# Patient Record
Sex: Female | Born: 1945 | Race: White | Hispanic: No | Marital: Single | Smoking: Former smoker
Health system: Southern US, Community
[De-identification: ages and names within clinical notes are randomized; demographics above are authoritative.]

## PROBLEM LIST (undated history)

## (undated) ENCOUNTER — Ambulatory Visit: Admission: EM | Payer: PPO | Source: Home / Self Care

## (undated) DIAGNOSIS — N3945 Continuous leakage: Secondary | ICD-10-CM

## (undated) DIAGNOSIS — C44310 Basal cell carcinoma of skin of unspecified parts of face: Secondary | ICD-10-CM

## (undated) DIAGNOSIS — E7800 Pure hypercholesterolemia, unspecified: Secondary | ICD-10-CM

## (undated) DIAGNOSIS — K219 Gastro-esophageal reflux disease without esophagitis: Secondary | ICD-10-CM

## (undated) DIAGNOSIS — H3530 Unspecified macular degeneration: Secondary | ICD-10-CM

## (undated) DIAGNOSIS — D649 Anemia, unspecified: Secondary | ICD-10-CM

## (undated) DIAGNOSIS — F502 Bulimia nervosa, unspecified: Secondary | ICD-10-CM

## (undated) DIAGNOSIS — M545 Low back pain, unspecified: Secondary | ICD-10-CM

## (undated) DIAGNOSIS — N183 Chronic kidney disease, stage 3 (moderate): Secondary | ICD-10-CM

## (undated) DIAGNOSIS — M1990 Unspecified osteoarthritis, unspecified site: Secondary | ICD-10-CM

## (undated) DIAGNOSIS — M810 Age-related osteoporosis without current pathological fracture: Secondary | ICD-10-CM

## (undated) DIAGNOSIS — Z972 Presence of dental prosthetic device (complete) (partial): Secondary | ICD-10-CM

## (undated) DIAGNOSIS — Z87891 Personal history of nicotine dependence: Principal | ICD-10-CM

## (undated) DIAGNOSIS — F332 Major depressive disorder, recurrent severe without psychotic features: Secondary | ICD-10-CM

## (undated) HISTORY — DX: Low back pain: M54.5

## (undated) HISTORY — DX: Bulimia nervosa: F50.2

## (undated) HISTORY — DX: Low back pain, unspecified: M54.50

## (undated) HISTORY — PX: TUBAL LIGATION: SHX77

## (undated) HISTORY — PX: CATARACT EXTRACTION: SUR2

## (undated) HISTORY — DX: Continuous leakage: N39.45

## (undated) HISTORY — DX: Age-related osteoporosis without current pathological fracture: M81.0

## (undated) HISTORY — DX: Basal cell carcinoma of skin of unspecified parts of face: C44.310

## (undated) HISTORY — DX: Personal history of nicotine dependence: Z87.891

## (undated) HISTORY — DX: Chronic kidney disease, stage 3 (moderate): N18.3

## (undated) HISTORY — DX: Unspecified macular degeneration: H35.30

## (undated) HISTORY — PX: CARPAL TUNNEL RELEASE: SHX101

## (undated) HISTORY — DX: Bulimia nervosa, unspecified: F50.20

## (undated) HISTORY — DX: Major depressive disorder, recurrent severe without psychotic features: F33.2

---

## 1975-09-29 HISTORY — PX: GASTRIC BYPASS: SHX52

## 1977-09-28 HISTORY — PX: SURGICAL EXCISION OF EXCESSIVE SKIN: SHX6542

## 1978-09-28 HISTORY — PX: COLON SURGERY: SHX602

## 2005-11-15 LAB — HM COLONOSCOPY

## 2008-09-28 DIAGNOSIS — N183 Chronic kidney disease, stage 3 unspecified: Secondary | ICD-10-CM

## 2008-09-28 HISTORY — DX: Chronic kidney disease, stage 3 unspecified: N18.30

## 2009-03-11 ENCOUNTER — Ambulatory Visit: Payer: Self-pay | Admitting: *Deleted

## 2009-07-30 ENCOUNTER — Ambulatory Visit: Payer: Self-pay | Admitting: Internal Medicine

## 2009-07-30 DIAGNOSIS — N3945 Continuous leakage: Secondary | ICD-10-CM

## 2009-07-30 DIAGNOSIS — R32 Unspecified urinary incontinence: Secondary | ICD-10-CM | POA: Insufficient documentation

## 2009-07-30 DIAGNOSIS — K219 Gastro-esophageal reflux disease without esophagitis: Secondary | ICD-10-CM | POA: Insufficient documentation

## 2009-07-30 DIAGNOSIS — F331 Major depressive disorder, recurrent, moderate: Secondary | ICD-10-CM | POA: Insufficient documentation

## 2009-07-30 DIAGNOSIS — N183 Chronic kidney disease, stage 3 (moderate): Secondary | ICD-10-CM

## 2009-07-30 DIAGNOSIS — F502 Bulimia nervosa: Secondary | ICD-10-CM

## 2009-07-30 DIAGNOSIS — M810 Age-related osteoporosis without current pathological fracture: Secondary | ICD-10-CM | POA: Insufficient documentation

## 2009-07-30 DIAGNOSIS — M545 Low back pain: Secondary | ICD-10-CM

## 2009-07-30 HISTORY — DX: Continuous leakage: N39.45

## 2009-08-01 LAB — CONVERTED CEMR LAB
BUN: 19 mg/dL (ref 6–23)
CO2: 27 meq/L (ref 19–32)
Calcium: 9.5 mg/dL (ref 8.4–10.5)
Creatinine, Ser: 0.92 mg/dL (ref 0.40–1.20)
Glucose, Bld: 80 mg/dL (ref 70–99)

## 2009-09-19 ENCOUNTER — Encounter: Payer: Self-pay | Admitting: Internal Medicine

## 2009-10-10 ENCOUNTER — Telehealth (INDEPENDENT_AMBULATORY_CARE_PROVIDER_SITE_OTHER): Payer: Self-pay | Admitting: *Deleted

## 2009-10-10 ENCOUNTER — Encounter: Payer: Self-pay | Admitting: *Deleted

## 2009-10-16 ENCOUNTER — Ambulatory Visit: Payer: Self-pay | Admitting: Internal Medicine

## 2009-11-18 ENCOUNTER — Telehealth: Payer: Self-pay | Admitting: Internal Medicine

## 2010-03-03 ENCOUNTER — Encounter: Payer: Self-pay | Admitting: Internal Medicine

## 2010-03-19 ENCOUNTER — Ambulatory Visit: Payer: Self-pay | Admitting: Internal Medicine

## 2010-04-15 ENCOUNTER — Ambulatory Visit: Payer: Self-pay | Admitting: Internal Medicine

## 2010-04-24 ENCOUNTER — Ambulatory Visit: Payer: Self-pay | Admitting: Internal Medicine

## 2010-04-29 ENCOUNTER — Ambulatory Visit: Payer: Self-pay | Admitting: Internal Medicine

## 2010-04-29 LAB — CONVERTED CEMR LAB
ALT: 17 units/L (ref 0–35)
AST: 25 units/L (ref 0–37)
Alkaline Phosphatase: 84 units/L (ref 39–117)
Chloride: 104 meq/L (ref 96–112)
Creatinine, Ser: 0.84 mg/dL (ref 0.40–1.20)
Total Bilirubin: 0.3 mg/dL (ref 0.3–1.2)

## 2010-05-13 ENCOUNTER — Ambulatory Visit: Payer: Self-pay | Admitting: Internal Medicine

## 2010-06-13 ENCOUNTER — Telehealth: Payer: Self-pay | Admitting: Internal Medicine

## 2010-06-24 ENCOUNTER — Ambulatory Visit: Payer: Self-pay | Admitting: Internal Medicine

## 2010-09-17 ENCOUNTER — Telehealth: Payer: Self-pay | Admitting: Internal Medicine

## 2010-10-02 ENCOUNTER — Telehealth: Payer: Self-pay | Admitting: Internal Medicine

## 2010-10-02 ENCOUNTER — Inpatient Hospital Stay: Payer: Self-pay | Admitting: Internal Medicine

## 2010-10-21 ENCOUNTER — Telehealth: Payer: Self-pay | Admitting: Internal Medicine

## 2010-10-21 ENCOUNTER — Inpatient Hospital Stay: Payer: Self-pay | Admitting: Specialist

## 2010-10-23 ENCOUNTER — Telehealth: Payer: Self-pay | Admitting: *Deleted

## 2010-10-28 ENCOUNTER — Ambulatory Visit: Admit: 2010-10-28 | Payer: Self-pay | Admitting: Internal Medicine

## 2010-10-30 NOTE — Miscellaneous (Signed)
Summary: Orders Update  Clinical Lists Changes  Orders: Added new Referral order of Misc. Referral (Misc. Ref) - Signed er Dr.Woodyear

## 2010-10-30 NOTE — Assessment & Plan Note (Signed)
Summary: //kg   Vital Signs:  Patient profile:   65 year old female Height:      61 inches (154.94 cm) Weight:      95.5 pounds (43.41 kg) BMI:     18.11 Pulse rate:   80 / minute BP sitting:   113 / 71  (left arm) Cuff size:   regular  Vitals Entered By: Cynda Familia Duncan Dull) (June 24, 2010 11:28 AM)  Have you ever been in a relationship where you felt threatened, hurt or afraid?No   Does patient need assistance? Functional Status Self care Ambulation Normal   Primary Care Provider:  Zoila Shutter MD   History of Present Illness: 65 yr old pt. well known to me who comes in for follow up of depression, bulemia, and significant situation stressors. As per last telephone conversation, her sister continues to be terminally ill and each of the times that she has gone over there and gotten reinvoled in has been quites detrimental to her health. The first time her weight had dropped to 87 pounds and her bulemia was the most out of control I have seen it in 8 yrs of taking care of her. Her weight has stabilized at a low95.5 for now.  However just 1 1/2 weeks ago she thought about drinking, and has been in recovery for years, and after we talked she went to her cousin's in [place] for the week-end. Now she comes in today saying she is having panic attacks like she has not had since her "mean" alcoholic brother died years ago. She says she "is not doing well at all" today. She is surprised her weight is not down today, because it has been a struggle to keep food down.  We just went up on the prozac to 60mg  a day a little over a week ago.  Current Medications (verified): 1)  Fluoxetine Hcl 40 Mg Caps (Fluoxetine Hcl) .... One A Day 2)  Omeprazole 40 Mg Cpdr (Omeprazole) .... Take 1 Tablet By Mouth Once A Day 3)  Tramadol Hcl 50 Mg Tabs (Tramadol Hcl) .Marland Kitchen.. 1-2 Tablets As Needed 4 Times A Day 4)  Trazodone Hcl 50 Mg Tabs (Trazodone Hcl) .... Take 1 Tablet By Mouth Once A Day 5)   Citracal Plus  Tabs (Multiple Minerals-Vitamins) .... Take 3 Tablets By Mouth Once A Day 6)  Polyethylene Glycol 3350  Powd (Polyethylene Glycol 3350) .... Mix 17 Gms in 8 Ounces of Water and Drink Once A Day 7)  Reclast 5 Mg/193ml Soln (Zoledronic Acid) 8)  Fluoxetine Hcl 20 Mg Caps (Fluoxetine Hcl) .... Take One A Day in Addition To The 40mg  Capsule  Allergies (verified): No Known Drug Allergies  Past History:  Past Medical History: Reviewed history from 07/30/2009 and no changes required. Osteoporosis-followed by Dr. Patrecia Pace in [place], has received infusions which have helped greatly Renal insufficiency/Chronic kidney disease-seen by Dr. Val Eagle at [facility], now followed by Dr. Lanora Manis Dehrer and [facility] [phone] Bulemia Major depression GERD  Social History: Retired Single Current Smoker Regular exercise-yes alcohol-none, in recovery bulemic, drinks ensure or boost to keep down calories Marital Status: Married Children:  Occupation:   Review of Systems General:  Complains of fatigue and malaise; denies sleep disorder and weight loss. CV:  Denies chest pain or discomfort and palpitations. Resp:  Denies cough and shortness of breath. GI:  Denies diarrhea, nausea, and vomiting; except for her bulemia.   Impression & Recommendations:  Problem # 1:  DEPRESSIVE DISORDER (ICD-311)  Greater than 40 minutes spent with Meagan Zavala with >50% spent face-to-face with counseling discussing how important it is for her to take care of herself and once again the fact that it is imperative that she stay away from the family dysfunction that has been so detrimental to her health in so many ways. She does have support from her cousin, [name], and some people in her church. She i also busy, working. Plan is for her to call me in 1 week to let me know how she is doing and to follow up here in 2 weeks. Her updated medication list for this problem includes:    Fluoxetine  Hcl 40 Mg Caps (Fluoxetine hcl) ..... One a day    Trazodone Hcl 50 Mg Tabs (Trazodone hcl) .Marland Kitchen... Take 1 tablet by mouth once a day    Fluoxetine Hcl 20 Mg Caps (Fluoxetine hcl) .Marland Kitchen... Take one a day in addition to the 40mg  capsule  Problem # 2:  BULIMIA (ICD-307.51) Again she continues to drink boost that she is not able to bring back up.  Complete Medication List: 1)  Fluoxetine Hcl 40 Mg Caps (Fluoxetine hcl) .... One a day 2)  Omeprazole 40 Mg Cpdr (Omeprazole) .... Take 1 tablet by mouth once a day 3)  Tramadol Hcl 50 Mg Tabs (Tramadol hcl) .Marland Kitchen.. 1-2 tablets as needed 4 times a day 4)  Trazodone Hcl 50 Mg Tabs (Trazodone hcl) .... Take 1 tablet by mouth once a day 5)  Citracal Plus Tabs (Multiple minerals-vitamins) .... Take 3 tablets by mouth once a day 6)  Polyethylene Glycol 3350 Powd (Polyethylene glycol 3350) .... Mix 17 gms in 8 ounces of water and drink once a day 7)  Reclast 5 Mg/132ml Soln (Zoledronic acid) 8)  Fluoxetine Hcl 20 Mg Caps (Fluoxetine hcl) .... Take one a day in addition to the 40mg  capsule  Patient Instructions: 1)  Please schedule a follow-up appointment in 2 weeks. 2)  Choose to take care of yourself.

## 2010-10-30 NOTE — Assessment & Plan Note (Signed)
Summary: est-overbook ok, pt coming at 11am//kg   Vital Signs:  Patient profile:   65 year old female Height:      61 inches (154.94 cm) Weight:      89.5 pounds Temp:     98.3 degrees F (36.83 degrees C) oral Pulse rate:   79 / minute BP sitting:   110 / 66  (left arm) Cuff size:   small  Vitals Entered By: Cynda Familia Duncan Dull) (April 24, 2010 11:31 AM)  CC: Depression Is Patient Diabetic? No Pain Assessment Patient in pain? no      Nutritional Status BMI of < 19 = underweight  Have you ever been in a relationship where you felt threatened, hurt or afraid?No   Does patient need assistance? Functional Status Self care Ambulation Normal   Primary Care Provider:  Zoila Shutter MD  CC:  Depression.  History of Present Illness: 65 year old well known to me who comes in for 1 week follow up. Last week she came in doing poorly  having lost weight and gotten emotionally emeshed back into her unhealthy dysfunctional family dynamics. When she left the plan was for her to start drinking Boost shakes which she is unable to binge back up, to remove herself from her family situation, and to follow up daily with her nephew, [name], who is her closest and healthiest relative.  She did well with this until 3 days ago when she developed an allergic reaction to ? (possibly a Reclast injection at Dr. Caroline More office 2 1/2 weeks ago), and she has been in bed and hardly taken in anything since then. Her reaction has affected mainly her face. 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 She denies significant  SOB or trouble swallowing. She did have a similiar allegic reaction 4 years ago or so and saw an allergist and nothing was done, so that is why she didn't call sooner. In the past she was on albuterol and prednisone at one time for a respiratory infection and felt she couldn't tolerate it,  but we both think it was more the albuterol that she was unable to tolerate. She has taken prednisone for poison oak in the past without problem.   Depression History:      The patient is having a depressed mood most of the day but denies diminished interest in her usual daily activities.        Preventive Screening-Counseling & Management  Alcohol-Tobacco     Alcohol drinks/day: 0     Smoking Status: current     Smoking Cessation Counseling: yes     Packs/Day: 10 cigs/day     Year Started: restarted x 7 yrs  Current Medications (verified): 1)  Fluoxetine Hcl 40 Mg Caps (Fluoxetine Hcl) .... One A Day 2)  Omeprazole 40 Mg Cpdr (Omeprazole) .... Take 1 Tablet By Mouth Once A Day 3)  Tramadol Hcl 50 Mg Tabs (Tramadol Hcl) .Marland Kitchen.. 1-2 Tablets As Needed 4 Times A Day 4)  Trazodone Hcl 50 Mg Tabs (Trazodone Hcl) .... Take 1 Tablet By Mouth Once A Day 5)  Citracal Plus  Tabs (Multiple Minerals-Vitamins) .... Take 3 Tablets By Mouth Once A Day 6)  Polyethylene Glycol 3350  Powd (Polyethylene Glycol 3350) .... Mix 17 Gms in 8 Ounces of Water and Drink Once A Day 7)  Reclast 5 Mg/165ml Soln (Zoledronic Acid) 8)  Prednisone  10 Mg Tabs (Prednisone) .... 4 Pills Together With Food For One Day, Then 2 Pills A Day Wilth Food in Am For 3 Days, Then 1 Pill A Day in Am With Food For 3days  Allergies (verified): No Known Drug Allergies  Past History:  Past Medical History: Reviewed history from 07/30/2009 and no changes required. Osteoporosis-followed by Dr. Patrecia Pace in [place], has received infusions which have helped greatly Renal insufficiency/Chronic kidney disease-seen by Dr. Val Eagle at [facility], now followed by Dr. Lanora Manis Dehrer and [facility] [phone] Bulemia Major depression GERD  Social History: Reviewed history from 07/30/2009 and no changes required. Retired Single Current Smoker Regular exercise-yes alcohol-none, in recovery bulemic, drinks ensure or boost  to keep down calories  Review of Systems       as per HPI  Physical Exam  General:  alert and underweight appearing. despite the swelling in her face she looks a bit better than last week   Eyes:  vision grossly intact.  swelling of the upper eyelids with fluid Mouth:  no laryngeal or uveal edema, no narrowing or oropharynx Lungs:  normal respiratory effort and normal breath sounds.   Heart:  normal rate, regular rhythm, and no murmur.   Abdomen:  soft, non-tender, and normal bowel sounds.   Msk:  kyphotic Extremities:  no edema Skin:  skin on face with macular papular rash that is confluent in the preauricular area, some erythema on the anterior neck as well Psych:  depressed affect.     Impression & Recommendations:  Problem # 1:  BULIMIA (ICD-307.51) I continue to be extremely concerned about Meagan Zavala's bulemia. She has lost 8 more pounds down to 89 pounds. She did drink 3cans of Boost a day until she developed this allergic reaction. Hopefully her weight was still going down, and now has plateaued and will start climbing. I want to make sure her electrolyes are ok and check what her nutritional status is by checking an albumin. I will see her back in a week. Orders: T-Comprehensive Metabolic Panel [id number]) T-Magnesium [phone])  Problem # 2:  DERMATITIS, ALLERGIC (ICD-692.9) I have put her on a lower dose taper so it will hopefully be better tolerated. She is going to stop by Dr. Gregery Na office today and check with them for how often they see drug eruption with Reclast and if this far out. Her updated medication list for this problem includes:    Prednisone 10 Mg Tabs (Prednisone) .Marland KitchenMarland KitchenMarland KitchenMarland Kitchen 4 pills together with food for one day, then 2 pills a day wilth food in am for 3 days, then 1 pill a day in am with food for 3days  Problem # 3:  DEPRESSIVE DISORDER (ICD-311) Continue current dose of medications. Her updated medication list for this problem includes:    Fluoxetine  Hcl 40 Mg Caps (Fluoxetine hcl) ..... One a day    Trazodone Hcl 50 Mg Tabs (Trazodone hcl) .Marland Kitchen... Take 1 tablet by mouth once a day  Problem # 4:  KIDNEY DISEASE, CHRONIC, STAGE III (ICD-585.3) I am concerned as she has had very little by mouth intake in the last few days which has not helped her kidneys. Will check Cr today. Orders: T-Comprehensive Metabolic Panel ([id number])  Complete Medication List: 1)  Fluoxetine Hcl 40 Mg Caps (Fluoxetine hcl) .... One a day 2)  Omeprazole 40 Mg Cpdr (Omeprazole) .... Take 1 tablet by mouth once a day 3)  Tramadol Hcl 50 Mg Tabs (Tramadol hcl) .Marland Kitchen.. 1-2 tablets as needed 4 times a  day 4)  Trazodone Hcl 50 Mg Tabs (Trazodone hcl) .... Take 1 tablet by mouth once a day 5)  Citracal Plus Tabs (Multiple minerals-vitamins) .... Take 3 tablets by mouth once a day 6)  Polyethylene Glycol 3350 Powd (Polyethylene glycol 3350) .... Mix 17 gms in 8 ounces of water and drink once a day 7)  Reclast 5 Mg/133ml Soln (Zoledronic acid) 8)  Prednisone 10 Mg Tabs (Prednisone) .... 4 pills together with food for one day, then 2 pills a day wilth food in am for 3 days, then 1 pill a day in am with food for 3days   Patient Instructions: 1)  Please schedule a follow-up appointment in 1 week. 2)  Keep up the good work with drinking the Boost! 3)  Please try to keep in touch with [facility] daily. Prescriptions: PREDNISONE 10 MG TABS (PREDNISONE) 4 pills together with food for one day, then 2 pills a day wilth food in AM for 3 days, then 1 pill a day in AM with food for 3days  #11 x 0   Entered and Authorized by:   Zoila Shutter MD   Signed by:   Zoila Shutter MD on 04/24/2010   Method used:   Electronically to        [facility] (retail)       [address], [address]       [place], [place]  [address]       Ph: [phone]       Fax: [phone]   RxID:   [id number]  Process Orders Tests Sent for requisitioning  (April 24, 2010 7:27 PM):     04/24/2010: [facility] -- T-Comprehensive Metabolic Panel [[id number]] (signed)     04/24/2010: [facility] -- T-Magnesium [[id number]] (signed)    Prevention & Chronic Care Immunizations   Influenza vaccine: Not documented    Tetanus booster: Not documented    Pneumococcal vaccine: Not documented    H. zoster vaccine: Not documented  Colorectal Screening   Hemoccult: Not documented    Colonoscopy: Not documented  Other Screening   Pap smear: Not documented    Mammogram: Not documented    DXA bone density scan: Not documented   Smoking status: current  (04/24/2010)   Smoking cessation counseling: yes  (04/24/2010)  Lipids   Total Cholesterol: Not documented   LDL: Not documented   LDL Direct: Not documented   HDL: Not documented   Triglycerides: Not documented    Process Orders Tests Sent for requisitioning (April 24, 2010 7:27 PM):     04/24/2010: [facility] -- T-Comprehensive Metabolic Panel [[id number]] (signed)     04/24/2010: [facility] -- T-Magnesium [[id number]] (signed)

## 2010-10-30 NOTE — Progress Notes (Signed)
Summary: ED asap/ hla  Phone Note Other Incoming   Summary of Call: HHN calls and states pt is confused, speech is garbled, short of breath, HHN is instructed to call 911 or make sure pt gets to ED asap Initial call taken by: Marin Roberts RN,  October 21, 2010 2:12 PM  Follow-up for Phone Call        agree Follow-up by: Julaine Fusi  DO,  October 21, 2010 4:53 PM

## 2010-10-30 NOTE — Progress Notes (Signed)
Summary: OVERBOOKED APPT FOR DR. Coralee Pesa  ---- Converted from flag ---- ---- 10/09/2009 1:54 PM, Shon Hough wrote: I finally located this patient's DOB and MRN.  I was able to contact her and left her a message to come in per your request on 10/16/2009 at 1:30pm to see you.  ---- 10/09/2009 1:54 PM, Shon Hough wrote:   ---- 10/09/2009 12:14 PM, Shon Hough wrote: I will be glad to sch her this appointment, but I need for you to send this flag with the chart attached so that I can get the MRN # and DOB.  ---- 10/08/2009 11:09 PM, Zoila Shutter MD wrote: I just spoke with this pt. Can you please double book her for next week-10/16/09 at 1:30pm and call her to confirm it. Thank you! ------------------------------

## 2010-10-30 NOTE — Progress Notes (Signed)
Summary: phone/gg  **  Phone Note From Other Clinic   Caller: Nurse Summary of Call: Call from Dr Patrecia Pace 's nurse.  They are seeing for  for osteoporisis @ [facility]. During today's visit pt had hard time walking, needed wheelchair and states it was hard to breath.  They checked pulse Ox and it was 80 % !!  They wanted pt to be seen in our clinic today. Pt was seen last week for sinus infection @ [facility].  I advised pt be sent to ED in [facility]  now for evaluation of respiratory problems.  Nurse voices understanding  Initial call taken by: Merrie Roof RN,  October 02, 2010 11:26 AM  Follow-up for Phone Call        Agree with ER eval  Follow-up by: Blanch Media MD,  October 02, 2010 11:28 AM

## 2010-10-30 NOTE — Assessment & Plan Note (Signed)
Summary: EST-CK/FU/MEDS/CFB   Vital Signs:  Patient profile:   65 year old female Height:      61 inches (154.94 cm) Weight:      101.5 pounds (46.14 kg) BMI:     19.25 Temp:     97.5 degrees F (36.39 degrees C) oral Pulse rate:   77 / minute BP sitting:   112 / 67  (left arm) Cuff size:   regular  Vitals Entered By: Cynda Familia Duncan Dull) (March 19, 2010 3:17 PM) CC: med refill, depression (multiple stressors), discuss kidney disease Is Patient Diabetic? No Pain Assessment Patient in pain? no      Nutritional Status BMI of 19 -24 = normal  Have you ever been in a relationship where you felt threatened, hurt or afraid?No   Does patient need assistance? Functional Status Self care Ambulation Normal   Primary Care Provider:  Zoila Shutter MD  CC:  med refill, depression (multiple stressors), and discuss kidney disease.  History of Present Illness: 65 year old patient well known to me who comes in for follow up and with several concerns.  The main thing today is her depression. Her sister who she has not been close to has just been diagnosed with lung cancer that has spread at least to the mediastinum. Zoriah says that it has really gotten to her, maybe because it is her last surviving, ? first degree, relative. She has been crying alot, and she has lost weight and she is having trouble with her bulemia with keeping anything down.  As well she just saw Dr. Johny Chess, her osteoporosis specialist, who just did another bone density and said that her osteoporosis was "very bad." Therefore he is starting her on Reclast, vitamin D 50,000u a week, and what sounds like calcitonin. This has upset her as well.  She has had Cr checked both at the nephrologist and Dr. Johny Chess and I will scan in the exact values but they are both around 1.02!  She is just on Trazadone for sleep now and that is working fine.  Depression History:      The patient is having a depressed mood most of the  day but denies diminished interest in her usual daily activities.        The patient denies that she has thought about ending her life.        Comments:  sister just diagnosed with cancer, car in the shop, multiple stressors.   Preventive Screening-Counseling & Management  Alcohol-Tobacco     Alcohol drinks/day: 0     Smoking Status: current     Smoking Cessation Counseling: yes     Packs/Day: 10 cigs/day     Year Started: restarted x 7 yrs  Current Medications (verified): 1)  Fluoxetine Hcl 40 Mg Caps (Fluoxetine Hcl) .... One A Day 2)  Omeprazole 40 Mg Cpdr (Omeprazole) .... Take 1 Tablet By Mouth Once A Day 3)  Tramadol Hcl 50 Mg Tabs (Tramadol Hcl) .Marland Kitchen.. 1-2 Tablets As Needed 4 Times A Day 4)  Trazodone Hcl 50 Mg Tabs (Trazodone Hcl) .... Take 1 Tablet By Mouth Once A Day 5)  Citracal Plus  Tabs (Multiple Minerals-Vitamins) .... Take 3 Tablets By Mouth Once A Day 6)  Polyethylene Glycol 3350  Powd (Polyethylene Glycol 3350) .... Mix 17 Gms in 8 Ounces of Water and Drink Once A Day 7)  Reclast 5 Mg/154ml Soln (Zoledronic Acid)  Allergies (verified): No Known Drug Allergies  Past History:  Past Medical History:  Reviewed history from 07/30/2009 and no changes required. Osteoporosis-followed by Dr. Patrecia Pace in [place], has received infusions which have helped greatly Renal insufficiency/Chronic kidney disease-seen by Dr. Val Eagle at [facility], now followed by Dr. Lanora Manis Dehrer and [facility] [phone] Bulemia Major depression GERD  Past Surgical History: Reviewed history from 07/30/2009 and no changes required. Remote hx of gastric bypass surgery  Social History: Reviewed history from 07/30/2009 and no changes required. Retired Single Current Smoker Regular exercise-yes alcohol-none, in recovery bulemic, drinks ensure or boost to keep down calories  Review of Systems       as per HPI  Physical Exam  General:  alert and underweight appearing.    Head:  normocephalic and atraumatic.   Eyes:  vision grossly intact.   Neck:  supple and full ROM.   Lungs:  normal respiratory effort and normal breath sounds.   Heart:  normal rate, regular rhythm, and no murmur.   Abdomen:  soft, non-tender, and normal bowel sounds.   Extremities:  no edema Psych:  Oriented X3, memory intact for recent and remote, and dysphoric affect.     Impression & Recommendations:  Problem # 1:  DEPRESSIVE DISORDER (ICD-311) We talked about options for treatment. I will increase the fluoxetine to 40mg  a day. She will return for follow up in 3weeks.  Her updated medication list for this problem includes:    Fluoxetine Hcl 40 Mg Caps (Fluoxetine hcl) ..... One a day    Trazodone Hcl 50 Mg Tabs (Trazodone hcl) .Marland Kitchen... Take 1 tablet by mouth once a day  Problem # 2:  BULIMIA (ICD-307.51) In the past she has taken in ensure or boost which she is unable to get back up with her bulemia. She is afraid of gaining weight again, but she knows that she has no reserve right now as well. So she will try this.  Problem # 3:  KIDNEY DISEASE, CHRONIC, STAGE III (ICD-585.3) Cr has been good and stable!  Problem # 4:  OSTEOPOROSIS (ICD-733.00)  Her updated medication list for this problem includes:    Reclast 5 Mg/155ml Soln (Zoledronic acid)  Complete Medication List: 1)  Fluoxetine Hcl 40 Mg Caps (Fluoxetine hcl) .... One a day 2)  Omeprazole 40 Mg Cpdr (Omeprazole) .... Take 1 tablet by mouth once a day 3)  Tramadol Hcl 50 Mg Tabs (Tramadol hcl) .Marland Kitchen.. 1-2 tablets as needed 4 times a day 4)  Trazodone Hcl 50 Mg Tabs (Trazodone hcl) .... Take 1 tablet by mouth once a day 5)  Citracal Plus Tabs (Multiple minerals-vitamins) .... Take 3 tablets by mouth once a day 6)  Polyethylene Glycol 3350 Powd (Polyethylene glycol 3350) .... Mix 17 gms in 8 ounces of water and drink once a day 7)  Reclast 5 Mg/142ml Soln (Zoledronic acid)  Patient Instructions: 1)  Please schedule a  follow-up appointment in 3- 4 weeks.  2)  We have increased the Prozac to 40mg  a day. 3)  Take care of yourself!!! Prescriptions: FLUOXETINE HCL 40 MG CAPS (FLUOXETINE HCL) one a day  #31 x 11   Entered and Authorized by:   Zoila Shutter MD   Signed by:   Zoila Shutter MD on 03/19/2010   Method used:   Electronically to        [facility] [address] (retail)       [address]       [place], [place]  [address]  Ph: [phone]       Fax: [phone]   RxID:   [id number]    Prevention & Chronic Care Immunizations   Influenza vaccine: Not documented    Tetanus booster: Not documented    Pneumococcal vaccine: Not documented    H. zoster vaccine: Not documented  Colorectal Screening   Hemoccult: Not documented    Colonoscopy: Not documented  Other Screening   Pap smear: Not documented    Mammogram: Not documented    DXA bone density scan: Not documented   Smoking status: current  (03/19/2010)   Smoking cessation counseling: yes  (03/19/2010)  Lipids   Total Cholesterol: Not documented   LDL: Not documented   LDL Direct: Not documented   HDL: Not documented   Triglycerides: Not documented

## 2010-10-30 NOTE — Progress Notes (Signed)
Summary: "Nerves Bad"//kg  Phone Note Call from Patient   Caller: Patient Call For: Zoila Shutter MD Reason for Call: Privacy/Consent Authorization Summary of Call: Call from pt requesting fluoxetine increase.  States her nerves are not doing to well.  Doesnt feel like she needs to leave the house today.  Would like for MD to give her a call.Cynda Familia [facility])  June 13, 2010 9:41 AM   Follow-up for Phone Call        I spoke with Liborio Nixon at length. She has been spending a lot of time caring for her terminal sister and gotten back involved with the family dysfunction. Unfortunately this has brought her very low. She even thought about going to the liquor store yesterday and she has been sober for years. We talked at length about a plan to keep her safe and sober and care for her. She is going to go see her supportive cousin, [name], in [place], if he will be home this weekend. We have agreed to go up to 60mg  on her fluoxetine. She is sleeping with the trazadone. She says her weight has been stable at 95 pounds. She does not want to come in today as she does not want to go out of the house, unless to go out of town. She is scheduled to come in Tuesday 06/17/10. She is to page me back later today with her definitive plans. Follow-up by: Zoila Shutter MD,  June 13, 2010 10:19 AM    New/Updated Medications: FLUOXETINE HCL 20 MG CAPS (FLUOXETINE HCL) take one a day in addition to the 40mg  capsule Prescriptions: FLUOXETINE HCL 20 MG CAPS (FLUOXETINE HCL) take one a day in addition to the 40mg  capsule  #31 x 6   Entered and Authorized by:   Zoila Shutter MD   Signed by:   Zoila Shutter MD on 06/13/2010   Method used:   Electronically to        [facility] [address] (retail)       [address], [address]       [place], [place]  [address]       Ph: [phone]       Fax: [phone]   RxID:   [id number]

## 2010-10-30 NOTE — Assessment & Plan Note (Signed)
Summary: EST-OVERBOOK PER DR Zamiah Tollett/CFB   Vital Signs:  Patient profile:   65 year old female Height:      61 inches (154.94 cm) Temp:     97.1 degrees F oral Pulse rate:   67 / minute BP sitting:   103 / 70  (right arm)  Vitals Entered By: Chinita Pester RN (October 16, 2009 1:53 PM) CC: F/u visit. Medication for her bladder is not working. Is Patient Diabetic? No Pain Assessment Patient in pain? no      Nutritional Status Detail Pt. does not want to be weighed.  Have you ever been in a relationship where you felt threatened, hurt or afraid?No   Does patient need assistance? Functional Status Self care Ambulation Normal   Primary Care Provider:  Zoila Shutter MD  CC:  F/u visit. Medication for her bladder is not working.Marland Kitchen  History of Present Illness: 65 yr old well known to me who comes in for follow-up. I spoke to her a few days ago. The oxybutinin that I had prescribed she had not tolerated. It had casued her to dry out totally. She had constipation, dry mouth etc. We stopped this and started Mirilax and her constipation has resolved.  I referred her to urogynecology for the incontinence, at [facility].  Pt. is not depressed now.   She brings in 2 Cr today-12/23: .90, 11/29: 1.02. Her endocrinologist, Dr. Patrecia Pace, sees her for osteoporosis every 6 months.  Pt. has lost some weight. Didn't get on scale. She is making herself keep fish down! This is a milestone as far as her bulemia.  She is taking Temazepam every other night for sleep, alternating with Trazadone. She wonders if she can come off of the Temazepam as they both work equally well and she does not want to be on anything addicting. She is worried about Trazadone being addicting. I told her today that it is not and that she can take it daily if needed with no problem.     Depression History:      The patient denies a depressed mood most of the day and a diminished interest in her usual daily activities.      Preventive Screening-Counseling & Management  Alcohol-Tobacco     Alcohol drinks/day: 0     Smoking Status: current     Smoking Cessation Counseling: yes     Packs/Day: 10 cigs/day     Year Started: restarted x 7 yrs  Caffeine-Diet-Exercise     Does Patient Exercise: yes     Type of exercise: walking     Times/week: 7  Current Medications (verified): 1)  Fluoxetine Hcl 20 Mg Tabs (Fluoxetine Hcl) .Marland Kitchen.. 1 A Day 2)  Omeprazole 40 Mg Cpdr (Omeprazole) .... Take 1 Tablet By Mouth Once A Day 3)  Temazepam 30 Mg Caps (Temazepam) .... Take One Tablet By Mouth Every Other Day 4)  Tramadol Hcl 50 Mg Tabs (Tramadol Hcl) .Marland Kitchen.. 1-2 Tablets As Needed 4 Times A Day 5)  Trazodone Hcl 50 Mg Tabs (Trazodone Hcl) .... Take 1 Tablet By Mouth Once A Day 6)  Citracal Plus  Tabs (Multiple Minerals-Vitamins) .... Take 3 Tablets By Mouth Once A Day 7)  Polyethylene Glycol 3350  Powd (Polyethylene Glycol 3350) .... Mix 17 Gms in 8 Ounces of Water and Drink Once A Day  Allergies (verified): No Known Drug Allergies  Social History: Reviewed history from 07/30/2009 and no changes required. Retired Single Current Smoker Regular exercise-yes alcohol-none, in recovery bulemic, drinks  ensure or boost to keep down calories  Review of Systems General:  Denies chills, fatigue, fever, and sleep disorder. CV:  Denies chest pain or discomfort and shortness of breath with exertion. Resp:  Denies cough and shortness of breath. GI:  Denies abdominal pain, nausea, and vomiting. MS:  Complains of low back pain; chronic, takes tramadol as needed, has not taken any NSAIDs since CRI!Marland Kitchen Neuro:  Denies headaches, memory loss, and numbness. Psych:  Denies depression.  Physical Exam  Eyes:  vision grossly intact.   Neck:  supple and no masses.   Lungs:  normal breath sounds.   Heart:  normal rate, regular rhythm, and no murmur.   Abdomen:  non-tender and normal bowel sounds.   Extremities:  no  edema   Impression & Recommendations:  Problem # 1:  LEAKAGE, CONTINUOUS URINE (ICD-788.37) Pt. did not tolerate oxybutinin. She has an appt. scheduled at [facility] at their urogynecology clinic.  Problem # 2:  KIDNEY DISEASE, CHRONIC, STAGE III (ICD-585.3) Her Cr has been checked on the outside and has been excellent. She has continued off of NSAIDs and as needed Lasix which has made a big difference. She does not need labs today.  Problem # 3:  DEPRESSIVE DISORDER (ICD-311) This is in remission on current regimen and will continue. Her updated medication list for this problem includes:    Fluoxetine Hcl 20 Mg Tabs (Fluoxetine hcl) .Marland Kitchen... 1 a day    Trazodone Hcl 50 Mg Tabs (Trazodone hcl) .Marland Kitchen... Take 1 tablet by mouth once a day  Problem # 4:  BULIMIA (ICD-307.51) Ms. 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  Problem # 5:  BACK PAIN (ICD-724.5)  Her updated medication list for this problem includes:    Tramadol Hcl 50 Mg Tabs (Tramadol hcl) .Marland Kitchen... 1-2 tablets as needed 4 times a day  Henya will return to the clinic in 3 months.  Complete Medication List: 1)  Fluoxetine Hcl 20 Mg Tabs (Fluoxetine hcl) .Marland Kitchen.. 1 a day 2)  Omeprazole 40 Mg Cpdr (Omeprazole) .... Take 1 tablet by mouth once a day 3)  Temazepam 30 Mg Caps (Temazepam) .... Take one tablet by mouth every other day 4)  Tramadol Hcl 50 Mg Tabs (Tramadol hcl) .Marland Kitchen.. 1-2 tablets as needed 4 times a day 5)  Trazodone Hcl 50 Mg Tabs (Trazodone hcl) .... Take 1 tablet by mouth once a day 6)  Citracal Plus Tabs (Multiple minerals-vitamins) .... Take 3 tablets by mouth once a day 7)  Polyethylene Glycol 3350 Powd (Polyethylene glycol 3350) .... Mix 17 gms in 8 ounces of water and drink once a day  Patient Instructions: 1)  Please schedule a follow-up appointment in 3 months.  Prevention & Chronic Care Immunizations   Influenza vaccine: Not documented    Tetanus booster: Not documented    Pneumococcal vaccine:  Not documented    H. zoster vaccine: Not documented  Colorectal Screening   Hemoccult: Not documented    Colonoscopy: Not documented  Other Screening   Pap smear: Not documented    Mammogram: Not documented    DXA bone density scan: Not documented   Smoking status: current  (10/16/2009)   Smoking cessation counseling: yes  (10/16/2009)  Lipids   Total Cholesterol: Not documented   LDL: Not documented   LDL Direct: Not documented   HDL: Not documented   Triglycerides: Not documented

## 2010-10-30 NOTE — Progress Notes (Signed)
Summary: appt request//kg  Phone Note Call from Patient   Caller: Patient Call For: [phone] Summary of Call: Phone message left by patient stating she needs to see Dr Coralee Pesa   Follow-up for Phone Call        10/28/10, plese double book 3:45pm. thanks. Follow-up by: Zoila Shutter MD,  October 24, 2010 3:58 PM  Additional Follow-up for Phone Call Additional follow up Details #1::        Pt informed of appt time and date.Cynda Familia [facility])  October 25, 2010 9:31 AM

## 2010-10-30 NOTE — Assessment & Plan Note (Signed)
Summary: OK TO OB PER Dany Harten/KAYE PT COMING @1115AM /CH   Vital Signs:  Patient profile:   65 year old female Height:      61 inches (154.94 cm) Weight:      97.0 pounds (44.09 kg) BMI:     18.39 Temp:     98.0 degrees F (36.67 degrees C) oral Pulse rate:   88 / minute BP sitting:   119 / 72  (right arm)  Vitals Entered By: Cynda Familia [facility]) (April 15, 2010 11:48 AM) CC: f/u, not sure if meds is working because she's going through alot with her sister being in the hospital, Depression Is Patient Diabetic? No Pain Assessment Patient in pain? no      Nutritional Status BMI of < 19 = underweight  Have you ever been in a relationship where you felt threatened, hurt or afraid?No   Does patient need assistance? Functional Status Self care Ambulation Normal   Primary Care Provider:  Zoila Shutter MD  CC:  f/u, not sure if meds is working because she's going through alot with her sister being in the hospital, and Depression.  History of Present Illness: 65 year old well known to me who comes in for one month follow up of depression, weight loss and bulemia. I increased Prozac from 20->40mg  a day the last time she was here.  Unfortunately she has lost 4 more pounds down to 97 pounds, and has not been eating much, or supplementing her diet with ensure/boost which she keeps down.  She has gotten involved in family dysfunction with her sisters rapid decline with metastic small cell lung cancer and this has brought her to a bad place emotionallly and physically.  She denies suicidal ideation.  Depression History:      The patient denies a depressed mood most of the day and a diminished interest in her usual daily activities.        Comments:  Pt states she has good and bad days.   Preventive Screening-Counseling & Management  Alcohol-Tobacco     Alcohol drinks/day: 0     Smoking Status: current     Smoking Cessation Counseling: yes     Packs/Day: 10  cigs/day     Year Started: restarted x 7 yrs  Current Medications (verified): 1)  Fluoxetine Hcl 40 Mg Caps (Fluoxetine Hcl) .... One A Day 2)  Omeprazole 40 Mg Cpdr (Omeprazole) .... Take 1 Tablet By Mouth Once A Day 3)  Tramadol Hcl 50 Mg Tabs (Tramadol Hcl) .Marland Kitchen.. 1-2 Tablets As Needed 4 Times A Day 4)  Trazodone Hcl 50 Mg Tabs (Trazodone Hcl) .... Take 1 Tablet By Mouth Once A Day 5)  Citracal Plus  Tabs (Multiple Minerals-Vitamins) .... Take 3 Tablets By Mouth Once A Day 6)  Polyethylene Glycol 3350  Powd (Polyethylene Glycol 3350) .... Mix 17 Gms in 8 Ounces of Water and Drink Once A Day 7)  Reclast 5 Mg/157ml Soln (Zoledronic Acid)  Allergies (verified): No Known Drug Allergies  Past History:  Past Medical History: Reviewed history from 07/30/2009 and no changes required. Osteoporosis-followed by Dr. Patrecia Pace in [place], has received infusions which have helped greatly Renal insufficiency/Chronic kidney disease-[facility] by Dr. Val Eagle [facility], now followed by Dr. Lanora Manis Dehrer and [facility] [phone] Bulemia Major depression GERD  Past Surgical History: Reviewed history from 07/30/2009 and no changes required. Remote hx of gastric bypass surgery  Social History: Reviewed history from 07/30/2009 and no changes required. Retired Single Current  Smoker Regular exercise-yes alcohol-none, in recovery bulemic, drinks ensure or boost to keep down calories  Review of Systems       weakness, fatigue, malaise, depression  Physical Exam  General:  underweight appearing, cachetic, and pale. Meagan Zavala  looks the worst I have ever seen her in 8 years.  Eyes:  vision grossly intact.   Lungs:  normal respiratory effort.   Heart:  normal rate.   Psych:  Oriented X3, memory intact for recent and remote, and depressed affect.     Impression & Recommendations:  Problem # 1:  DEPRESSIVE DISORDER (ICD-311) Greater than 25 minutes spent with greater than 50%  spent face to face with counseling discussing that it is IMPERATIVE that patient 1. get ensure/boost supplements today and start drinking them, and 2. remove herself from the family situation that is presently costing her her health. I think this is a life and death matter for her and she is in agreement with this as we discussed it at length. She has a very supportive nephew, [name], and she plans to call him and contract with him about these 2 things and also for safety. Meagan Zavala has dealt with this bulemia for many years and overcome it and I believe she will now. She is to return to see me in one week. We talked about hospitalization if she did not improve, which she does not want to do, and I don't think will be neccessary. But she will need close follow up in the near future. i will continue current dose of fluoxetine at this point. Her updated medication list for this problem includes:    Fluoxetine Hcl 40 Mg Caps (Fluoxetine hcl) ..... One a day    Trazodone Hcl 50 Mg Tabs (Trazodone hcl) .Marland Kitchen... Take 1 tablet by mouth once a day  Problem # 2:  BULIMIA (ICD-307.51) See Assessment #1.  Complete Medication List: 1)  Fluoxetine Hcl 40 Mg Caps (Fluoxetine hcl) .... One a day 2)  Omeprazole 40 Mg Cpdr (Omeprazole) .... Take 1 tablet by mouth once a day 3)  Tramadol Hcl 50 Mg Tabs (Tramadol hcl) .Marland Kitchen.. 1-2 tablets as needed 4 times a day 4)  Trazodone Hcl 50 Mg Tabs (Trazodone hcl) .... Take 1 tablet by mouth once a day 5)  Citracal Plus Tabs (Multiple minerals-vitamins) .... Take 3 tablets by mouth once a day 6)  Polyethylene Glycol 3350 Powd (Polyethylene glycol 3350) .... Mix 17 gms in 8 ounces of water and drink once a day 7)  Reclast 5 Mg/19ml Soln (Zoledronic acid)   Prevention & Chronic Care Immunizations   Influenza vaccine: Not documented    Tetanus booster: Not documented    Pneumococcal vaccine: Not documented    H. zoster vaccine: Not documented  Colorectal Screening    Hemoccult: Not documented    Colonoscopy: Not documented  Other Screening   Pap smear: Not documented    Mammogram: Not documented    DXA bone density scan: Not documented   Smoking status: current  (04/15/2010)   Smoking cessation counseling: yes  (04/15/2010)  Lipids   Total Cholesterol: Not documented   LDL: Not documented   LDL Direct: Not documented   HDL: Not documented   Triglycerides: Not documented

## 2010-10-30 NOTE — Progress Notes (Signed)
Summary: med refill and appt//kg  Phone Note Call from Patient Call back at [phone]   Refills Requested: Medication #1:  FLUOXETINE HCL 20 MG CAPS take 3 capsules once a day.   Dosage confirmed as above?Dosage Confirmed   Supply Requested: 3 months Caller: Patient Call For: Zoila Shutter MD Reason for Call: Refill Medication Summary of Call: Call from pt requesting a refill on her fluoxetine 20 mg tabs.  Pt requests a supply to be called into [facility] in [place] ([address] ph#[phone].  Per pharmacy,  pt last had the 20mg  tabs filled on 09/09/10 # 30.  The 40mg  tabs were last filled on 08/02/10 #90.  Pt did state that she had some 40mg  tabs on hand, but she will be running out the 20mg  tabs.  Pt takes a total of 60mg  daily. Her insurance will be changing the first of the year and  she will be using a mail order company.  She has requested an appt to be seen early next year.  As no appts are available at this time, I will forward request to her PCP this afternoon for advice and med refill.Cynda Familia [facility])  September 17, 2010 9:32 AM  Initial call taken by: Cynda Familia Duncan Dull),  September 17, 2010 9:32 AM    Prescriptions: FLUOXETINE HCL 20 MG CAPS (FLUOXETINE HCL) take 3 capsules once a day  #180 x 1   Entered and Authorized by:   Zoila Shutter MD   Signed by:   Zoila Shutter MD on 09/18/2010   Method used:   Electronically to        [facility] (retail)       [address], [address]       [place], [place]  [address]       Ph: [phone]       Fax: [phone]   RxID:   [id number]

## 2010-10-30 NOTE — Progress Notes (Signed)
Summary: med refill/gp  Phone Note Refill Request Message from:  Patient on November 18, 2009 10:46 AM  Refills Requested: Medication #1:  TRAZODONE HCL 50 MG TABS Take 1 tablet by mouth once a day Stated  she stopped taking Temazepam.  Needs a 3 month  refill on Trazadone.   Method Requested: Telephone to Pharmacy Initial call taken by: Chinita Pester RN,  November 18, 2009 10:46 AM  Follow-up for Phone Call        Rx called to pharmacy. Follow-up by: Chinita Pester RN,  November 18, 2009 4:41 PM    Prescriptions: TRAZODONE HCL 50 MG TABS (TRAZODONE HCL) Take 1 tablet by mouth once a day  #90 x 4   Entered and Authorized by:   Zoila Shutter MD   Signed by:   Zoila Shutter MD on 11/18/2009   Method used:   Electronically to        [facility] [address] (retail)       [address]       [place], [place]  [address]       Ph: [phone]       Fax: [phone]   RxID:   [id number]

## 2010-10-30 NOTE — Assessment & Plan Note (Signed)
Summary: 1115 AM APPT 2 WEEK RECHECK OK TO OB PER WOODYEAR/CH   Vital Signs:  Patient profile:   65 year old female Height:      61 inches (154.94 cm) Weight:      95.6 pounds (43.45 kg) BMI:     18.13 Pulse rate:   70 / minute BP sitting:   119 / 65  (left arm) Cuff size:   regular  Vitals Entered By: Cynda Familia Duncan Dull) (May 13, 2010 11:20 AM) CC: 2wk rck Is Patient Diabetic? No Nutritional Status BMI of < 19 = underweight  Have you ever been in a relationship where you felt threatened, hurt or afraid?No   Does patient need assistance? Functional Status Self care Ambulation Normal   Primary Care Kirke Breach:  Zoila Shutter MD  CC:  2wk rck.  History of Present Illness: 65 yr old who comes in for 2 week follow up. She is doing much better. She has maintained her weight. She is drinking at least 3 boost a day. She looks great. She is emotionally distancing herself from her  family.  She does not  feel depressed. She needs a 90 day supple of the fluoxetine and a refill of 90 days on her other scripts.    Preventive Screening-Counseling & Management  Alcohol-Tobacco     Alcohol drinks/day: 0     Smoking Status: current     Smoking Cessation Counseling: yes     Packs/Day: 10 cigs/day     Year Started: restarted x 7 yrs  Current Medications (verified): 1)  Fluoxetine Hcl 40 Mg Caps (Fluoxetine Hcl) .... One A Day 2)  Omeprazole 40 Mg Cpdr (Omeprazole) .... Take 1 Tablet By Mouth Once A Day 3)  Tramadol Hcl 50 Mg Tabs (Tramadol Hcl) .Marland Kitchen.. 1-2 Tablets As Needed 4 Times A Day 4)  Trazodone Hcl 50 Mg Tabs (Trazodone Hcl) .... Take 1 Tablet By Mouth Once A Day 5)  Citracal Plus  Tabs (Multiple Minerals-Vitamins) .... Take 3 Tablets By Mouth Once A Day 6)  Polyethylene Glycol 3350  Powd (Polyethylene Glycol 3350) .... Mix 17 Gms in 8 Ounces of Water and Drink Once A Day 7)  Reclast 5 Mg/183ml Soln (Zoledronic Acid)  Allergies (verified): No Known Drug  Allergies  Past History:  Past Medical History: Reviewed history from 07/30/2009 and no changes required. Osteoporosis-followed by Dr. Patrecia Pace in [place], has received infusions which have helped greatly Renal insufficiency/Chronic kidney disease-seen by Dr. Val Eagle at [facility], now followed by Dr. Lanora Manis Dehrer and [facility] [phone] Bulemia Major depression GERD  Social History: Reviewed history from 07/30/2009 and no changes required. Retired Single Current Smoker Regular exercise-yes alcohol-none, in recovery bulemic, drinks ensure or boost to keep down calories  Physical Exam  General:  alert.  looks 100% better than when I saw her 1 month ago! Head:  normocephalic and atraumatic.   Eyes:  vision grossly intact.   Neck:  supple.   Lungs:  normal respiratory effort and normal breath sounds.   Heart:  normal rate, regular rhythm, and no murmur.   Abdomen:  soft, non-tender, and normal bowel sounds.   Extremities:  no edema Psych:  she looks great, mood good   Impression & Recommendations:  Problem # 1:  BULIMIA (ICD-307.51) Stable, maintaining weight. She is drinking at least 3 boost a day. I will see her back in 1 month. I am very pleased with her progress.  Problem # 2:  DEPRESSIVE DISORDER (ICD-311) Doing well.  Medications refilled. Her updated medication list for this problem includes:    Fluoxetine Hcl 40 Mg Caps (Fluoxetine hcl) ..... One a day    Trazodone Hcl 50 Mg Tabs (Trazodone hcl) .Marland Kitchen... Take 1 tablet by mouth once a day  Complete Medication List: 1)  Fluoxetine Hcl 40 Mg Caps (Fluoxetine hcl) .... One a day 2)  Omeprazole 40 Mg Cpdr (Omeprazole) .... Take 1 tablet by mouth once a day 3)  Tramadol Hcl 50 Mg Tabs (Tramadol hcl) .Marland Kitchen.. 1-2 tablets as needed 4 times a day 4)  Trazodone Hcl 50 Mg Tabs (Trazodone hcl) .... Take 1 tablet by mouth once a day 5)  Citracal Plus Tabs (Multiple minerals-vitamins) .... Take 3 tablets by  mouth once a day 6)  Polyethylene Glycol 3350 Powd (Polyethylene glycol 3350) .... Mix 17 gms in 8 ounces of water and drink once a day 7)  Reclast 5 Mg/179ml Soln (Zoledronic acid)  Patient Instructions: 1)  Please schedule a follow-up appointment in 1 month. Prescriptions: TRAZODONE HCL 50 MG TABS (TRAZODONE HCL) Take 1 tablet by mouth once a day  #90 x 4   Entered and Authorized by:   Zoila Shutter MD   Signed by:   Zoila Shutter MD on 05/13/2010   Method used:   Electronically to        [facility] [address] (retail)       [address], [address]       [place], [place]  [address]       Ph: [phone]       Fax: [phone]   RxID:   [id number] TRAMADOL HCL 50 MG TABS (TRAMADOL HCL) 1-2 tablets as needed 4 times a day  #90 x 4   Entered and Authorized by:   Zoila Shutter MD   Signed by:   Zoila Shutter MD on 05/13/2010   Method used:   Electronically to        [facility] [address] (retail)       [address], [address]       [place], [place]  [address]       Ph: [phone]       Fax: [phone]   RxID:   [id number] OMEPRAZOLE 40 MG CPDR (OMEPRAZOLE) Take 1 tablet by mouth once a day  #93 x 4   Entered and Authorized by:   Zoila Shutter MD   Signed by:   Zoila Shutter MD on 05/13/2010   Method used:   Electronically to        [facility] [address] (retail)       [address], [address]       [place], [place]  [address]       Ph: [phone]       Fax: [phone]   RxID:   [id number] FLUOXETINE HCL 40 MG CAPS (FLUOXETINE HCL) one a day  #93 x 4   Entered and Authorized by:   Zoila Shutter MD   Signed by:   Zoila Shutter MD on 05/13/2010   Method used:   Electronically to        [facility] [address] (retail)       [address]       [place]  [place], [place]  [address]       Ph: [phone]        Fax: [phone]   RxID:   [id number]   Prevention & Chronic Care Immunizations   Influenza vaccine: Not documented    Tetanus booster: Not documented    Pneumococcal vaccine: Not documented    H. zoster vaccine: Not documented  Colorectal Screening   Hemoccult: Not documented    Colonoscopy: Not documented  Other Screening   Pap smear: Not documented    Mammogram: Not documented    DXA bone density scan: Not documented   Smoking status: current  (05/13/2010)   Smoking cessation counseling: yes  (05/13/2010)  Lipids   Total Cholesterol: Not documented   LDL: Not documented   LDL Direct: Not documented   HDL: Not documented   Triglycerides: Not documented

## 2010-10-30 NOTE — Assessment & Plan Note (Signed)
Summary: pt arrive 11:15am   Vital Signs:  Patient profile:   65 year old female Height:      61 inches (154.94 cm) Weight:      95.3 pounds (43.32 kg) BMI:     18.07 Temp:     98.3 degrees F (36.83 degrees C) oral Pulse rate:   85 / minute BP sitting:   141 / 77  (left arm) Cuff size:    regular  Vitals Entered By: Cynda Familia Duncan Dull) (April 29, 2010 11:17 AM) CC: 1wk reck Is Patient Diabetic? No Pain Assessment Patient in pain? no      Nutritional Status BMI of < 19 = underweight  Have you ever been in a relationship where you felt threatened, hurt or afraid?No   Does patient need assistance? Functional Status Self care Ambulation Normal   Primary Care Provider:  Zoila Shutter MD  CC:  1wk reck.  History of Present Illness: 65 year old who comes in for 1 week follow up. The reason she is back so soon is because her weight had dropped to 89 pounds last week. She has had bulemia for many decades but in the 8 years I have cared for her her weight has never fallen below 95 or so.   She has been very adversely affected by a sister who has been diagnosed with lung camcer who she had been estranged from and getting re"entangled" in the family dysfunction.  She comes in better this week. She is drinking 3-4 Boost a day, and her weight is up 6 pounds. She has however spent the last 4 days at her sisters house which is again starting to affect her physically and emotionally.  Her allergic reaction on her face has resolved. She has tolerated the prednisone.  Unfortunately she has developed over the last 2 days 'cobwebs' in her R eye and she is going to see an opthalmologist today. She also says that where her nose should be is black.  Preventive Screening-Counseling & Management  Alcohol-Tobacco     Alcohol drinks/day: 0     Smoking Status: current     Smoking Cessation Counseling: yes     Packs/Day: 10 cigs/day     Year Started: restarted x 7 yrs  Current  Medications (verified): 1)  Fluoxetine Hcl 40 Mg Caps (Fluoxetine Hcl) .... One A Day 2)  Omeprazole 40 Mg Cpdr (Omeprazole) .... Take 1 Tablet By Mouth Once A Day 3)  Tramadol Hcl 50 Mg Tabs (Tramadol Hcl) .Marland Kitchen.. 1-2 Tablets As Needed 4 Times A Day 4)  Trazodone Hcl 50 Mg Tabs (Trazodone Hcl) .... Take 1 Tablet By Mouth Once A Day 5)  Citracal Plus  Tabs (Multiple Minerals-Vitamins) .... Take 3 Tablets By Mouth Once A Day 6)  Polyethylene Glycol 3350  Powd (Polyethylene Glycol 3350) .... Mix 17 Gms in 8 Ounces of Water and Drink Once A Day 7)  Reclast 5 Mg/179ml Soln (Zoledronic Acid) 8)  Prednisone 10 Mg Tabs (Prednisone) .... 4 Pills Together With Food For One Day, Then 2 Pills A Day Wilth Food in Am For 3 Days, Then 1 Pill A Day in Am With Food For 3days  Allergies (verified): No Known Drug Allergies  Review of Systems       as per HPI  Physical Exam  General:  alert.  she looks better today, less cachectic though still very thin Head:  normocephalic and atraumatic.   Lungs:  normal respiratory effort and normal breath sounds.  Heart:  normal rate, regular rhythm, and no murmur.   Abdomen:  soft, non-tender, and normal bowel sounds.   Extremities:  no edema    Impression & Recommendations:  Problem # 1:  BULIMIA (ICD-307.51) By seeing her weekly we have been able to keep her out of the hospital. I reviewed her labs with her which were surprisingly good, including an albumin of 4.0. She promises to keep drinking 3-4 Boost shakes a day. I have also counselled her today at length about getting back involved in her extended family. Again I think that interaction can be life or death for her. She will follow up in 2 weeks.  Problem # 2:  DEPRESSIVE DISORDER (ICD-311) Her depression is better and she is sleeping. Continue present medications. Her updated medication list for this problem includes:    Fluoxetine Hcl 40 Mg Caps (Fluoxetine hcl) ..... One a day    Trazodone Hcl 50 Mg  Tabs (Trazodone hcl) .Marland Kitchen... Take 1 tablet by mouth once a day  Problem # 3:  DERMATITIS, ALLERGIC (ICD-692.9) Resolved with treatment. Etiology unclear, but most likely etiology reclast. Her updated medication list for this problem includes:    Prednisone 10 Mg Tabs (Prednisone) .Marland KitchenMarland KitchenMarland KitchenMarland Kitchen 4 pills together with food for one day, then 2 pills a day wilth food in am for 3 days, then 1 pill a day in am with food for 3days  Complete Medication List: 1)  Fluoxetine Hcl 40 Mg Caps (Fluoxetine hcl) .... One a day 2)  Omeprazole 40 Mg Cpdr (Omeprazole) .... Take 1 tablet by mouth once a day 3)  Tramadol Hcl 50 Mg Tabs (Tramadol hcl) .Marland Kitchen.. 1-2 tablets as needed 4 times a day 4)  Trazodone Hcl 50 Mg Tabs (Trazodone hcl) .... Take 1 tablet by mouth once a day 5)  Citracal Plus Tabs (Multiple minerals-vitamins) .... Take 3 tablets by mouth once a day 6)  Polyethylene Glycol 3350 Powd (Polyethylene glycol 3350) .... Mix 17 gms in 8 ounces of water and drink once a day 7)  Reclast 5 Mg/168ml Soln (Zoledronic acid) 8)  Prednisone 10 Mg Tabs (Prednisone) .... 4 pills together with food for one day, then 2 pills a day wilth food in am for 3 days, then 1 pill a day in am with food for 3days  Patient Instructions: 1)  Please schedule a follow-up appointment in 2 weeks.   Prevention & Chronic Care Immunizations   Influenza vaccine: Not documented    Tetanus booster: Not documented    Pneumococcal vaccine: Not documented    H. zoster vaccine: Not documented  Colorectal Screening   Hemoccult: Not documented    Colonoscopy: Not documented  Other Screening   Pap smear: Not documented    Mammogram: Not documented    DXA bone density scan: Not documented   Smoking status: current  (04/29/2010)   Smoking cessation counseling: yes  (04/29/2010)  Lipids   Total Cholesterol: Not documented   LDL: Not documented   LDL Direct: Not documented   HDL: Not documented   Triglycerides: Not documented

## 2010-11-14 ENCOUNTER — Encounter: Payer: Self-pay | Admitting: Internal Medicine

## 2010-11-14 DIAGNOSIS — F502 Bulimia nervosa: Secondary | ICD-10-CM

## 2010-11-14 DIAGNOSIS — F332 Major depressive disorder, recurrent severe without psychotic features: Secondary | ICD-10-CM

## 2010-11-14 DIAGNOSIS — M545 Low back pain: Secondary | ICD-10-CM

## 2010-11-14 DIAGNOSIS — N3945 Continuous leakage: Secondary | ICD-10-CM

## 2010-11-14 DIAGNOSIS — M810 Age-related osteoporosis without current pathological fracture: Secondary | ICD-10-CM

## 2010-11-18 ENCOUNTER — Ambulatory Visit (INDEPENDENT_AMBULATORY_CARE_PROVIDER_SITE_OTHER): Payer: Medicare Other | Admitting: Internal Medicine

## 2010-11-18 ENCOUNTER — Encounter: Payer: Self-pay | Admitting: Internal Medicine

## 2010-11-18 ENCOUNTER — Ambulatory Visit (HOSPITAL_COMMUNITY)
Admission: RE | Admit: 2010-11-18 | Discharge: 2010-11-18 | Disposition: A | Discharge status: Home / Self Care | Payer: Medicare Other | Source: Ambulatory Visit | Attending: Internal Medicine | Admitting: Internal Medicine

## 2010-11-18 VITALS — BP 99/68 | HR 88 | Temp 97.0°F | Wt 100.8 lb

## 2010-11-18 DIAGNOSIS — F332 Major depressive disorder, recurrent severe without psychotic features: Secondary | ICD-10-CM

## 2010-11-18 DIAGNOSIS — J189 Pneumonia, unspecified organism: Secondary | ICD-10-CM | POA: Insufficient documentation

## 2010-11-18 DIAGNOSIS — R4182 Altered mental status, unspecified: Secondary | ICD-10-CM

## 2010-11-18 DIAGNOSIS — G3184 Mild cognitive impairment, so stated: Secondary | ICD-10-CM | POA: Insufficient documentation

## 2010-11-18 DIAGNOSIS — M949 Disorder of cartilage, unspecified: Secondary | ICD-10-CM | POA: Insufficient documentation

## 2010-11-18 DIAGNOSIS — M899 Disorder of bone, unspecified: Secondary | ICD-10-CM | POA: Insufficient documentation

## 2010-11-18 DIAGNOSIS — Z09 Encounter for follow-up examination after completed treatment for conditions other than malignant neoplasm: Secondary | ICD-10-CM | POA: Insufficient documentation

## 2010-11-18 DIAGNOSIS — F502 Bulimia nervosa: Secondary | ICD-10-CM

## 2010-11-18 LAB — CBC WITH DIFFERENTIAL/PLATELET
Basophils Absolute: 0 10*3/uL (ref 0.0–0.1)
Basophils Relative: 1 % (ref 0–1)
Eosinophils Absolute: 0.1 10*3/uL (ref 0.0–0.7)
Hemoglobin: 12.2 g/dL (ref 12.0–15.0)
MCH: 29.8 pg (ref 26.0–34.0)
MCHC: 32.1 g/dL (ref 30.0–36.0)
Monocytes Absolute: 0.5 10*3/uL (ref 0.1–1.0)
Monocytes Relative: 9 % (ref 3–12)
Neutro Abs: 2.8 10*3/uL (ref 1.7–7.7)
Neutrophils Relative %: 53 % (ref 43–77)
RDW: 15.6 % — ABNORMAL HIGH (ref 11.5–15.5)

## 2010-11-18 LAB — COMPREHENSIVE METABOLIC PANEL
ALT: 19 U/L (ref 0–35)
AST: 23 U/L (ref 0–37)
Albumin: 3.8 g/dL (ref 3.5–5.2)
Alkaline Phosphatase: 73 U/L (ref 39–117)
Calcium: 9.3 mg/dL (ref 8.4–10.5)
Chloride: 103 mEq/L (ref 96–112)
Potassium: 4.2 mEq/L (ref 3.5–5.3)
Sodium: 140 mEq/L (ref 135–145)

## 2010-11-18 LAB — RPR

## 2010-11-18 LAB — TSH: TSH: 2.336 u[IU]/mL (ref 0.350–4.500)

## 2010-11-18 NOTE — Assessment & Plan Note (Signed)
See plan in changed mental status.

## 2010-11-18 NOTE — Assessment & Plan Note (Signed)
I spent greater than 110 minutes with >50% spent face to face with counseling and coordination of care trying to get patient history, discussing it with patient and her friend, [name], who was in the room, obtaining records from 2 different hospitals and 3 hospitalizations; speaking with her nephew who is her power of attorney.  I am very concerned about Kendyl and I am unclear as to the etiology of her mental status change which is severe. Pseudo-dementia due to depression is certainly a possibility but I do not think that she has been off of the fluoxetine long enough for this to have occurred-by the history that I am getting. Fluoxetine stays in the system for weeks. Though I have not seen her since 9/11 so I can not know for sure. There are many other toxins, possible overdoses, metabolic derangements and intoxications that have been ruled out by the work up done at [facility]. I will wait to obtain the records from [facility] and review them as well. She had also been very upset dealing with her family so some kind of psychiatric event needs to be considered. Her personality is very different. She was always very concerned about her weight and if shehad gained much because of her eating d/o, but today she was glad that she had gained weight.  For now I have ordered a brain MRI, a CXR that was done today and looks fine and some baseline labs.

## 2010-11-18 NOTE — Assessment & Plan Note (Signed)
See change in mental status for full plan. I am going to restart fluoxetine today. She is to take 20mg  a day for a week, and then will increase to 40mg  a day. I went over this with her friend. She often won't take meds so we will see if she will take them.

## 2010-11-18 NOTE — Progress Notes (Addendum)
Subjective:    Patient ID: Meagan Zavala, female    DOB: Mar 07, 1946, 65 y.o.   MRN: [id number]  HPI 65 yr old patient well known to me who was last seen 06/24/10. At that time she was under a lot of stress again because she had gotten back involved in the family dysfunction (she had been estranged from a very unhealthy family situation but then her sister was diagnosed with lung cancer) and it was again beginning to affect her health. She had even thought about drinking alcohol though she had been clean and sober for many years. Her bulemia was bad and she was again losing weight. At that time we had just increased her fluoxetine to 60mg  a day. When she left she was to call to let me know how she was doing in one week and to follow up in 2 weeks. [name] did not follow up with either of these visits and we were unable to get her on the phone. She did call in 07/08/10 and say she was doing well. Then we heard from her next 09/17/10 when she called for a prescription refill and requested an appt. For 1/12.  However since then she has been hospitalized 3 times and her mental status has drastically changed. It was not until her nephew, [name], called me last week that I was aware of any of this. I spent greater than 110 minutes with patient and her friend, [name], today trying to obtain history and gather information that would possibly help me understand the etiology of her change in mental status.  Apparently she went to Dr. Gregery Na office on 10/02/10 and was SOB, hypoxic 02 satn in the 80's, and unable to walk, and when our triage was called she was sent to [facility] ED. Apparently (I am still waiting for records) she was hospitalized there for 1 1/2 weeks with pneumonia. Per [name]'s account, it was during that admission that the fairly acute change in mental status occurred. There was not any acute event (code, hypoxia, etc.) that she was aware of. We also received a call from a home health agency on  10/21/10 that patient was SOB, confused, and her speech was garbled. At that time 911 was called and she was again admitted to [facility].  She was then admitted to [facility] for changed mental status and treatment of her bulemia. She was there 10/28/10-10/30/10. While there her w/up included a head CT that showed no acute change but did show " prominent periventricular white matter hypodensity c/w small vessel ischemia." CXR showed no focal disease and old compression fractures. Labs: TSH 1.4, B12 and folate nl, HIV neg, RPR-nr, Vit D-nl, alb. 2.9, pre-alb.-35, ASA-neg, acetominophen-neg, alcohol panel-neg. She was seen by psychiatry, but was not "interactive" in that interview and they thought "delirium" was most likely.  Patient did not tell them that she was being followed here for primary care and at least when she was hospitalized there her med list did not include fluoxetine. Looking through the list of meds that she brought with her on 09/23/10 she was prescribed a cephalosporin. On 10/16/10 she was given prednisone, furosemide and mirtazepine. On 10/24/10 she was given prednisone and levaquin. She does not have a clear memory of any of these visits to urgent care.  The history from her nephew and her friend now is that she will "not do anything." They broght me a list today of what she cannot do and it includes: writing, reading, almost all ADL's.  When her friend tries to help her to get a bath or change her clothes then most of the time she refuses and just continues sitting there.     Review of Systems  Constitutional: Positive for activity change, appetite change and fatigue. Negative for fever.  HENT: Negative for sore throat, neck pain and sinus pressure.   Respiratory: Negative for cough and shortness of breath.   Cardiovascular: Negative for chest pain and palpitations.  Gastrointestinal: Negative for nausea, vomiting and abdominal pain.  Genitourinary: Negative for difficulty urinating.    Musculoskeletal: Negative for back pain.  Neurological: Negative for weakness and headaches.  Psychiatric/Behavioral: Positive for confusion and dysphoric mood. Negative for suicidal ideas and sleep disturbance.  She is not worried about her weight which is VERY different for her.    Objective:   Physical Exam  Constitutional: She appears cachectic.  HENT:  Right Ear: Tympanic membrane normal.  Left Ear: Tympanic membrane normal.  Mouth/Throat: Oropharynx is clear and moist and mucous membranes are normal.  Neck: Full passive range of motion without pain. No muscular tenderness present.  Cardiovascular: Normal rate and regular rhythm.   Pulmonary/Chest: Effort normal and breath sounds normal.  Abdominal: Soft. Bowel sounds are normal. There is no tenderness.  Neurological: She is alert. She has normal strength.  Psychiatric: Cognition and memory are impaired. She exhibits a depressed mood.       Mini-mental status examination she scored 14 which is markedly impaired for her.   BP is low normal for her         Assessment & Plan:  Addendum: (11/20/10) Review of 2 admissions at [facility]. 1. 10/02/10-10/15/10: "Acute respiratory failure due to acute COPD exacerbation,"-though 02 satn was only down to 83% on admit and 92-93% on 2l. Also acute bronchitis, wbc 12.6, CT of chest no PE, "pneumonitis," 2D-echo LV nl size, EF >55%. Psychiatry was consulted and at that time she was still obsessed with her weight and did not want to change any of her psych meds, though they recommended adding marinol and remeron that were added, and trying to find inpt. Treatment for bulemia, possibly at [facility].  2. 10/21/10-10/24/10: Admitting note says "hypoxic encephalopathy" but an 02 is not documented on records I received. Discharge diagnoses include-COPD exacerbation, pneumonia, changed mental status, likely secondary to dehydration and underlying eating d/o.  I spoke with Ms. Bittinger nephew  [name] (her power of attorney as well) at length today. I am concerned that this probably will not be reversible and we will start the process of nursing home placement. I have consulted Dorothe Pea for assistance with this.   All labs and CXR are fine. The MRI of the brain is scheduled for next Tuesday after she comes back to see [place].

## 2010-11-18 NOTE — Patient Instructions (Signed)
Please take your fluoxetine. Take 1 20mg  capsule once a day for 1 week. Then start taking 1 40mg  capsule once a day.  Please do what [name] asks you to! She is trying to help you. Hang in there.

## 2010-11-18 NOTE — Assessment & Plan Note (Signed)
Apparently this was treated in the hospital in the beginning of January but has resolved on CXR today.

## 2010-11-25 ENCOUNTER — Ambulatory Visit (HOSPITAL_COMMUNITY)
Admission: RE | Admit: 2010-11-25 | Discharge: 2010-11-25 | Disposition: A | Discharge status: Home / Self Care | Payer: Medicare Other | Source: Ambulatory Visit | Attending: Internal Medicine | Admitting: Internal Medicine

## 2010-11-25 ENCOUNTER — Other Ambulatory Visit: Payer: Self-pay | Admitting: Internal Medicine

## 2010-11-25 ENCOUNTER — Encounter: Payer: Self-pay | Admitting: Internal Medicine

## 2010-11-25 ENCOUNTER — Encounter: Payer: Self-pay | Admitting: Licensed Clinical Social Worker

## 2010-11-25 ENCOUNTER — Ambulatory Visit (INDEPENDENT_AMBULATORY_CARE_PROVIDER_SITE_OTHER): Payer: Medicare Other | Admitting: Internal Medicine

## 2010-11-25 VITALS — BP 91/68 | HR 85 | Temp 97.4°F | Wt 102.0 lb

## 2010-11-25 DIAGNOSIS — R4182 Altered mental status, unspecified: Secondary | ICD-10-CM | POA: Insufficient documentation

## 2010-11-25 DIAGNOSIS — F502 Bulimia nervosa, unspecified: Secondary | ICD-10-CM

## 2010-11-25 DIAGNOSIS — F99 Mental disorder, not otherwise specified: Secondary | ICD-10-CM

## 2010-11-25 DIAGNOSIS — G4700 Insomnia, unspecified: Secondary | ICD-10-CM

## 2010-11-25 DIAGNOSIS — I6789 Other cerebrovascular disease: Secondary | ICD-10-CM | POA: Insufficient documentation

## 2010-11-25 NOTE — Progress Notes (Signed)
Soc. Work.  45 minutes.  This case was referred to Soc. Work by Dr. Coralee Pesa to review the medical/psychiatric history and two most recent  hospitalizations at [facility] in January for shortness of breath (COPD exacerbation) and confusion and then a admission to [facility] 10/28/10 -10/30/10 for altered mental status.  The patient has a lengthy history of bulimia dating back to the 1970's.   The friend and caregiver [name] has become stressed and overwhelmed in caring for the patient.   Met with patient and friend Bunnie Pion in exam room to establish relationship, assess for needs, and offer options.  I find a pleasant but frail and disheveled looking woman in the exam room vigorously eating a package of graham crackers.   Both the patient and Ms. Lewis report that patient's condition is considerably improved from last week.  The patient is much more oriented, is drinking coffee and eating better. Ms. Melvyn Neth reports that the patient can go through an entire box of [employer] crackers in two days.  They are clear that there have been no recent episodes of binging and purging however the patient reports eating icecream, pie, and that she loves crackers.  She has washed her hair and is sponge bathing, however [name] reports that patient is still in the same clothes from last week and will not get into the tub to bathe even though there is a shower chair inside the tub.  Upon asking the patient why she won't change her clothes or bathe, she tells me that she just doesn't want to lift her arms to change her clothes.  She also states that she's scared of falling when she takes a shower.  She tells me she is spot cleaning her body without removing her clothing.   Discussed with patient and [name] the importance of connecting with area mental health that will likely reduce hospitalizations and help manage her depression and eating disorders.  The patient did not seem receptive to that option and told me she has lost faith in  anyone being able to help her.   I did not discuss nursing home placement today because it will be difficult to place this patient in SNF without connection to outpatient psychiatry considering her long history of major depression and bulimia.   However, I did discuss with the patient, caregiver, and Dr. Coralee Pesa a referral to the [facility] in [facility] that will offer a full range of medical and supportive services. That was of interest to the patient and caregiver and so I will make that referral immediately.   Continued Soc. Work follow-up.

## 2010-11-25 NOTE — Progress Notes (Signed)
  Subjective:    Patient ID: Meagan Zavala, female    DOB: 02/08/1946, 65 y.o.   MRN: [id number]  HPIJanice comes in for 1 week follow up. She is doing a little better. She is accompanied by her dear friend Meagan Zavala who is currently caring for her. She did wake up one day in the last week and feel a good bit better. She seems more like herself today. She is worried about her weight today. She has not taken any medicine.     Review of Systems  Constitutional: Negative for fatigue.  Respiratory: Negative for shortness of breath.   Cardiovascular: Negative for chest pain.  Gastrointestinal: Negative for nausea, vomiting and abdominal pain.       Objective:   Physical Exam  Constitutional: She appears well-developed. No distress.  HENT:  Head: Normocephalic and atraumatic.  Cardiovascular: Normal rate, regular rhythm and normal heart sounds.   No murmur heard. Pulmonary/Chest: Breath sounds normal.  Abdominal: Soft. Bowel sounds are normal. There is no tenderness.  Musculoskeletal: Normal range of motion. She exhibits no edema.  Skin: Skin is warm and dry.  Psychiatric: She has a normal mood and affect.  she is disheveled again today which is not normal for her. Her personality seems to be returning a bit.         Assessment & Plan:

## 2010-11-25 NOTE — Assessment & Plan Note (Addendum)
The etiology of this is still not clear though I think the most likely scenario is underlying cerebral changes from decades of bulemia, with probable insult with hypoxia during respiratory illness and hospitalization in January 2012. MRI of the brain is scheduled for today. We met at length with Dorothe Pea today and will proceed with the PACE program or equivalent in [facility] at this point. This will get Meagan Zavala some support at home. They do have their own physicians so I will be happy to consult as needed. Beyounce is worried about me not be the person taking care of her but I have assured her I will follow with them and if the family or Meagan Zavala is concerned with care they can call me. She will follow up in 2 weeks. She has not been taking any of her medication but I have again discussed the importance of taking the fluoxetine and she is having insomnia and would like to restart the trazadone which I think will be fine.   I have reviewed all of outside records and spoken with Caly's nephew, [name], as well as discussed this situation as length with our Child psychotherapist. It is very complicated and unfortunately, though she has improved slightly, I am not sure what her new baseline will be.

## 2010-11-25 NOTE — Assessment & Plan Note (Signed)
Today patient was more like herself in that she was again concerned about her weight which is what she has always been. She will try to keep her weight stable as it is now.

## 2010-11-25 NOTE — Patient Instructions (Signed)
I will see you back in 2 weeks.

## 2010-11-26 ENCOUNTER — Telehealth: Payer: Self-pay | Admitting: Licensed Clinical Social Worker

## 2010-11-26 NOTE — Telephone Encounter (Signed)
I have called [employer] in [place] and given all referral information to Margot Ables intake coordinator [phone]) who will call the patient and caregiver to discuss PACE services.  Intake Coordinator will request records and she has my phone number for any other questions or concerns.   The patient is currently living with friend Bunnie Pion at [address] [place]  [phone]    [name]'s phone number is [phone] and the patient's cell number is [phone].

## 2010-12-09 ENCOUNTER — Telehealth: Payer: Self-pay | Admitting: *Deleted

## 2010-12-09 NOTE — Telephone Encounter (Signed)
Call from [facility] requesting to speak to Dr Coralee Pesa regarding patient.  States pt is considering driving and he doesn't feel like she should be at this time.  Also states he doesn't believe pt should return back to her home because of the excessive hoarding that pt was doing.  Inquired about neurology appt, which I had spoken with Dr Marlis Edelson nurse Diane yesterday and she is still working on getting an appt.  Will forward message to PCP.

## 2010-12-11 NOTE — Telephone Encounter (Signed)
I spoke with [name] at length and suggested that he get Ms. Kizzie Bane an appointment with Mental Health provider in [facility] as we had discussed previously. As well I told him that I think it is unsafe for her to drive or to live in her home alone. He is her power of attorney. As well I told him that she missed, ? Cancelled an over-booked appointment I had made for her to see me on 12/09/10.

## 2010-12-23 ENCOUNTER — Ambulatory Visit (INDEPENDENT_AMBULATORY_CARE_PROVIDER_SITE_OTHER): Payer: Medicare Other | Admitting: Internal Medicine

## 2010-12-23 VITALS — BP 141/77 | HR 92 | Temp 97.1°F | Wt 109.0 lb

## 2010-12-23 DIAGNOSIS — G4700 Insomnia, unspecified: Secondary | ICD-10-CM

## 2010-12-23 DIAGNOSIS — R4182 Altered mental status, unspecified: Secondary | ICD-10-CM

## 2010-12-23 DIAGNOSIS — N3945 Continuous leakage: Secondary | ICD-10-CM

## 2010-12-23 DIAGNOSIS — S139XXA Sprain of joints and ligaments of unspecified parts of neck, initial encounter: Secondary | ICD-10-CM

## 2010-12-23 DIAGNOSIS — F332 Major depressive disorder, recurrent severe without psychotic features: Secondary | ICD-10-CM

## 2010-12-23 DIAGNOSIS — S161XXA Strain of muscle, fascia and tendon at neck level, initial encounter: Secondary | ICD-10-CM | POA: Insufficient documentation

## 2010-12-23 DIAGNOSIS — F502 Bulimia nervosa: Secondary | ICD-10-CM

## 2010-12-23 NOTE — Assessment & Plan Note (Signed)
Patient is having some problems with this but does not want any medication at this point.

## 2010-12-23 NOTE — Assessment & Plan Note (Signed)
She says that she is sleeping well.

## 2010-12-23 NOTE — Assessment & Plan Note (Signed)
This is improving. I am not sure why but I am very glad. It certainly could be that she is back on fluoxetine. Patient plans to call Dr. Maryruth Bun who saw her while she was in the hospital and did a psychiatric consult on her. She has an appointment at Dr. Marlis Edelson office next week, although when he saw her MRI he felt this was more of a chronic process.

## 2010-12-23 NOTE — Patient Instructions (Signed)
Please take fluoxetine 40mg  a day. Please call and make an appointment with a psychiatrist. We have given you your appointment time with the neurologist. Keep up the good work. I am so glad that you are feeling better.

## 2010-12-23 NOTE — Progress Notes (Signed)
  Subjective:    Patient ID: Meagan Zavala, female    DOB: 03-Dec-1945, 65 y.o.   MRN: [id number]  HPIMs. [place] was worked in today after paging me yesterday c/o neck pain radiating up to the occipital area bilaterally. She actually says she is feeling better today. It "just feels like a crick in her neck now." This started about 6 days ago and was much worse though she cannot really describe why. She was afraid it was a disc but denies any arm symptoms. Tramadol didn't help at all. She has put heat on it. Since she was last in I spoke with her nephew and suggested he make an appointment for her to see psychiatry as we had discussed previously. He has not yet done that. She is wanting to go back to living in her own house and driving and I have told her that psychiatry will need to help with that decision. She does seem much better today, although she is confused about some things like her fluoxetine dose (?60 or 40mg ). She is back to cleaning a house a week, and she has been going over to her house every day and working on getting it cleaned up.    Review of Systems  Constitutional: Positive for appetite change. Negative for fatigue.  Respiratory: Negative for shortness of breath.   Cardiovascular: Negative for chest pain.  Gastrointestinal: Negative for abdominal distention.  she is eating better and is glad that she has gained weight.     Objective:   Physical Exam Patient looks much better today, she is nicely dressed and more to her old baseline Neck: no pain along the C-spine, minimal tenderness along the para-spinal muscles, R>L, feels better with massage of area, lungs-clear, heart-RRR, no murmur, abdomen-+BS, nontender, extrem-no edema       Assessment & Plan:

## 2010-12-23 NOTE — Assessment & Plan Note (Signed)
I will treat this supportively as it is better and with her change in mental status I do not want to put her on any medicines that are not absolutely necessary.

## 2010-12-23 NOTE — Assessment & Plan Note (Signed)
At this point Meagan Zavala seems to be eating and wanting to gain weight. Will continue to follow.

## 2010-12-23 NOTE — Assessment & Plan Note (Signed)
Patient is not sure what dose of fluoxetine that she is on. I have tried to get her to take 40mg .

## 2011-01-20 ENCOUNTER — Ambulatory Visit (INDEPENDENT_AMBULATORY_CARE_PROVIDER_SITE_OTHER): Payer: Medicare Other | Admitting: Internal Medicine

## 2011-01-20 ENCOUNTER — Encounter: Payer: Self-pay | Admitting: Internal Medicine

## 2011-01-20 ENCOUNTER — Other Ambulatory Visit: Payer: Self-pay | Admitting: *Deleted

## 2011-01-20 VITALS — BP 118/72 | HR 83 | Temp 98.1°F | Wt 113.7 lb

## 2011-01-20 DIAGNOSIS — S139XXA Sprain of joints and ligaments of unspecified parts of neck, initial encounter: Secondary | ICD-10-CM

## 2011-01-20 DIAGNOSIS — F332 Major depressive disorder, recurrent severe without psychotic features: Secondary | ICD-10-CM

## 2011-01-20 DIAGNOSIS — K219 Gastro-esophageal reflux disease without esophagitis: Secondary | ICD-10-CM

## 2011-01-20 DIAGNOSIS — R4182 Altered mental status, unspecified: Secondary | ICD-10-CM

## 2011-01-20 DIAGNOSIS — F502 Bulimia nervosa: Secondary | ICD-10-CM

## 2011-01-20 DIAGNOSIS — S161XXA Strain of muscle, fascia and tendon at neck level, initial encounter: Secondary | ICD-10-CM

## 2011-01-20 MED ORDER — TRAMADOL HCL 50 MG PO TABS: 50.0000 mg | ORAL_TABLET | Freq: Four times a day (QID) | ORAL | Status: DC | PRN

## 2011-01-20 MED ORDER — FLUOXETINE HCL 20 MG PO CAPS: 40.0000 mg | ORAL_CAPSULE | Freq: Every day | ORAL | Status: DC

## 2011-01-20 MED ORDER — TRAZODONE HCL 50 MG PO TABS: 50.0000 mg | ORAL_TABLET | Freq: Every day | ORAL | Status: DC

## 2011-01-20 MED ORDER — OMEPRAZOLE 40 MG PO CPDR: 40.0000 mg | DELAYED_RELEASE_CAPSULE | Freq: Every day | ORAL | Status: DC

## 2011-01-20 NOTE — Patient Instructions (Signed)
I will see you in 6 weeks.

## 2011-01-20 NOTE — Assessment & Plan Note (Signed)
This is better we'll continue supportive care.

## 2011-01-20 NOTE — Telephone Encounter (Deleted)
Pt stopped at front desk for check out and stated that she forgot to have MD refill her meds during the visit.  Will forward request to PCP.

## 2011-01-20 NOTE — Progress Notes (Signed)
  Subjective:    Patient ID: Meagan Zavala, female    DOB: 04-Jan-1946, 65 y.o.   MRN: [id number]  HPI Meagan Zavala returns today and her mental status has returned to her normal baseline. This is amazing. Apparently she went to see neurology on 12/30/2010 and was back to her baseline at this time. Just as this occurred after her 2 hospitalizations at home in [place] in January of 2012. Now has resolved. I believe the most likely etiology is long-standing bulimia and then an acute event involving both situational stress with her sister's illness and family dysfunction as well as medical illness involving probable COPD and respiratory infection and hypoxia.  I did get her to do a Mini-Mental Status exam today which was 14/30 when I saw her in February and today is 28/30. She is less obsessed about her weight and has not been bulimic about everything she appeared her weight is up to 113 today which she and I did not discuss the number today which is not been bulimic with everything she  is eating.She use to drink Ensure to keep something down and she instead has been obsessing actually about getting her house perfect.  She is driving today and plans to move back into her house once she gets up. She has not been in contact with her sister which is very detrimental for her. She has been getting support from her church family.    Review of Systems  Constitutional: Negative for fatigue.  Respiratory: Negative for shortness of breath.   Cardiovascular: Negative for chest pain.  Gastrointestinal: Negative for nausea, vomiting and abdominal pain.       Objective:   Physical Exam  HENT:  Head: Normocephalic and atraumatic.  Neck: Neck supple.  Cardiovascular: Normal rate and regular rhythm.   Pulmonary/Chest: Breath sounds normal.  Abdominal: Bowel sounds are normal.  Psychiatric: She has a normal mood and affect.          Assessment & Plan:

## 2011-01-20 NOTE — Assessment & Plan Note (Signed)
Meagan Zavala is doing well on current protectors. She is also sleeping well with trazodone 50 mg at night. We'll continue current medication.

## 2011-01-20 NOTE — Assessment & Plan Note (Signed)
Patient is back to her normal baseline today. As I discussed in the history of present illness likely this was most likely related to long-standing bulimia and insult that this has on her central nervous system. In addition to this there is acute insult with hypoxia and respiratory infection and stress surrounding her family situation. I am extremely pleased that it has resolved. I talked her length about the importance of self care and staying away from stressful situations.

## 2011-01-20 NOTE — Assessment & Plan Note (Signed)
At this time she does not seem as concerned about this. She'll continue to try to eat healthily and not obsess about her weight.

## 2011-01-20 NOTE — Assessment & Plan Note (Signed)
Continue proton pump inhibitor

## 2011-03-03 ENCOUNTER — Encounter: Payer: Self-pay | Admitting: Internal Medicine

## 2011-03-03 ENCOUNTER — Other Ambulatory Visit: Payer: Self-pay | Admitting: Internal Medicine

## 2011-03-03 ENCOUNTER — Ambulatory Visit (INDEPENDENT_AMBULATORY_CARE_PROVIDER_SITE_OTHER): Payer: Medicare Other | Admitting: Internal Medicine

## 2011-03-03 VITALS — BP 104/59 | HR 60 | Temp 97.0°F | Resp 20 | Ht 61.5 in | Wt 110.6 lb

## 2011-03-03 DIAGNOSIS — S161XXA Strain of muscle, fascia and tendon at neck level, initial encounter: Secondary | ICD-10-CM

## 2011-03-03 DIAGNOSIS — S139XXA Sprain of joints and ligaments of unspecified parts of neck, initial encounter: Secondary | ICD-10-CM

## 2011-03-03 DIAGNOSIS — F332 Major depressive disorder, recurrent severe without psychotic features: Secondary | ICD-10-CM

## 2011-03-03 DIAGNOSIS — F502 Bulimia nervosa: Secondary | ICD-10-CM

## 2011-03-03 DIAGNOSIS — M542 Cervicalgia: Secondary | ICD-10-CM

## 2011-03-03 DIAGNOSIS — D690 Allergic purpura: Secondary | ICD-10-CM

## 2011-03-03 DIAGNOSIS — R4182 Altered mental status, unspecified: Secondary | ICD-10-CM

## 2011-03-03 MED ORDER — TRAMADOL HCL 50 MG PO TABS: 50.0000 mg | ORAL_TABLET | Freq: Four times a day (QID) | ORAL | Status: DC | PRN

## 2011-03-03 MED ORDER — FLUOXETINE HCL 40 MG PO CAPS: 40.0000 mg | ORAL_CAPSULE | Freq: Every day | ORAL | Status: DC

## 2011-03-03 MED ORDER — TRAZODONE HCL 50 MG PO TABS: 50.0000 mg | ORAL_TABLET | Freq: Every day | ORAL | Status: DC

## 2011-03-03 MED ORDER — OMEPRAZOLE 40 MG PO CPDR: 40.0000 mg | DELAYED_RELEASE_CAPSULE | Freq: Every day | ORAL | Status: DC

## 2011-03-03 MED ORDER — HYDROCODONE-ACETAMINOPHEN 5-325 MG PO TABS: 1.0000 | ORAL_TABLET | Freq: Four times a day (QID) | ORAL | Status: DC | PRN

## 2011-03-03 NOTE — Assessment & Plan Note (Signed)
This pain sounds like it may of been at first acute torticollis. I told her that I would suggest physical therapy. She wishes to have the last couple of treatment at the chiropractor but is going to tell him to be careful. I told her that we are happy to refer her to physical therapy and to let us know if she needs that referral. Again I told her to stop the nonsteroidal anti-inflammatory medications because of her renal insufficiency and have given her a prescription for Vicodin. Patient is a recovering alcoholic but will not abuse these medications.

## 2011-03-03 NOTE — Assessment & Plan Note (Signed)
I personally patient did not realize that Aleve was a nonsteroidal anti-inflammatory drug and has been taking it for the last week or so. She will stop immediately. We'll check creatinine today and make sure that it is stable. I will give her a prescription for Vicodin today for her neck pain.

## 2011-03-03 NOTE — Patient Instructions (Signed)
I will see you back in 2 months. We can refer you to a physical therapist if your neck is not better. Remember Ensure if you need it.

## 2011-03-03 NOTE — Assessment & Plan Note (Addendum)
Meagan Zavala is starting to be actively bulimic again every day. I like in the past she is worried about losing weight. Therefore we have talked about the fact that she will probably needs to start supplementing her diet daily with Ensure which she is not excited about doing that may be the only that she can keep her weight up. We'll follow.

## 2011-03-03 NOTE — Progress Notes (Signed)
  Subjective:    Patient ID: Meagan Zavala, female    DOB: [birth year], 65 y.o.   MRN: [id number]  Rash Pertinent negatives include no fatigue or shortness of breath.  Neck Pain  Pertinent negatives include no chest pain.   Meagan Zavala comes in for followup. In some aspects she is doing quite well she is having some more acute complaints. She is back to living at home for the last 3 weeks and she is doing to be better. However she has lost 3 pounds and she is actively bulimic at least 2 times a day.  She has been having a lot of trouble with her neck. She has been unable to move it much and is going to see a [place]. The chiropractor has been using some fairly rough treatment on her neck. He is also sent her home with a TENS unit. None of this has helped much. Because of time and all wasn't really helping her pain someone suggested to her that she try Aleve so she has been taking 2 at a time for her neck pain and has probably used about half a bottle of 30 in the last week.(Patient said when I told her that she had not realized that this is a nonsteroidal which I told her never to take again because of her renal insufficiency).  Patient also has a new rash on her legs. This has come up in the last week or so and seems to be present more when she is out mowing the lawn and then gets much less evident. It is somewhat pruritic.    Review of Systems  Constitutional: Positive for activity change. Negative for appetite change and fatigue.  HENT: Positive for neck pain.   Respiratory: Negative for shortness of breath.   Cardiovascular: Negative for chest pain.  Gastrointestinal: Negative for abdominal pain.  Skin: Positive for rash.       Objective:   Physical Exam  HENT:  Head: Normocephalic and atraumatic.  Cardiovascular: Normal rate.   Pulmonary/Chest: Breath sounds normal.  Abdominal: Bowel sounds are normal. There is no tenderness.   lower extremities-patient does have nonpalpable  purpura around both ankle areas, it is almost confluent in some areas she has very small diffuse areas of this a little bit farther up her calves bilaterally        Assessment & Plan:

## 2011-03-03 NOTE — Assessment & Plan Note (Signed)
Patient is as close to back to baseline as I have ever seen her. It is completely amazing.

## 2011-03-03 NOTE — Assessment & Plan Note (Signed)
Patient's depression is currently very stable. We did at length today about the fact that she cannot afford to get reinvolved in her sister's life. She is worried about getting sick again. I did talk about the fact that her as happened last fall when she got involved in her sister's life she cannot physically and mentally completely worn down and I think that's when her mental status change came about and when she was hospitalized in [facility] for almost 3 weeks and almost lost her life. As I told her last year it is a life in that manner for her to get at all reinvolved in that situation and she needs to continue just taking care of herself.

## 2011-03-04 LAB — COMPLETE METABOLIC PANEL WITH GFR
ALT: 12 U/L (ref 0–35)
AST: 24 U/L (ref 0–37)
Alkaline Phosphatase: 56 U/L (ref 39–117)
BUN: 21 mg/dL (ref 6–23)
Calcium: 9.5 mg/dL (ref 8.4–10.5)
Creat: 0.86 mg/dL (ref 0.50–1.10)
Total Bilirubin: 0.5 mg/dL (ref 0.3–1.2)

## 2011-03-10 ENCOUNTER — Other Ambulatory Visit: Payer: Self-pay | Admitting: Internal Medicine

## 2011-05-12 ENCOUNTER — Ambulatory Visit (INDEPENDENT_AMBULATORY_CARE_PROVIDER_SITE_OTHER): Payer: Medicare Other | Admitting: Internal Medicine

## 2011-05-12 DIAGNOSIS — S161XXA Strain of muscle, fascia and tendon at neck level, initial encounter: Secondary | ICD-10-CM

## 2011-05-12 DIAGNOSIS — F502 Bulimia nervosa: Secondary | ICD-10-CM

## 2011-05-12 DIAGNOSIS — F332 Major depressive disorder, recurrent severe without psychotic features: Secondary | ICD-10-CM

## 2011-05-12 DIAGNOSIS — M810 Age-related osteoporosis without current pathological fracture: Secondary | ICD-10-CM

## 2011-05-12 DIAGNOSIS — S139XXA Sprain of joints and ligaments of unspecified parts of neck, initial encounter: Secondary | ICD-10-CM

## 2011-05-12 NOTE — Assessment & Plan Note (Signed)
At this point her weight is up for her but she seems to be on this and we will continue to follow.

## 2011-05-12 NOTE — Progress Notes (Signed)
  Subjective:    Patient ID: Meagan Zavala, female    DOB: 1946/03/12, 65 y.o.   MRN: [id number]  HPI Meagan Zavala comes in for 2 month followup. She is doing fairly well. She has been spending more time at her sister's house and is aware that this could bring her down again. Her weight is 119.6 and she says she wants to stay at 120. She seems to be doing fairly well from [facility] standpoint. 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 She has recently seen Dr. Shaune Pascal in for the first time since 1994 her osteoporosis is slightly improved on Reclast.   Her neck pain is finally better after going to see physical therapy which she is doing regularly. She apparently is getting some traction therapy and may get a TENS unit.  He is back in her house which is wonderful. She says she "is happy with everything".   When she was last in she had been using a couple of Aleve for her neck pain and thinks that her cr had stayed normal.  Her mental status is completely back to baseline.      Review of Systems  Constitutional: Negative for fatigue.  Respiratory: Negative for shortness of breath.   Cardiovascular: Negative for chest pain.  Gastrointestinal: Negative for nausea, vomiting and abdominal pain.       Objective:   Physical Exam  Constitutional: She appears well-developed. No distress.  HENT:  Head: Normocephalic and atraumatic.  Cardiovascular: Normal rate, regular rhythm and normal heart sounds.   No murmur heard. Pulmonary/Chest: Breath sounds normal.  Abdominal: Soft. Bowel sounds are normal. There is no tenderness.  Musculoskeletal: Normal range of motion. She exhibits no edema.  Skin: Skin is warm and dry.  Psychiatric: She has a normal mood and affect.          Assessment & Plan:

## 2011-05-12 NOTE — Assessment & Plan Note (Signed)
This is being fed by her endocrinologist in [place] and she is getting yearly treatment with Reclast.

## 2011-05-12 NOTE — Assessment & Plan Note (Signed)
Meagan Zavala is doing well but she is again cautioned that she needs to stay fairly removed from the situation and her sister's house. Thankfully her nephew [name] and her friend [name] and her church role we are this and are supporting her in this goal. Continue present medications.

## 2011-05-12 NOTE — Assessment & Plan Note (Signed)
The patient is doing well with physical therapy from this standpoint. I have prescribed Vicodin when she was last in which he takes very rarely.

## 2011-05-12 NOTE — Patient Instructions (Signed)
I will see you back in 2 months. Take care of yourself. Keep up the good work.

## 2011-05-16 LAB — HM MAMMOGRAPHY

## 2011-06-30 ENCOUNTER — Other Ambulatory Visit: Payer: Self-pay | Admitting: Internal Medicine

## 2011-06-30 ENCOUNTER — Telehealth: Payer: Self-pay | Admitting: Internal Medicine

## 2011-06-30 MED ORDER — PREDNISONE 20 MG PO TABS: ORAL_TABLET | ORAL | Status: DC

## 2011-06-30 NOTE — Telephone Encounter (Signed)
Patient calls c/o "poison ivy" all over her body. Has tried hydrocortisone cream OTC without relief. I will call in prednisone taper and have suggested calimine lotion and benadryl.

## 2011-07-14 ENCOUNTER — Ambulatory Visit (INDEPENDENT_AMBULATORY_CARE_PROVIDER_SITE_OTHER): Payer: Medicare Other | Admitting: Internal Medicine

## 2011-07-14 ENCOUNTER — Encounter: Payer: Self-pay | Admitting: Internal Medicine

## 2011-07-14 VITALS — BP 106/63 | HR 87 | Temp 98.0°F | Ht 61.0 in | Wt 120.2 lb

## 2011-07-14 DIAGNOSIS — F332 Major depressive disorder, recurrent severe without psychotic features: Secondary | ICD-10-CM

## 2011-07-14 DIAGNOSIS — S139XXA Sprain of joints and ligaments of unspecified parts of neck, initial encounter: Secondary | ICD-10-CM

## 2011-07-14 DIAGNOSIS — L237 Allergic contact dermatitis due to plants, except food: Secondary | ICD-10-CM

## 2011-07-14 DIAGNOSIS — L255 Unspecified contact dermatitis due to plants, except food: Secondary | ICD-10-CM

## 2011-07-14 DIAGNOSIS — R4182 Altered mental status, unspecified: Secondary | ICD-10-CM

## 2011-07-14 DIAGNOSIS — Z23 Encounter for immunization: Secondary | ICD-10-CM

## 2011-07-14 DIAGNOSIS — S161XXA Strain of muscle, fascia and tendon at neck level, initial encounter: Secondary | ICD-10-CM

## 2011-07-14 NOTE — Progress Notes (Signed)
  Subjective:    Patient ID: Meagan Zavala, female    DOB: 1945-11-30, 65 y.o.   MRN: [id number]  HPIPatient comes in for follow up. Unfortunately she has again gotten very involved in her sisters family. Her brother-in-law has now been diagnosed with cancer and they want her to be the medical and legal powers of attorney. As well their children and grandchildren are extremely needy. Also her best friend [name] has just had surgery and is in a body cast. She is beginning to feel run down and has been purging more.  Patient called last week for poison ivy and that is resolved with po steroids.      Review of Systems  Constitutional: Positive for fatigue. Negative for fever, chills and unexpected weight change.  Respiratory: Negative for shortness of breath.   Cardiovascular: Negative for chest pain.  Gastrointestinal: Positive for vomiting. Negative for nausea and abdominal pain.       Objective:   Physical Exam  Constitutional: She appears well-developed. No distress.  HENT:  Head: Normocephalic and atraumatic.  Cardiovascular: Normal rate, regular rhythm and normal heart sounds.   No murmur heard. Pulmonary/Chest: Breath sounds normal.  Abdominal: Soft. Bowel sounds are normal. There is no tenderness.  Musculoskeletal: Normal range of motion. She exhibits no edema.  Skin: Skin is warm and dry.  she is stressed.        Assessment & Plan:

## 2011-07-14 NOTE — Patient Instructions (Signed)
I will see you back in 1 month. Remember all that we talked about. Take care of yourself.

## 2011-07-14 NOTE — Assessment & Plan Note (Signed)
Greater than 50 minutes spent with patient with >50% spent with counseling discussing the importance of patient taking care of herself and not getting over-involved. She was reminded of last winter-spring when she had extreme mental status change that I thought was going to be irreversible. At that time the plan was for her to go into a nursing home as she was completely unable to care for herself, her MMSE score was 10/30, she sat at her friend [name]'s house and was unable to even do self-care activities. I truly believe that her body CANNOT take another huge stressor like it went through last winter. Her brain MRI shows diffuse white matter changes that are impressive patient acknowledged that she needs to limits her activity. 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 She is planning to speak to her sister and brother in law this evening. Her cousin [name] will assume the legal power of attorney for them. I will see her back in 1 month unless she needs to come in sooner.

## 2011-07-14 NOTE — Assessment & Plan Note (Signed)
Resolving with po steroids.

## 2011-07-14 NOTE — Assessment & Plan Note (Signed)
No current problem. Will follow.

## 2011-07-14 NOTE — Assessment & Plan Note (Signed)
Stable.  Continue current medications.

## 2011-08-18 ENCOUNTER — Ambulatory Visit (INDEPENDENT_AMBULATORY_CARE_PROVIDER_SITE_OTHER): Payer: Medicare Other | Admitting: Internal Medicine

## 2011-08-18 ENCOUNTER — Encounter: Payer: Self-pay | Admitting: Internal Medicine

## 2011-08-18 VITALS — BP 101/71 | HR 80 | Temp 97.8°F | Wt 122.2 lb

## 2011-08-18 DIAGNOSIS — F502 Bulimia nervosa: Secondary | ICD-10-CM

## 2011-08-18 DIAGNOSIS — S139XXA Sprain of joints and ligaments of unspecified parts of neck, initial encounter: Secondary | ICD-10-CM

## 2011-08-18 DIAGNOSIS — N3945 Continuous leakage: Secondary | ICD-10-CM

## 2011-08-18 DIAGNOSIS — F332 Major depressive disorder, recurrent severe without psychotic features: Secondary | ICD-10-CM

## 2011-08-18 DIAGNOSIS — S161XXA Strain of muscle, fascia and tendon at neck level, initial encounter: Secondary | ICD-10-CM

## 2011-08-18 NOTE — Patient Instructions (Addendum)
I will see you back in 1 week.

## 2011-08-18 NOTE — Progress Notes (Signed)
  Subjective:    Patient ID: Meagan Zavala, female    DOB: 1946/04/06, 65 y.o.   MRN: [id number]  HPIPatient comes in for 1 month follow up. Although she denies feeling depressed I am concerned that she is getting depressed. She hates the cold and has not been getting out to go to church or elsewhere socially. She did help her best friend after her surgery, and continues to help some at her sister's house who has cancer. She is concerned that her weight is up 2 more pounds.  She also c/o incontinence. She can have "leakage" anytime, even after she has just emptied her bladder. It happens at night, and she does not really have an urge. She does not want to go to a specialist and wonders if I can prescribe something.  Her neck has been bothering her a little, and she has just been being careful with it.   Review of Systems  Constitutional: Negative for fever, chills and fatigue.  HENT: Positive for neck stiffness.   Respiratory: Negative for shortness of breath.   Cardiovascular: Negative for chest pain.  Gastrointestinal: Negative for abdominal pain.  Psychiatric/Behavioral: Negative for suicidal ideas.       Objective:   Physical Exam  Constitutional: She appears well-developed and well-nourished.  HENT:  Head: Normocephalic and atraumatic.  Cardiovascular: Normal rate and regular rhythm.   Pulmonary/Chest: Breath sounds normal.  Abdominal: Bowel sounds are normal. There is no tenderness.  Musculoskeletal: She exhibits no edema.          Assessment & Plan:

## 2011-08-18 NOTE — Assessment & Plan Note (Signed)
I will read about incontinence and see what drug might be the best to try for patient's incontinence.

## 2011-08-18 NOTE — Assessment & Plan Note (Signed)
We have discussed patient's weight. When she comes in next week she will not weigh.

## 2011-08-18 NOTE — Assessment & Plan Note (Signed)
Patient will treat this supportively for now. Will follow on return next week.

## 2011-08-18 NOTE — Assessment & Plan Note (Signed)
Patient does seem to be getting down and isolating. She does not want to go up on fluoxetine or add wellbutrin at this point. I have encouraged her to get out and go to church and be around those who are supportive to her and I will see her back in 1 week for follow up. She adamantly denies suicidal ideation.

## 2011-08-25 ENCOUNTER — Ambulatory Visit (INDEPENDENT_AMBULATORY_CARE_PROVIDER_SITE_OTHER): Payer: Medicare Other | Admitting: Internal Medicine

## 2011-08-25 ENCOUNTER — Encounter: Payer: Self-pay | Admitting: Internal Medicine

## 2011-08-25 VITALS — BP 113/65 | HR 70 | Temp 97.6°F | Ht 61.0 in | Wt 123.7 lb

## 2011-08-25 DIAGNOSIS — F332 Major depressive disorder, recurrent severe without psychotic features: Secondary | ICD-10-CM

## 2011-08-25 DIAGNOSIS — M545 Low back pain: Secondary | ICD-10-CM

## 2011-08-25 DIAGNOSIS — R319 Hematuria, unspecified: Secondary | ICD-10-CM

## 2011-08-25 DIAGNOSIS — F502 Bulimia nervosa: Secondary | ICD-10-CM

## 2011-08-25 DIAGNOSIS — N3945 Continuous leakage: Secondary | ICD-10-CM

## 2011-08-25 MED ORDER — FLUOXETINE HCL 60 MG PO TABS: 1.0000 | ORAL_TABLET | ORAL | Status: DC

## 2011-08-25 NOTE — Assessment & Plan Note (Signed)
Patient had noted a little bit of possible blood on her pad I suspect that this was benign possibly related to little bit of leaky urine urine dip today was completely negative. We'll follow.

## 2011-08-25 NOTE — Assessment & Plan Note (Signed)
Patient does have significant osteoporosis followed by endocrinologist which has improved on. I told her that she can try the limbrel, again I am not familiar with it but talked her about the side effects.

## 2011-08-25 NOTE — Assessment & Plan Note (Signed)
I have increased her fluoxetine to 60 mg a day today. Patient has tolerated this in the past and I have been treating her for 10 years andin the past during the winter months she has needed a higher dose of antidepressant and that is done well for her. I will see her back in 3 weeks for followup. She is to call in meantime if she is feeling worse and not better.

## 2011-08-25 NOTE — Patient Instructions (Signed)
I will see you back in 3 weeks.  We have increased you fluoxetine to 60mg  once a day.

## 2011-08-25 NOTE — Assessment & Plan Note (Signed)
I am hopeful that did increase infarcts he will also help that excessive compulsive behavior of her eating disorder.

## 2011-08-25 NOTE — Progress Notes (Signed)
  Subjective:    Patient ID: Meagan Zavala, female    DOB: [birth year], 65 y.o.   MRN: [id number]  HPI 65 year old patient who comes in for one-week followup. The patient was then a week ago is concerned that she was getting depressed she comes in this week saying that time right and that she is getting depressed. Last week I want to go up on her fluoxetine and she is willing to do that this week. She hasn't been getting out as much and says that she thinks that the weather that is getting to her. She denies suicidal ideations but says she "just doesn't want to do much." She also continued to be concerned as far as her eating disorder she was not as the way they've insisted and says that her weight is up one more pound.  The patient went to see up with an orthopedist they gave her samples of Limbrel 500-50. She wonders what I think about her taking this. This is actually zinc and flavanoid. I am not familiar with it and told her that she can try it; the side effects I have read to her.   Patient also has noticed a tiny bit of blood on the pad that she had in her underwear she thinks it was from her urine but she is really not sure. Patient denies any other urinary, vaginal, or GI symptoms.   Review of Systems  Constitutional: Negative for fatigue.  Respiratory: Negative for shortness of breath.   Cardiovascular: Negative for chest pain.  Gastrointestinal: Negative for nausea, vomiting and abdominal pain.  Psychiatric/Behavioral: Positive for dysphoric mood.       Objective:   Physical Exam  Constitutional: She appears well-developed and well-nourished.  HENT:  Head: Normocephalic and atraumatic.  Neck: No JVD present.  Cardiovascular: Normal rate and regular rhythm.   Pulmonary/Chest: Breath sounds normal.  Abdominal: Bowel sounds are normal.          Assessment & Plan:

## 2011-09-17 ENCOUNTER — Ambulatory Visit: Payer: Medicare Other | Admitting: Internal Medicine

## 2011-09-24 ENCOUNTER — Ambulatory Visit (INDEPENDENT_AMBULATORY_CARE_PROVIDER_SITE_OTHER): Payer: Medicare Other | Admitting: Internal Medicine

## 2011-09-24 DIAGNOSIS — S139XXA Sprain of joints and ligaments of unspecified parts of neck, initial encounter: Secondary | ICD-10-CM

## 2011-09-24 DIAGNOSIS — M810 Age-related osteoporosis without current pathological fracture: Secondary | ICD-10-CM

## 2011-09-24 DIAGNOSIS — S161XXA Strain of muscle, fascia and tendon at neck level, initial encounter: Secondary | ICD-10-CM

## 2011-09-24 DIAGNOSIS — F332 Major depressive disorder, recurrent severe without psychotic features: Secondary | ICD-10-CM

## 2011-09-24 DIAGNOSIS — M542 Cervicalgia: Secondary | ICD-10-CM

## 2011-09-24 DIAGNOSIS — M545 Low back pain: Secondary | ICD-10-CM

## 2011-09-24 DIAGNOSIS — F502 Bulimia nervosa: Secondary | ICD-10-CM

## 2011-09-24 MED ORDER — TRAMADOL HCL 50 MG PO TABS: 50.0000 mg | ORAL_TABLET | Freq: Four times a day (QID) | ORAL | Status: DC | PRN

## 2011-09-24 MED ORDER — TRAZODONE HCL 50 MG PO TABS: 50.0000 mg | ORAL_TABLET | Freq: Every day | ORAL | Status: DC

## 2011-09-24 MED ORDER — FLUOXETINE HCL 60 MG PO TABS: 1.0000 | ORAL_TABLET | ORAL | Status: DC

## 2011-09-24 MED ORDER — OMEPRAZOLE 40 MG PO CPDR: 40.0000 mg | DELAYED_RELEASE_CAPSULE | Freq: Every day | ORAL | Status: DC

## 2011-09-24 MED ORDER — HYDROCODONE-ACETAMINOPHEN 5-325 MG PO TABS
1.0000 | ORAL_TABLET | Freq: Four times a day (QID) | ORAL | Status: AC | PRN
Start: 2011-09-24 — End: 2011-10-04

## 2011-09-24 NOTE — Assessment & Plan Note (Signed)
As per overview, This has been going on for many decades of patient's life. She has kept herself alive by drinking Ensure that she is unable to purge. Since her episode of severe change in mental status last year (that miraculously completely resolved) her weight has gone up more than she would like. At first she didn't seem to mind-which was very different for her!, but now it is bothering her more. (Low wt-89 lbs, high wt-123.7 lbs).

## 2011-09-24 NOTE — Assessment & Plan Note (Signed)
She also has a prescription for tramadol.

## 2011-09-24 NOTE — Assessment & Plan Note (Signed)
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 She has some vicodin, which she does not abuse which she uses sparingly and that I have refilled. She also has tramadol. 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

## 2011-09-24 NOTE — Assessment & Plan Note (Signed)
Meagan Zavala is doing better on fluoxetine 60mg  a day. This is refilled today #90 at a time with refills. She is again cautioned about getting too involved with her sisters family as that can bring her down. Her main support is her nephew, [name] who currently has some stressors of his own. 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

## 2011-09-24 NOTE — Progress Notes (Signed)
Subjective:     Patient ID: Meagan Zavala, female   DOB: 1946-08-09, 65 y.o.   MRN: [id number]  HPI 64 year old patient who comes in for 1 month follow up. Meagan Zavala is doing better as far as her depression. The increased dose of fluoxetine has helped some. As well she has started caring for an elderly couple and is enjoying this very much. However she has been around her sister's house quite a bit over the holidays which will always be stressful. As well this is the last day that I will be able to see her and this is difficult. Meagan Zavala, her nephew and most supportive relative, has had A lot of stress of his own, with his half sister just being diagnosed with lung cancer.   Her weight is down 2 pounds, but this is because she has been more bulemic and taken in less.  She also complains of some pain in her right neck/shoulder like what she had in her left, "but it has not gotten as bad yet." Last time it took 3 weeks for "PT to get the knot out."  She "wonders if she is getting dementia because she misplaced the remote control to the TV, and her cell phone," but her mental status remains 100% better than it was this time last year. We did talk about this at length.   Review of Systems  Constitutional: Negative for fever, chills, diaphoresis and activity change.  Respiratory: Negative for shortness of breath.   Cardiovascular: Negative for chest pain and palpitations.  Gastrointestinal: Negative for abdominal pain.  Psychiatric/Behavioral: Negative for suicidal ideas.       Objective:   Physical Exam  Constitutional: She appears well-developed and well-nourished.  HENT:  Head: Normocephalic and atraumatic.  Neck: Neck supple.  Cardiovascular: Normal rate and regular rhythm.   Pulmonary/Chest: Breath sounds normal.  Abdominal: Bowel sounds are normal. There is no tenderness.  Musculoskeletal: She exhibits no edema.  Psychiatric: She has a normal mood and affect.       Assessment:        Plan:

## 2011-09-24 NOTE — Assessment & Plan Note (Signed)
As per overview, no new changes.

## 2011-09-25 NOTE — Patient Instructions (Signed)
Please follow up with Dr. Duncan Dull at [facility] in [place], [place].

## 2011-11-16 ENCOUNTER — Encounter: Payer: Self-pay | Admitting: Internal Medicine

## 2011-11-16 ENCOUNTER — Ambulatory Visit (INDEPENDENT_AMBULATORY_CARE_PROVIDER_SITE_OTHER): Payer: Medicare Other | Admitting: Internal Medicine

## 2011-11-16 DIAGNOSIS — M542 Cervicalgia: Secondary | ICD-10-CM

## 2011-11-16 DIAGNOSIS — M810 Age-related osteoporosis without current pathological fracture: Secondary | ICD-10-CM

## 2011-11-16 DIAGNOSIS — F502 Bulimia nervosa: Secondary | ICD-10-CM

## 2011-11-16 DIAGNOSIS — H3530 Unspecified macular degeneration: Secondary | ICD-10-CM | POA: Insufficient documentation

## 2011-11-16 DIAGNOSIS — R4182 Altered mental status, unspecified: Secondary | ICD-10-CM

## 2011-11-16 NOTE — Assessment & Plan Note (Signed)
Managed previously with NSAIDs which caused CKD which resolve d with suspension of all NSAIDs.

## 2011-11-16 NOTE — Progress Notes (Signed)
Subjective:    Patient ID: Meagan Zavala, female    DOB: 02/15/1946, 66 y.o.   MRN: [id number]  HPI  Ms. Deam is a 66 yr old white female with history of osteoporosis with prior fractures who is transferring care from [facility] since 2004.  Her osteoporosis is managed by Dr. Kerrie Pleasure for the past  25 yrs .  She had prior treatment with forteowith progression and is now  Receiving annual injections of  Reclast, calcitonin and Vit d.  History of wrist , pelvic fractures, mulriple vertebrae and 6 fractured ribs.   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 NNot interested in treatment.  History of hospitalization Jan 2012 for pneumonia accompanied by wt loss and some form of encepalopathy?  Required supervised living for 3 months,  After transfer to [facility] for persistent pneumonia. No hosp since Jan 2012.  Weight has been improving, now to 123.    Past Medical History  Diagnosis Date  . BULIMIA many decades of this    patient keeps alive by drinking ensure that she cannot purge  . Major depressive disorder, recurrent episode, severe     well treated with SSRI's  . KIDNEY DISEASE, CHRONIC, STAGE III 2010    Cr 1.3 -> .84, probably from NSAID's and lasix  . LOW BACK PAIN, CHRONIC     takes tylenol  . Basal cell carcinoma of face   . LEAKAGE, CONTINUOUS URINE 07/30/2009  . Macular degeneration disease     Ding[place]ein, now [place]  . OSTEOPOROSIS     severe, treated with infusions, followed by endocrine in [facility]   Current Outpatient Prescriptions on File Prior to Visit  Medication Sig Dispense Refill  . calcitonin, salmon, (MIACALCIN/FORTICAL) 200 UNIT/ACT nasal spray 1 spray by Nasal route daily.        . calcium citrate-vitamin D 200-200 MG-UNIT TABS Take 1 tablet by mouth daily.        Marland Kitchen FLUoxetine HCl 60 MG TABS Take 1 capsule by  mouth 1 day or 1 dose.  93 tablet  3  . Omega-3 Fatty Acids (FISH OIL) 1000 MG CPDR Take 1 capsule by mouth.        Marland Kitchen omeprazole (PRILOSEC) 40 MG capsule Take 1 capsule (40 mg total) by mouth daily.  93 capsule  3  . polyethylene glycol powder (GLYCOLAX/MIRALAX) powder MIX 17GM IN 8 OUNCE OF WATER AND DRINK ONCE A DAY  527 g  11  . traMADol (ULTRAM) 50 MG tablet Take 1-2 tablets (50-100 mg total) by mouth every 6 (six) hours as needed.  180 tablet  3  . traZODone (DESYREL) 50 MG tablet Take 1 tablet (50 mg total) by mouth daily.  93 tablet  3  . Vitamin D, Ergocalciferol, (DRISDOL) 50000 UNITS CAPS Take 50,000 Units by mouth every 7 (seven) days.       . zoledronic acid (RECLAST) 5 MG/100ML SOLN Verify instructions       Review of Systems  Constitutional: Negative for fever, chills and unexpected weight change.  HENT: Negative for hearing loss, ear pain, nosebleeds, congestion, sore throat, facial swelling, rhinorrhea, sneezing, mouth sores, trouble swallowing, neck pain, neck stiffness, voice change, postnasal drip, sinus pressure, tinnitus and ear discharge.   Eyes: Negative for pain, discharge, redness and visual disturbance.  Respiratory: Negative for cough, chest tightness, shortness of breath, wheezing and  stridor.   Cardiovascular: Negative for chest pain, palpitations and leg swelling.  Musculoskeletal: Negative for myalgias and arthralgias.  Skin: Negative for color change and rash.  Neurological: Negative for dizziness, weakness, light-headedness and headaches.  Hematological: Negative for adenopathy.       Objective:   Physical Exam  Constitutional: She is oriented to person, place, and time. She appears well-developed and well-nourished.  HENT:  Mouth/Throat: Oropharynx is clear and moist.  Eyes: EOM are normal. Pupils are equal, round, and reactive to light. No scleral icterus.  Neck: Normal range of motion. Neck supple. No JVD present. No thyromegaly present.    Cardiovascular: Normal rate, regular rhythm, normal heart sounds and intact distal pulses.   Pulmonary/Chest: Effort normal and breath sounds normal.  Abdominal: Soft. Bowel sounds are normal. She exhibits no mass. There is no tenderness.  Musculoskeletal: Normal range of motion. She exhibits no edema.  Lymphadenopathy:    She has no cervical adenopathy.  Neurological: She is alert and oriented to person, place, and time.  Skin: Skin is warm and dry.  Psychiatric: She has a normal mood and affect.          Assessment & Plan:   OSTEOPOROSIS With 5 inch loss of height due to vertebral fractures.  Continue  Reclast, vit D,  calcitoninin for mitigation ofvertebral pain   Neck pain Managed previously with NSAIDs which caused CKD which resolve d with suspension of all NSAIDs.    BULIMIA Chronic with daily gorging and purging.  Not interested in treatment.  Weight is currently stable.   Change in mental status Her history suggests that she had a transient encephalopathy caused by her prior episode of pneumonia.   Will review records     Updated Medication List Outpatient Encounter Prescriptions as of 11/16/2011  Medication Sig Dispense Refill  . calcitonin, salmon, (MIACALCIN/FORTICAL) 200 UNIT/ACT nasal spray 1 spray by Nasal route daily.        . calcium citrate-vitamin D 200-200 MG-UNIT TABS Take 1 tablet by mouth daily.        Marland Kitchen FLUoxetine HCl 60 MG TABS Take 1 capsule by mouth 1 day or 1 dose.  93 tablet  3  . Omega-3 Fatty Acids (FISH OIL) 1000 MG CPDR Take 1 capsule by mouth.        Marland Kitchen omeprazole (PRILOSEC) 40 MG capsule Take 1 capsule (40 mg total) by mouth daily.  93 capsule  3  . polyethylene glycol powder (GLYCOLAX/MIRALAX) powder MIX 17GM IN 8 OUNCE OF WATER AND DRINK ONCE A DAY  527 g  11  . traMADol (ULTRAM) 50 MG tablet Take 1-2 tablets (50-100 mg total) by mouth every 6 (six) hours as needed.  180 tablet  3  . traZODone (DESYREL) 50 MG tablet Take 1 tablet (50 mg  total) by mouth daily.  93 tablet  3  . Vitamin D, Ergocalciferol, (DRISDOL) 50000 UNITS CAPS Take 50,000 Units by mouth every 7 (seven) days.       . zoledronic acid (RECLAST) 5 MG/100ML SOLN Verify instructions

## 2011-11-16 NOTE — Assessment & Plan Note (Addendum)
With 5 inch loss of height due to vertebral fractures.  Continue  Reclast, vit D,  calcitoninin for mitigation ofvertebral pain

## 2011-11-16 NOTE — Assessment & Plan Note (Signed)
Her history suggests that she had a transient encephalopathy caused by her prior episode of pneumonia.   Will review records

## 2011-11-16 NOTE — Assessment & Plan Note (Signed)
Chronic with daily gorging and purging.  Not interested in treatment.  Weight is currently stable.

## 2011-11-20 ENCOUNTER — Ambulatory Visit: Payer: Self-pay | Admitting: Ophthalmology

## 2011-11-30 ENCOUNTER — Ambulatory Visit: Payer: Self-pay | Admitting: Ophthalmology

## 2011-12-07 ENCOUNTER — Other Ambulatory Visit: Payer: Self-pay | Admitting: Internal Medicine

## 2011-12-29 ENCOUNTER — Ambulatory Visit: Payer: Self-pay | Admitting: Ophthalmology

## 2012-02-19 ENCOUNTER — Ambulatory Visit: Payer: Medicare Other | Admitting: Internal Medicine

## 2012-02-23 ENCOUNTER — Encounter: Payer: Self-pay | Admitting: Internal Medicine

## 2012-02-23 ENCOUNTER — Ambulatory Visit (INDEPENDENT_AMBULATORY_CARE_PROVIDER_SITE_OTHER): Payer: Medicare Other | Admitting: Internal Medicine

## 2012-02-23 VITALS — BP 110/62 | HR 64 | Temp 97.7°F | Resp 14 | Wt 121.2 lb

## 2012-02-23 DIAGNOSIS — Z1211 Encounter for screening for malignant neoplasm of colon: Secondary | ICD-10-CM

## 2012-02-23 DIAGNOSIS — F332 Major depressive disorder, recurrent severe without psychotic features: Secondary | ICD-10-CM

## 2012-02-23 DIAGNOSIS — Z1239 Encounter for other screening for malignant neoplasm of breast: Secondary | ICD-10-CM

## 2012-02-23 DIAGNOSIS — F502 Bulimia nervosa, unspecified: Secondary | ICD-10-CM

## 2012-02-23 DIAGNOSIS — G4700 Insomnia, unspecified: Secondary | ICD-10-CM

## 2012-02-23 DIAGNOSIS — N183 Chronic kidney disease, stage 3 unspecified: Secondary | ICD-10-CM

## 2012-02-23 DIAGNOSIS — M810 Age-related osteoporosis without current pathological fracture: Secondary | ICD-10-CM

## 2012-02-23 MED ORDER — FLUOXETINE HCL 40 MG PO CAPS: 40.0000 mg | ORAL_CAPSULE | Freq: Every day | ORAL | Status: DC

## 2012-02-23 NOTE — Progress Notes (Signed)
Patient ID: Meagan Zavala, female   DOB: 1945-12-01, 66 y.o.   MRN: [id number]  Patient Active Problem List  Diagnoses  . BULIMIA  . Major depressive disorder, recurrent episode, severe  . GERD  . KIDNEY DISEASE, CHRONIC, STAGE III  . LOW BACK PAIN, CHRONIC  . OSTEOPOROSIS  . LEAKAGE, CONTINUOUS URINE  . Change in mental status  . Pneumonia  . Insomnia  . Neck muscle strain  . Neck pain  . Macular degeneration disease    Subjective:  CC:   Chief Complaint  Patient presents with  . Follow-up    3 month    HPI:   Meagan Zavala a 66 y.o. female who presents for  Follow up on chronic issues.  She is having a difficult time grieving the  loss of sister in January, complicated by her niece and nephew continually asking for financial assistance.  She dies find comfort in taking care of an elderly couple during the daythat remind her of her parents.  Her prozac  Dose is historically increased during winter months to 60 mg and she feel it is time to reduce to 40 mg daily since it is the spring again. Participating in [place] but does not have the energy   oaccept the invitation to help run the next 16 week course. She continues to manage her weight with restricted diet and purging infrequently, as she has historically since her gastric bypass. Has been recentyl diagnosed by Dr. Dorcas Mcmurray with macular degeneration of the right eye. .  Bowels and bladder regimen unchanged.     Past Medical History  Diagnosis Date  . BULIMIA many decades of this    patient keeps alive by drinking ensure that she cannot purge  . Major depressive disorder, recurrent episode, severe     well treated with SSRI's  . KIDNEY DISEASE, CHRONIC, STAGE III 2010    Cr 1.3 -> .84, probably from NSAID's and lasix  . LOW BACK PAIN, CHRONIC     takes tylenol  . Basal cell carcinoma of face   . LEAKAGE, CONTINUOUS URINE 07/30/2009  . Macular degeneration disease     Dingledein, now [place]  .  OSTEOPOROSIS     severe, treated with infusions, followed by endocrine in [facility]    Past Surgical History  Procedure Date  . 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    Review of Systems:   12 Pt  review of systems was negative except those addressed in the HPI,     History   Social History  . Marital Status: Single    Spouse Name: N/A    Number of Children: N/A  . Years of Education: N/A   Occupational History  . Not on file.   Social History Main Topics  . Smoking status: Former Smoker    Quit date: 10/02/2010  . Smokeless tobacco: Never Used  . 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 Drug Use: No  . Sexually Active: Not on file   Other Topics Concern  . Not on file   Social History Narrative   Patient has been living alone. She is on disability. She goes to church and has support there. She has a very supportive cousin, Meagan Zavala: (  [phone].    Objective:  BP 110/62  Pulse 64  Temp(Src) 97.7 F (36.5 C) (Oral)  Resp 14  Wt 121 lb 4 oz (54.999 kg)  SpO2 94%  General appearance: alert, cooperative and appears stated age Ears: normal TM's and external ear canals both ears Throat: lips, mucosa, and tongue normal; teeth and gums normal Neck: no adenopathy, no carotid bruit, supple, symmetrical, trachea midline and thyroid not enlarged, symmetric, no tenderness/mass/nodules Back: symmetric, no curvature. ROM normal. No CVA tenderness. Lungs: clear to auscultation bilaterally Heart: regular rate and rhythm, S1, S2 normal, no murmur, click, rub or gallop Abdomen: soft, non-tender; bowel sounds normal; no masses,  no organomegaly Pulses: 2+ and symmetric Skin: Skin color, texture, turgor normal. No rashes or lesions Lymph nodes: Cervical, supraclavicular,  and axillary nodes normal.  Assessment and Plan:  BULIMIA Chronic behavioral/physiologic sequelae of gastric bypass.  Weight is stable and electrolytes have been normal.  Insomnia Managed with trazodone.  No changes today.  KIDNEY DISEASE, CHRONIC, STAGE III 6 month followup at [facility].  Labs due. Secondary to NSAIDs and furosemide per prior PCP.  Major depressive disorder, recurrent episode, severe Despite her  Grief reaction to sister's death she is managing well with prozac, trazodone and participation in  [place].  OSTEOPOROSIS Managed with annual infusions of Reclast by Dr. Kerrie Pleasure, who also orders her DEXA scans.     Updated Medication List Outpatient Encounter Prescriptions as of 02/23/2012  Medication Sig Dispense Refill  . calcitonin, salmon, (MIACALCIN/FORTICAL) 200 UNIT/ACT nasal spray 1 spray by Nasal route daily.        . calcium citrate-vitamin D 200-200 MG-UNIT TABS Take 1 tablet by mouth daily.        . Multiple Vitamins-Minerals (ICAPS PO) Take by mouth.      Marland Kitchen omeprazole (PRILOSEC) 40 MG capsule Take 1 capsule (40 mg total) by mouth daily.  93 capsule  3  . polyethylene glycol powder (GLYCOLAX/MIRALAX) powder MIX 17GM IN 8 OUNCE OF WATER AND DRINK ONCE A DAY  527 g  11  . traMADol (ULTRAM) 50 MG tablet Take 1-2 tablets (50-100 mg total) by mouth every 6 (six) hours as needed.  180 tablet  3  . traZODone (DESYREL) 50 MG tablet Take 1 tablet (50 mg total) by mouth daily.  93 tablet  3  . Vitamin D, Ergocalciferol, (DRISDOL) 50000 UNITS CAPS Take 50,000 Units by mouth every 7 (seven) days.       . zoledronic acid (RECLAST) 5 MG/100ML SOLN Every year.      Marland Kitchen DISCONTD: FLUoxetine HCl 60 MG TABS Take 1 capsule by mouth 1 day or 1 dose.  93 tablet  3  . FLUoxetine (PROZAC) 40 MG capsule Take 1 capsule (40 mg total) by mouth daily.  90 capsule  3  . DISCONTD: Omega-3 Fatty Acids (FISH OIL) 1000 MG CPDR Take 1 capsule by mouth.           Orders  Placed This Encounter  Procedures  . MM Digital Screening  . Ambulatory referral to Gastroenterology    Return in about 6 months (around 08/25/2012).

## 2012-02-24 ENCOUNTER — Encounter: Payer: Self-pay | Admitting: Internal Medicine

## 2012-02-24 NOTE — Assessment & Plan Note (Signed)
Chronic behavioral/physiologic sequelae of gastric bypass.  Weight is stable and electrolytes have been normal.

## 2012-02-24 NOTE — Assessment & Plan Note (Signed)
Managed with trazodone.  No changes today 

## 2012-02-24 NOTE — Assessment & Plan Note (Signed)
Managed with annual infusions of Reclast by Dr. Kerrie Pleasure, who also orders her DEXA scans.

## 2012-02-24 NOTE — Assessment & Plan Note (Signed)
6 month followup at [facility].  Labs due. Secondary to NSAIDs and furosemide per prior PCP.

## 2012-02-24 NOTE — Assessment & Plan Note (Signed)
Despite her  Grief reaction to sister's death she is managing well with prozac, trazodone and participation in  [place].

## 2012-04-05 ENCOUNTER — Telehealth: Payer: Self-pay | Admitting: Internal Medicine

## 2012-04-05 DIAGNOSIS — M1990 Unspecified osteoarthritis, unspecified site: Secondary | ICD-10-CM

## 2012-04-05 NOTE — Telephone Encounter (Signed)
No, I am happy to refer her .  Which joints are affected?  And 2) rheumatologists are rare.  There is one in [place] and a few more in [place].  Or would she like [facility] or [facility]?

## 2012-04-05 NOTE — Telephone Encounter (Signed)
Patient states her arthritis is getting worse. Can she get a referral or should she come in for an OV with Dr Darrick Huntsman?

## 2012-04-07 NOTE — Telephone Encounter (Signed)
Patient stated her neck and wrists are mostly affected and if possible she wants to go to the doctor that's in [place].  Please advise.

## 2012-04-07 NOTE — Telephone Encounter (Signed)
Patient called back and stated if she can't see the doctor in [place], she stated she would like to go to [place] at the hospital.

## 2012-04-07 NOTE — Telephone Encounter (Signed)
Left message asking patient to return my call.

## 2012-04-08 NOTE — Telephone Encounter (Signed)
Patient advised that she will receive a call from our office with appt

## 2012-04-08 NOTE — Telephone Encounter (Signed)
Referral made in epic.

## 2012-04-12 ENCOUNTER — Other Ambulatory Visit: Payer: Self-pay | Admitting: Internal Medicine

## 2012-04-12 ENCOUNTER — Encounter: Payer: Self-pay | Admitting: Internal Medicine

## 2012-04-12 NOTE — Telephone Encounter (Signed)
Patient has ran out of pain medication Hydroco-acetaminop /5.325 mg

## 2012-04-13 ENCOUNTER — Telehealth: Payer: Self-pay | Admitting: Internal Medicine

## 2012-04-13 NOTE — Telephone Encounter (Signed)
Patient in a lot of pain left work early .  I sent this patient to call a nurse and she has not had a response back .

## 2012-04-13 NOTE — Telephone Encounter (Signed)
Caller: Meagan Zavala/Patient; PCP: Duncan Dull; CB#: [phone]; ; ; Call regarding Pain;  States has appointment with rheumatologist 8-22 but pain has worsened 7-17. States had to leave work early 7-17 due to pain. Pain is more in back/hips. Thinks may have fibromyalgia but wants to see MD for further treatment as has taken Tramadol but has not helped. Per Back Symptoms protocol appointment scheduled for 7-18 at 1015.

## 2012-04-14 ENCOUNTER — Encounter: Payer: Self-pay | Admitting: Internal Medicine

## 2012-04-14 ENCOUNTER — Telehealth: Payer: Self-pay | Admitting: Internal Medicine

## 2012-04-14 ENCOUNTER — Ambulatory Visit (INDEPENDENT_AMBULATORY_CARE_PROVIDER_SITE_OTHER): Payer: Medicare Other | Admitting: Internal Medicine

## 2012-04-14 VITALS — BP 102/56 | HR 80 | Temp 98.6°F | Ht 61.0 in | Wt 122.0 lb

## 2012-04-14 DIAGNOSIS — R5381 Other malaise: Secondary | ICD-10-CM

## 2012-04-14 DIAGNOSIS — M810 Age-related osteoporosis without current pathological fracture: Secondary | ICD-10-CM

## 2012-04-14 DIAGNOSIS — R5383 Other fatigue: Secondary | ICD-10-CM

## 2012-04-14 DIAGNOSIS — E038 Other specified hypothyroidism: Secondary | ICD-10-CM

## 2012-04-14 DIAGNOSIS — M130 Polyarthritis, unspecified: Secondary | ICD-10-CM

## 2012-04-14 DIAGNOSIS — R350 Frequency of micturition: Secondary | ICD-10-CM

## 2012-04-14 DIAGNOSIS — E559 Vitamin D deficiency, unspecified: Secondary | ICD-10-CM

## 2012-04-14 LAB — IRON AND TIBC
%SAT: 7 % — ABNORMAL LOW (ref 20–55)
Iron: 28 ug/dL — ABNORMAL LOW (ref 42–145)

## 2012-04-14 LAB — POCT URINALYSIS DIPSTICK
Blood, UA: NEGATIVE
Nitrite, UA: NEGATIVE
Spec Grav, UA: 1.02
Urobilinogen, UA: 0.2
pH, UA: 7

## 2012-04-14 LAB — TSH: TSH: 1.71 u[IU]/mL (ref 0.35–5.50)

## 2012-04-14 LAB — FERRITIN: Ferritin: 41.7 ng/mL (ref 10.0–291.0)

## 2012-04-14 LAB — SEDIMENTATION RATE: Sed Rate: 58 mm/hr — ABNORMAL HIGH (ref 0–22)

## 2012-04-14 MED ORDER — HYDROCODONE-ACETAMINOPHEN 7.5-500 MG PO TABS: 1.0000 | ORAL_TABLET | Freq: Four times a day (QID) | ORAL | Status: AC | PRN

## 2012-04-14 NOTE — Telephone Encounter (Signed)
Pharmacist calling today 04/14/12 regarding pt is at pharmacy and said Dr. Darrick Huntsman was supposed to send pain meds to pharmacy and they have not received it yet.  Pulled chart in EPIC and reviewed script as written by Dr. Darrick Huntsman below: HYDROcodone-acetaminophen (LORTAB 7.5) 7.5-500 MG per tablet  90 tablet  0  04/14/2012  04/24/2012   Take 1 tablet by mouth every 6 (six) hours as needed for pain. - Oral

## 2012-04-14 NOTE — Progress Notes (Addendum)
Patient ID: Meagan Zavala, female   DOB: 1946/04/10, 66 y.o.   MRN: [id number]  Patient Active Problem List  Diagnosis  . BULIMIA  . Major depressive disorder, recurrent episode, severe  . GERD  . KIDNEY DISEASE, CHRONIC, STAGE III  . LOW BACK PAIN, CHRONIC  . OSTEOPOROSIS  . LEAKAGE, CONTINUOUS URINE  . Change in mental status  . Pneumonia  . Insomnia  . Neck muscle strain  . Neck pain  . Macular degeneration disease  . Polyarthritis    Subjective:  CC:   Chief Complaint  Patient presents with  . Back Pain    also pain all over body ?arthritis    HPI:   Meagan Zavala a 66 y.o. female who presents New onset unprovoked  pain involving  right wrist which she first noticed on July 4.. She C7 trouble (opening jars and performing some activities of daily living). She has a remote history of a  fracture involving the  same wrist.   She has chronic back and hip pain but it has become so severe at times that she has had to resort to using expired Vicodin for pain management. He states it most of the joints in her body that are involved in moving her arms and legs hShave been stiff and achy for several weeks and she is generally miserable. e is requesting referral to a rheumatologist  that she has never had an evaluation for inflammatory arthritis.    Past Medical History  Diagnosis Date  . BULIMIA many decades of this    patient keeps alive by drinking ensure that she cannot purge  . Major depressive disorder, recurrent episode, severe     well treated with SSRI's  . KIDNEY DISEASE, CHRONIC, STAGE III 2010    Cr 1.3 -> .84, probably from NSAID's and lasix  . LOW BACK PAIN, CHRONIC     takes tylenol  . Basal cell carcinoma of face   . LEAKAGE, CONTINUOUS URINE 07/30/2009  . Macular degeneration disease     [place], now [place]  . OSTEOPOROSIS     severe, treated with infusions, followed by endocrine in [facility]    Past Surgical History  Procedure Date  .  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    Review of Systems:   12 Pt  review of systems was negative except those addressed in the HPI,     History   Social History  . Marital Status: Single    Spouse Name: N/A    Number of Children: N/A  . Years of Education: N/A   Occupational History  . Not on file.   Social History Main Topics  . Smoking status: Former Smoker    Quit date: 10/02/2010  . Smokeless tobacco: Never Used  . 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 Drug Use: No  . Sexually Active: Not on file   Other Topics Concern  . Not on file   Social History Narrative   Patient has been living alone. She is on disability. She goes to church and has support there. She has a very supportive cousin, Roben Tatsch: [phone].    Objective:  BP 102/56  Pulse 80  Temp 98.6 F (37 C) (Oral)  Ht 5\' 1"  (1.549 m)  Wt 122 lb (55.339 kg)  BMI 23.05 kg/m2  SpO2 98%  General appearance: alert, cooperative and appears stated age Ears: normal TM's and external ear canals both ears Throat: lips, mucosa, and tongue normal; teeth and gums normal Neck: no adenopathy, no carotid bruit, supple, symmetrical, trachea midline and thyroid not enlarged, symmetric, no tenderness/mass/nodules Back: symmetric, no curvature. ROM normal. No CVA tenderness. Lungs: clear to auscultation bilaterally Heart: regular rate and rhythm, S1, S2 normal, no murmur, click, rub or gallop Abdomen: soft, non-tender; bowel sounds normal; no masses,  no organomegaly Pulses: 2+ and symmetric Skin: Skin color, texture, turgor normal. No rashes or lesions Lymph nodes: Cervical, supraclavicular, and axillary nodes normal.  Assessment and Plan:  Polyarthritis Will initiate referral to Dr. Lavenia Atlas and  obtain serologies for autoimmune and inflammatory arthridities.  Her Vicodin is being started for over a year. Will give her a new prescription for stronger doses that she can cut in half.  OSTEOPOROSIS Managed by Dr. Patrecia Pace with reClast infusions and calcitonin.. We still did not receive records. She takes 2 still weekly for vitamin D deficiency her vitamin D level is normal today.    Updated Medication List Outpatient Encounter Prescriptions as of 04/14/2012  Medication Sig Dispense Refill  . calcitonin, salmon, (MIACALCIN/FORTICAL) 200 UNIT/ACT nasal spray 1 spray by Nasal route daily.        . calcium citrate-vitamin D 200-200 MG-UNIT TABS Take 1 tablet by mouth daily.        Marland Kitchen FLUoxetine (PROZAC) 40 MG capsule Take 1 capsule (40 mg total) by mouth daily.  90 capsule  3  . Multiple Vitamins-Minerals (ICAPS PO) Take by mouth.      Marland Kitchen omeprazole (PRILOSEC) 40 MG capsule Take 1 capsule (40 mg total) by mouth daily.  93 capsule  3  . polyethylene glycol powder (GLYCOLAX/MIRALAX) powder MIX 17GM IN 8 OUNCE OF WATER AND DRINK ONCE A DAY  527 g  11  . traMADol (ULTRAM) 50 MG tablet Take 1-2 tablets (50-100 mg total) by mouth every 6 (six) hours as needed.  180 tablet  3  . traZODone (DESYREL) 50 MG tablet Take 1 tablet (50 mg total) by mouth daily.  93 tablet  3  . Vitamin D, Ergocalciferol, (DRISDOL) 50000 UNITS CAPS Take 50,000 Units by mouth every 7 (seven) days.       . zoledronic acid (RECLAST) 5 MG/100ML SOLN Every year.      Marland Kitchen HYDROcodone-acetaminophen (LORTAB 7.5) 7.5-500 MG per tablet Take 1 tablet by mouth every 6 (six) hours as needed for pain.  90 tablet  0

## 2012-04-14 NOTE — Telephone Encounter (Signed)
Since I obviously approved this,  Was it phoned in?

## 2012-04-15 LAB — COMPLETE METABOLIC PANEL WITH GFR
Alkaline Phosphatase: 77 U/L (ref 39–117)
BUN: 14 mg/dL (ref 6–23)
CO2: 27 mEq/L (ref 19–32)
GFR, Est African American: 64 mL/min
GFR, Est Non African American: 55 mL/min — ABNORMAL LOW
Glucose, Bld: 104 mg/dL — ABNORMAL HIGH (ref 70–99)
Sodium: 136 mEq/L (ref 135–145)
Total Bilirubin: 0.8 mg/dL (ref 0.3–1.2)
Total Protein: 6.2 g/dL (ref 6.0–8.3)

## 2012-04-15 LAB — CYCLIC CITRUL PEPTIDE ANTIBODY, IGG: Cyclic Citrullin Peptide Ab: <2 U/mL (ref 0.0–5.0)

## 2012-04-15 LAB — ANA: Anti Nuclear Antibody(ANA): NEGATIVE

## 2012-04-15 LAB — RHEUMATOID FACTOR: Rhuematoid fact SerPl-aCnc: <10 IU/mL (ref <=14)

## 2012-04-15 LAB — HM COLONOSCOPY

## 2012-04-15 NOTE — Telephone Encounter (Signed)
Rx phoned in by Call A Nurse yesterday-Janele Lague C. Confirmed with pharmacy this morning.

## 2012-04-16 ENCOUNTER — Encounter: Payer: Self-pay | Admitting: Internal Medicine

## 2012-04-16 DIAGNOSIS — M130 Polyarthritis, unspecified: Secondary | ICD-10-CM | POA: Insufficient documentation

## 2012-04-16 NOTE — Assessment & Plan Note (Addendum)
Managed by Dr. Patrecia Pace with reClast infusions and calcitonin.. We still did not receive records. She takes 2 still weekly for vitamin D deficiency her vitamin D level is normal today.

## 2012-04-16 NOTE — Assessment & Plan Note (Signed)
Will initiate referral to Dr. Lavenia Atlas and obtain serologies for autoimmune and inflammatory arthridities.  Her Vicodin is being started for over a year. Will give her a new prescription for stronger doses that she can cut in half.

## 2012-04-19 ENCOUNTER — Telehealth: Payer: Self-pay | Admitting: *Deleted

## 2012-04-19 MED ORDER — FERROUS FUM-IRON POLYSACCH 162-115.2 MG PO CAPS: 1.0000 | ORAL_CAPSULE | Freq: Every day | ORAL | Status: DC

## 2012-04-19 NOTE — Telephone Encounter (Signed)
Patient advised as instructed via telephone. 

## 2012-04-19 NOTE — Telephone Encounter (Signed)
Patient called and was advised of her lab results, she stated that she has never been on an iron supplement before but she can buy some OTC.  What dose should she be on?

## 2012-04-19 NOTE — Telephone Encounter (Signed)
Tandem .  rx sent to pharmacy

## 2012-04-28 ENCOUNTER — Other Ambulatory Visit: Payer: Self-pay | Admitting: Internal Medicine

## 2012-04-28 NOTE — Telephone Encounter (Signed)
she was presceirbed #90 of the higher dose vicodin 7.01/4999 on July 18th.  pklease ask her if she has taken 90 of these in 12 days

## 2012-04-28 NOTE — Telephone Encounter (Signed)
I spoke with patient, she stated she is not out of her medication and stated she did not call and ask for a refill on Vicodin.

## 2012-05-03 ENCOUNTER — Encounter: Payer: Self-pay | Admitting: Internal Medicine

## 2012-08-01 ENCOUNTER — Encounter: Payer: Self-pay | Admitting: Internal Medicine

## 2012-08-24 ENCOUNTER — Telehealth: Payer: Self-pay | Admitting: Internal Medicine

## 2012-08-24 MED ORDER — FERROUS FUM-IRON POLYSACCH 162-115.2 MG PO CAPS: 1.0000 | ORAL_CAPSULE | Freq: Every day | ORAL | Status: DC

## 2012-08-24 NOTE — Telephone Encounter (Signed)
Refill request for tandem 162-112 mg cap #30 Sig: take one capsule each morning with breakfast

## 2012-08-24 NOTE — Telephone Encounter (Signed)
Left message on patient vm to call me back regarding RX clarification.

## 2012-08-24 NOTE — Telephone Encounter (Signed)
Tandem 162-115.2 mg 30 3 R sent electronic to [facility]

## 2012-09-15 ENCOUNTER — Encounter: Payer: Self-pay | Admitting: Internal Medicine

## 2012-09-15 ENCOUNTER — Ambulatory Visit (INDEPENDENT_AMBULATORY_CARE_PROVIDER_SITE_OTHER): Payer: Medicare Other | Admitting: Internal Medicine

## 2012-09-15 ENCOUNTER — Other Ambulatory Visit (HOSPITAL_COMMUNITY)
Admission: RE | Admit: 2012-09-15 | Discharge: 2012-09-15 | Disposition: A | Discharge status: Home / Self Care | Payer: Medicare Other | Source: Ambulatory Visit | Attending: Internal Medicine | Admitting: Internal Medicine

## 2012-09-15 VITALS — BP 110/64 | HR 70 | Temp 98.1°F | Ht 62.0 in | Wt 124.2 lb

## 2012-09-15 DIAGNOSIS — Z1151 Encounter for screening for human papillomavirus (HPV): Secondary | ICD-10-CM | POA: Insufficient documentation

## 2012-09-15 DIAGNOSIS — E876 Hypokalemia: Secondary | ICD-10-CM

## 2012-09-15 DIAGNOSIS — N390 Urinary tract infection, site not specified: Secondary | ICD-10-CM

## 2012-09-15 DIAGNOSIS — Z124 Encounter for screening for malignant neoplasm of cervix: Secondary | ICD-10-CM

## 2012-09-15 DIAGNOSIS — Z79899 Other long term (current) drug therapy: Secondary | ICD-10-CM

## 2012-09-15 DIAGNOSIS — R32 Unspecified urinary incontinence: Secondary | ICD-10-CM

## 2012-09-15 DIAGNOSIS — M810 Age-related osteoporosis without current pathological fracture: Secondary | ICD-10-CM

## 2012-09-15 DIAGNOSIS — Z01419 Encounter for gynecological examination (general) (routine) without abnormal findings: Secondary | ICD-10-CM | POA: Insufficient documentation

## 2012-09-15 DIAGNOSIS — D649 Anemia, unspecified: Secondary | ICD-10-CM

## 2012-09-15 DIAGNOSIS — F502 Bulimia nervosa, unspecified: Secondary | ICD-10-CM

## 2012-09-15 DIAGNOSIS — M130 Polyarthritis, unspecified: Secondary | ICD-10-CM

## 2012-09-15 DIAGNOSIS — Z0000 Encounter for general adult medical examination without abnormal findings: Secondary | ICD-10-CM

## 2012-09-15 DIAGNOSIS — R4182 Altered mental status, unspecified: Secondary | ICD-10-CM

## 2012-09-15 LAB — URINALYSIS, ROUTINE W REFLEX MICROSCOPIC
Renal Epithel, UA: NONE SEEN
Specific Gravity, Urine: >=1.03 (ref 1.000–1.030)
Urine Glucose: NEGATIVE

## 2012-09-15 LAB — COMPREHENSIVE METABOLIC PANEL
AST: 30 U/L (ref 0–37)
Albumin: 4.1 g/dL (ref 3.5–5.2)
Alkaline Phosphatase: 78 U/L (ref 39–117)
BUN: 18 mg/dL (ref 6–23)
Glucose, Bld: 90 mg/dL (ref 70–99)
Potassium: 3.4 mEq/L — ABNORMAL LOW (ref 3.5–5.1)
Sodium: 137 mEq/L (ref 135–145)
Total Bilirubin: 0.8 mg/dL (ref 0.3–1.2)
Total Protein: 7.3 g/dL (ref 6.0–8.3)

## 2012-09-15 LAB — IRON AND TIBC
Iron: 86 ug/dL (ref 42–145)
TIBC: 285 ug/dL (ref 250–470)
UIBC: 199 ug/dL (ref 125–400)

## 2012-09-15 MED ORDER — SOLIFENACIN SUCCINATE 5 MG PO TABS: 10.0000 mg | ORAL_TABLET | Freq: Every day | ORAL | Status: DC

## 2012-09-15 NOTE — Progress Notes (Signed)
Patient ID: Meagan Zavala, female   DOB: 04/12/1946, 66 y.o.   MRN: [id number]  The patient is here for annual Medicare wellness examination and management of other chronic and acute problems. Has concerns about memory failing her, used to work with patients suffering from dementia  Needs b12 checked, her tsh and rpr were normal this summer. Needs PAP .  Urinary incontiencne . Needs refills on evrything including vicodin for management of post uri cough.     The risk factors are reflected in the social history.  The roster of all physicians providing medical care to patient - is listed in the Snapshot section of the chart.  Activities of daily living:  The patient is 100% independent in all ADLs: dressing, toileting, feeding as well as independent mobility  Home safety : The patient has smoke detectors in the home. They wear seatbelts.  There are no firearms at home. There is no violence in the home.   There is no risks for hepatitis, STDs or HIV. There is no   history of blood transfusion. They have no travel history to infectious disease endemic areas of the world.  The patient has seen their dentist in the last six month. They have seen their eye doctor in the last year. They admit to slight hearing difficulty with regard to whispered voices and some television programs.  They have deferred audiologic testing in the last year.  They do not  have excessive sun exposure. Discussed the need for sun protection: hats, long sleeves and use of sunscreen if there is significant sun exposure.   Diet: the importance of a healthy diet is discussed. They do have a healthy diet.  The benefits of regular aerobic exercise were discussed. She walks 4 times per week ,  20 minutes.   Depression screen: there are no signs or vegative symptoms of depression- irritability, change in appetite, anhedonia, sadness/tearfullness.  Cognitive assessment: the patient manages all their financial and personal affairs and  is actively engaged. They could relate day,date,year and events; recalled 2/3 objects at 3 minutes; performed clock-face test normally.  The following portions of the patient's history were reviewed and updated as appropriate: allergies, current medications, past family history, past medical history,  past surgical history, past social history  and problem list.  Visual acuity was not assessed per patient preference since she has regular follow up with her ophthalmologist. Hearing and body mass index were assessed and reviewed.   During the course of the visit the patient was educated and counseled about appropriate screening and preventive services including : fall prevention , diabetes screening, nutrition counseling, colorectal cancer screening, and recommended immunizations.    Objective  BP 110/64  Pulse 70  Temp 98.1 F (36.7 C) (Oral)  Ht 5\' 2"  (1.575 m)  Wt 124 lb 4 oz (56.359 kg)  BMI 22.73 kg/m2  SpO2 98%  General Appearance:    Alert, cooperative, no distress, appears stated age  Head:    Normocephalic, without obvious abnormality, atraumatic  Eyes:    PERRL, conjunctiva/corneas clear, EOM's intact, fundi    benign, both eyes  Ears:    Normal TM's and external ear canals, both ears  Nose:   Nares normal, septum midline, mucosa normal, no drainage    or sinus tenderness  Throat:   Lips, mucosa, and tongue normal; teeth and gums normal  Neck:   Supple, symmetrical, trachea midline, no adenopathy;    thyroid:  no enlargement/tenderness/nodules; no carotid   bruit  or JVD  Back:     Symmetric, no curvature, ROM normal, no CVA tenderness  Lungs:     Clear to auscultation bilaterally, respirations unlabored  Chest Wall:    No tenderness or deformity   Heart:    Regular rate and rhythm, S1 and S2 normal, no murmur, rub   or gallop  Breast Exam:    No tenderness, masses, or nipple abnormality  Abdomen:     Soft, non-tender, bowel sounds active all four quadrants,    no masses,  no organomegaly  Genitalia:    Normal female without lesion, discharge or tenderness  Rectal:    Normal tone, normal prostate, no masses or tenderness;   guaiac negative stool  Extremities:   Extremities normal, atraumatic, no cyanosis or edema  Pulses:   2+ and symmetric all extremities  Skin:   Skin color, texture, turgor normal, no rashes or lesions  Lymph nodes:   Cervical, supraclavicular, and axillary nodes normal  Neurologic:   CNII-XII intact, normal strength, sensation and reflexes    throughout    Assessment and Plan  OSTEOPOROSIS Managed with  Reclast .  Change in mental status Cognitive changes reported by patient.  Normal serologies,  Return for MMSE   BULIMIA Wt stable, potassimu low,  Will check mg level   UTI (lower urinary tract infection) With increased incontinencne and abnormal UA. empiric treatment with cipro  Routine general medical examination at a health care facility Breast pelvic and PAP done today.  Up to date on all screenings .   Polyarthritis Referred to Dr Gavin Potters.  Serologies negative.    Updated Medication List Outpatient Encounter Prescriptions as of 09/15/2012  Medication Sig Dispense Refill  . calcitonin, salmon, (MIACALCIN/FORTICAL) 200 UNIT/ACT nasal spray 1 spray by Nasal route daily.        . calcium citrate-vitamin D 200-200 MG-UNIT TABS Take 1 tablet by mouth daily.        . ferrous fumarate-iron polysaccharide complex (TANDEM) 162-115.2 MG CAPS Take 1 capsule by mouth daily with breakfast.  30 capsule  3  . FLUoxetine (PROZAC) 40 MG capsule Take 1 capsule (40 mg total) by mouth daily.  90 capsule  3  . Multiple Vitamins-Minerals (ICAPS PO) Take by mouth.      Marland Kitchen omeprazole (PRILOSEC) 40 MG capsule Take 1 capsule (40 mg total) by mouth daily.  93 capsule  3  . traMADol (ULTRAM) 50 MG tablet Take 1-2 tablets (50-100 mg total) by mouth every 6 (six) hours as needed.  180 tablet  3  . traZODone (DESYREL) 50 MG tablet Take 1 tablet (50  mg total) by mouth daily.  93 tablet  3  . Vitamin D, Ergocalciferol, (DRISDOL) 50000 UNITS CAPS Take 50,000 Units by mouth every 7 (seven) days.       . zoledronic acid (RECLAST) 5 MG/100ML SOLN Every year.      . ciprofloxacin (CIPRO) 250 MG tablet Take 1 tablet (250 mg total) by mouth 2 (two) times daily.  10 tablet  0  . polyethylene glycol powder (GLYCOLAX/MIRALAX) powder MIX 17GM IN 8 OUNCE OF WATER AND DRINK ONCE A DAY  527 g  11  . solifenacin (VESICARE) 5 MG tablet Take 2 tablets (10 mg total) by mouth daily.  21 tablet  0

## 2012-09-15 NOTE — Patient Instructions (Signed)
Try the samples of vesicare  For your urinary incontinence.  The maximum dose is 10 mg daily  .  We will have you return for a memory test.   Labs/urine test today

## 2012-09-16 ENCOUNTER — Other Ambulatory Visit: Payer: Self-pay

## 2012-09-16 DIAGNOSIS — Z0000 Encounter for general adult medical examination without abnormal findings: Secondary | ICD-10-CM | POA: Insufficient documentation

## 2012-09-16 DIAGNOSIS — N390 Urinary tract infection, site not specified: Secondary | ICD-10-CM | POA: Insufficient documentation

## 2012-09-16 MED ORDER — CIPROFLOXACIN HCL 250 MG PO TABS: 250.0000 mg | ORAL_TABLET | Freq: Two times a day (BID) | ORAL | Status: DC

## 2012-09-16 NOTE — Assessment & Plan Note (Signed)
Wt stable, potassimu low,  Will check mg level

## 2012-09-16 NOTE — Assessment & Plan Note (Signed)
Managed with  Reclast .

## 2012-09-16 NOTE — Assessment & Plan Note (Signed)
Breast pelvic and PAP done today.  Up to date on all screenings .

## 2012-09-16 NOTE — Assessment & Plan Note (Signed)
With increased incontinencne and abnormal UA. empiric treatment with cipro

## 2012-09-16 NOTE — Assessment & Plan Note (Signed)
Referred to Dr Gavin Potters.  Serologies negative.

## 2012-09-16 NOTE — Assessment & Plan Note (Signed)
Cognitive changes reported by patient.  Normal serologies,  Return for MMSE

## 2012-09-19 ENCOUNTER — Ambulatory Visit: Payer: Medicare Other

## 2012-09-19 DIAGNOSIS — E876 Hypokalemia: Secondary | ICD-10-CM

## 2012-09-26 ENCOUNTER — Telehealth: Payer: Self-pay | Admitting: Internal Medicine

## 2012-09-26 NOTE — Telephone Encounter (Signed)
Patient Information:  Caller Name: Roselin  Phone: [phone]  Patient: Meagan Zavala, Meagan Zavala  Gender: Female  DOB: Dec 13, 1945  Age: 66 Years  PCP: Duncan Dull (Adults only)  Office Follow Up:  Does the office need to follow up with this patient?: No  Instructions For The Office: N/A   Symptoms  Reason For Call & Symptoms: Chest congestion and cough  Reviewed Health History In EMR: Yes  Reviewed Medications In EMR: Yes  Reviewed Allergies In EMR: Yes  Reviewed Surgeries / Procedures: Yes  Date of Onset of Symptoms: 08/28/2012  Guideline(s) Used:  Cough  Disposition Per Guideline:   See Within 3 Days in Office  Reason For Disposition Reached:   Cough has been present for > 10 days  Advice Given:  Reassurance  Coughing is the way that our lungs remove irritants and mucus. It helps protect our lungs from getting pneumonia.  You can get a dry hacking cough after a chest cold. Sometimes this type of cough can last 1-3 weeks, and be worse at night.  You can also get a cough after being exposed to irritating substances like smoke, strong perfumes, and dust.  Cough Medicines:  OTC Cough Drops: Cough drops can help a lot, especially for mild coughs. They reduce coughing by soothing your irritated throat and removing that tickle sensation in the back of the throat. Cough drops also have the advantage of portability - you can carry them with you.  Home Remedy - Hard Candy: Hard candy works just as well as medicine-flavored OTC cough drops. Diabetics should use sugar-free candy.  Home Remedy - Honey: This old home remedy has been shown to help decrease coughing at night. The adult dosage is 2 teaspoons (10 ml) at bedtime. Honey should not be given to infants under one year of age.  OTC Cough Syrup - Dextromethorphan:  Cough syrups containing the cough suppressant dextromethorphan (DM) may help decrease your cough. Cough syrups work best for coughs that keep you awake at night. They can also  sometimes help in the late stages of a respiratory infection when the cough is dry and hacking. They can be used along with cough drops.  Coughing Spasms:  Drink warm fluids. Inhale warm mist (Reason: both relax the airway and loosen up the phlegm).  Suck on cough drops or hard candy to coat the irritated throat.  Prevent Dehydration:  Drink adequate liquids.  Avoid Tobacco Smoke:  Smoking or being exposed to smoke makes coughs much worse.  Call Back If:  Difficulty breathing  Cough lasts more than 3 weeks  Fever lasts > 3 days  You become worse.  Appointment Scheduled:  09/29/2012 11:15:00 Appointment Scheduled Provider:  Duncan Dull (Adults only)

## 2012-09-27 ENCOUNTER — Other Ambulatory Visit: Payer: Self-pay | Admitting: Internal Medicine

## 2012-09-27 NOTE — Telephone Encounter (Signed)
Refill on Fluoxetine 60 mg tablets qty 93

## 2012-09-29 ENCOUNTER — Ambulatory Visit: Payer: Self-pay | Admitting: Internal Medicine

## 2012-09-29 ENCOUNTER — Encounter: Payer: Self-pay | Admitting: Internal Medicine

## 2012-09-29 ENCOUNTER — Ambulatory Visit (INDEPENDENT_AMBULATORY_CARE_PROVIDER_SITE_OTHER): Payer: Medicare Other | Admitting: Internal Medicine

## 2012-09-29 VITALS — BP 112/62 | HR 77 | Temp 98.2°F | Resp 16 | Wt 131.0 lb

## 2012-09-29 DIAGNOSIS — R053 Chronic cough: Secondary | ICD-10-CM | POA: Insufficient documentation

## 2012-09-29 DIAGNOSIS — N3945 Continuous leakage: Secondary | ICD-10-CM

## 2012-09-29 DIAGNOSIS — R05 Cough: Secondary | ICD-10-CM

## 2012-09-29 DIAGNOSIS — R413 Other amnesia: Secondary | ICD-10-CM

## 2012-09-29 MED ORDER — OXYBUTYNIN CHLORIDE 5 MG PO TABS: 5.0000 mg | ORAL_TABLET | Freq: Three times a day (TID) | ORAL | Status: DC

## 2012-09-29 MED ORDER — FLUOXETINE HCL 60 MG PO TABS: 1.0000 | ORAL_TABLET | Freq: Every day | ORAL | Status: DC

## 2012-09-29 MED ORDER — POLYETHYLENE GLYCOL 3350 17 GM/SCOOP PO POWD: 17.0000 g | Freq: Every day | ORAL | Status: DC

## 2012-09-29 NOTE — Patient Instructions (Addendum)
We will mail you prescriptions  for all meds 90 days supplies, except for the Vit D and the prozac.  If the miralax is not strong enough for constipation  Try Ex lax, dulcolax or senna  MRI of brain to be arranged  Chest x ray to be done at [facility]

## 2012-09-29 NOTE — Assessment & Plan Note (Signed)
Mini-Mental Status exam was performed today. She had trouble with calculations and recall of all 3 objects at 1 minute.  Geometric drawing is fine. Clock drawing was incomplete. She can name 10 animals in 1 minute and 3 words beginning with the letter  F  in 1 minute. She is already had serologic testing for B12 deficiencies and thyroid deficiency. She has not been sexually active in over 30 years so syphilis screen is unnecessary. We will order an MRI of the brain for evaluation and consider starting Aricept.

## 2012-09-29 NOTE — Assessment & Plan Note (Addendum)
Given her persistent cough and wanted a history of tobacco abuse I am sending her for PA and lateral chest x-ray to rule out pneumonia. If her chest x-ray is normal I would recommend a CT of the chest to rule out pulmonary nodule or mass given her history of tobacco abuse.

## 2012-09-29 NOTE — Progress Notes (Signed)
Patient ID: Meagan Zavala, female   DOB: 1946/01/17, 67 y.o.   MRN: [id number]   Patient Active Problem List  Diagnosis  . BULIMIA  . Major depressive disorder, recurrent episode, severe  . GERD  . KIDNEY DISEASE, CHRONIC, STAGE III  . LOW BACK PAIN, CHRONIC  . OSTEOPOROSIS  . LEAKAGE, CONTINUOUS URINE  . Change in mental status  . Pneumonia  . Insomnia  . Neck muscle strain  . Neck pain  . Macular degeneration disease  . Polyarthritis  . UTI (lower urinary tract infection)  . Routine general medical examination at a health care facility  . Memory loss, short term    Subjective:  CC:   Chief Complaint  Patient presents with  . Croup    cough, worse in nite than day  . throat drainage    used OTC meds    HPI:   Meagan Zavala a 67 y.o. female who presents for fpllowup on multiple issues raised at her annual wellness exam.  1) cold symptoms which were addressed during her physical in December. She states that she had a myriad of Cold symptoms for one week on Dec 19th.  Sinus and cough symptoms initially worsened and then improved. However she continues to have thick tenacious postnasal drip. 2) Persistent nocturnal cough.   She continues to have a daily nocturnal cough and is concerned that she has lung cancer because of her 50-pack-year history of smoking  And her father's history of lung cancer. She has a history of tobacco abuse but quit in  smoking 2 years ago after pneumonia. she is requesting a chest CT but has not had a chest x-ray. 3) Persistent  progressive memory problems . she has been having difficulty remembering details including appointments and directions. She's worried about developing dementia. She is requesting evaluation for memory loss. 4) urinary incontinence. She tolerated the samples of Vesicare but did develop some constipation with daily use. She cannot afford the Vesicare due to insurance and is wondering if there was a generic substitute     Past  Medical History  Diagnosis Date  . BULIMIA many decades of this    patient keeps alive by drinking ensure that she cannot purge  . Major depressive disorder, recurrent episode, severe     well treated with SSRI's  . KIDNEY DISEASE, CHRONIC, STAGE III 2010    Cr 1.3 -> .84, probably from NSAID's and lasix  . LOW BACK PAIN, CHRONIC     takes tylenol  . Basal cell carcinoma of face   . LEAKAGE, CONTINUOUS URINE 07/30/2009  . Macular degeneration disease     Dingledein, now App[place]eller  . OSTEOPOROSIS     severe, treated with infusions, followed by endocrine in [facility]    Past Surgical History  Procedure Date  . 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    Review of Systems:   Patient denies headache, fevers, malaise, unintentional weight loss, skin rash, eye pain, sinus congestion and sinus pain, sore throat, dysphagia,  hemoptysis ,  dyspnea, wheezing, chest pain, palpitations, orthopnea, edema, abdominal pain, nausea, melena, diarrhea, constipation, flank pain, dysuria, hematuria, urinary  Frequency, nocturia, numbness, tingling, seizures,  Focal weakness, Loss of consciousness,  Tremor, insomnia, depression, anxiety, and suicidal ideation.       History  Social History  . Marital Status: Single    Spouse Name: N/A    Number of Children: N/A  . Years of Education: N/A   Occupational History  . Not on file.   Social History Main Topics  . Smoking status: Former Smoker    Quit date: 10/02/2010  . Smokeless tobacco: Never Used  . 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 Drug Use: No  . Sexually Active: Not on file   Other Topics Concern  . Not on file   Social History Narrative   Patient has been living alone. She is on disability. She goes to church and has support  there. She has a very supportive cousin, Eleonor Ocon: [phone].    Objective:  BP 112/62  Pulse 77  Temp 98.2 F (36.8 C) (Oral)  Resp 16  Wt 131 lb (59.421 kg)  SpO2 97%  General appearance: alert, cooperative and appears stated age Ears: normal TM's and external ear canals both ears Throat: lips, mucosa, and tongue normal; teeth and gums normal Neck: no adenopathy, no carotid bruit, supple, symmetrical, trachea midline and thyroid not enlarged, symmetric, no tenderness/mass/nodules Back: symmetric, no curvature. ROM normal. No CVA tenderness. Lungs: clear to auscultation bilaterally Heart: regular rate and rhythm, S1, S2 normal, no murmur, click, rub or gallop Abdomen: soft, non-tender; bowel sounds normal; no masses,  no organomegaly Pulses: 2+ and symmetric Skin: Skin color, texture, turgor normal. No rashes or lesions Lymph nodes: Cervical, supraclavicular, and axillary nodes normal. Neuro:  Her MMSE exam was notable for loss of recall all 3 objects at 1 minute. Difficulty in calculations.  Could name 10 animals, and 3 words starting with "f" in 60 seconds      Assessment and Plan: Memory loss, short term Mini-Mental Status exam was performed today. She had trouble with calculations and recall of all 3 objects at 1 minute.  Geometric drawing is fine. Clock drawing was incomplete. She can name 10 animals in 1 minute and 3 words beginning with the letter  F  in 1 minute. She is already had serologic testing for B12 deficiencies and thyroid deficiency. She has not been sexually active in over 30 years so syphilis screen is unnecessary. We will order an MRI of the brain for evaluation and consider starting Aricept.  LEAKAGE, CONTINUOUS URINE the samples of Vesicare were helpful but did cause mild constipation. She would like to continue medication but cannot afford it. Trial of generic oxybutynin at bedtime. Advised to resume MiraLAX for constipation.  Chronic cough Given  her persistent cough and wanted a history of tobacco abuse I am sending her for PA and lateral chest x-ray to rule out pneumonia. If her chest x-ray is normal I would recommend a CT of the chest to rule out pulmonary nodule or mass given her history of tobacco abuse.   Updated Medication List Outpatient Encounter Prescriptions as of 09/29/2012  Medication Sig Dispense Refill  . calcium citrate-vitamin D 200-200 MG-UNIT TABS Take 3 tablets by mouth daily.       . ferrous fumarate-iron polysaccharide complex (TANDEM) 162-115.2 MG CAPS Take 1 capsule by mouth daily with breakfast.  30 capsule  3  . Multiple Vitamins-Minerals (ICAPS PO) Take by mouth.      Marland Kitchen omeprazole (PRILOSEC) 40 MG capsule Take 1 capsule (40 mg total) by mouth daily.  93 capsule  3  . traMADol (ULTRAM) 50 MG tablet Take 1-2  tablets (50-100 mg total) by mouth every 6 (six) hours as needed.  180 tablet  3  . traZODone (DESYREL) 50 MG tablet Take 1 tablet (50 mg total) by mouth daily.  93 tablet  3  . Vitamin D, Ergocalciferol, (DRISDOL) 50000 UNITS CAPS Take 50,000 Units by mouth every 7 (seven) days.       . zoledronic acid (RECLAST) 5 MG/100ML SOLN Every year.      . [DISCONTINUED] FLUoxetine (PROZAC) 40 MG capsule Take 1 capsule (40 mg total) by mouth daily.  90 capsule  3  . calcitonin, salmon, (MIACALCIN/FORTICAL) 200 UNIT/ACT nasal spray 1 spray by Nasal route daily.        . ciprofloxacin (CIPRO) 250 MG tablet Take 1 tablet (250 mg total) by mouth 2 (two) times daily.  10 tablet  0  . FLUoxetine HCl 60 MG TABS Take 1 tablet by mouth daily.  90 tablet  1  . oxybutynin (DITROPAN) 5 MG tablet Take 1 tablet (5 mg total) by mouth 3 (three) times daily.  90 tablet  3  . polyethylene glycol powder (GLYCOLAX/MIRALAX) powder Take 17 g by mouth daily.  850 g  3  . solifenacin (VESICARE) 5 MG tablet Take 2 tablets (10 mg total) by mouth daily.  21 tablet  0  . [DISCONTINUED] polyethylene glycol powder (GLYCOLAX/MIRALAX) powder MIX 17GM  IN 8 OUNCE OF WATER AND DRINK ONCE A DAY  527 g  11

## 2012-09-29 NOTE — Assessment & Plan Note (Signed)
the samples of Vesicare were helpful but did cause mild constipation. She would like to continue medication but cannot afford it. Trial of generic oxybutynin at bedtime. Advised to resume MiraLAX for constipation.

## 2012-09-30 NOTE — Telephone Encounter (Signed)
REFILL MED SENT TO PHARMACY PER DR. Darrick Huntsman

## 2012-10-03 ENCOUNTER — Telehealth: Payer: Self-pay | Admitting: Internal Medicine

## 2012-10-03 DIAGNOSIS — R05 Cough: Secondary | ICD-10-CM

## 2012-10-03 NOTE — Telephone Encounter (Signed)
LMOVM for pt to return call 

## 2012-10-03 NOTE — Telephone Encounter (Signed)
chest x ray was normal.  I will see if I can get a CT chest appproved by insurnace

## 2012-10-04 ENCOUNTER — Ambulatory Visit: Payer: Self-pay | Admitting: Internal Medicine

## 2012-10-05 ENCOUNTER — Telehealth: Payer: Self-pay | Admitting: Internal Medicine

## 2012-10-05 DIAGNOSIS — R32 Unspecified urinary incontinence: Secondary | ICD-10-CM

## 2012-10-05 DIAGNOSIS — F0150 Vascular dementia without behavioral disturbance: Secondary | ICD-10-CM

## 2012-10-05 MED ORDER — DONEPEZIL HCL 5 MG PO TABS: 5.0000 mg | ORAL_TABLET | Freq: Every evening | ORAL | Status: DC | PRN

## 2012-10-05 MED ORDER — SOLIFENACIN SUCCINATE 5 MG PO TABS: 10.0000 mg | ORAL_TABLET | Freq: Every day | ORAL | Status: DC

## 2012-10-05 MED ORDER — POLYETHYLENE GLYCOL 3350 17 GM/SCOOP PO POWD: 17.0000 g | Freq: Every day | ORAL | Status: DC

## 2012-10-05 MED ORDER — FLUOXETINE HCL 60 MG PO TABS: 1.0000 | ORAL_TABLET | Freq: Every day | ORAL | Status: DC

## 2012-10-05 NOTE — Telephone Encounter (Signed)
Her MRI was abnormal. There were no masses or strokes but there were areas throughout the brain that looked over they had been chronically deprived of adequate oxygen of time due to inadequate blood flow.  This means that her memory deficits are not due to Alzheimer's but due to vascular dementia. It still may respond to Aricept. I  recommended she start taking a baby aspirin daily, that we have  check her fasting lipids, and consider a trial of Aricept. She should followup in one month. Marland Kitchen

## 2012-10-05 NOTE — Telephone Encounter (Signed)
Pt notified and appts made

## 2012-10-05 NOTE — Telephone Encounter (Signed)
Do you know if this has been approved by insurance?

## 2012-10-06 ENCOUNTER — Ambulatory Visit: Payer: Self-pay | Admitting: Internal Medicine

## 2012-10-06 ENCOUNTER — Telehealth: Payer: Self-pay | Admitting: Internal Medicine

## 2012-10-06 DIAGNOSIS — F0150 Vascular dementia without behavioral disturbance: Secondary | ICD-10-CM

## 2012-10-06 DIAGNOSIS — F0390 Unspecified dementia without behavioral disturbance: Secondary | ICD-10-CM

## 2012-10-06 NOTE — Telephone Encounter (Signed)
Caller: [name]/Patient; Phone: [phone]; Reason for Call: Patient received results and orders from her MRI.  She is concerned about waiting another month before seeing someone.  Patient would like to be referred to a specialist at this time.  You may call the home number provided or the cell phone: [phone].  Please call the patient regarding referral request.

## 2012-10-06 NOTE — Telephone Encounter (Signed)
An there is no urgent neurology referral for dementia but I will initiate a referral to Dr. Sherryll Burger of [facility]

## 2012-10-07 NOTE — Telephone Encounter (Signed)
Pt.notified

## 2012-10-12 ENCOUNTER — Other Ambulatory Visit: Payer: Self-pay | Admitting: General Practice

## 2012-10-12 MED ORDER — OXYBUTYNIN CHLORIDE 5 MG PO TABS: 5.0000 mg | ORAL_TABLET | Freq: Three times a day (TID) | ORAL | Status: DC

## 2012-10-18 ENCOUNTER — Encounter: Payer: Self-pay | Admitting: Internal Medicine

## 2012-10-19 ENCOUNTER — Encounter: Payer: Self-pay | Admitting: Internal Medicine

## 2012-11-03 ENCOUNTER — Other Ambulatory Visit (INDEPENDENT_AMBULATORY_CARE_PROVIDER_SITE_OTHER): Payer: Medicare Other

## 2012-11-03 ENCOUNTER — Other Ambulatory Visit: Payer: Self-pay | Admitting: *Deleted

## 2012-11-03 ENCOUNTER — Telehealth: Payer: Self-pay | Admitting: *Deleted

## 2012-11-03 DIAGNOSIS — F0150 Vascular dementia without behavioral disturbance: Secondary | ICD-10-CM

## 2012-11-03 LAB — LIPID PANEL
Cholesterol: 169 mg/dL (ref 0–200)
LDL Cholesterol: 107 mg/dL — ABNORMAL HIGH (ref 0–99)
Triglycerides: 62 mg/dL (ref 0.0–149.0)
VLDL: 12.4 mg/dL (ref 0.0–40.0)

## 2012-11-03 MED ORDER — TRAMADOL HCL 50 MG PO TABS: 50.0000 mg | ORAL_TABLET | Freq: Four times a day (QID) | ORAL | Status: DC | PRN

## 2012-11-03 NOTE — Telephone Encounter (Signed)
What labs and dx would you like? Just the lipid panel?

## 2012-11-03 NOTE — Telephone Encounter (Signed)
Yes, use 272.4

## 2012-11-03 NOTE — Telephone Encounter (Signed)
Pt last seen on 09/29/12 and med last filled on 12/12. Ok to fill?

## 2012-11-08 ENCOUNTER — Encounter: Payer: Self-pay | Admitting: Internal Medicine

## 2012-11-08 ENCOUNTER — Ambulatory Visit (INDEPENDENT_AMBULATORY_CARE_PROVIDER_SITE_OTHER): Payer: Medicare Other | Admitting: Internal Medicine

## 2012-11-08 VITALS — BP 112/60 | HR 60 | Temp 98.0°F | Resp 16 | Wt 130.8 lb

## 2012-11-08 DIAGNOSIS — R05 Cough: Secondary | ICD-10-CM

## 2012-11-08 DIAGNOSIS — R413 Other amnesia: Secondary | ICD-10-CM

## 2012-11-08 DIAGNOSIS — G3184 Mild cognitive impairment, so stated: Secondary | ICD-10-CM

## 2012-11-08 NOTE — Patient Instructions (Addendum)
Try taking 10 mg of bladder medication in the morning and 5 in the evening   Or vice versa (maxium 15 mg daily )   stop the iron  Your cholesterol is fine ; you do not need cholesterol medication   Ok to to take 2 stool softeners daily  Try almond mill original or vanilla flavor ,  It has lots of good protein and calcium

## 2012-11-09 ENCOUNTER — Encounter: Payer: Self-pay | Admitting: Internal Medicine

## 2012-11-09 NOTE — Assessment & Plan Note (Signed)
Patient requested neurologic evaluation which has been done. She has been taking the aricept and aspirin since the neurology evaluation .  Her cholesterol is normal.

## 2012-11-09 NOTE — Assessment & Plan Note (Signed)
Chest x ray done on Jan 2 was normal.  Chest CT was done which noted no masses or lymphadenopathy but commented on decreased conspicuouness of the groundf glass opacities "seen previously on bilateral lower lobes

## 2012-11-09 NOTE — Progress Notes (Signed)
Patient ID: Meagan Zavala, female   DOB: April 07, 1946, 67 y.o.   MRN: [id number]  Patient Active Problem List  Diagnosis  . BULIMIA  . Major depressive disorder, recurrent episode, severe  . GERD  . KIDNEY DISEASE, CHRONIC, STAGE III  . LOW BACK PAIN, CHRONIC  . OSTEOPOROSIS  . LEAKAGE, CONTINUOUS URINE  . MCI (mild cognitive impairment) with memory loss  . Insomnia  . Neck pain  . Macular degeneration disease  . Polyarthritis  . Routine general medical examination at a health care facility  . Memory loss, short term  . Chronic cough    Subjective:  CC:   Chief Complaint  Patient presents with  . Follow-up    HPI:   Meagan Zavala a 67 y.o. female who presents for follow up on recent neurologic evaluation for mild cognitive changes.  After her MRI was abnormal, she requested Neurology evaluation  Because she had been increasingly concerned and disheartened about developing dementia that might progress given her lack of familial support.  She feels better since her evaluation.  She is tolerating the aricept but states that it has made her constipated.  She was also treated with ditropan at the same time.  She would like to review her medications to see if anything can be eliminated.     Past Medical History  Diagnosis Date  . BULIMIA many decades of this    patient keeps alive by drinking ensure that she cannot purge  . Major depressive disorder, recurrent episode, severe     well treated with SSRI's  . KIDNEY DISEASE, CHRONIC, STAGE III 2010    Cr 1.3 -> .84, probably from NSAID's and lasix  . LOW BACK PAIN, CHRONIC     takes tylenol  . Basal cell carcinoma of face   . LEAKAGE, CONTINUOUS URINE 07/30/2009  . Macular degeneration disease     Dingledein, now [place]eller  . OSTEOPOROSIS     severe, treated with infusions, followed by endocrine in [facility]    Past Surgical History  Procedure Laterality Date  . 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    Review of Systems:  Patient denies headache, fevers, malaise, unintentional weight loss, skin rash, eye pain, sinus congestion and sinus pain, sore throat, dysphagia,  hemoptysis , cough, dyspnea, wheezing, chest pain, palpitations, orthopnea, edema, abdominal pain, nausea, melena, diarrhea, constipation, flank pain, dysuria, hematuria, urinary  Frequency, nocturia, numbness, tingling, seizures,  Focal weakness, Loss of consciousness,  Tremor, insomnia, depression, anxiety, and suicidal ideation.     History   Social History  . Marital Status: Single    Spouse Name: N/A    Number of Children: N/A  . Years of Education: N/A   Occupational History  . Not on file.   Social History Main Topics  . Smoking status: Former Smoker    Quit date: 10/02/2010  . Smokeless tobacco: Never Used  . 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 Drug Use: No  . Sexually Active: Not on file   Other Topics Concern  . Not on file   Social History Narrative   Patient has been living alone. She is on disability. She goes to church and has support there. She has a very supportive  cousin, Vertie Dibbern: [phone].    Objective:  BP 112/60  Pulse 60  Temp(Src) 98 F (36.7 C) (Oral)  Resp 16  Wt 130 lb 12 oz (59.308 kg)  BMI 23.91 kg/m2  SpO2 96%  General appearance: alert, cooperative and appears stated age Neck: no adenopathy, no carotid bruit, supple, symmetrical, trachea midline and thyroid not enlarged, symmetric, no tenderness/mass/nodules Back: symmetric, no curvature. ROM normal. No CVA tenderness. Lungs: clear to auscultation bilaterally Heart: regular rate and rhythm, S1, S2 normal, no murmur, click, rub or gallop Abdomen: soft, non-tender; bowel sounds normal; no masses,  no organomegaly Pulses:  2+ and symmetric Skin: Skin color, texture, turgor normal. No rashes or lesions Lymph nodes: Cervical, supraclavicular, and axillary nodes normal.  Assessment and Plan:  Chronic cough Chest x ray done on Jan 2 was normal.  Chest CT was done which noted no masses or lymphadenopathy but commented on decreased conspicuouness of the groundf glass opacities "seen previously on bilateral lower lobes  MCI (mild cognitive impairment) with memory loss Patient requested neurologic evaluation which has been done. She has been taking the aricept and aspirin since the neurology evaluation .  Her cholesterol is normal.   A total of 40 minutes of face to face time was spent with patient , more than half of which was spent in counseling and coordination of care.    Updated Medication List Outpatient Encounter Prescriptions as of 11/08/2012  Medication Sig Dispense Refill  . aspirin 81 MG tablet Take 81 mg by mouth daily.      . calcium citrate-vitamin D 200-200 MG-UNIT TABS Take 3 tablets by mouth daily.       Marland Kitchen donepezil (ARICEPT) 5 MG tablet Take 1 tablet (5 mg total) by mouth at bedtime as needed.  30 tablet  2  . ferrous fumarate-iron polysaccharide complex (TANDEM) 162-115.2 MG CAPS Take 1 capsule by mouth daily with breakfast.  30 capsule  3  . fish oil-omega-3 fatty acids 1000 MG capsule Take 1 capsule by mouth daily.      Marland Kitchen FLUoxetine HCl 60 MG TABS Take 1 tablet by mouth daily.  90 tablet  1  . HYDROcodone-acetaminophen (LORTAB) 7.5-500 MG per tablet Take 1 tablet by mouth every 6 (six) hours as needed for pain.      . Multiple Vitamins-Minerals (ICAPS PO) Take by mouth.      Marland Kitchen omeprazole (PRILOSEC) 40 MG capsule Take 1 capsule (40 mg total) by mouth daily.  93 capsule  3  . oxybutynin (DITROPAN) 5 MG tablet Take 1 tablet (5 mg total) by mouth 3 (three) times daily.  90 tablet  3  . polyethylene glycol powder (GLYCOLAX/MIRALAX) powder Take 17 g by mouth daily.  850 g  3  . traMADol (ULTRAM) 50  MG tablet Take 1-2 tablets (50-100 mg total) by mouth every 6 (six) hours as needed.  180 tablet  3  . traZODone (DESYREL) 50 MG tablet Take 1 tablet (50 mg total) by mouth daily.  93 tablet  3  . Vitamin D, Ergocalciferol, (DRISDOL) 50000 UNITS CAPS Take 50,000 Units by mouth every 7 (seven) days.       . vitamin E 400 UNIT capsule Take 400 Units by mouth daily.      . zoledronic acid (RECLAST) 5 MG/100ML SOLN Every year.      . calcitonin, salmon, (MIACALCIN/FORTICAL) 200 UNIT/ACT nasal spray 1 spray by Nasal route daily.        . solifenacin (VESICARE) 5 MG  tablet Take 2 tablets (10 mg total) by mouth daily.  90 tablet  1  . [DISCONTINUED] ciprofloxacin (CIPRO) 250 MG tablet Take 1 tablet (250 mg total) by mouth 2 (two) times daily.  10 tablet  0   No facility-administered encounter medications on file as of 11/08/2012.     No orders of the defined types were placed in this encounter.    Return in about 3 months (around 02/05/2013).

## 2012-12-19 ENCOUNTER — Other Ambulatory Visit: Payer: Self-pay | Admitting: Internal Medicine

## 2012-12-19 NOTE — Telephone Encounter (Signed)
Med filled on 3/24.  

## 2012-12-21 ENCOUNTER — Other Ambulatory Visit: Payer: Self-pay | Admitting: *Deleted

## 2012-12-21 MED ORDER — TRAZODONE HCL 50 MG PO TABS: 50.0000 mg | ORAL_TABLET | Freq: Every day | ORAL | Status: DC

## 2012-12-21 MED ORDER — OMEPRAZOLE 40 MG PO CPDR: 40.0000 mg | DELAYED_RELEASE_CAPSULE | Freq: Every day | ORAL | Status: DC

## 2012-12-21 MED ORDER — DONEPEZIL HCL 5 MG PO TABS: ORAL_TABLET | ORAL | Status: DC

## 2012-12-21 NOTE — Telephone Encounter (Signed)
Med filled . Ok to fill Fluoxetine?

## 2012-12-22 MED ORDER — FLUOXETINE HCL 60 MG PO TABS: 1.0000 | ORAL_TABLET | Freq: Every day | ORAL | Status: DC

## 2012-12-22 NOTE — Telephone Encounter (Signed)
Med filled on 3/27

## 2013-02-08 ENCOUNTER — Other Ambulatory Visit: Payer: Self-pay | Admitting: Internal Medicine

## 2013-02-15 ENCOUNTER — Encounter: Payer: Self-pay | Admitting: Internal Medicine

## 2013-02-15 ENCOUNTER — Ambulatory Visit (INDEPENDENT_AMBULATORY_CARE_PROVIDER_SITE_OTHER): Payer: Medicare Other | Admitting: Internal Medicine

## 2013-02-15 VITALS — BP 108/70 | HR 68 | Temp 98.4°F | Resp 14 | Wt 120.5 lb

## 2013-02-15 DIAGNOSIS — R413 Other amnesia: Secondary | ICD-10-CM

## 2013-02-15 DIAGNOSIS — F332 Major depressive disorder, recurrent severe without psychotic features: Secondary | ICD-10-CM

## 2013-02-15 DIAGNOSIS — R32 Unspecified urinary incontinence: Secondary | ICD-10-CM

## 2013-02-15 DIAGNOSIS — G3184 Mild cognitive impairment, so stated: Secondary | ICD-10-CM

## 2013-02-15 MED ORDER — OXYBUTYNIN CHLORIDE ER 15 MG PO TB24: 15.0000 mg | ORAL_TABLET | Freq: Every day | ORAL | Status: DC

## 2013-02-15 MED ORDER — HYDROCODONE-ACETAMINOPHEN 10-325 MG PO TABS: 1.0000 | ORAL_TABLET | Freq: Four times a day (QID) | ORAL | Status: DC | PRN

## 2013-02-15 MED ORDER — PRAVASTATIN SODIUM 20 MG PO TABS: 20.0000 mg | ORAL_TABLET | Freq: Every day | ORAL | Status: DC

## 2013-02-15 NOTE — Progress Notes (Signed)
Patient ID: Meagan Zavala, female   DOB: 1946-04-26, 67 y.o.   MRN: [id number]  Patient Active Problem List   Diagnosis Date Noted  . Chronic cough 09/29/2012  . Routine general medical examination at a health care facility 09/16/2012  . Polyarthritis 04/16/2012  . Macular degeneration disease   . Neck pain 03/03/2011  . Insomnia 11/25/2010  . MCI (mild cognitive impairment) with memory loss 11/18/2010  . BULIMIA 07/30/2009  . Major depressive disorder, recurrent episode, severe 07/30/2009  . GERD 07/30/2009  . KIDNEY DISEASE, CHRONIC, STAGE III 07/30/2009  . LOW BACK PAIN, CHRONIC 07/30/2009  . OSTEOPOROSIS 07/30/2009  . Urinary incontinence 07/30/2009    Subjective:  CC:   Chief Complaint  Patient presents with  . Follow-up    HPI:   Meagan Zavala a 67 y.o. female who presents for follow up on recent diagnosis of mld/early vascular dementia.  After her MRI was abnormal, she requested Neurology evaluation  Because she had been increasingly concerned and disheartened about developing dementia that might progress given her lack of familial support.  She feels better since her evaluation.  She is tolerating the aricept but states that it has made her constipated.  She was also treated with ditropan at the same time.  She would like to review her medications to see if anything can be eliminated.   Still very anxious about her prognosis, but tolerating the aricept,  Has not lost any weight,  Sleeping ok.  Thinks the aricept may be helping.  Not getting lost or having difficulty with ADLs. She ah   Past Medical History  Diagnosis Date  . BULIMIA many decades of this    patient keeps alive by drinking ensure that she cannot purge  . Major depressive disorder, recurrent episode, severe     well treated with SSRI's  . KIDNEY DISEASE, CHRONIC, STAGE III 2010    Cr 1.3 -> .84, probably from NSAID's and lasix  . LOW BACK PAIN, CHRONIC     takes tylenol  . Basal cell carcinoma of  face   . LEAKAGE, CONTINUOUS URINE 07/30/2009  . Macular degeneration disease     Dingledein, now Appenzeller  . OSTEOPOROSIS     severe, treated with infusions, followed by endocrine in [facility]    Past Surgical History  Procedure Laterality Date  . 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    Review of Systems:   Patient denies headache, fevers, malaise, unintentional weight loss, skin rash, eye pain, sinus congestion and sinus pain, sore throat, dysphagia,  hemoptysis , cough, dyspnea, wheezing, chest pain, palpitations, orthopnea, edema, abdominal pain, nausea, melena, diarrhea, constipation, flank pain, dysuria, hematuria, urinary  Frequency, nocturia, numbness, tingling, seizures,  Focal weakness, Loss of consciousness,  Tremor, insomnia, depression, anxiety, and suicidal ideation.      History   Social History  . Marital Status: Single    Spouse Name: N/A    Number of Children: N/A  . Years of Education: N/A   Occupational History  . Not on file.   Social History Main Topics  . Smoking status: Former Smoker    Quit date: 10/02/2010  . Smokeless tobacco: Never Used  . 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 Drug Use: No  .  Sexually Active: Not on file   Other Topics Concern  . Not on file   Social History Narrative   Patient has been living alone. She is on disability. She goes to church and has support there. She has a very supportive cousin, Konstantina Nachreiner: [phone].    Objective:  BP 108/70  Pulse 68  Temp(Src) 98.4 F (36.9 C) (Oral)  Resp 14  Wt 120 lb 8 oz (54.658 kg)  BMI 22.03 kg/m2  SpO2 97%  General appearance: cachectic, alert, cooperative and appears stated age Ears: normal TM's and external ear canals both ears Throat: lips, mucosa, and tongue  normal; teeth and gums normal Neck: no adenopathy, no carotid bruit, supple, symmetrical, trachea midline and thyroid not enlarged, symmetric, no tenderness/mass/nodules Back: symmetric, no curvature. ROM normal. No CVA tenderness. Lungs: clear to auscultation bilaterally Heart: regular rate and rhythm, S1, S2 normal, no murmur, click, rub or gallop Abdomen: soft, non-tender; bowel sounds normal; no masses,  no organomegaly Pulses: 2+ and symmetric Skin: Skin color, texture, turgor normal. No rashes or lesions Lymph nodes: Cervical, supraclavicular, and axillary nodes normal.  Assessment and Plan:  MCI (mild cognitive impairment) with memory loss Tolerating aricept better,  Dose has been increased to 10 mg daily . Discussed adding statin and ASA and she is very motivated to try.  Risks and benefits discussed. Trial of pravastatin, returen in 6 weeks for lfts   Major depressive disorder, recurrent episode, severe Symptoms somewhat improved since last visit.  Not suicidal,  Continue current prozac regimen.   Urinary incontinence Changing ditropan to LA due to patient request for reduction in pull burden.    Updated Medication List Outpatient Encounter Prescriptions as of 02/15/2013  Medication Sig Dispense Refill  . aspirin 81 MG tablet Take 81 mg by mouth daily.      . calcitonin, salmon, (MIACALCIN/FORTICAL) 200 UNIT/ACT nasal spray 1 spray by Nasal route daily.        . calcium citrate-vitamin D 200-200 MG-UNIT TABS Take 3 tablets by mouth daily.       Marland Kitchen donepezil (ARICEPT) 5 MG tablet 10 mg. TAKE ONE TABLET BY MOUTH AT BEDTIME AS NEEDED      . ferrous fumarate-iron polysaccharide complex (TANDEM) 162-115.2 MG CAPS Take 1 capsule by mouth daily with breakfast.  30 capsule  3  . fish oil-omega-3 fatty acids 1000 MG capsule Take 1 capsule by mouth daily.      Marland Kitchen FLUoxetine HCl 60 MG TABS Take 1 tablet by mouth daily.  90 tablet  1  . Multiple Vitamins-Minerals (ICAPS PO) Take by mouth.       Marland Kitchen omeprazole (PRILOSEC) 40 MG capsule Take 1 capsule (40 mg total) by mouth daily.  90 capsule  2  . polyethylene glycol powder (GLYCOLAX/MIRALAX) powder Take 17 g by mouth daily.  850 g  3  . solifenacin (VESICARE) 5 MG tablet Take 2 tablets (10 mg total) by mouth daily.  90 tablet  1  . traMADol (ULTRAM) 50 MG tablet Take 1-2 tablets (50-100 mg total) by mouth every 6 (six) hours as needed.  180 tablet  3  . traZODone (DESYREL) 50 MG tablet Take 1 tablet (50 mg total) by mouth daily.  90 tablet  1  . Vitamin D, Ergocalciferol, (DRISDOL) 50000 UNITS CAPS Take 50,000 Units by mouth every 7 (seven) days.       . vitamin E 400 UNIT capsule Take 400 Units by mouth daily.      . zoledronic  acid (RECLAST) 5 MG/100ML SOLN Every year.      . [DISCONTINUED] donepezil (ARICEPT) 5 MG tablet TAKE ONE TABLET BY MOUTH AT BEDTIME AS NEEDED  90 tablet  1  . [DISCONTINUED] HYDROcodone-acetaminophen (LORTAB) 7.5-500 MG per tablet Take 1 tablet by mouth every 6 (six) hours as needed for pain.      . [DISCONTINUED] oxybutynin (DITROPAN) 5 MG tablet TAKE ONE TABLET BY MOUTH THREE TIMES DAILY  90 tablet  0  . HYDROcodone-acetaminophen (NORCO) 10-325 MG per tablet Take 1 tablet by mouth every 6 (six) hours as needed for pain.  90 tablet  2  . oxybutynin (DITROPAN XL) 15 MG 24 hr tablet Take 1 tablet (15 mg total) by mouth daily.  30 tablet  11  . pravastatin (PRAVACHOL) 20 MG tablet Take 1 tablet (20 mg total) by mouth daily.  90 tablet  3   No facility-administered encounter medications on file as of 02/15/2013.     No orders of the defined types were placed in this encounter.    No Follow-up on file.

## 2013-02-15 NOTE — Patient Instructions (Addendum)
You can stop the fish oil  Changing the bladder medicine to once daily ditropan XL  Adding pravastatin 20 mg for the arteries ,  This with the aspirin is protective of the heart and the brain   Return in 3 months for bloodwork and an office visit

## 2013-02-17 ENCOUNTER — Encounter: Payer: Self-pay | Admitting: Internal Medicine

## 2013-02-17 NOTE — Assessment & Plan Note (Addendum)
Tolerating aricept better,  Dose has been increased to 10 mg daily . Discussed adding statin and ASA and she is very motivated to try.  Risks and benefits discussed. Trial of pravastatin, returen in 6 weeks for lfts

## 2013-02-17 NOTE — Assessment & Plan Note (Signed)
Changing ditropan to LA due to patient request for reduction in pull burden.

## 2013-02-17 NOTE — Assessment & Plan Note (Addendum)
Symptoms somewhat improved since last visit.  Not suicidal,  Continue current prozac regimen.

## 2013-04-18 ENCOUNTER — Other Ambulatory Visit (INDEPENDENT_AMBULATORY_CARE_PROVIDER_SITE_OTHER): Payer: Medicare Other

## 2013-04-18 DIAGNOSIS — F0150 Vascular dementia without behavioral disturbance: Secondary | ICD-10-CM

## 2013-04-18 LAB — LIPID PANEL
Cholesterol: 145 mg/dL (ref 0–200)
HDL: 56.1 mg/dL (ref >39.00)
LDL Cholesterol: 76 mg/dL (ref 0–99)
Triglycerides: 63 mg/dL (ref 0.0–149.0)
VLDL: 12.6 mg/dL (ref 0.0–40.0)

## 2013-04-19 ENCOUNTER — Encounter: Payer: Self-pay | Admitting: *Deleted

## 2013-05-01 ENCOUNTER — Other Ambulatory Visit: Payer: Medicare Other

## 2013-05-08 ENCOUNTER — Ambulatory Visit (INDEPENDENT_AMBULATORY_CARE_PROVIDER_SITE_OTHER): Payer: Self-pay | Admitting: Internal Medicine

## 2013-05-08 ENCOUNTER — Encounter: Payer: Self-pay | Admitting: Internal Medicine

## 2013-05-08 VITALS — BP 102/52 | HR 81 | Temp 98.4°F | Resp 12 | Wt 120.5 lb

## 2013-05-08 DIAGNOSIS — R7989 Other specified abnormal findings of blood chemistry: Secondary | ICD-10-CM

## 2013-05-08 DIAGNOSIS — Z79899 Other long term (current) drug therapy: Secondary | ICD-10-CM

## 2013-05-08 DIAGNOSIS — M810 Age-related osteoporosis without current pathological fracture: Secondary | ICD-10-CM

## 2013-05-08 DIAGNOSIS — F502 Bulimia nervosa: Secondary | ICD-10-CM

## 2013-05-08 DIAGNOSIS — F332 Major depressive disorder, recurrent severe without psychotic features: Secondary | ICD-10-CM

## 2013-05-08 LAB — COMPREHENSIVE METABOLIC PANEL
AST: 56 U/L — ABNORMAL HIGH (ref 0–37)
Alkaline Phosphatase: 68 U/L (ref 39–117)
BUN: 15 mg/dL (ref 6–23)
Calcium: 9.5 mg/dL (ref 8.4–10.5)
Creatinine, Ser: 1 mg/dL (ref 0.4–1.2)

## 2013-05-08 NOTE — Patient Instructions (Signed)
  We will mail you aor call you with the results of your labs from today  contineu the pravastatin and aspirin,  Return in 6 months ( fasting labs the day before)

## 2013-05-09 ENCOUNTER — Encounter: Payer: Self-pay | Admitting: Internal Medicine

## 2013-05-09 NOTE — Addendum Note (Signed)
Addended by: Sherlene Shams on: 05/09/2013 07:07 AM   Modules accepted: Orders

## 2013-05-09 NOTE — Assessment & Plan Note (Signed)
Weight has been stable. No changes today.
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
## 2013-05-09 NOTE — Assessment & Plan Note (Signed)
Currently in remission, aggravated by recent diagnosis of vascular dementia. Continue Prozac.

## 2013-05-09 NOTE — Assessment & Plan Note (Signed)
Managed with [facility] Dr. Johny Chess.

## 2013-05-09 NOTE — Assessment & Plan Note (Signed)
She is followed by [facility] nephrology. She's avoiding nonsteroidal anti-inflammatories and diuretics.

## 2013-06-01 ENCOUNTER — Other Ambulatory Visit: Payer: Self-pay | Admitting: Internal Medicine

## 2013-06-02 ENCOUNTER — Encounter: Payer: Self-pay | Admitting: *Deleted

## 2013-06-05 ENCOUNTER — Other Ambulatory Visit (INDEPENDENT_AMBULATORY_CARE_PROVIDER_SITE_OTHER): Payer: Medicare Other

## 2013-06-05 DIAGNOSIS — R7989 Other specified abnormal findings of blood chemistry: Secondary | ICD-10-CM

## 2013-06-05 LAB — HEPATIC FUNCTION PANEL
Alkaline Phosphatase: 58 U/L (ref 39–117)
Bilirubin, Direct: 0.1 mg/dL (ref 0.0–0.3)
Total Protein: 6.3 g/dL (ref 6.0–8.3)

## 2013-06-08 ENCOUNTER — Encounter: Payer: Self-pay | Admitting: *Deleted

## 2013-07-04 ENCOUNTER — Other Ambulatory Visit: Payer: Self-pay | Admitting: Internal Medicine

## 2013-07-17 ENCOUNTER — Ambulatory Visit (INDEPENDENT_AMBULATORY_CARE_PROVIDER_SITE_OTHER): Payer: Medicare Other | Admitting: *Deleted

## 2013-07-17 DIAGNOSIS — Z23 Encounter for immunization: Secondary | ICD-10-CM

## 2013-08-10 ENCOUNTER — Other Ambulatory Visit: Payer: Self-pay | Admitting: Internal Medicine

## 2013-08-10 NOTE — Telephone Encounter (Signed)
Last visit 05/08/13, refill?

## 2013-08-10 NOTE — Telephone Encounter (Signed)
traMADol (ULTRAM) 50 MG tablet  #90

## 2013-08-11 MED ORDER — TRAMADOL HCL 50 MG PO TABS: 50.0000 mg | ORAL_TABLET | Freq: Four times a day (QID) | ORAL | Status: DC | PRN

## 2013-08-14 ENCOUNTER — Encounter: Payer: Self-pay | Admitting: Internal Medicine

## 2013-08-25 ENCOUNTER — Other Ambulatory Visit: Payer: Self-pay | Admitting: Internal Medicine

## 2013-08-25 NOTE — Telephone Encounter (Signed)
Refilled trazodone #90 with no refills.  

## 2013-08-29 ENCOUNTER — Other Ambulatory Visit: Payer: Medicare Other

## 2013-08-31 ENCOUNTER — Telehealth: Payer: Self-pay | Admitting: *Deleted

## 2013-08-31 DIAGNOSIS — E559 Vitamin D deficiency, unspecified: Secondary | ICD-10-CM

## 2013-08-31 DIAGNOSIS — E785 Hyperlipidemia, unspecified: Secondary | ICD-10-CM

## 2013-08-31 DIAGNOSIS — R5381 Other malaise: Secondary | ICD-10-CM

## 2013-08-31 DIAGNOSIS — F502 Bulimia nervosa: Secondary | ICD-10-CM

## 2013-08-31 NOTE — Telephone Encounter (Signed)
Pt is coming in for labs 12.05.2014 what labs and dx?

## 2013-09-01 ENCOUNTER — Other Ambulatory Visit: Payer: Medicare Other

## 2013-09-01 ENCOUNTER — Encounter: Payer: Self-pay | Admitting: Internal Medicine

## 2013-09-01 ENCOUNTER — Ambulatory Visit (INDEPENDENT_AMBULATORY_CARE_PROVIDER_SITE_OTHER): Payer: Medicare Other | Admitting: Internal Medicine

## 2013-09-01 VITALS — BP 100/60 | HR 60 | Temp 97.9°F | Resp 12 | Ht 62.0 in | Wt 126.5 lb

## 2013-09-01 DIAGNOSIS — R413 Other amnesia: Secondary | ICD-10-CM

## 2013-09-01 DIAGNOSIS — N390 Urinary tract infection, site not specified: Secondary | ICD-10-CM

## 2013-09-01 DIAGNOSIS — E785 Hyperlipidemia, unspecified: Secondary | ICD-10-CM | POA: Insufficient documentation

## 2013-09-01 DIAGNOSIS — E559 Vitamin D deficiency, unspecified: Secondary | ICD-10-CM | POA: Insufficient documentation

## 2013-09-01 DIAGNOSIS — G3184 Mild cognitive impairment, so stated: Secondary | ICD-10-CM

## 2013-09-01 DIAGNOSIS — R300 Dysuria: Secondary | ICD-10-CM

## 2013-09-01 DIAGNOSIS — F332 Major depressive disorder, recurrent severe without psychotic features: Secondary | ICD-10-CM

## 2013-09-01 LAB — POCT URINALYSIS DIPSTICK
Bilirubin, UA: NEGATIVE
Glucose, UA: NEGATIVE
Ketones, UA: NEGATIVE
Spec Grav, UA: >=1.03
Urobilinogen, UA: 0.2

## 2013-09-01 MED ORDER — DONEPEZIL HCL 10 MG PO TABS: 10.0000 mg | ORAL_TABLET | Freq: Every day | ORAL | Status: DC

## 2013-09-01 MED ORDER — TRAZODONE HCL 50 MG PO TABS: ORAL_TABLET | ORAL | Status: DC

## 2013-09-01 MED ORDER — CIPROFLOXACIN HCL 250 MG PO TABS: 250.0000 mg | ORAL_TABLET | Freq: Two times a day (BID) | ORAL | Status: DC

## 2013-09-01 NOTE — Progress Notes (Signed)
Patient ID: Meagan Zavala, female   DOB: 03-Jul-1946, 67 y.o.   MRN: [id number]  Patient Active Problem List   Diagnosis Date Noted  . UTI (urinary tract infection) 09/03/2013  . Other and unspecified hyperlipidemia 09/01/2013  . Unspecified vitamin D deficiency 09/01/2013  . Chronic cough 09/29/2012  . Routine general medical examination at a health care facility 09/16/2012  . Polyarthritis 04/16/2012  . Macular degeneration disease   . Neck pain 03/03/2011  . Insomnia 11/25/2010  . MCI (mild cognitive impairment) with memory loss 11/18/2010  . BULIMIA 07/30/2009  . Major depressive disorder, recurrent episode, severe 07/30/2009  . GERD 07/30/2009  . KIDNEY DISEASE, CHRONIC, STAGE III 07/30/2009  . LOW BACK PAIN, CHRONIC 07/30/2009  . OSTEOPOROSIS 07/30/2009  . Urinary incontinence 07/30/2009    Subjective:  CC:   Chief Complaint  Patient presents with  . Dysuria    started over a week ago, cloudy urine and dysuria  . Insomnia    trazodone not currently working    HPI:   Meagan Zavala a 67 y.o. female who presents for follow up on chronic conditions including anxiety , mild cognitive impairment,  CKD and urinary incontinence,  She has been having dysuria for one week.   No recent travel ,  Sex or swimming .  Has not taken antibiotics  Trouble sleeping due to anxiety , Since her sister died a year ago her sister's grown children have been sponging off of her .  Neither are working ,  Neither have working licenses due to losing them to l DWIs.  They are both using marijuana  and relying on patient to support them   .     Past Medical History  Diagnosis Date  . BULIMIA many decades of this    patient keeps alive by drinking ensure that she cannot purge  . Major depressive disorder, recurrent episode, severe     well treated with SSRI's  . KIDNEY DISEASE, CHRONIC, STAGE III 2010    Cr 1.3 -> .84, probably from NSAID's and lasix  . LOW BACK PAIN, CHRONIC     takes  tylenol  . Basal cell carcinoma of face   . LEAKAGE, CONTINUOUS URINE 07/30/2009  . Macular degeneration disease     Dingledein, now Appenzeller  . OSTEOPOROSIS     severe, treated with infusions, followed by endocrine in [facility]    Past Surgical History  Procedure Laterality Date  . 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    Review of Systems:   12 Pt  review of systems was negative except those addressed in the HPI,     History   Social History  . Marital Status: Single    Spouse Name: N/A    Number of Children: N/A  . Years of Education: N/A   Occupational History  . Not on file.   Social History Main Topics  . Smoking status: Former Smoker    Quit date: 10/02/2010  . Smokeless tobacco: Never Used  . 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 Drug Use: No  . Sexual Activity: Not on file   Other Topics Concern  . Not on file   Social History Narrative   Patient has been living  alone. She is on disability. She goes to church and has support there. She has a very supportive cousin, Eleen Litz: [phone].    Objective:  Filed Vitals:   09/01/13 1052  BP: 100/60  Pulse: 60  Temp: 97.9 F (36.6 C)  Resp: 12     General appearance: alert, cooperative and appears stated age Ears: normal TM's and external ear canals both ears Throat: lips, mucosa, and tongue normal; teeth and gums normal Neck: no adenopathy, no carotid bruit, supple, symmetrical, trachea midline and thyroid not enlarged, symmetric, no tenderness/mass/nodules Back: symmetric, no curvature. ROM normal. No CVA tenderness. Lungs: clear to auscultation bilaterally Heart: regular rate and rhythm, S1, S2 normal, no murmur, click, rub or gallop Abdomen: soft, non-tender; bowel sounds normal;  no masses,  no organomegaly Pulses: 2+ and symmetric Skin: Skin color, texture, turgor normal. No rashes or lesions Lymph nodes: Cervical, supraclavicular, and axillary nodes normal.  Assessment and Plan:  UTI (urinary tract infection) Treating empirically pending urine culture.   MCI (mild cognitive impairment) with memory loss No progression ,  Vascular dementia suspected given changes on MRI>   KIDNEY DISEASE, CHRONIC, STAGE III Stable by recent evaluation. Secondary to NSAIDs and Lasix .  Lab Results  Component Value Date   CREATININE 1.0 05/08/2013    Major depressive disorder, recurrent episode, severe Complicated by current anxiety over family situation. counselling given.    Updated Medication List Outpatient Encounter Prescriptions as of 09/01/2013  Medication Sig  . aspirin 81 MG tablet Take 81 mg by mouth daily.  . calcium citrate-vitamin D 200-200 MG-UNIT TABS Take 3 tablets by mouth daily.   Marland Kitchen docusate sodium (COLACE) 100 MG capsule Take 100 mg by mouth 2 (two) times daily.  Marland Kitchen donepezil (ARICEPT) 10 MG tablet Take 1 tablet (10 mg total) by mouth at bedtime. TAKE ONE TABLET BY MOUTH AT BEDTIME AS NEEDED  . fish oil-omega-3 fatty acids 1000 MG capsule Take 1 capsule by mouth daily.  Marland Kitchen FLUoxetine (PROZAC) 40 MG capsule TAKE ONE CAPSULE BY MOUTH EVERY DAY in summer  . FLUoxetine HCl 60 MG TABS Take 1 tablet by mouth daily. In winter  . HYDROcodone-acetaminophen (NORCO) 10-325 MG per tablet Take 1 tablet by mouth every 6 (six) hours as needed for pain.  . Multiple Vitamins-Minerals (EYE VITAMINS PO) Take by mouth.  . Multiple Vitamins-Minerals (ICAPS PO) Take by mouth.  Marland Kitchen omeprazole (PRILOSEC) 40 MG capsule Take 1 capsule (40 mg total) by mouth daily.  Marland Kitchen oxybutynin (DITROPAN XL) 15 MG 24 hr tablet Take 1 tablet (15 mg total) by mouth daily.  . polyethylene glycol powder (GLYCOLAX/MIRALAX) powder Take 17 g by mouth daily.  . pravastatin (PRAVACHOL) 20 MG tablet Take 1  tablet (20 mg total) by mouth daily.  . traMADol (ULTRAM) 50 MG tablet Take 1-2 tablets (50-100 mg total) by mouth every 6 (six) hours as needed.  . traZODone (DESYREL) 50 MG tablet TAKE ONE OR TWO  TABLETS BY MOUTH ONCE DAILY  . Vitamin D, Ergocalciferol, (DRISDOL) 50000 UNITS CAPS Take 50,000 Units by mouth every 7 (seven) days.   . vitamin E 400 UNIT capsule Take 400 Units by mouth daily.  . zoledronic acid (RECLAST) 5 MG/100ML SOLN Every year.  . [DISCONTINUED] donepezil (ARICEPT) 5 MG tablet 10 mg. TAKE ONE TABLET BY MOUTH AT BEDTIME AS NEEDED  . [DISCONTINUED] FLUoxetine (PROZAC) 40 MG capsule TAKE ONE CAPSULE BY MOUTH EVERY DAY  . [DISCONTINUED] FLUoxetine HCl 60 MG TABS Take 1 tablet  by mouth daily.  . [DISCONTINUED] traZODone (DESYREL) 50 MG tablet TAKE ONE TABLET BY MOUTH ONCE DAILY  . ciprofloxacin (CIPRO) 250 MG tablet Take 1 tablet (250 mg total) by mouth 2 (two) times daily.  . [DISCONTINUED] calcitonin, salmon, (MIACALCIN/FORTICAL) 200 UNIT/ACT nasal spray 1 spray by Nasal route daily.    . [DISCONTINUED] ferrous fumarate-iron polysaccharide complex (TANDEM) 162-115.2 MG CAPS Take 1 capsule by mouth daily with breakfast.  . [DISCONTINUED] solifenacin (VESICARE) 5 MG tablet Take 2 tablets (10 mg total) by mouth daily.     Orders Placed This Encounter  Procedures  . POCT Urinalysis Dipstick    No Follow-up on file.

## 2013-09-01 NOTE — Progress Notes (Signed)
Pre visit review using our clinic review tool, if applicable. No additional management support is needed unless otherwise documented below in the visit note. 

## 2013-09-03 ENCOUNTER — Encounter: Payer: Self-pay | Admitting: Internal Medicine

## 2013-09-03 DIAGNOSIS — N390 Urinary tract infection, site not specified: Secondary | ICD-10-CM | POA: Insufficient documentation

## 2013-09-03 NOTE — Assessment & Plan Note (Addendum)
Stable by recent evaluation. Secondary to NSAIDs and Lasix .  Lab Results  Component Value Date   CREATININE 1.0 05/08/2013

## 2013-09-03 NOTE — Assessment & Plan Note (Signed)
Complicated by current anxiety over family situation. counselling given.

## 2013-09-03 NOTE — Assessment & Plan Note (Signed)
Treating empirically pending urine culture.

## 2013-09-03 NOTE — Assessment & Plan Note (Signed)
No progression ,  Vascular dementia suspected given changes on MRI>

## 2013-09-05 ENCOUNTER — Other Ambulatory Visit: Payer: Medicare Other

## 2013-09-18 ENCOUNTER — Other Ambulatory Visit: Payer: Self-pay | Admitting: Internal Medicine

## 2013-10-11 ENCOUNTER — Other Ambulatory Visit: Payer: Medicare Other

## 2013-10-12 ENCOUNTER — Other Ambulatory Visit: Payer: Medicare Other

## 2013-11-09 ENCOUNTER — Other Ambulatory Visit (INDEPENDENT_AMBULATORY_CARE_PROVIDER_SITE_OTHER): Payer: Medicare HMO

## 2013-11-09 DIAGNOSIS — E785 Hyperlipidemia, unspecified: Secondary | ICD-10-CM

## 2013-11-09 DIAGNOSIS — R5381 Other malaise: Secondary | ICD-10-CM

## 2013-11-09 DIAGNOSIS — N183 Chronic kidney disease, stage 3 unspecified: Secondary | ICD-10-CM

## 2013-11-09 DIAGNOSIS — E559 Vitamin D deficiency, unspecified: Secondary | ICD-10-CM

## 2013-11-09 DIAGNOSIS — R5383 Other fatigue: Secondary | ICD-10-CM

## 2013-11-09 LAB — LIPID PANEL
CHOL/HDL RATIO: 2
Cholesterol: 141 mg/dL (ref 0–200)
HDL: 68.2 mg/dL (ref >39.00)
LDL Cholesterol: 65 mg/dL (ref 0–99)
Triglycerides: 40 mg/dL (ref 0.0–149.0)
VLDL: 8 mg/dL (ref 0.0–40.0)

## 2013-11-09 LAB — COMPREHENSIVE METABOLIC PANEL
ALBUMIN: 3.4 g/dL — AB (ref 3.5–5.2)
ALT: 78 U/L — ABNORMAL HIGH (ref 0–35)
AST: 37 U/L (ref 0–37)
Alkaline Phosphatase: 89 U/L (ref 39–117)
BUN: 12 mg/dL (ref 6–23)
CALCIUM: 9.5 mg/dL (ref 8.4–10.5)
CHLORIDE: 106 meq/L (ref 96–112)
CO2: 29 mEq/L (ref 19–32)
Creatinine, Ser: 0.9 mg/dL (ref 0.4–1.2)
GFR: 65.02 mL/min (ref >60.00)
Glucose, Bld: 82 mg/dL (ref 70–99)
POTASSIUM: 3.9 meq/L (ref 3.5–5.1)
SODIUM: 143 meq/L (ref 135–145)
Total Bilirubin: 0.7 mg/dL (ref 0.3–1.2)
Total Protein: 6.1 g/dL (ref 6.0–8.3)

## 2013-11-09 LAB — TSH: TSH: 3.25 u[IU]/mL (ref 0.35–5.50)

## 2013-11-10 LAB — VITAMIN D 25 HYDROXY (VIT D DEFICIENCY, FRACTURES): VIT D 25 HYDROXY: 72 ng/mL (ref 30–89)

## 2013-11-13 ENCOUNTER — Telehealth: Payer: Self-pay | Admitting: Emergency Medicine

## 2013-11-13 NOTE — Telephone Encounter (Signed)
Pt approved to see Dr. Melrose Nakayama with 3 visits exp 02/06/14. Auth # [id number]

## 2013-12-05 ENCOUNTER — Encounter: Payer: Self-pay | Admitting: Internal Medicine

## 2013-12-05 ENCOUNTER — Encounter (INDEPENDENT_AMBULATORY_CARE_PROVIDER_SITE_OTHER): Payer: Self-pay

## 2013-12-05 ENCOUNTER — Ambulatory Visit (INDEPENDENT_AMBULATORY_CARE_PROVIDER_SITE_OTHER): Payer: Medicare HMO | Admitting: Internal Medicine

## 2013-12-05 VITALS — BP 122/68 | HR 71 | Temp 98.9°F | Resp 16 | Wt 131.5 lb

## 2013-12-05 DIAGNOSIS — K219 Gastro-esophageal reflux disease without esophagitis: Secondary | ICD-10-CM

## 2013-12-05 DIAGNOSIS — F332 Major depressive disorder, recurrent severe without psychotic features: Secondary | ICD-10-CM

## 2013-12-05 DIAGNOSIS — G3184 Mild cognitive impairment, so stated: Secondary | ICD-10-CM

## 2013-12-05 DIAGNOSIS — R413 Other amnesia: Secondary | ICD-10-CM

## 2013-12-05 DIAGNOSIS — K209 Esophagitis, unspecified without bleeding: Secondary | ICD-10-CM

## 2013-12-05 DIAGNOSIS — G4700 Insomnia, unspecified: Secondary | ICD-10-CM

## 2013-12-05 DIAGNOSIS — H05019 Cellulitis of unspecified orbit: Secondary | ICD-10-CM

## 2013-12-05 DIAGNOSIS — F502 Bulimia nervosa: Secondary | ICD-10-CM

## 2013-12-05 DIAGNOSIS — R079 Chest pain, unspecified: Secondary | ICD-10-CM

## 2013-12-05 MED ORDER — SULFAMETHOXAZOLE-TRIMETHOPRIM 800-160 MG PO TABS: 1.0000 | ORAL_TABLET | Freq: Two times a day (BID) | ORAL | Status: DC

## 2013-12-05 MED ORDER — ASPIRIN 81 MG PO TABS: 81.0000 mg | ORAL_TABLET | Freq: Every day | ORAL | Status: DC

## 2013-12-05 MED ORDER — POLYMYXIN B-TRIMETHOPRIM 10000-0.1 UNIT/ML-% OP SOLN: 1.0000 [drp] | OPHTHALMIC | Status: DC

## 2013-12-05 NOTE — Progress Notes (Signed)
Pre-visit discussion using our clinic review tool. No additional management support is needed unless otherwise documented below in the visit note.  

## 2013-12-05 NOTE — Patient Instructions (Addendum)
I am calling you in Septra DS to take by mouth,  And an antibiotic ointment to use on your eye ball/eyelid  For a week.  Take the antibioti cwith food to prevent nausea   Please take a probiotic ( Align, Floraque or Culturelle) while you are on the antibiotic to prevent a serious antibiotic associated diarrhea  Called clostirudium dificile colitis and a vaginal yeast infection   We are going to set you up for a barium swallow study to rule out hiatal hernia  Please take your omeprazole in the morning on an empty stomach

## 2013-12-05 NOTE — Progress Notes (Signed)
Patient ID: Meagan Zavala, female   DOB: November 27, 1945, 67 y.o.   MRN: [id number]  Patient Active Problem List   Diagnosis Date Noted  . Orbital cellulitis 12/06/2013  . Other and unspecified hyperlipidemia 09/01/2013  . Unspecified vitamin D deficiency 09/01/2013  . Chronic cough 09/29/2012  . Routine general medical examination at a health care facility 09/16/2012  . Polyarthritis 04/16/2012  . Macular degeneration disease   . Neck pain 03/03/2011  . Insomnia 11/25/2010  . MCI (mild cognitive impairment) with memory loss 11/18/2010  . BULIMIA 07/30/2009  . Major depressive disorder, recurrent episode, severe 07/30/2009  . GERD 07/30/2009  . KIDNEY DISEASE, CHRONIC, STAGE III 07/30/2009  . LOW BACK PAIN, CHRONIC 07/30/2009  . OSTEOPOROSIS 07/30/2009  . Urinary incontinence 07/30/2009    Subjective:  CC:   Chief Complaint  Patient presents with  . Follow-up    3 month    HPI:   Meagan Zavala is a 68 y.o. 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  Denies eye pain , eyelid  feels sore  redness was up to eyelid this morning,  But has receded .Her cat also has a swollen eyelid and she has had to medicate her but denies any cat scratch to eyelid.    Recent episode of severe chest pain thatwas constant and  lasted for two days,  Accompanied by anorexia and an episode of self induced  postprandial vomiting that continaed fresh blood in the vomitus. This was an isolated episode of hematemesis  which occurred over a week ago.  Stools have been brown and formed.   She takes PPI,  omeprazole 40 mg daily but takes it in the evening.       Past Medical History  Diagnosis Date  . BULIMIA  many decades of this    patient keeps alive by drinking ensure that she cannot purge  . Major depressive disorder, recurrent episode, severe     well treated with SSRI's  . KIDNEY DISEASE, CHRONIC, STAGE III 2010    Cr 1.3 -> .84, probably from NSAID's and lasix  . LOW BACK PAIN, CHRONIC     takes tylenol  . Basal cell carcinoma of face   . LEAKAGE, CONTINUOUS URINE 07/30/2009  . Macular degeneration disease     Dingledein, now Appenzeller  . OSTEOPOROSIS     severe, treated with infusions, followed by endocrine in [facility]    Past Surgical History  Procedure Laterality Date  . 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    Review of Systems:   Patient denies headache, fevers, malaise, unintentional weight loss, skin rash, eye pain, sinus congestion and sinus pain, sore throat, dysphagia,  hemoptysis , cough, dyspnea, wheezing, persistent chest pain, palpitations, orthopnea, edema, abdominal pain, nausea, melena, diarrhea, constipation, flank pain, dysuria, hematuria, urinary  Frequency, nocturia, numbness, tingling, seizures,  Focal weakness, Loss of consciousness,  Tremor, insomnia, depression, anxiety, and suicidal ideation.     History   Social History  . Marital Status: Single  Spouse Name: N/A    Number of Children: N/A  . Years of Education: N/A   Occupational History  . Not on file.   Social History Main Topics  . Smoking status: Former Smoker    Quit date: 10/02/2010  . Smokeless tobacco: Never Used  . 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 Drug Use: No  . Sexual Activity: Not on file   Other Topics Concern  . Not on file   Social History Narrative   Patient has been living alone. She is on disability. She goes to church and has support there. She has a very  supportive cousin, Jerriyah Louis: [phone].    Objective:  Filed Vitals:   12/05/13 1429  BP: 122/68  Pulse: 71  Temp: 98.9 F (37.2 C)  Resp: 16     General appearance: alert, cooperative and appears stated age Eyes:  Left eyelid swollen and red  Ears: normal TM's and external ear canals both ears Throat: lips, mucosa, and tongue normal; teeth and gums normal Neck: no adenopathy, no carotid bruit, supple, symmetrical, trachea midline and thyroid not enlarged, symmetric, no tenderness/mass/nodules Back: symmetric, no curvature. ROM normal. No CVA tenderness. Lungs: clear to auscultation bilaterally Heart: regular rate and rhythm, S1, S2 normal, no murmur, click, rub or gallop Abdomen: soft, non-tender; bowel sounds normal; no masses,  no organomegaly Pulses: 2+ and symmetric Skin: Skin color, texture, turgor normal. No rashes or lesions Lymph nodes: Cervical, supraclavicular, and axillary nodes normal.  Assessment and Plan:  GERD With recent episode of chest pain and hematoesis secondary to esophagitis. Advised patient to take PPI in the AM on empty stomach.  UGI barium swallow to rule out hiatal hernia.   BULIMIA She continues to purge periodically.  Recent episode of hematemesis was probably a small mallory Weiss tear .  She does not want to see GI at this time  MCI (mild cognitive impairment) with memory loss She is tolerating Aricept trial   Major depressive disorder, recurrent episode, severe Complicated by current anxiety over family situation. counselling given and strongly advised to distance herself from her sister's grown children who are financially irresponsible and looking for handouts. Continue prozac and trazodone.    Insomnia Improved with trazodone./ no chagnes today   Orbital cellulitis She has normal eye movemets and no eye pain.  No systemic symptoms of fever.  Will treat for CA-MRSA with oral Septra DS x 1 week and topical polytrim. Return in  one week if no improvement.  A total of 40 minutes was spent with patient more than half of which was spent in counseling, reviewing records from other prviders and coordination of care.   Updated Medication List Outpatient Encounter Prescriptions as of 12/05/2013  Medication Sig  . aspirin 81 MG tablet Take 1 tablet (81 mg total) by mouth daily.  . calcium citrate-vitamin D 200-200 MG-UNIT TABS Take 3 tablets by mouth daily.   Marland Kitchen docusate sodium (COLACE) 100 MG capsule Take 100 mg by mouth 2 (two) times daily.  Marland Kitchen donepezil (ARICEPT) 10 MG tablet Take 1 tablet (10 mg total) by mouth at bedtime. TAKE ONE TABLET BY MOUTH AT BEDTIME AS NEEDED  . fish oil-omega-3 fatty acids 1000 MG capsule Take 1 capsule by mouth daily.  Marland Kitchen FLUoxetine HCl 60 MG TABS Take 1 tablet by mouth daily. In winter  . HYDROcodone-acetaminophen (NORCO) 10-325 MG per tablet Take 1 tablet by mouth every  6 (six) hours as needed for pain.  . Multiple Vitamins-Minerals (EYE VITAMINS PO) Take by mouth.  . Multiple Vitamins-Minerals (ICAPS PO) Take by mouth.  Marland Kitchen omeprazole (PRILOSEC) 40 MG capsule TAKE ONE CAPSULE BY MOUTH ONCE DAILY  . oxybutynin (DITROPAN XL) 15 MG 24 hr tablet Take 1 tablet (15 mg total) by mouth daily.  . polyethylene glycol powder (GLYCOLAX/MIRALAX) powder Take 17 g by mouth daily.  . pravastatin (PRAVACHOL) 20 MG tablet Take 1 tablet (20 mg total) by mouth daily.  . traMADol (ULTRAM) 50 MG tablet Take 1-2 tablets (50-100 mg total) by mouth every 6 (six) hours as needed.  . traZODone (DESYREL) 50 MG tablet TAKE ONE OR TWO  TABLETS BY MOUTH ONCE DAILY  . Vitamin D, Ergocalciferol, (DRISDOL) 50000 UNITS CAPS Take 50,000 Units by mouth every 7 (seven) days.   . vitamin E 400 UNIT capsule Take 400 Units by mouth daily.  . zoledronic acid (RECLAST) 5 MG/100ML SOLN Every year.  . [DISCONTINUED] aspirin 81 MG tablet Take 81 mg by mouth daily.  Marland Kitchen FLUoxetine (PROZAC) 40 MG capsule TAKE ONE CAPSULE BY MOUTH EVERY DAY in  summer  . sulfamethoxazole-trimethoprim (SEPTRA DS) 800-160 MG per tablet Take 1 tablet by mouth 2 (two) times daily.  Marland Kitchen trimethoprim-polymyxin b (POLYTRIM) ophthalmic solution Place 1 drop into the right eye every 4 (four) hours.  . [DISCONTINUED] ciprofloxacin (CIPRO) 250 MG tablet Take 1 tablet (250 mg total) by mouth 2 (two) times daily.  . [DISCONTINUED] sulfamethoxazole-trimethoprim (SEPTRA DS) 800-160 MG per tablet Take 1 tablet by mouth 2 (two) times daily.  . [DISCONTINUED] trimethoprim-polymyxin b (POLYTRIM) ophthalmic solution Place 1 drop into the right eye every 4 (four) hours.     Orders Placed This Encounter  Procedures  . DG Esophagus    No Follow-up on file.

## 2013-12-06 ENCOUNTER — Encounter: Payer: Self-pay | Admitting: Internal Medicine

## 2013-12-06 DIAGNOSIS — H05019 Cellulitis of unspecified orbit: Secondary | ICD-10-CM | POA: Insufficient documentation

## 2013-12-06 NOTE — Assessment & Plan Note (Addendum)
Complicated by current anxiety over family situation. counselling given and strongly advised to distance herself from her sister's grown children who are financially irresponsible and looking for handouts. Continue prozac and trazodone.

## 2013-12-06 NOTE — Assessment & Plan Note (Signed)
She is tolerating Aricept trial

## 2013-12-06 NOTE — Assessment & Plan Note (Signed)
Improved with trazodone./ no chagnes today

## 2013-12-06 NOTE — Assessment & Plan Note (Signed)
She continues to purge periodically.  Recent episode of hematemesis was probably a small mallory Weiss tear .  She does not want to see GI at this time

## 2013-12-06 NOTE — Assessment & Plan Note (Addendum)
Advised patient to take PPI in the AM on empty stomach.  UGI barium swallow to rule out hiatal hernia.

## 2013-12-06 NOTE — Assessment & Plan Note (Signed)
She has normal eye movemets and no eye pain.  No systemic symptoms of fever.  Will treat for CA-MRSA with oral Septra DS x 1 week and topical polytrim. Return in one week if no improvement.

## 2013-12-12 ENCOUNTER — Ambulatory Visit: Payer: Self-pay | Admitting: Internal Medicine

## 2013-12-14 ENCOUNTER — Telehealth: Payer: Self-pay | Admitting: Internal Medicine

## 2013-12-14 NOTE — Telephone Encounter (Signed)
Her barium swallow showed no strictures,  A small hiatal hernia , and mild motility problems of the esophagus.  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  Was she aware of these?

## 2013-12-14 NOTE — Telephone Encounter (Signed)
Notified patient of results and patient voiced understanding. Patient also stated she is aware of compression fractures of spine. FYI

## 2014-01-12 ENCOUNTER — Ambulatory Visit: Payer: Medicare HMO | Admitting: Adult Health

## 2014-01-22 ENCOUNTER — Encounter: Payer: Self-pay | Admitting: Internal Medicine

## 2014-02-11 ENCOUNTER — Other Ambulatory Visit: Payer: Self-pay | Admitting: Internal Medicine

## 2014-03-12 ENCOUNTER — Other Ambulatory Visit: Payer: Self-pay | Admitting: Internal Medicine

## 2014-03-16 ENCOUNTER — Telehealth: Payer: Self-pay

## 2014-03-16 ENCOUNTER — Ambulatory Visit (INDEPENDENT_AMBULATORY_CARE_PROVIDER_SITE_OTHER): Payer: Medicare HMO | Admitting: Internal Medicine

## 2014-03-16 ENCOUNTER — Encounter: Payer: Self-pay | Admitting: Internal Medicine

## 2014-03-16 VITALS — BP 104/60 | HR 57 | Temp 97.9°F | Resp 14 | Ht 62.0 in | Wt 127.8 lb

## 2014-03-16 DIAGNOSIS — M542 Cervicalgia: Secondary | ICD-10-CM

## 2014-03-16 DIAGNOSIS — H269 Unspecified cataract: Secondary | ICD-10-CM

## 2014-03-16 DIAGNOSIS — G8929 Other chronic pain: Secondary | ICD-10-CM

## 2014-03-16 DIAGNOSIS — E785 Hyperlipidemia, unspecified: Secondary | ICD-10-CM

## 2014-03-16 DIAGNOSIS — Z23 Encounter for immunization: Secondary | ICD-10-CM

## 2014-03-16 DIAGNOSIS — M130 Polyarthritis, unspecified: Secondary | ICD-10-CM

## 2014-03-16 DIAGNOSIS — G4700 Insomnia, unspecified: Secondary | ICD-10-CM

## 2014-03-16 DIAGNOSIS — H3530 Unspecified macular degeneration: Secondary | ICD-10-CM

## 2014-03-16 DIAGNOSIS — R748 Abnormal levels of other serum enzymes: Secondary | ICD-10-CM

## 2014-03-16 LAB — COMPREHENSIVE METABOLIC PANEL
ALK PHOS: 80 U/L (ref 39–117)
ALT: 38 U/L — AB (ref 0–35)
AST: 55 U/L — ABNORMAL HIGH (ref 0–37)
Albumin: 3.8 g/dL (ref 3.5–5.2)
BILIRUBIN TOTAL: 0.9 mg/dL (ref 0.2–1.2)
BUN: 14 mg/dL (ref 6–23)
CO2: 28 mEq/L (ref 19–32)
Calcium: 9.6 mg/dL (ref 8.4–10.5)
Chloride: 101 mEq/L (ref 96–112)
Creatinine, Ser: 0.9 mg/dL (ref 0.4–1.2)
GFR: 70.28 mL/min (ref >60.00)
Glucose, Bld: 75 mg/dL (ref 70–99)
Potassium: 3.9 mEq/L (ref 3.5–5.1)
SODIUM: 137 meq/L (ref 135–145)
TOTAL PROTEIN: 6.2 g/dL (ref 6.0–8.3)

## 2014-03-16 MED ORDER — TIZANIDINE HCL 2 MG PO TABS
2.0000 mg | ORAL_TABLET | Freq: Three times a day (TID) | ORAL | Status: DC | PRN
Start: 2014-03-16 — End: 2014-06-22

## 2014-03-16 MED ORDER — HYDROCODONE-ACETAMINOPHEN 10-325 MG PO TABS: 1.0000 | ORAL_TABLET | Freq: Four times a day (QID) | ORAL | Status: DC | PRN

## 2014-03-16 MED ORDER — ZOSTER VACCINE LIVE 19400 UNT/0.65ML ~~LOC~~ SOLR: 0.6500 mL | Freq: Once | SUBCUTANEOUS | Status: DC

## 2014-03-16 NOTE — Telephone Encounter (Signed)
Patient has approved authorization from [facility] to see Dr.Appenzeller (eye dr) from Nov 1st through May 1st  auth # [id number]   (Patient was previously approved for Dr.Appenzeller, auth # [id number])

## 2014-03-16 NOTE — Progress Notes (Signed)
Pre-visit discussion using our clinic review tool. No additional management support is needed unless otherwise documented below in the visit note.  

## 2014-03-16 NOTE — Telephone Encounter (Signed)
The patient has been approved by [facility] to see Dr.[facility] (endcrinologist)   [employer] ref # [id number] Exp 10/04/14

## 2014-03-16 NOTE — Patient Instructions (Addendum)
I am adding a muscle relaxer to help your neck pain  We are repeating your liver eyzmes today to monitor the elevation we saw in February  You received the new pneumonia vaccine today  The Shingles vaccine is advised; it will cost you less at a local pharmacy than it will if given here

## 2014-03-16 NOTE — Progress Notes (Signed)
Patient ID: Meagan Zavala, female   DOB: 01/29/1946, 68 y.o.   MRN: [id number]  Patient Active Problem List   Diagnosis Date Noted  . Other nonspecific abnormal serum enzyme levels 03/18/2014  . Orbital cellulitis 12/06/2013  . Other and unspecified hyperlipidemia 09/01/2013  . Unspecified vitamin D deficiency 09/01/2013  . Chronic cough 09/29/2012  . Routine general medical examination at a health care facility 09/16/2012  . Polyarthritis 04/16/2012  . Macular degeneration disease   . Neck pain 03/03/2011  . Insomnia 11/25/2010  . MCI (mild cognitive impairment) with memory loss 11/18/2010  . BULIMIA 07/30/2009  . Major depressive disorder, recurrent episode, severe 07/30/2009  . GERD 07/30/2009  . KIDNEY DISEASE, CHRONIC, STAGE III 07/30/2009  . LOW BACK PAIN, CHRONIC 07/30/2009  . OSTEOPOROSIS 07/30/2009  . Urinary incontinence 07/30/2009    Subjective:  CC:   Chief Complaint  Patient presents with  . Follow-up    general 3 month follow up    HPI:   Meagan Zavala is a 68 y.o. female who presents for Follow up on chronic conditions including chronic insomnia, major depressive disorder and MCI.Marland Kitchen She been having increased pain due to arthritis pain affecting left wrist, so painful it woke her up .  Both hips,  left shoulder  Now her neck is bothering her for the alast several months Neck pain starting to act up,  pain aggravated by turning,  nonradiating    Past Medical History  Diagnosis Date  . BULIMIA many decades of this    patient keeps alive by drinking ensure that she cannot purge  . Major depressive disorder, recurrent episode, severe     well treated with SSRI's  . KIDNEY DISEASE, CHRONIC, STAGE III 2010    Cr 1.3 -> .84, probably from NSAID's and lasix  . LOW BACK PAIN, CHRONIC     takes tylenol  . Basal cell carcinoma of face   . LEAKAGE, CONTINUOUS URINE 07/30/2009  . Macular degeneration disease     Dingledein, now Appenzeller  . OSTEOPOROSIS      severe, treated with infusions, followed by endocrine in [facility]    Past Surgical History  Procedure Laterality Date  . 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    Review of Systems:   Patient denies headache, fevers, malaise, unintentional weight loss, skin rash, eye pain, sinus congestion and sinus pain, sore throat, dysphagia,  hemoptysis , cough, dyspnea, wheezing, chest pain, palpitations, orthopnea, edema, abdominal pain, nausea, melena, diarrhea, constipation, flank pain, dysuria, hematuria, urinary  Frequency, nocturia, numbness, tingling, seizures,  Focal weakness, Loss of consciousness,  Tremor, insomnia, depression, anxiety, and suicidal ideation.     History   Social History  . Marital Status: Single    Spouse Name: N/A    Number of Children: N/A  . Years of Education: N/A   Occupational History  . Not on file.   Social History Main Topics  . Smoking status: Former Smoker    Quit date: 10/02/2010  . Smokeless tobacco: Never Used  . 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 Drug Use: No  . Sexual Activity: Not on file   Other Topics Concern  . Not on file   Social History Narrative  Patient has been living alone. She is on disability. She goes to church and has support there. She has a very supportive cousin, Calliope Delangel: [phone].    Objective:  Filed Vitals:   03/16/14 1405  BP: 104/60  Pulse: 57  Temp: 97.9 F (36.6 C)  Resp: 14     General appearance: alert, cooperative and appears stated age Ears: normal TM's and external ear canals both ears Throat: lips, mucosa, and tongue normal; teeth and gums normal Neck: no adenopathy, no carotid bruit, supple, symmetrical, trachea midline and thyroid not enlarged, symmetric, no  tenderness/mass/nodules Back: symmetric, no curvature. ROM normal. No CVA tenderness. Lungs: clear to auscultation bilaterally Heart: regular rate and rhythm, S1, S2 normal, no murmur, click, rub or gallop Abdomen: soft, non-tender; bowel sounds normal; no masses,  no organomegaly Pulses: 2+ and symmetric Skin: Skin color, texture, turgor normal. No rashes or lesions Lymph nodes: Cervical, supraclavicular, and axillary nodes normal.  Assessment and Plan:  Insomnia Improved with resuming trazodone  Neck pain Managed previously with NSAIDs which caused CKD which resolv d with suspension of all NSAIDs.   Prescribing vicodin . Plain films ordered.     Polyarthritis Adding vicodin for diffuse pain,  nsaids are contraindicated due to history of gastric ulcers.  Other and unspecified hyperlipidemia Well controlled on current statin therapy.   Liver enzymes are slightly elevated and will be repeated TODAY   Lab Results  Component Value Date   CHOL 141 11/09/2013   HDL 68.20 11/09/2013   LDLCALC 65 11/09/2013   TRIG 40.0 11/09/2013   CHOLHDL 2 11/09/2013     Other nonspecific abnormal serum enzyme levels Unclear if this is due to pravastatin .  repeating today .  Lab Results  Component Value Date   ALT 38* 03/16/2014   AST 55* 03/16/2014   ALKPHOS 80 03/16/2014   BILITOT 0.9 03/16/2014      Updated Medication List Outpatient Encounter Prescriptions as of 03/16/2014  Medication Sig  . aspirin 81 MG tablet Take 1 tablet (81 mg total) by mouth daily.  . calcium citrate-vitamin D 200-200 MG-UNIT TABS Take 3 tablets by mouth daily.   Marland Kitchen docusate sodium (COLACE) 100 MG capsule Take 100 mg by mouth 2 (two) times daily.  Marland Kitchen donepezil (ARICEPT) 10 MG tablet Take 1 tablet (10 mg total) by mouth at bedtime. TAKE ONE TABLET BY MOUTH AT BEDTIME AS NEEDED  . fish oil-omega-3 fatty acids 1000 MG capsule Take 1 capsule by mouth daily.  Marland Kitchen FLUoxetine (PROZAC) 40 MG capsule TAKE ONE CAPSULE BY MOUTH  EVERY DAY in summer  . FLUoxetine HCl 60 MG TABS Take 1 tablet by mouth daily. In winter  . HYDROcodone-acetaminophen (NORCO) 10-325 MG per tablet Take 1 tablet by mouth every 6 (six) hours as needed.  . Multiple Vitamins-Minerals (EYE VITAMINS PO) Take by mouth.  . Multiple Vitamins-Minerals (ICAPS PO) Take by mouth.  Marland Kitchen omeprazole (PRILOSEC) 40 MG capsule TAKE ONE CAPSULE BY MOUTH ONCE DAILY  . oxybutynin (DITROPAN XL) 15 MG 24 hr tablet TAKE ONE TABLET BY MOUTH ONCE DAILY  . polyethylene glycol powder (GLYCOLAX/MIRALAX) powder Take 17 g by mouth daily.  . pravastatin (PRAVACHOL) 20 MG tablet TAKE ONE TABLET BY MOUTH ONCE DAILY  . sulfamethoxazole-trimethoprim (SEPTRA DS) 800-160 MG per tablet Take 1 tablet by mouth 2 (two) times daily.  . traMADol (ULTRAM) 50 MG tablet Take 1-2 tablets (50-100 mg total) by mouth every 6 (six) hours as needed.  . traZODone (DESYREL) 50  MG tablet TAKE ONE OR TWO  TABLETS BY MOUTH ONCE DAILY  . trimethoprim-polymyxin b (POLYTRIM) ophthalmic solution Place 1 drop into the right eye every 4 (four) hours.  . Vitamin D, Ergocalciferol, (DRISDOL) 50000 UNITS CAPS Take 50,000 Units by mouth every 7 (seven) days.   . vitamin E 400 UNIT capsule Take 400 Units by mouth daily.  . zoledronic acid (RECLAST) 5 MG/100ML SOLN Every year.  . [DISCONTINUED] HYDROcodone-acetaminophen (NORCO) 10-325 MG per tablet Take 1 tablet by mouth every 6 (six) hours as needed for pain.  Marland Kitchen tiZANidine (ZANAFLEX) 2 MG tablet Take 1 tablet (2 mg total) by mouth every 8 (eight) hours as needed for muscle spasms.  Marland Kitchen zoster vaccine live, PF, (ZOSTAVAX) 79390 UNT/0.65ML injection Inject 19,400 Units into the skin once.     Orders Placed This Encounter  Procedures  . DG Cervical Spine Complete  . Pneumococcal conjugate vaccine 13-valent  . Comp Met (CMET)  . Ambulatory referral to Ophthalmology  . Ambulatory referral to Ophthalmology    Return in about 3 months (around 06/16/2014).

## 2014-03-16 NOTE — Telephone Encounter (Signed)
The patient has been approved by [facility] to see Dr.Zachary Potter for (331.83) Memory loss  [facility] approval number - [id number] Valid (aug 1st through Oct 29, 2014)

## 2014-03-18 DIAGNOSIS — R748 Abnormal levels of other serum enzymes: Secondary | ICD-10-CM | POA: Insufficient documentation

## 2014-03-18 NOTE — Assessment & Plan Note (Signed)
Unclear if this is due to pravastatin .  repeating today .  Lab Results  Component Value Date   ALT 38* 03/16/2014   AST 55* 03/16/2014   ALKPHOS 80 03/16/2014   BILITOT 0.9 03/16/2014

## 2014-03-18 NOTE — Assessment & Plan Note (Signed)
Managed previously with NSAIDs which caused CKD which resolv d with suspension of all NSAIDs.   Prescribing vicodin .

## 2014-03-18 NOTE — Assessment & Plan Note (Signed)
Improved with resuming trazodone

## 2014-03-18 NOTE — Assessment & Plan Note (Signed)
Well controlled on current statin therapy.   Liver enzymes are slightly elevated and will be repeated TODAY   Lab Results  Component Value Date   CHOL 141 11/09/2013   HDL 68.20 11/09/2013   LDLCALC 65 11/09/2013   TRIG 40.0 11/09/2013   CHOLHDL 2 11/09/2013

## 2014-03-18 NOTE — Assessment & Plan Note (Signed)
Adding vicodin for diffuse pain,  nsaids are contraindicated due to history of gastric ulcers.

## 2014-04-04 ENCOUNTER — Telehealth: Payer: Self-pay | Admitting: Internal Medicine

## 2014-04-04 NOTE — Telephone Encounter (Signed)
Patient came into office to enquire of X ray results please advise.

## 2014-04-04 NOTE — Telephone Encounter (Signed)
I have not seen the report.  Please rrquest

## 2014-04-06 NOTE — Telephone Encounter (Signed)
[facility] stated patient has no recent films there called patient to verify where Films were completed.

## 2014-04-06 NOTE — Telephone Encounter (Signed)
Left message for patient to call office.  

## 2014-04-09 ENCOUNTER — Encounter: Payer: Self-pay | Admitting: Internal Medicine

## 2014-04-10 NOTE — Telephone Encounter (Signed)
Left message for patient to return call to office. 

## 2014-04-10 NOTE — Telephone Encounter (Signed)
Left message for patient to call office to find out where X-rays were done.

## 2014-04-11 NOTE — Telephone Encounter (Signed)
The x rays of her cervical spine appeared in the chart today.  The report is normal.. No fractures , degenerative changes or alignment issues.

## 2014-04-11 NOTE — Telephone Encounter (Signed)
Patient notified as requested. 

## 2014-04-11 NOTE — Telephone Encounter (Signed)
Finally able to reach patient found patient had images done at [place], called [facility] and they are to fax over report today stated they had down order from Dr. Gilford Rile. FYI

## 2014-04-18 LAB — HM DEXA SCAN

## 2014-04-23 ENCOUNTER — Telehealth: Payer: Self-pay | Admitting: Internal Medicine

## 2014-04-23 DIAGNOSIS — M8580 Other specified disorders of bone density and structure, unspecified site: Secondary | ICD-10-CM | POA: Insufficient documentation

## 2014-05-07 ENCOUNTER — Telehealth: Payer: Self-pay | Admitting: Internal Medicine

## 2014-05-07 ENCOUNTER — Other Ambulatory Visit: Payer: Self-pay | Admitting: Internal Medicine

## 2014-05-07 NOTE — Telephone Encounter (Signed)
Pt need refill on Fluoxetine 40 mg and trazodone 50mg . Pt is going out of town on Wednesday and wanted to get rx filled before leaving. Please advise pt

## 2014-05-07 NOTE — Telephone Encounter (Signed)
Patient notified scripts have been sent as requested.

## 2014-06-18 ENCOUNTER — Other Ambulatory Visit: Payer: Self-pay | Admitting: Internal Medicine

## 2014-06-22 ENCOUNTER — Ambulatory Visit (INDEPENDENT_AMBULATORY_CARE_PROVIDER_SITE_OTHER): Payer: Medicare HMO | Admitting: Internal Medicine

## 2014-06-22 ENCOUNTER — Encounter: Payer: Self-pay | Admitting: Internal Medicine

## 2014-06-22 VITALS — BP 103/60 | HR 59 | Temp 97.8°F | Resp 16 | Ht 61.0 in | Wt 131.8 lb

## 2014-06-22 DIAGNOSIS — Z23 Encounter for immunization: Secondary | ICD-10-CM

## 2014-06-22 DIAGNOSIS — M545 Low back pain, unspecified: Secondary | ICD-10-CM

## 2014-06-22 DIAGNOSIS — M899 Disorder of bone, unspecified: Secondary | ICD-10-CM

## 2014-06-22 DIAGNOSIS — M8580 Other specified disorders of bone density and structure, unspecified site: Secondary | ICD-10-CM

## 2014-06-22 DIAGNOSIS — K7689 Other specified diseases of liver: Secondary | ICD-10-CM

## 2014-06-22 DIAGNOSIS — K760 Fatty (change of) liver, not elsewhere classified: Secondary | ICD-10-CM

## 2014-06-22 DIAGNOSIS — M949 Disorder of cartilage, unspecified: Secondary | ICD-10-CM

## 2014-06-22 DIAGNOSIS — M130 Polyarthritis, unspecified: Secondary | ICD-10-CM
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
## 2014-06-22 NOTE — Progress Notes (Signed)
Pre-visit discussion using our clinic review tool. No additional management support is needed unless otherwise documented below in the visit note.  

## 2014-06-22 NOTE — Patient Instructions (Addendum)
You can continue to use vicodin up to 3 daily and it will not harm you liver  We are starting the hepatitis vaccines series today  To protect you from Hep A and Hepatitis  B   Your neck pain may respond to SalonPas topical patches .  You can get them at [facility] received the hepatitis A and B vaccines today.  You need two more doses  One in one month, and then late March

## 2014-06-24 ENCOUNTER — Encounter: Payer: Self-pay | Admitting: Internal Medicine

## 2014-06-24 NOTE — Assessment & Plan Note (Signed)
She is asymptomatic.  Discussed the maxium amount of tylenol she can use safely and she is well under the maximal dose.

## 2014-06-24 NOTE — Assessment & Plan Note (Signed)
Managed with vicodin two daily,  Avoiding NSAIDs due to gastric ulcer and CKD.

## 2014-06-24 NOTE — Assessment & Plan Note (Signed)
Continue trazodone AND PROZAC.

## 2014-06-24 NOTE — Assessment & Plan Note (Signed)
Managed with Reclast by Dr. Andy Gauss

## 2014-07-24 ENCOUNTER — Ambulatory Visit (INDEPENDENT_AMBULATORY_CARE_PROVIDER_SITE_OTHER): Payer: Medicare HMO | Admitting: *Deleted

## 2014-07-24 DIAGNOSIS — Z23 Encounter for immunization: Secondary | ICD-10-CM

## 2014-07-24 LAB — HM MAMMOGRAPHY: HM Mammogram: NEGATIVE

## 2014-07-25 ENCOUNTER — Ambulatory Visit: Payer: Medicare HMO

## 2014-07-26 ENCOUNTER — Encounter: Payer: Self-pay | Admitting: *Deleted

## 2014-08-03 ENCOUNTER — Other Ambulatory Visit: Payer: Self-pay | Admitting: Internal Medicine

## 2014-08-06 ENCOUNTER — Other Ambulatory Visit: Payer: Self-pay | Admitting: Internal Medicine

## 2014-08-09 ENCOUNTER — Other Ambulatory Visit: Payer: Self-pay | Admitting: Internal Medicine

## 2014-08-09 ENCOUNTER — Telehealth: Payer: Self-pay | Admitting: Internal Medicine

## 2014-08-09 NOTE — Telephone Encounter (Signed)
Ms. Lavin stopped by with paperwork from [facility]. Her Hepatitis shots aren't being covered. She said she was told the shots aren't considered preventative care. She would like Dr. Derrel Nip to send [facility] information saying it is preventative care for her fatty liver. I've placed the paperwork in Dr. Lupita Dawn box. Please contact the pt. Pt ph# [phone] Thank you.

## 2014-08-10 ENCOUNTER — Encounter: Payer: Self-pay | Admitting: Internal Medicine

## 2014-08-21 ENCOUNTER — Ambulatory Visit (INDEPENDENT_AMBULATORY_CARE_PROVIDER_SITE_OTHER): Payer: Commercial Managed Care - HMO | Admitting: Nurse Practitioner

## 2014-08-21 ENCOUNTER — Encounter: Payer: Self-pay | Admitting: Nurse Practitioner

## 2014-08-21 VITALS — BP 104/60 | HR 71 | Temp 98.1°F | Resp 16 | Wt 128.0 lb

## 2014-08-21 DIAGNOSIS — M130 Polyarthritis, unspecified: Secondary | ICD-10-CM

## 2014-08-21 DIAGNOSIS — F331 Major depressive disorder, recurrent, moderate: Secondary | ICD-10-CM | POA: Diagnosis not present

## 2014-08-21 MED ORDER — HYDROCODONE-ACETAMINOPHEN 10-325 MG PO TABS: 1.0000 | ORAL_TABLET | Freq: Two times a day (BID) | ORAL | Status: DC

## 2014-08-21 MED ORDER — HYDROCODONE-ACETAMINOPHEN 10-325 MG PO TABS
1.0000 | ORAL_TABLET | Freq: Three times a day (TID) | ORAL | Status: DC | PRN
Start: 2014-08-21 — End: 2014-08-21

## 2014-08-21 MED ORDER — FLUOXETINE HCL 60 MG PO TABS: 1.0000 | ORAL_TABLET | Freq: Every day | ORAL | Status: DC

## 2014-08-21 NOTE — Assessment & Plan Note (Signed)
Stable. Starting on Winter dose of Prozac 60 mg. Will stop the 40 mg of Prozac and start the new dose. Continue with Prozac and Trazadone. Will follow.

## 2014-08-21 NOTE — Progress Notes (Signed)
Pre visit review using our clinic review tool, if applicable. No additional management support is needed unless otherwise documented below in the visit note. 

## 2014-08-21 NOTE — Patient Instructions (Addendum)
Take the 60 mg of Prozac during the winter months only.  Stop the 40 mg of Prozac.  If you have any worsening symptoms please call us.  Happy Holidays.

## 2014-08-21 NOTE — Assessment & Plan Note (Signed)
Stable. Norco is helpful and was refilled for 30 days of use 3 x with each prescription stating when it can be refilled each  Month.

## 2014-08-21 NOTE — Progress Notes (Signed)
Subjective:    Patient ID: Meagan Zavala, female    DOB: 1946-01-08, 68 y.o.   MRN: [id number]  HPI  Meagan Zavala is 68 yo female here for anxiety medication.   1) Anxiety- Switching to 60 mg Prozac because of winter months. 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 Prozac 40 mg is her current dose last night. Denies suicidal ideations.   2) Arthritis- Worse in back, hips, knees.  Uses Norco. Last dose taken 08/19/2014. Pt states it is helpful. She does exercise 3 x week at a class at the [employer].   Review of Systems  Constitutional: Negative for fever, chills, diaphoresis and fatigue.  Musculoskeletal: Positive for myalgias and arthralgias.  Psychiatric/Behavioral: Negative for suicidal ideas. The patient is nervous/anxious.    Past Medical History  Diagnosis Date  . BULIMIA many decades of this    patient keeps alive by drinking ensure that she cannot purge  . Major depressive disorder, recurrent episode, severe     well treated with SSRI's  . KIDNEY DISEASE, CHRONIC, STAGE III 2010    Cr 1.3 -> .84, probably from NSAID's and lasix  . LOW BACK PAIN, CHRONIC     takes tylenol  . Basal cell carcinoma of face   . LEAKAGE, CONTINUOUS URINE 07/30/2009  . Macular degeneration disease     Dingledein, now Appenzeller  . OSTEOPOROSIS     severe, treated with infusions, followed by endocrine in [place]    History   Social History  . Marital Status: Single    Spouse Name: N/A    Number of Children: N/A  . Years of Education: N/A   Occupational History  . Not on file.   Social History Main Topics  . Smoking status: Former Smoker    Quit date: 10/02/2010  . Smokeless tobacco: Never Used  . 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 Drug Use: No  . Sexual Activity: Not on file   Other Topics Concern  . Not on file   Social History Narrative   Patient has been living alone. She is on disability. She goes to church and has  support there. She has a very supportive cousin, Jamyiah Labella: [phone].    Past Surgical History  Procedure Laterality Date  . 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 Cancer Mother     breast,   . Cancer Father     lung  . Cancer Sister     lung, brain  . Cancer Brother     throat    No Known Allergies  Current Outpatient Prescriptions on File Prior to Visit  Medication Sig Dispense Refill  . aspirin 81 MG tablet Take 1 tablet (81 mg total) by mouth daily. 30 tablet 11  . calcium citrate-vitamin D 200-200 MG-UNIT TABS Take 3 tablets by mouth daily.     Marland Kitchen docusate sodium (COLACE) 100 MG capsule Take 100 mg by mouth 2 (two) times daily.    Marland Kitchen donepezil (ARICEPT) 10 MG tablet Take 1 tablet (10 mg total) by mouth at bedtime. TAKE ONE TABLET BY MOUTH AT BEDTIME AS NEEDED 901 tablet 1  . fish oil-omega-3 fatty acids 1000 MG capsule Take 1 capsule by mouth daily.    . Multiple Vitamins-Minerals (ICAPS PO) Take by mouth.    Marland Kitchen omeprazole (PRILOSEC) 40  MG capsule TAKE ONE CAPSULE BY MOUTH ONCE DAILY 90 capsule 0  . oxybutynin (DITROPAN XL) 15 MG 24 hr tablet TAKE ONE TABLET BY MOUTH ONCE DAILY 30 tablet 0  . pravastatin (PRAVACHOL) 20 MG tablet TAKE ONE TABLET BY MOUTH ONCE DAILY 90 tablet 0  . traMADol (ULTRAM) 50 MG tablet Take 1-2 tablets (50-100 mg total) by mouth every 6 (six) hours as needed. 180 tablet 3  . traZODone (DESYREL) 50 MG tablet TAKE ONE TO TWO TABLETS BY MOUTH ONCE DAILY 180 tablet 0  . Vitamin D, Ergocalciferol, (DRISDOL) 50000 UNITS CAPS Take 50,000 Units by mouth every 7 (seven) days.     . vitamin E 400 UNIT capsule Take 400 Units by mouth daily.    . zoledronic acid (RECLAST) 5 MG/100ML SOLN Every year.    . zoster vaccine live, PF, (ZOSTAVAX) 40814 UNT/0.65ML injection Inject 19,400 Units into the skin once. 1 each 0   No current facility-administered medications on file prior to visit.     BP 104/60 mmHg  Pulse 71  Temp(Src) 98.1 F (36.7 C) (Oral)  Resp 16  Wt 128 lb (58.06 kg)  SpO2 97%        Objective:   Physical Exam  Constitutional: She is oriented to person, place, and time. She appears well-developed and well-nourished. No distress.  Cardiovascular: Normal rate and regular rhythm.   Pulmonary/Chest: Effort normal and breath sounds normal. She has no wheezes.  Neurological: She is alert and oriented to person, place, and time.  Skin: Skin is warm and dry. No rash noted. She is not diaphoretic.  Psychiatric: She has a normal mood and affect. Her behavior is normal. Judgment and thought content normal.       Assessment & Plan:

## 2014-08-28 ENCOUNTER — Ambulatory Visit: Payer: Medicare HMO

## 2014-09-13 ENCOUNTER — Other Ambulatory Visit: Payer: Self-pay | Admitting: *Deleted

## 2014-09-13 MED ORDER — OXYBUTYNIN CHLORIDE ER 15 MG PO TB24: 15.0000 mg | ORAL_TABLET | Freq: Every day | ORAL | Status: DC

## 2014-09-13 NOTE — Telephone Encounter (Signed)
Pt left VM, requesting 90 day refills for her Rxs, especially the Oxybutynin she is needing now. Rx sent to pharmacy by escript

## 2014-09-17 ENCOUNTER — Other Ambulatory Visit: Payer: Self-pay | Admitting: Internal Medicine

## 2014-10-29 ENCOUNTER — Other Ambulatory Visit: Payer: Self-pay | Admitting: Internal Medicine

## 2014-12-21 ENCOUNTER — Ambulatory Visit: Payer: Medicare HMO | Admitting: Internal Medicine

## 2014-12-24 ENCOUNTER — Ambulatory Visit (INDEPENDENT_AMBULATORY_CARE_PROVIDER_SITE_OTHER): Payer: PPO | Admitting: Internal Medicine

## 2014-12-24 ENCOUNTER — Encounter: Payer: Self-pay | Admitting: Internal Medicine

## 2014-12-24 VITALS — BP 106/60 | HR 68 | Temp 98.5°F | Resp 14 | Ht 61.0 in | Wt 120.0 lb

## 2014-12-24 DIAGNOSIS — R300 Dysuria: Secondary | ICD-10-CM | POA: Diagnosis not present

## 2014-12-24 DIAGNOSIS — N3945 Continuous leakage: Secondary | ICD-10-CM | POA: Diagnosis not present

## 2014-12-24 DIAGNOSIS — M545 Low back pain: Secondary | ICD-10-CM

## 2014-12-24 LAB — POCT URINALYSIS DIPSTICK
BILIRUBIN UA: NEGATIVE
Blood, UA: NEGATIVE
Glucose, UA: NEGATIVE
Ketones, UA: 15
NITRITE UA: NEGATIVE
PH UA: 6
PROTEIN UA: 30
Spec Grav, UA: >=1.03
Urobilinogen, UA: 0.2

## 2014-12-24 MED ORDER — OXYBUTYNIN CHLORIDE 10 % TD GEL: TRANSDERMAL | Status: DC

## 2014-12-24 MED ORDER — PHENAZOPYRIDINE HCL 200 MG PO TABS: 200.0000 mg | ORAL_TABLET | Freq: Three times a day (TID) | ORAL | Status: DC | PRN

## 2014-12-24 MED ORDER — MELOXICAM 7.5 MG PO TABS: 7.5000 mg | ORAL_TABLET | Freq: Every day | ORAL | Status: DC

## 2014-12-24 NOTE — Progress Notes (Signed)
Patient ID: Meagan Zavala, female   DOB: [birth year], 69 y.o.   MRN: [id number]  Patient Active Problem List   Diagnosis Date Noted  . Fatty liver disease, nonalcoholic 97/35/3299  . Osteopenia 04/23/2014  . Other and unspecified hyperlipidemia 09/01/2013  . Unspecified vitamin D deficiency 09/01/2013  . Chronic cough 09/29/2012  . Routine general medical examination at a health care facility 09/16/2012  . Polyarthritis 04/16/2012  . Macular degeneration disease   . Neck pain 03/03/2011  . Insomnia 11/25/2010  . MCI (mild cognitive impairment) with memory loss 11/18/2010  . BULIMIA 07/30/2009  . Major depressive disorder, recurrent episode, moderate 07/30/2009  . GERD 07/30/2009  . KIDNEY DISEASE, CHRONIC, STAGE III 07/30/2009  . LOW BACK PAIN, CHRONIC 07/30/2009  . OSTEOPOROSIS 07/30/2009  . Urinary incontinence 07/30/2009    Subjective:  CC:   Chief Complaint  Patient presents with  . Urinary Tract Infection    sen at urgent care last Monday, treated with Macro-Bid    HPI:   Meagan Zavala is a 69 y.o. female who presents for  Persistent dysuria and urinary incontinence. She was treated at Urgent last Monday for 2 week history of urethral pain with Macrobid x 7 days,  Symptoms have marginally improved but still dribbling.  Pain got worse on Saturday night  So took vicodin    Wants something for pain   Joint pain caused by working in the yard.  Tried tramadol.  Has not tried  NSAIDs  Not taking tylenol .  Wants refill on vicodin    Past Medical History  Diagnosis Date  . BULIMIA many decades of this    patient keeps alive by drinking ensure that she cannot purge  . Major depressive disorder, recurrent episode, severe     well treated with SSRI's  . KIDNEY DISEASE, CHRONIC, STAGE III 2010    Cr 1.3 -> .84, probably from NSAID's and lasix  . LOW BACK PAIN, CHRONIC     takes tylenol  . Basal cell carcinoma of face   . LEAKAGE, CONTINUOUS URINE 07/30/2009  . Macular  degeneration disease     Dingledein, now Appenzeller  . OSTEOPOROSIS     severe, treated with infusions, followed by endocrine in [facility]    Past Surgical History  Procedure Laterality Date  . 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    Review of Systems:   Patient denies headache, fevers, malaise, unintentional weight loss, skin rash, eye pain, sinus congestion and sinus pain, sore throat, dysphagia,  hemoptysis , cough, dyspnea, wheezing, chest pain, palpitations, orthopnea, edema, abdominal pain, nausea, melena, diarrhea, constipation, flank pain, dysuria, hematuria, urinary  Frequency, nocturia, numbness, tingling, seizures,  Focal weakness, Loss of consciousness,  Tremor, insomnia, depression, anxiety, and suicidal ideation.     History   Social History  . Marital Status: Single    Spouse Name: N/A  . Number of Children: N/A  . Years of Education: N/A   Occupational History  . Not on file.   Social History Main Topics  . Smoking status: Former Smoker    Quit date: 10/02/2010  . Smokeless tobacco: Never Used  . 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 Drug Use: No  . Sexual Activity: Not  on file   Other Topics Concern  . Not on file   Social History Narrative   Patient has been living alone. She is on disability. She goes to church and has support there. She has a very supportive Zavala, Nykayla Marcelli: [phone].    Objective:  Filed Vitals:   12/24/14 1547  BP: 106/60  Pulse: 68  Temp: 98.5 F (36.9 C)  Resp: 14     General appearance: alert, cooperative and appears stated age Ears: normal TM's and external ear canals both ears Throat: lips, mucosa, and tongue normal; teeth and gums normal Neck: no adenopathy, no carotid bruit, supple, symmetrical,  trachea midline and thyroid not enlarged, symmetric, no tenderness/mass/nodules Back: symmetric, no curvature. ROM normal. No CVA tenderness. Lungs: clear to auscultation bilaterally Heart: regular rate and rhythm, S1, S2 normal, no murmur, click, rub or gallop Abdomen: soft, non-tender; bowel sounds normal; no masses,  no organomegaly Pulses: 2+ and symmetric Skin: Skin color, texture, turgor normal. No rashes or lesions Lymph nodes: Cervical, supraclavicular, and axillary nodes normal.  Assessment and Plan:  Urinary incontinence There is no evidence of recurrent UTI by culture results. Will prescribe pyridium for urethral pain .  If she is still having symptoms she should make appt for pelvic exam    LOW BACK PAIN, CHRONIC adised to try ES tylenol and meloxicam  Lab Results  Component Value Date   CREATININE 0.9 03/16/2014       Updated Medication List Outpatient Encounter Prescriptions as of 12/24/2014  Medication Sig  . aspirin 81 MG tablet Take 1 tablet (81 mg total) by mouth daily.  . calcium citrate-vitamin D 200-200 MG-UNIT TABS Take 3 tablets by mouth daily.   . clonazePAM (KLONOPIN) 0.5 MG tablet 0.25 mg 2 (two) times daily as needed.   . docusate sodium (COLACE) 100 MG capsule Take 100 mg by mouth 2 (two) times daily.  Marland Kitchen donepezil (ARICEPT) 10 MG tablet Take 1 tablet (10 mg total) by mouth at bedtime. TAKE ONE TABLET BY MOUTH AT BEDTIME AS NEEDED  . fish oil-omega-3 fatty acids 1000 MG capsule Take 1 capsule by mouth daily.  Marland Kitchen FLUoxetine HCl 60 MG TABS Take 1 tablet by mouth daily. In winter  . HYDROcodone-acetaminophen (NORCO) 10-325 MG per tablet Take 1 tablet by mouth 2 (two) times daily.  Marland Kitchen HYDROcodone-acetaminophen (NORCO) 10-325 MG per tablet Take 1 tablet by mouth 2 (two) times daily. May refill on or after 09/20/2014.  Marland Kitchen HYDROcodone-acetaminophen (NORCO) 10-325 MG per tablet Take 1 tablet by mouth 2 (two) times daily. May refill on or after 10/21/2013  .  Multiple Vitamins-Minerals (ICAPS PO) Take by mouth.  . nitrofurantoin, macrocrystal-monohydrate, (MACROBID) 100 MG capsule   . omeprazole (PRILOSEC) 40 MG capsule TAKE ONE CAPSULE BY MOUTH ONCE DAILY  . pravastatin (PRAVACHOL) 20 MG tablet TAKE ONE TABLET BY MOUTH ONCE DAILY  . traZODone (DESYREL) 50 MG tablet TAKE ONE TO TWO TABLETS BY MOUTH ONCE DAILY  . Vitamin D, Ergocalciferol, (DRISDOL) 50000 UNITS CAPS Take 50,000 Units by mouth every 7 (seven) days.   . vitamin E 400 UNIT capsule Take 400 Units by mouth daily.  . zoledronic acid (RECLAST) 5 MG/100ML SOLN Every year.  . zoster vaccine live, PF, (ZOSTAVAX) 55732 UNT/0.65ML injection Inject 19,400 Units into the skin once.  . meloxicam (MOBIC) 7.5 MG tablet Take 1 tablet (7.5 mg total) by mouth daily.  . Oxybutynin Chloride 10 % GEL Apply contents of packet onto skin once daily  .  phenazopyridine (PYRIDIUM) 200 MG tablet Take 1 tablet (200 mg total) by mouth 3 (three) times daily as needed for pain.  . traMADol (ULTRAM) 50 MG tablet Take 1-2 tablets (50-100 mg total) by mouth every 6 (six) hours as needed. (Patient not taking: Reported on 12/24/2014)  . [DISCONTINUED] oxybutynin (DITROPAN XL) 15 MG 24 hr tablet Take 1 tablet (15 mg total) by mouth daily. (Patient not taking: Reported on 12/24/2014)     Orders Placed This Encounter  Procedures  . Urine Culture  . Urinalysis, Routine w reflex microscopic  . POCT Urinalysis Dipstick    No Follow-up on file.

## 2014-12-24 NOTE — Patient Instructions (Addendum)
Please make another appt to  discuss your recent [facility] Screening   I would like you to try ES tylenol up to 3 times daily ,  And once daily meloxicam for your pain.    i do not prescribe  chronic narcotics for arthritis pain unless everything else has breen tried  You can use the pyridium  For your urethral pain .  Your UA was normal.  I am checking your urine culture which may take several days

## 2014-12-24 NOTE — Progress Notes (Signed)
Pre-visit discussion using our clinic review tool. No additional management support is needed unless otherwise documented below in the visit note.  

## 2014-12-25 LAB — URINALYSIS, ROUTINE W REFLEX MICROSCOPIC
BILIRUBIN URINE: NEGATIVE
Hgb urine dipstick: NEGATIVE
Leukocytes, UA: NEGATIVE
NITRITE: NEGATIVE
PH: 6 (ref 5.0–8.0)
URINE GLUCOSE: NEGATIVE
UROBILINOGEN UA: 0.2 (ref 0.0–1.0)

## 2014-12-25 LAB — URINE CULTURE
COLONY COUNT: NO GROWTH
Organism ID, Bacteria: NO GROWTH

## 2014-12-26 ENCOUNTER — Encounter: Payer: Self-pay | Admitting: Internal Medicine

## 2014-12-26 NOTE — Assessment & Plan Note (Addendum)
There is no evidence of recurrent UTI by culture results. Will prescribe pyridium for urethral pain .  If she is still having symptoms she should make appt for pelvic exam

## 2014-12-26 NOTE — Assessment & Plan Note (Signed)
adised to try ES tylenol and meloxicam  Lab Results  Component Value Date   CREATININE 0.9 03/16/2014

## 2015-01-02 ENCOUNTER — Other Ambulatory Visit: Payer: Self-pay | Admitting: Internal Medicine

## 2015-01-02 ENCOUNTER — Telehealth: Payer: Self-pay | Admitting: Internal Medicine

## 2015-01-02 ENCOUNTER — Other Ambulatory Visit: Payer: Self-pay | Admitting: *Deleted

## 2015-01-02 MED ORDER — FLUOXETINE HCL 40 MG PO CAPS: 40.0000 mg | ORAL_CAPSULE | Freq: Every day | ORAL | Status: DC

## 2015-01-02 MED ORDER — FLUOXETINE HCL 60 MG PO TABS: 1.0000 | ORAL_TABLET | Freq: Every day | ORAL | Status: DC

## 2015-01-02 NOTE — Telephone Encounter (Signed)
Patient stated dose is 60 mg in winter and 40 mg in spring. Takes medication year round just lower dose in spring.

## 2015-01-02 NOTE — Telephone Encounter (Signed)
Ask Meagan Zavala if she would like to start tapering off the prozac  That Spring  Is here.  If so,  Reduce dose to 1/2 tablet daily  For two weeks.   Thrn tskr 1/2 tablet every other day for two weeks,  Then stop .  If she starts feeling bad during the taper and wants to resume , just call

## 2015-01-02 NOTE — Telephone Encounter (Signed)
40 mg dose sent to pharmacy

## 2015-01-07 ENCOUNTER — Ambulatory Visit: Payer: PPO | Admitting: Internal Medicine

## 2015-01-20 NOTE — Op Note (Signed)
PATIENT NAME:  Meagan Zavala, Meagan Zavala MR#:  [id number] DATE OF BIRTH:  [birth year]  DATE OF PROCEDURE:  11/30/2011  PREOPERATIVE DIAGNOSIS: Cataract, right eye.   POSTOPERATIVE DIAGNOSIS: Cataract, right eye.   PROCEDURE PERFORMED: Extracapsular cataract extraction using phacoemulsification with placement of an Alcon SN6CWS, 20.5-diopter posterior chamber lens, serial # [id number].   SURGEON: Loura Back. Ilhan Debenedetto, M.D.   ASSISTANT: None.   ANESTHESIA: 4% lidocaine and 0.75% Marcaine in a 50-50 mixture with 10 units/mL of Hylenex added, given as a peribulbar.   ANESTHESIOLOGIST: Dr. Benjamine Mola   COMPLICATIONS: None.   ESTIMATED BLOOD LOSS: Less than 1 mL.   DESCRIPTION OF PROCEDURE:  The patient was brought to the operating room and given a peribulbar block.  The patient was then prepped and draped in the usual fashion.  The vertical rectus muscles were imbricated using 5-0 silk sutures.  These sutures were then clamped to the sterile drapes as bridle sutures.  A limbal peritomy was performed extending two clock hours and hemostasis was obtained with cautery.  A partial thickness scleral groove was made at the surgical limbus and dissected anteriorly in a lamellar dissection using an Alcon crescent knife.  The anterior chamber was entered superonasally with a Superblade and through the lamellar dissection with a 2.6 mm keratome.  DisCoVisc was used to replace the aqueous and a continuous tear capsulorrhexis was carried out.  Hydrodissection and hydrodelineation were carried out with balanced salt and a 27 gauge canula.  The nucleus was rotated to confirm the effectiveness of the hydrodissection.  Phacoemulsification was carried out using a divide-and-conquer technique.  Total ultrasound time was 1 minute and 11 seconds with an average power of 15.8 percent. CDE 19.39.  Irrigation/aspiration was used to remove the residual cortex.  DisCoVisc was used to inflate the capsule and the internal incision was  enlarged to 3 mm with the crescent knife.  The intraocular lens was folded and inserted into the capsular bag using the Acrysert delivery system.  Irrigation/aspiration was used to remove the residual DisCoVisc.  Miostat was injected into the anterior chamber through the paracentesis track to inflate the anterior chamber and induce miosis.  The wound was checked for leaks and none were found. The conjunctiva was closed with cautery and the bridle sutures were removed.  Two drops of 0.3% Vigamox were placed on the eye.   An eye shield was placed on the eye.  The patient was discharged to the recovery room in good condition.   ____________________________ Loura Back Kaysey Berndt, MD sad:bjt D: 11/30/2011 13:11:51 ET T: 11/30/2011 13:51:50 ET JOB#: [id number]  cc: Remo Lipps A. Ryland Smoots, MD, <Dictator> Martie Lee MD ELECTRONICALLY SIGNED 12/01/2011 14:19

## 2015-01-20 NOTE — Op Note (Signed)
PATIENT NAME:  Meagan Zavala, TESCH I MR#:  [id number] DATE OF BIRTH:  1946-03-28  DATE OF PROCEDURE:  12/29/2011  PREOPERATIVE DIAGNOSIS:  Cataract, left eye.    POSTOPERATIVE DIAGNOSIS:  Cataract, left eye.  PROCEDURE PERFORMED:  Extracapsular cataract extraction using phacoemulsification with placement of a SN6CWS 22.0-diopter posterior chamber lens, serial #[id number].  SURGEON:  Loura Back. Zell Hylton, MD  ASSISTANT:  None.  ANESTHESIA:  4% lidocaine and 0.75% Marcaine in a 50/50 mixture with 10 units/mL of Hylenex added, given as a peribulbar.   ANESTHESIOLOGIST:  Dr. Kayleen Memos  COMPLICATIONS:  None.  ESTIMATED BLOOD LOSS:  Less than 1 ml.  DESCRIPTION OF PROCEDURE:  The patient was brought to the operating room and given a peribulbar block.  The patient was then prepped and draped in the usual fashion.  The vertical rectus muscles were imbricated using 5-0 silk sutures.  These sutures were then clamped to the sterile drapes as bridle sutures.  A limbal peritomy was performed extending two clock hours and hemostasis was obtained with cautery.  A partial thickness scleral groove was made at the surgical limbus and dissected anteriorly in a lamellar dissection using an Alcon crescent knife.  The anterior chamber was entered supero-temporally with a Superblade and through the lamellar dissection with a 2.6 mm keratome.  DisCoVisc was used to replace the aqueous and a continuous tear capsulorrhexis was carried out.  Hydrodissection and hydrodelineation were carried out with balanced salt and a 27 gauge canula.  The nucleus was rotated to confirm the effectiveness of the hydrodissection.  Phacoemulsification was carried out using a divide-and-conquer technique.  Total ultrasound time was 1 minute and 10 seconds with an average power of 18 percent for CDE 23.55.  Irrigation/aspiration was used to remove the residual cortex.  DisCoVisc was used to inflate the capsule and the internal incision was  enlarged to 3 mm with the crescent knife.  The intraocular lens was folded and inserted into the capsular bag using the AcrySert delivery system.  Irrigation/aspiration was used to remove the residual DisCoVisc.  Miostat was injected into the anterior chamber through the paracentesis track to inflate the anterior chamber and induce miosis.  The wound was checked for leaks and none were found. The conjunctiva was closed with cautery and the bridle sutures were removed.  Two drops of 0.3% Vigamox were placed on the eye.   An eye shield was placed on the eye.  The patient was discharged to the recovery room in good condition.  ____________________________ Loura Back Aydan Levitz, MD sad:slb D: 12/29/2011 13:53:00 ET T: 12/29/2011 15:26:56 ET JOB#: [id number]  cc: Remo Lipps A. Jaysion Ramseyer, MD, <Dictator> Martie Lee MD ELECTRONICALLY SIGNED 01/04/2012 12:15

## 2015-01-23 ENCOUNTER — Encounter: Payer: Self-pay | Admitting: Internal Medicine

## 2015-01-23 ENCOUNTER — Ambulatory Visit (INDEPENDENT_AMBULATORY_CARE_PROVIDER_SITE_OTHER): Payer: PPO | Admitting: Internal Medicine

## 2015-01-23 ENCOUNTER — Ambulatory Visit: Payer: PPO | Admitting: *Deleted

## 2015-01-23 VITALS — BP 104/64 | HR 73 | Temp 97.7°F | Resp 14 | Ht 61.0 in | Wt 122.8 lb

## 2015-01-23 DIAGNOSIS — Z23 Encounter for immunization: Secondary | ICD-10-CM

## 2015-01-23 DIAGNOSIS — E559 Vitamin D deficiency, unspecified: Secondary | ICD-10-CM | POA: Diagnosis not present

## 2015-01-23 DIAGNOSIS — Z0000 Encounter for general adult medical examination without abnormal findings: Secondary | ICD-10-CM

## 2015-01-23 DIAGNOSIS — G8929 Other chronic pain: Secondary | ICD-10-CM

## 2015-01-23 DIAGNOSIS — K760 Fatty (change of) liver, not elsewhere classified: Secondary | ICD-10-CM

## 2015-01-23 DIAGNOSIS — M542 Cervicalgia: Secondary | ICD-10-CM

## 2015-01-23 LAB — COMPREHENSIVE METABOLIC PANEL
ALT: 25 U/L (ref 0–35)
AST: 27 U/L (ref 0–37)
Albumin: 4.1 g/dL (ref 3.5–5.2)
Alkaline Phosphatase: 88 U/L (ref 39–117)
BUN: 14 mg/dL (ref 6–23)
CO2: 29 mEq/L (ref 19–32)
Calcium: 9.4 mg/dL (ref 8.4–10.5)
Chloride: 103 mEq/L (ref 96–112)
Creatinine, Ser: 0.85 mg/dL (ref 0.40–1.20)
GFR: 70.1 mL/min (ref >60.00)
Glucose, Bld: 79 mg/dL (ref 70–99)
Potassium: 3.7 mEq/L (ref 3.5–5.1)
SODIUM: 138 meq/L (ref 135–145)
Total Bilirubin: 0.4 mg/dL (ref 0.2–1.2)
Total Protein: 6 g/dL (ref 6.0–8.3)

## 2015-01-23 MED ORDER — MELOXICAM 7.5 MG PO TABS
7.5000 mg | ORAL_TABLET | Freq: Every day | ORAL | Status: DC
Start: 2015-01-23 — End: 2015-10-17

## 2015-01-23 NOTE — Patient Instructions (Addendum)
Your [facility] Screening tests were fine.  Your risk for having a hear attack is very low  You can take the meloxicam as needed.  Listen to your friends. Your niece and nephew are manipulating you,  You are not doing them any favors by supporting them .  You are ENABLING them .

## 2015-01-23 NOTE — Progress Notes (Signed)
Patient ID: Meagan Zavala, female   DOB: Mar 22, 1946, 69 y.o.   MRN: [id number]  The patient is here for annual Medicare wellness examination and management of other chronic and acute problems.  She wants to  discuss the results of her recent comprehensive screening evaluation by [facility] and follow up on other chronic issues, including fatty liver,  DJD   .  Patient had carotid doppler,  EKG,  Fasting labs,  ABIs,  And all of the results were reviewed with patient.  Reassurance provided that she was at low risk for CAD and CVA despite the comments on the summary page which put her at moderate risk ,    Did not take meloxicam due to fear of CAD which was reported as a risk in the literature that came with the medications.    Has decided to financially support her sister's adult unemployed children because of the two grandchildren who are < 29 years old.  She is now working a second job to do so.   Received hep a and Hepatitis B   final vaccine       The risk factors are reflected in the social history.  The roster of all physicians providing medical care to patient - is listed in the Snapshot section of the chart.  Activities of daily living:  The patient is 100% independent in all ADLs: dressing, toileting, feeding as well as independent mobility  Home safety : The patient has smoke detectors in the home. They wear seatbelts.  There are no firearms at home. There is no violence in the home.   There is no risks for hepatitis, STDs or HIV. There is no   history of blood transfusion. They have no travel history to infectious disease endemic areas of the world.  The patient has seen their dentist in the last six month. They have seen their eye doctor in the last year. They admit to slight hearing difficulty with regard to whispered voices and some television programs.  They have deferred audiologic testing in the last year.  They do not  have excessive sun exposure. Discussed the need for sun  protection: hats, long sleeves and use of sunscreen if there is significant sun exposure.   Diet: the importance of a healthy diet is discussed. She does not have a healthy diet and suffers from chronic bulemia. .  The benefits of regular aerobic exercise were discussed. She does not do any regular exercise due to joint pain. .   Depression screen: there are no signs or vegative symptoms of depression- irritability, change in appetite, anhedonia, sadness/tearfullness.  Cognitive assessment: the patient manages all their financial and personal affairs and is actively engaged. They could relate day,date,year and events; recalled 2/3 objects at 3 minutes; performed clock-face test normally.  The following portions of the patient's history were reviewed and updated as appropriate: allergies, current medications, past family history, past medical history,  past surgical history, past social history  and problem list.  Visual acuity was not assessed per patient preference since she has regular follow up with her ophthalmologist. Hearing and body mass index were assessed and reviewed.   During the course of the visit the patient was educated and counseled about appropriate screening and preventive services including : fall prevention , diabetes screening, nutrition counseling, colorectal cancer screening, and recommended immunizations.    Review of systems:  Patient denies headache, fevers, malaise, unintentional weight loss, skin rash, eye pain, sinus congestion and sinus pain,  sore throat, dysphagia,  hemoptysis , cough, dyspnea, wheezing, chest pain, palpitations, orthopnea, edema, abdominal pain, nausea, melena, diarrhea, constipation, flank pain, dysuria, hematuria, urinary  Frequency, nocturia, numbness, tingling, seizures,  Focal weakness, Loss of consciousness,  Tremor, insomnia, depression, anxiety, and suicidal ideation.    Objective:  BP 104/64 mmHg  Pulse 73  Temp(Src) 97.7 F (36.5 C)  (Oral)  Resp 14  Ht 5\' 1"  (1.549 m)  Wt 122 lb 12 oz (55.679 kg)  BMI 23.21 kg/m2  SpO2 99%  General appearance: alert, cooperative and appears stated age Ears: normal TM's and external ear canals both ears Throat: lips, mucosa, and tongue normal; teeth and gums normal Neck: no adenopathy, no carotid bruit, supple, symmetrical, trachea midline and thyroid not enlarged, symmetric, no tenderness/mass/nodules Back: symmetric, no curvature. ROM normal. No CVA tenderness. Lungs: clear to auscultation bilaterally Heart: regular rate and rhythm, S1, S2 normal, no murmur, click, rub or gallop Abdomen: soft, non-tender; bowel sounds normal; no masses,  no organomegaly Pulses: 2+ and symmetric Skin: Skin color, texture, turgor normal. No rashes or lesions Lymph nodes: Cervical, supraclavicular, and axillary nodes normal.  Assessment and Plan:

## 2015-01-23 NOTE — Progress Notes (Signed)
Pre-visit discussion using our clinic review tool. No additional management support is needed unless otherwise documented below in the visit note.  

## 2015-01-24 ENCOUNTER — Telehealth: Payer: Self-pay | Admitting: *Deleted

## 2015-01-24 ENCOUNTER — Encounter: Payer: Self-pay | Admitting: *Deleted

## 2015-01-24 LAB — VITAMIN D 25 HYDROXY (VIT D DEFICIENCY, FRACTURES): VITD: 84.14 ng/mL (ref 30.00–100.00)

## 2015-01-24 MED ORDER — OXYBUTYNIN CHLORIDE ER 10 MG PO TB24: 10.0000 mg | ORAL_TABLET | Freq: Every day | ORAL | Status: DC

## 2015-01-24 NOTE — Telephone Encounter (Signed)
Pt called states she received a letter from her insurance stating they would not cover Oxybutynin Chloride 10% Gel and it costs over $300 monthly.  Pt is requesting an alternative.  Please advise

## 2015-01-24 NOTE — Telephone Encounter (Signed)
Oral form sent to pharmacy

## 2015-01-26 ENCOUNTER — Encounter: Payer: Self-pay | Admitting: Internal Medicine

## 2015-01-26 NOTE — Assessment & Plan Note (Signed)

## 2015-02-04 ENCOUNTER — Other Ambulatory Visit: Payer: Self-pay | Admitting: Internal Medicine

## 2015-02-05 ENCOUNTER — Telehealth: Payer: Self-pay | Admitting: *Deleted

## 2015-02-05 MED ORDER — SOLIFENACIN SUCCINATE 5 MG PO TABS
5.0000 mg | ORAL_TABLET | Freq: Every day | ORAL | Status: DC
Start: 2015-02-05 — End: 2015-02-06

## 2015-02-05 NOTE — Telephone Encounter (Signed)
Generic vesicare sent

## 2015-02-05 NOTE — Telephone Encounter (Signed)
Left detailed message advising Rx sent to pharmacy

## 2015-02-05 NOTE — Telephone Encounter (Signed)
Pt called states the Gelnique (Oxybutynin Chloride 10% Gel) is not covered by her insurance.  Pt is requesting another medication that she will be able to absorb.  Please advise

## 2015-02-05 NOTE — Telephone Encounter (Signed)
Pt states she was not able to absorb that and the Gelnique was prescribed to her.

## 2015-02-05 NOTE — Telephone Encounter (Signed)
She was given oxybutynin tablet on April 28. Has she had that filled?

## 2015-02-06 MED ORDER — SOLIFENACIN SUCCINATE 5 MG PO TABS: 5.0000 mg | ORAL_TABLET | Freq: Every day | ORAL | Status: DC

## 2015-02-06 NOTE — Addendum Note (Signed)
Addended by: Nanci Pina on: 02/06/2015 01:19 PM   Modules accepted: Orders

## 2015-02-06 NOTE — Telephone Encounter (Signed)
Patient called requesting 54 dfay supply script revised for 90 day supply.

## 2015-02-12 ENCOUNTER — Other Ambulatory Visit: Payer: Self-pay | Admitting: Internal Medicine

## 2015-03-21 ENCOUNTER — Other Ambulatory Visit: Payer: Self-pay | Admitting: Internal Medicine

## 2015-04-24 ENCOUNTER — Encounter: Payer: Self-pay | Admitting: Internal Medicine

## 2015-04-24 ENCOUNTER — Ambulatory Visit (INDEPENDENT_AMBULATORY_CARE_PROVIDER_SITE_OTHER): Payer: PPO | Admitting: Internal Medicine

## 2015-04-24 VITALS — BP 106/58 | HR 61 | Temp 97.8°F | Resp 16 | Ht 61.0 in | Wt 127.0 lb

## 2015-04-24 DIAGNOSIS — N183 Chronic kidney disease, stage 3 unspecified: Secondary | ICD-10-CM

## 2015-04-24 DIAGNOSIS — M810 Age-related osteoporosis without current pathological fracture: Secondary | ICD-10-CM

## 2015-04-24 DIAGNOSIS — G3184 Mild cognitive impairment, so stated: Secondary | ICD-10-CM | POA: Diagnosis not present

## 2015-04-24 DIAGNOSIS — D513 Other dietary vitamin B12 deficiency anemia: Secondary | ICD-10-CM

## 2015-04-24 DIAGNOSIS — M5441 Lumbago with sciatica, right side: Secondary | ICD-10-CM

## 2015-04-24 LAB — IRON AND TIBC
%SAT: 10 % — ABNORMAL LOW (ref 20–55)
Iron: 42 ug/dL (ref 42–145)
TIBC: 405 ug/dL (ref 250–470)
UIBC: 363 ug/dL (ref 125–400)

## 2015-04-24 LAB — CBC WITH DIFFERENTIAL/PLATELET
BASOS ABS: 0 10*3/uL (ref 0.0–0.1)
Basophils Relative: 0.4 % (ref 0.0–3.0)
EOS PCT: 1.9 % (ref 0.0–5.0)
Eosinophils Absolute: 0.1 10*3/uL (ref 0.0–0.7)
HEMATOCRIT: 31.1 % — AB (ref 36.0–46.0)
Hemoglobin: 10.2 g/dL — ABNORMAL LOW (ref 12.0–15.0)
LYMPHS ABS: 1.4 10*3/uL (ref 0.7–4.0)
Lymphocytes Relative: 29.6 % (ref 12.0–46.0)
MCHC: 32.8 g/dL (ref 30.0–36.0)
MCV: 92 fl (ref 78.0–100.0)
MONO ABS: 0.4 10*3/uL (ref 0.1–1.0)
Monocytes Relative: 7.9 % (ref 3.0–12.0)
NEUTROS PCT: 60.2 % (ref 43.0–77.0)
Neutro Abs: 2.8 10*3/uL (ref 1.4–7.7)
PLATELETS: 138 10*3/uL — AB (ref 150.0–400.0)
RBC: 3.38 Mil/uL — ABNORMAL LOW (ref 3.87–5.11)
RDW: 15.9 % — ABNORMAL HIGH (ref 11.5–15.5)
WBC: 4.7 10*3/uL (ref 4.0–10.5)

## 2015-04-24 LAB — FERRITIN: Ferritin: 19.2 ng/mL (ref 10.0–291.0)

## 2015-04-24 LAB — VITAMIN B12: Vitamin B-12: 1153 pg/mL — ABNORMAL HIGH (ref 211–911)

## 2015-04-24 MED ORDER — HYDROCODONE-ACETAMINOPHEN 10-325 MG PO TABS: 1.0000 | ORAL_TABLET | Freq: Two times a day (BID) | ORAL | Status: DC

## 2015-04-24 MED ORDER — OXYBUTYNIN CHLORIDE ER 10 MG PO TB24: 10.0000 mg | ORAL_TABLET | Freq: Every day | ORAL | Status: DC

## 2015-04-24 NOTE — Progress Notes (Signed)
Pre-visit discussion using our clinic review tool. No additional management support is needed unless otherwise documented below in the visit note.  
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
## 2015-04-24 NOTE — Patient Instructions (Addendum)
I  HAVE REFILLED YOUR VICODIN PAIN PILL,  IT NEEDS TO LAST YOU 3 MONTHS   IF YOUR IRON STORES ARE BACK TO NORMAL WE WILL SUSPEND YOUR IRON

## 2015-04-26 NOTE — Assessment & Plan Note (Signed)
Managed with annual Reclast injections by Dr Andy Gauss

## 2015-04-26 NOTE — Assessment & Plan Note (Signed)
Stable by recent evaluation. Secondary to NSAIDs and Lasix .  Lab Results  Component Value Date   CREATININE 0.85 01/23/2015

## 2015-04-26 NOTE — Assessment & Plan Note (Signed)
Controlled with vicodin one to two daily .  Contra indication sto NSAID use due to  CKD   Lab Results  Component Value Date   CREATININE 0.85 01/23/2015

## 2015-04-26 NOTE — Assessment & Plan Note (Signed)
She is tolerating Aricept without diarrhea,.

## 2015-04-29 ENCOUNTER — Encounter: Payer: Self-pay | Admitting: *Deleted

## 2015-05-14 ENCOUNTER — Telehealth: Payer: Self-pay | Admitting: *Deleted

## 2015-05-14 ENCOUNTER — Other Ambulatory Visit: Payer: Self-pay | Admitting: Internal Medicine

## 2015-05-14 NOTE — Telephone Encounter (Signed)
Pt advised 90 day prescription at pharmacy

## 2015-05-14 NOTE — Telephone Encounter (Signed)
Patient was given a trial medication,she stated that it works for her. She would like a 90 day supply.-Thanks

## 2015-06-20 ENCOUNTER — Other Ambulatory Visit: Payer: Self-pay | Admitting: Internal Medicine

## 2015-07-05 ENCOUNTER — Encounter: Payer: Self-pay | Admitting: Internal Medicine

## 2015-07-05 ENCOUNTER — Ambulatory Visit (INDEPENDENT_AMBULATORY_CARE_PROVIDER_SITE_OTHER): Payer: PPO | Admitting: Internal Medicine

## 2015-07-05 VITALS — BP 120/66 | HR 50 | Temp 97.8°F | Resp 12 | Ht 61.0 in | Wt 128.5 lb

## 2015-07-05 DIAGNOSIS — H01006 Unspecified blepharitis left eye, unspecified eyelid: Secondary | ICD-10-CM | POA: Insufficient documentation

## 2015-07-05 DIAGNOSIS — M5440 Lumbago with sciatica, unspecified side: Secondary | ICD-10-CM

## 2015-07-05 DIAGNOSIS — F331 Major depressive disorder, recurrent, moderate: Secondary | ICD-10-CM

## 2015-07-05 MED ORDER — HYDROCODONE-ACETAMINOPHEN 10-325 MG PO TABS: 1.0000 | ORAL_TABLET | Freq: Two times a day (BID) | ORAL | Status: DC

## 2015-07-05 MED ORDER — AZITHROMYCIN 1 % OP SOLN: 1.0000 [drp] | Freq: Two times a day (BID) | OPHTHALMIC | Status: DC

## 2015-07-05 NOTE — Patient Instructions (Addendum)
I am refilling your vicodin for use up to twice daily for the next 3 months.  You can return next week for the high dose flu vaccine  When we have it available   I am prescribing azithromycin eye drops for BOTH EYES,  TWICE DAILY

## 2015-07-05 NOTE — Progress Notes (Signed)
Pre-visit discussion using our clinic review tool. No additional management support is needed unless otherwise documented below in the visit note.  
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
## 2015-07-07 NOTE — Progress Notes (Signed)
Subjective:  Patient ID: Meagan Zavala, female    DOB: 07/28/1946  Age: 69 y.o. MRN: [id number]  CC: The primary encounter diagnosis was Blepharitis of left eye. Diagnoses of Low back pain with sciatica, sciatica laterality unspecified, unspecified back pain laterality, unspecified chronicity and Major depressive disorder, recurrent episode, moderate ([place]) were also pertinent to this visit.  HPI Meagan Zavala presents for management of eyelid infection and follow up on chronic conditions including low back pain .   Her left upper eyelid has been red and swollen along the lash hine for weveral days.[name] sbeen applying wam and cool compresses for several days without change and right eyelid now affected as well..  Prior episodes.  Has a cat.  Las episode occurred around the same time cat had issues requiring transport  to vet.     She was prescribed vicodin at last  Visit and has tolerated the medication well.  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  Patient is using vicodin twice daily . Patient has not had an escalation of use of narcotics .  She remains very anxious about her financial situation which is complicated by two freeloading family members who do not work and continually manipulate her for financial assistance. She is unable to deny them assistance because of the emtional manipulation.   Outpatient Prescriptions Prior to Visit  Medication Sig Dispense Refill  . aspirin 81 MG tablet Take 1 tablet (81 mg total) by mouth daily. 30 tablet 11  . calcium citrate-vitamin D 200-200 MG-UNIT TABS Take 3 tablets by mouth daily.     . clonazePAM (KLONOPIN) 0.5 MG tablet 0.25 mg 2 (two) times daily as needed.   0  . donepezil (ARICEPT) 10 MG tablet Take 1 tablet (10 mg total) by mouth at bedtime. TAKE ONE TABLET BY MOUTH AT BEDTIME AS NEEDED 901 tablet 1  . ferrous fumarate (HEMOCYTE - 106 MG FE) 325 (106 FE) MG TABS  tablet Take 1 tablet by mouth daily.    . fish oil-omega-3 fatty acids 1000 MG capsule Take 1 capsule by mouth daily.    Marland Kitchen FLUoxetine (PROZAC) 40 MG capsule Take 1 capsule (40 mg total) by mouth daily. 90 capsule 3  . FLUoxetine HCl 60 MG TABS Take 1 tablet by mouth daily. In winter 30 tablet 2  . meloxicam (MOBIC) 7.5 MG tablet Take 1 tablet (7.5 mg total) by mouth daily. As needed for pain 90 tablet 1  . Multiple Vitamins-Minerals (ICAPS PO) Take by mouth.    Marland Kitchen omeprazole (PRILOSEC) 40 MG capsule TAKE ONE CAPSULE BY MOUTH ONCE DAILY 90 capsule 1  . oxybutynin (DITROPAN XL) 10 MG 24 hr tablet Take 1 tablet (10 mg total) by mouth at bedtime. 30 tablet 5  . phenazopyridine (PYRIDIUM) 200 MG tablet Take 1 tablet (200 mg total) by mouth 3 (three) times daily as needed for pain. 10 tablet 0  . pravastatin (PRAVACHOL) 20 MG tablet TAKE ONE TABLET BY MOUTH ONCE DAILY 90 tablet 0  . solifenacin (VESICARE) 5 MG tablet Take 1 tablet (5 mg total) by mouth daily. 90 tablet 0  . traZODone (DESYREL) 50 MG tablet TAKE ONE TO TWO TABLETS BY MOUTH ONCE DAILY 180 tablet 0  . Vitamin D, Ergocalciferol, (DRISDOL) 50000 UNITS CAPS Take 50,000 Units by mouth every 7 (seven) days.     . vitamin E 400 UNIT capsule Take 400 Units by  mouth daily.    . zoledronic acid (RECLAST) 5 MG/100ML SOLN Every year.    Marland Kitchen HYDROcodone-acetaminophen (NORCO) 10-325 MG per tablet Take 1 tablet by mouth 2 (two) times daily. As needed for severe pain 60 tablet 0  . HYDROcodone-acetaminophen (NORCO) 10-325 MG per tablet Take 1 tablet by mouth 2 (two) times daily. DAILY AS NEEDED FOR SEVERE PAIN 90 tablet 0   No facility-administered medications prior to visit.    Review of Systems;  Patient denies headache, fevers, malaise, unintentional weight loss, skin rash, eye pain, sinus congestion and sinus pain, sore throat, dysphagia,  hemoptysis , cough, dyspnea, wheezing, chest pain, palpitations, orthopnea, edema, abdominal pain, nausea,  melena, diarrhea, constipation, flank pain, dysuria, hematuria, urinary  Frequency, nocturia, numbness, tingling, seizures,  Focal weakness, Loss of consciousness,  Tremor, insomnia, depression, anxiety, and suicidal ideation.      Objective:  BP 120/66 mmHg  Pulse 50  Temp(Src) 97.8 F (36.6 C) (Oral)  Resp 12  Ht 5\' 1"  (1.549 m)  Wt 128 lb 8 oz (58.287 kg)  BMI 24.29 kg/m2  SpO2 98%  BP Readings from Last 3 Encounters:  07/05/15 120/66  04/24/15 106/58  01/23/15 104/64    Wt Readings from Last 3 Encounters:  07/05/15 128 lb 8 oz (58.287 kg)  04/24/15 127 lb (57.607 kg)  01/23/15 122 lb 12 oz (55.679 kg)    General appearance: alert, cooperative and appears stated age Ears: normal TM's and external ear canals both ears Throat: lips, mucosa, and tongue normal; teeth and gums normal Neck: no adenopathy, no carotid bruit, supple, symmetrical, trachea midline and thyroid not enlarged, symmetric, no tenderness/mass/nodules Back: symmetric, no curvature. ROM normal. No CVA tenderness. Lungs: clear to auscultation bilaterally Heart: regular rate and rhythm, S1, S2 normal, no murmur, click, rub or gallop Abdomen: soft, non-tender; bowel sounds normal; no masses,  no organomegaly Pulses: 2+ and symmetric Skin: Skin color, texture, turgor normal. No rashes or lesions Lymph nodes: Cervical, supraclavicular, and axillary nodes normal.  No results found for: HGBA1C  Lab Results  Component Value Date   CREATININE 0.85 01/23/2015   CREATININE 0.9 03/16/2014   CREATININE 0.9 11/09/2013    Lab Results  Component Value Date   WBC 4.7 04/24/2015   HGB 10.2* 04/24/2015   HCT 31.1* 04/24/2015   PLT 138.0* 04/24/2015   GLUCOSE 79 01/23/2015   CHOL 141 11/09/2013   TRIG 40.0 11/09/2013   HDL 68.20 11/09/2013   LDLCALC 65 11/09/2013   ALT 25 01/23/2015   AST 27 01/23/2015   NA 138 01/23/2015   K 3.7 01/23/2015   CL 103 01/23/2015   CREATININE 0.85 01/23/2015   BUN 14  01/23/2015   CO2 29 01/23/2015   TSH 3.25 11/09/2013    No results found.  Assessment & Plan:   Problem List Items Addressed This Visit    LOW BACK PAIN, CHRONIC (Chronic)    Now managed with 2 vicodin daily.  Patient has not had an escalation of use of narcotics and is reminded not to share medication with others or to combine with alcohol or other sedating medications.  Patient is providing urine sample upon request per narcotics contract, Refill on narcotic was given today.       Relevant Medications   HYDROcodone-acetaminophen (NORCO) 10-325 MG tablet   HYDROcodone-acetaminophen (NORCO) 10-325 MG tablet   Major depressive disorder, recurrent episode, moderate (HCC)    Managed with qhs  trazodone AND PROZAC.  Patient tearful today ,  Long discussion about her relationship with niece and nephew who continue to manipulate her emotionally for personal gain.         Blepharitis of left eye - Primary    topical azithromycin for both eyes .  continue warm compresses.        A total of 25 minutes of face to face time was spent with patient more than half of which was spent in counselling about the above mentioned conditions  and coordination of care   I have discontinued Ms. Zwiefelhofer's HYDROcodone-acetaminophen. I have also changed her HYDROcodone-acetaminophen and HYDROcodone-acetaminophen. Additionally, I am having her start on azithromycin. Lastly, I am having her maintain her zoledronic acid, Vitamin D (Ergocalciferol), calcium citrate-vitamin D, Multiple Vitamins-Minerals (ICAPS PO), fish oil-omega-3 fatty acids, vitamin E, donepezil, aspirin, clonazePAM, phenazopyridine, FLUoxetine HCl, FLUoxetine, meloxicam, solifenacin, omeprazole, ferrous fumarate, oxybutynin, pravastatin, and traZODone.  Meds ordered this encounter  Medications  . azithromycin (AZASITE) 1 % ophthalmic solution    Sig: Place 1 drop into both eyes 2 (two) times daily.    Dispense:  2.5 mL    Refill:  2  .  DISCONTD: HYDROcodone-acetaminophen (NORCO) 10-325 MG tablet    Sig: Take 1 tablet by mouth 2 (two) times daily. As needed for severe pain  May refill on or after July 06 2015    Dispense:  60 tablet    Refill:  0  . HYDROcodone-acetaminophen (NORCO) 10-325 MG tablet    Sig: Take 1 tablet by mouth 2 (two) times daily. DAILY AS NEEDED FOR SEVERE PAIN    Dispense:  60 tablet    Refill:  0    May refill on or after August 06 2015  . HYDROcodone-acetaminophen (NORCO) 10-325 MG tablet    Sig: Take 1 tablet by mouth 2 (two) times daily. As needed for severe pain  May refill on or after Sep 05 2015    Dispense:  60 tablet    Refill:  0    Medications Discontinued During This Encounter  Medication Reason  . HYDROcodone-acetaminophen (NORCO) 10-325 MG per tablet Reorder  . HYDROcodone-acetaminophen (NORCO) 10-325 MG per tablet Reorder  . HYDROcodone-acetaminophen (NORCO) 10-325 MG tablet Reorder    Follow-up: Return in about 3 months (around 10/05/2015).   Crecencio Mc, MD

## 2015-07-07 NOTE — Assessment & Plan Note (Signed)
Now managed with 2 vicodin daily.  Patient has not had an escalation of use of narcotics and is reminded not to share medication with others or to combine with alcohol or other sedating medications.  Patient is providing urine sample upon request per narcotics contract, Refill on narcotic was given today.

## 2015-07-07 NOTE — Assessment & Plan Note (Signed)
topical azithromycin for both eyes .  continue warm compresses.

## 2015-07-09 ENCOUNTER — Telehealth: Payer: Self-pay

## 2015-07-09 NOTE — Telephone Encounter (Signed)
PA for Azasite eye drops.  [facility] to fax form to [place].

## 2015-07-10 ENCOUNTER — Telehealth: Payer: Self-pay | Admitting: Internal Medicine

## 2015-07-10 NOTE — Telephone Encounter (Signed)
Pa faxed as ordered and copy sent to scam and billing.

## 2015-07-10 NOTE — Telephone Encounter (Signed)
[facility] left a voice mail stating that they faxed over a form to be completed to consider the authorization for coverage of the patient's Asasite 1% drops. This can't be completed over the phone per the respresentative ,but she did leave her phone number if the fax was not received [phone].

## 2015-07-10 NOTE — Telephone Encounter (Signed)
Filled out form and gave to Juliann Pulse to then fax once signed by Dr. Derrel Nip.

## 2015-07-10 NOTE — Telephone Encounter (Signed)
PA for azithromycin eye drops signed and  return to you

## 2015-07-10 NOTE — Telephone Encounter (Signed)
Pt called about the medication that was prescribed on 07/05/2015 for her eyes was not able to be filled. Pharmacy [facility] in [place] states it needs to be a generic medication. Thank You!

## 2015-07-10 NOTE — Telephone Encounter (Signed)
Faxed form to [facility]

## 2015-07-13 DIAGNOSIS — Z0279 Encounter for issue of other medical certificate: Secondary | ICD-10-CM

## 2015-07-25 ENCOUNTER — Encounter: Payer: Self-pay | Admitting: *Deleted

## 2015-07-25 LAB — HM MAMMOGRAPHY: HM Mammogram: NEGATIVE

## 2015-08-01 ENCOUNTER — Encounter: Payer: Self-pay | Admitting: Internal Medicine

## 2015-08-06 ENCOUNTER — Encounter: Payer: Self-pay | Admitting: Surgical

## 2015-08-12 NOTE — Discharge Instructions (Signed)
INSTRUCTIONS FOLLOWING OCULOPLASTIC SURGERY °AMY M. FOWLER, MD ° °AFTER YOUR EYE SURGERY, THER ARE MANY THINGS THWIHC YOU, THE PATIENT, CAN DO TO ASSURE THE BEST POSSIBLE RESULT FROM YOUR OPERATION.  THIS SHEET SHOULD BE REFERRED TO WHENEVER QUESTIONS ARISE.  IF THERE ARE ANY QUESTIONS NOT ANSWERED HERE, DO NOT HESITATE TO CALL OUR OFFICE AT [phone] OR [phone].  THERE IS ALWAYS [facility] AVAILABLE TO CALL IF QUESTIONS OR PROBLEMS ARISE. ° °VISION: Your vision may be blurred and out of focus after surgery until you are able to stop using your ointment, swelling resolves and your eye(s) heal. This may take 1 to 2 weeks at the least.  If your vision becomes gradually more dim or dark, this is not normal and you need to call our office immediately. ° °EYE CARE: For the first 48 hours after surgery, use ice packs frequently - “20 minutes on, 20 minutes off” - to help reduce swelling and bruising.  Small bags of frozen peas or corn make good ice packs along with cloths soaked in ice water.  If you are wearing a patch or other type of dressing following surgery, keep this on for the amount of time specified by your doctor.  For the first week following surgery, you will need to treat your stitches with great care.  If is OK to shower, but take care to not allow soapy water to run into your eye(s) to help reduce changes of infection.  You may gently clean the eyelashes and around the eye(s) with cotton balls and sterile water, BUT DO NOT RUB THE STITCHES VIGOROUSLY.  Keeping your stitches moist with ointment will help promote healing with minimal scar formation. ° °ACTIVITY: When you leave the surgery center, you should go home, rest and be inactive.  The eye(s) may feel scratchy and keeping the eyes closed will allow for faster healing.  The first week following surgery, avoid straining (anything making the face turn red) or lifting over 20 pounds.  Additionally, avoid bending which causes your head to go below  your waist.  Using your eyes will NOT harm them, so feel free to read, watch television, use the computer, etc as desired.  Driving depends on each individual, so check with your doctor if you have questions about driving. ° °MEDICATIONS:  You will be given a prescription for an ointment to use 4 times a day on your stitches.  You can use the ointment in your eyes if they feel scratchy or irritated.  If you eyelid(s) don’t close completely when you sleep, put some ointment in your eyes before bedtime. ° °EMERGENCY: If you experience SEVERE EYE PAIN OR HEADACHE UNRELIEVED BY TYLENOL OR PERCOCET, NAUSEA OR VOMITING, WORSENING REDNESS, OR WORSENING VISION (ESPECIALLY VISION THAT WA INITIALLY BETTER) CALL [phone] OR [phone] DURING BUSINESS HOURS OR AFTER HOURS. ° °General Anesthesia, Adult, Care After °Refer to this sheet in the next few weeks. These instructions provide you with information on caring for yourself after your procedure. Your health care provider may also give you more specific instructions. Your treatment has been planned according to current medical practices, but problems sometimes occur. Call your health care provider if you have any problems or questions after your procedure. °WHAT TO EXPECT AFTER THE PROCEDURE °After the procedure, it is typical to experience: °· Sleepiness. °· Nausea and vomiting. °HOME CARE INSTRUCTIONS °· For the first 24 hours after general anesthesia: °¨ Have a responsible person with you. °¨ Do not drive a car. If you   are alone, do not take public transportation. °¨ Do not drink alcohol. °¨ Do not take medicine that has not been prescribed by your health care provider. °¨ Do not sign important papers or make important decisions. °¨ You may resume a normal diet and activities as directed by your health care provider. °· Change bandages (dressings) as directed. °· If you have questions or problems that seem related to general anesthesia, call the hospital and ask for  the anesthetist or anesthesiologist on call. °SEEK MEDICAL CARE IF: °· You have nausea and vomiting that continue the day after anesthesia. °· You develop a rash. °SEEK IMMEDIATE MEDICAL CARE IF:  °· You have difficulty breathing. °· You have chest pain. °· You have any allergic problems. °  °This information is not intended to replace advice given to you by your health care provider. Make sure you discuss any questions you have with your health care provider. °  °Document Released: 12/21/2000 Document Revised: 10/05/2014 Document Reviewed: 01/13/2012 °Elsevier Interactive Patient Education ©2016 [employer] ° °

## 2015-08-13 ENCOUNTER — Encounter
Admission: RE | Disposition: A | Discharge status: Home / Self Care | Payer: Self-pay | Source: Ambulatory Visit | Attending: Ophthalmology

## 2015-08-13 ENCOUNTER — Ambulatory Visit: Payer: PPO | Admitting: Anesthesiology

## 2015-08-13 ENCOUNTER — Other Ambulatory Visit: Payer: Self-pay | Admitting: Internal Medicine

## 2015-08-13 ENCOUNTER — Ambulatory Visit
Admission: RE | Admit: 2015-08-13 | Discharge: 2015-08-13 | Disposition: A | Discharge status: Home / Self Care | Payer: PPO | Source: Ambulatory Visit | Attending: Ophthalmology | Admitting: Ophthalmology

## 2015-08-13 ENCOUNTER — Encounter: Payer: Self-pay | Admitting: Anesthesiology

## 2015-08-13 DIAGNOSIS — D649 Anemia, unspecified: Secondary | ICD-10-CM | POA: Diagnosis not present

## 2015-08-13 DIAGNOSIS — Z7982 Long term (current) use of aspirin: Secondary | ICD-10-CM | POA: Diagnosis not present

## 2015-08-13 DIAGNOSIS — F502 Bulimia nervosa: Secondary | ICD-10-CM | POA: Diagnosis not present

## 2015-08-13 DIAGNOSIS — F339 Major depressive disorder, recurrent, unspecified: Secondary | ICD-10-CM | POA: Diagnosis not present

## 2015-08-13 DIAGNOSIS — H02105 Unspecified ectropion of left lower eyelid: Secondary | ICD-10-CM | POA: Insufficient documentation

## 2015-08-13 DIAGNOSIS — H02102 Unspecified ectropion of right lower eyelid: Secondary | ICD-10-CM | POA: Insufficient documentation

## 2015-08-13 DIAGNOSIS — Z79899 Other long term (current) drug therapy: Secondary | ICD-10-CM | POA: Diagnosis not present

## 2015-08-13 DIAGNOSIS — H02403 Unspecified ptosis of bilateral eyelids: Secondary | ICD-10-CM | POA: Diagnosis present

## 2015-08-13 DIAGNOSIS — K219 Gastro-esophageal reflux disease without esophagitis: Secondary | ICD-10-CM | POA: Insufficient documentation

## 2015-08-13 DIAGNOSIS — M1990 Unspecified osteoarthritis, unspecified site: Secondary | ICD-10-CM | POA: Diagnosis not present

## 2015-08-13 DIAGNOSIS — E7800 Pure hypercholesterolemia, unspecified: Secondary | ICD-10-CM | POA: Insufficient documentation

## 2015-08-13 DIAGNOSIS — Z85828 Personal history of other malignant neoplasm of skin: Secondary | ICD-10-CM | POA: Diagnosis not present

## 2015-08-13 DIAGNOSIS — M810 Age-related osteoporosis without current pathological fracture: Secondary | ICD-10-CM | POA: Insufficient documentation

## 2015-08-13 DIAGNOSIS — Z9884 Bariatric surgery status: Secondary | ICD-10-CM | POA: Insufficient documentation

## 2015-08-13 HISTORY — DX: Anemia, unspecified: D64.9

## 2015-08-13 HISTORY — DX: Gastro-esophageal reflux disease without esophagitis: K21.9

## 2015-08-13 HISTORY — DX: Unspecified osteoarthritis, unspecified site: M19.90

## 2015-08-13 HISTORY — DX: Pure hypercholesterolemia, unspecified: E78.00

## 2015-08-13 HISTORY — PX: ECTROPION REPAIR: SHX357

## 2015-08-13 HISTORY — PX: PTOSIS REPAIR: SHX6568

## 2015-08-13 HISTORY — DX: Presence of dental prosthetic device (complete) (partial): Z97.2

## 2015-08-13 SURGERY — REPAIR, BLEPHAROPTOSIS
Anesthesia: Monitor Anesthesia Care | Laterality: Bilateral | Wound class: Clean

## 2015-08-13 MED ORDER — OXYCODONE HCL 5 MG/5ML PO SOLN: 5.0000 mg | Freq: Once | ORAL | Status: DC | PRN

## 2015-08-13 MED ORDER — LACTATED RINGERS IV SOLN
INTRAVENOUS | Status: DC
  Administered 2015-08-13 (×2): via INTRAVENOUS

## 2015-08-13 MED ORDER — LIDOCAINE-EPINEPHRINE 2 %-1:100000 IJ SOLN
INTRAMUSCULAR | Status: DC | PRN
  Administered 2015-08-13: 4.5 mL via OPHTHALMIC
  Administered 2015-08-13: 2 mL via OPHTHALMIC

## 2015-08-13 MED ORDER — ERYTHROMYCIN 5 MG/GM OP OINT
TOPICAL_OINTMENT | OPHTHALMIC | Status: DC | PRN
  Administered 2015-08-13: 1 via OPHTHALMIC

## 2015-08-13 MED ORDER — ALFENTANIL 500 MCG/ML IJ INJ
INJECTION | INTRAMUSCULAR | Status: DC | PRN
  Administered 2015-08-13: 500 ug via INTRAVENOUS

## 2015-08-13 MED ORDER — BSS IO SOLN
INTRAOCULAR | Status: DC | PRN
  Administered 2015-08-13: 15 mL

## 2015-08-13 MED ORDER — TETRACAINE HCL 0.5 % OP SOLN
OPHTHALMIC | Status: DC | PRN
  Administered 2015-08-13: 2 [drp] via OPHTHALMIC

## 2015-08-13 MED ORDER — MIDAZOLAM HCL 2 MG/2ML IJ SOLN
INTRAMUSCULAR | Status: DC | PRN
  Administered 2015-08-13: 0.5 mg via INTRAVENOUS
  Administered 2015-08-13: 1 mg via INTRAVENOUS
  Administered 2015-08-13: 0.5 mg via INTRAVENOUS

## 2015-08-13 MED ORDER — PROPOFOL 500 MG/50ML IV EMUL
INTRAVENOUS | Status: DC | PRN
  Administered 2015-08-13: 75 ug/kg/min via INTRAVENOUS

## 2015-08-13 MED ORDER — OXYCODONE-ACETAMINOPHEN 5-325 MG PO TABS: 1.0000 | ORAL_TABLET | ORAL | Status: DC | PRN

## 2015-08-13 MED ORDER — BACITRACIN 500 UNIT/GM OP OINT: TOPICAL_OINTMENT | OPHTHALMIC | Status: DC

## 2015-08-13 MED ORDER — OXYCODONE HCL 5 MG PO TABS: 5.0000 mg | ORAL_TABLET | Freq: Once | ORAL | Status: DC | PRN

## 2015-08-13 SURGICAL SUPPLY — 37 items
APPLICATOR COTTON TIP WD 3 STR (MISCELLANEOUS) ×9 IMPLANT
BLADE SURG 15 STRL LF DISP TIS (BLADE) ×1 IMPLANT
BLADE SURG 15 STRL SS (BLADE) ×3
CORD BIP STRL DISP 12FT (MISCELLANEOUS) ×3 IMPLANT
DRAPE HEAD BAR (DRAPES) ×3 IMPLANT
GAUZE SPONGE 4X4 12PLY STRL (GAUZE/BANDAGES/DRESSINGS) ×3 IMPLANT
GAUZE SPONGE NON-WVN 2X2 STRL (MISCELLANEOUS) ×10 IMPLANT
GLOVE SURG LX 7.0 MICRO (GLOVE) ×4
GLOVE SURG LX STRL 7.0 MICRO (GLOVE) ×2 IMPLANT
KIT ROOM TURNOVER OR (KITS) IMPLANT
MARKER SKIN XFINE TIP W/RULER (MISCELLANEOUS) ×3 IMPLANT
NEEDLE FILTER BLUNT 18X 1/2SAF (NEEDLE) ×2
NEEDLE FILTER BLUNT 18X1 1/2 (NEEDLE) ×1 IMPLANT
NEEDLE HYPO 30X.5 LL (NEEDLE) ×6 IMPLANT
PACK DRAPE NASAL/ENT (PACKS) ×3 IMPLANT
SOL PREP PVP 2OZ (MISCELLANEOUS) ×3
SOLUTION PREP PVP 2OZ (MISCELLANEOUS) ×1 IMPLANT
SPONGE VERSALON 2X2 STRL (MISCELLANEOUS) ×20
SUT CHROMIC 4-0 (SUTURE)
SUT CHROMIC 4-0 M2 12X2 ARM (SUTURE)
SUT CHROMIC 5 0 P 3 (SUTURE) IMPLANT
SUT ETHILON 4 0 CL P 3 (SUTURE) IMPLANT
SUT MERSILENE 4-0 S-2 (SUTURE) ×6 IMPLANT
SUT PDS AB 4-0 P3 18 (SUTURE) IMPLANT
SUT PLAIN GUT (SUTURE) ×6 IMPLANT
SUT PROLENE 5 0 P 3 (SUTURE) IMPLANT
SUT PROLENE 6 0 P 1 18 (SUTURE) ×6 IMPLANT
SUT SILK 4 0 G 3 (SUTURE) IMPLANT
SUT VIC AB 5-0 P-3 18X BRD (SUTURE) IMPLANT
SUT VIC AB 5-0 P3 18 (SUTURE)
SUT VICRYL 6-0  S14 CTD (SUTURE)
SUT VICRYL 6-0 S14 CTD (SUTURE) IMPLANT
SUT VICRYL 7 0 TG140 8 (SUTURE) IMPLANT
SUTURE CHRMC 4-0 M2 12X2 ARM (SUTURE) IMPLANT
SYR 3ML LL SCALE MARK (SYRINGE) ×3 IMPLANT
SYRINGE 10CC LL (SYRINGE) ×3 IMPLANT
WATER STERILE IRR 500ML POUR (IV SOLUTION) ×3 IMPLANT

## 2015-08-13 NOTE — Interval H&P Note (Signed)
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 After consideration of risks, benefits and other options for treatment, the patient has consented to  Procedure(s): PTOSIS REPAIR (Bilateral) REPAIR OF ECTROPION (Bilateral) as a surgical intervention .  The patient's history has been reviewed, patient examined, no change in status, stable for surgery.  I have reviewed the patient's chart and labs.  Questions were answered to the patient's satisfaction.     Vickki Muff, Jeyda Siebel M

## 2015-08-13 NOTE — Anesthesia Procedure Notes (Signed)
Procedure Name: MAC Performed by: Zylee Marchiano Pre-anesthesia Checklist: Patient identified, Emergency Drugs available, Suction available, Patient being monitored and Timeout performed Patient Re-evaluated:Patient Re-evaluated prior to inductionOxygen Delivery Method: Nasal cannula       

## 2015-08-13 NOTE — Transfer of Care (Signed)
Immediate Anesthesia Transfer of Care Note  Patient: Meagan Zavala  Procedure(s) Performed: Procedure(s): PTOSIS REPAIR (Bilateral) REPAIR OF ECTROPION (Bilateral)  Patient Location: PACU  Anesthesia Type: MAC  Level of Consciousness: awake, alert  and patient cooperative  Airway and Oxygen Therapy: Patient Spontanous Breathing and Patient connected to supplemental oxygen  Post-op Assessment: Post-op Vital signs reviewed, Patient's Cardiovascular Status Stable, Respiratory Function Stable, Patent Airway and No signs of Nausea or vomiting  Post-op Vital Signs: Reviewed and stable  Complications: No apparent anesthesia complications

## 2015-08-13 NOTE — Anesthesia Postprocedure Evaluation (Signed)
  Anesthesia Post-op Note  Patient: Meagan Zavala  Procedure(s) Performed: Procedure(s): PTOSIS REPAIR (Bilateral) REPAIR OF ECTROPION (Bilateral)  Anesthesia type:MAC  Patient location: PACU  Post pain: Pain level controlled  Post assessment: Post-op Vital signs reviewed, Patient's Cardiovascular Status Stable, Respiratory Function Stable, Patent Airway and No signs of Nausea or vomiting  Post vital signs: Reviewed and stable  Last Vitals:  Filed Vitals:   08/13/15 1036  BP: 107/62  Pulse: 67  Temp: 36.1 C  Resp: 14    Level of consciousness: awake, alert  and patient cooperative  Complications: No apparent anesthesia complications

## 2015-08-13 NOTE — Op Note (Signed)
Preoperative Diagnosis:  1. Visually significant blepharoptosis both Upper Eyelid(s) 2. Lower eyelid laxity with ectropion,  both  lower eyelid(s).  Postoperative Diagnosis:  Same.  Procedure(s) Performed:   1. Blepharoptosis repair with levator aponeurosis advancement both Upper Eyelid(s) 2.  Lateral tarsal strip procedure,  both  lower eyelid(s).  Teaching Surgeon: Philis Pique. Vickki Muff, M.D.  Assistants: none  Anesthesia: MAC  Specimens: None.  Estimated Blood Loss: Minimal.  Complications: None.  Operative Findings: None Dictated  Procedure:   Allergies were reviewed and the patient has No Known Allergies..    After the risks, benefits, complications and alternatives were discussed with the patient, appropriate informed consent was obtained.  While seated in an upright position and looking in primary gaze, the mid pupillary line was marked on the upper eyelid margins bilaterally. The patient was then brought to the operating suite and reclined supine.  Timeout was conducted and the patient was sedated.  Local anesthetic consisting of a 50-50 mixture of 2% lidocaine with epinephrine and 0.75% bupivacaine with added Hylenex was injected subcutaneously to both upper eyelid(s). Additional anesthetic was  injected subcutaneously to the bilateral lateral canthal region(s) and lower eyelid(s). Additional anesthetic was injected subconjunctivally to the bilateral lower eyelid(s). Finally, anesthetic was injected down to the periosteum of the bilateral lateral orbital rim(s). After adequate local was instilled, the patient was prepped and draped in the usual sterile fashion for eyelid surgery.   Attention was turned to the upper eyelids. A 9 mm upper eyelid crease incision line was marked with calipers on both upper eyelid(s).  A pinch test was used to estimate the amount of excess skin to remove and this was marked in standard blepharoplasty style fashion. Attention was turned to the  right  upper eyelid. A #15 blade was used to open the premarked incision line. A skin only flap was excised and hemostasis was obtained with bipolar cautery.    Westcott scissors were then used to transect through orbicularis down to the tarsal plate. Epitarsus was dissected to create a smooth surface to suture to. Dissection was then carried superiorly in the plane between orbicularis and orbital septum. Once the preaponeurotic fat pocket was identified, the orbital septum was opened. This revealed the levator and its aponeurosis.    Attention was then turned to the opposite eyelid where the same procedure was performed in the same manner. Hemostasis was obtained with bipolar cautery throughout.   3 interrupted 6-0 Prolene sutures were then passed partial thickness through the tarsal plates of both upper eyelid(s). These sutures were placed in line with the mid pupillary, medial limbal, and lateral limbal lines. The sutures were fixed to the levator aponeurosis and adjusted until a nice lid height and contour were achieved. Once nice symmetry was achieved, the skin incisions were closed with a running 6-0 fast absorbing plain suture.    Attention was turned to the right lateral canthal angle. Westcott scissors were used to create a lateral canthotomy. Hemostasis was obtained with bipolar cautery. An inferior cantholysis was then performed with additional bipolar hemostasis. The anterior and posterior lamella of the lid were divided for approximately 8 mm.  A strip of the epithelium was excised off the superior margin of the tarsal strip and conjunctiva and retractors were incised off the inferior margin of the tarsal strip. A double-armed 4-0 Mersilene suture was then passed each arm through the terminal portion of the tarsal strip. Each arm of the suture was then passed through the periosteum of the  inner portion of the lateral orbital rim at the level of Whitnall's tubercle. The sutures were advanced and  this provided nice elevation and tightening of the lower eyelid. Once the suture was secured, a thin strip of follicle-bearing skin was excised. The lateral canthal angle was reformed with an interrupted 6-0 fast absorbing plain suture. Orbicularis was reapproximated with horizontal subcuticular 6-0 fast absorbing plain gut sutures. The skin was closed with interrupted 6-0 fast absorbing plain gut sutures.   Attention was then turned to the opposite eyelid where the same procedure was performed in the same manner.   The patient tolerated the procedure well.  Erythromycin ophthalmic ointment was applied to the incision site(s) followed by ice packs. The patient was taken to the recovery area where she recovered without difficulty.  Post-Op Plan/Instructions:  The patient was instructed to use ice packs frequently for the next 48 hours. She was instructed to use bacitracin ointment on Her incisions 4 times a day for the next 12 to 14 days. She was given a prescription for Percocet for pain control should Tylenol not be effective. She was asked to follow up at the [facility] in [place], [place]  in 2 weeks' time or sooner as needed for problems.  Teaching Surgeon Attestation: None  Timonthy Hovater M. Vickki Muff, M.D. Attending,Ophthalmology

## 2015-08-13 NOTE — H&P (Signed)
  See history and physical completed by [facility] that is on the chart and signed.

## 2015-08-13 NOTE — Anesthesia Preprocedure Evaluation (Addendum)
Anesthesia Evaluation  Patient identified by MRN, date of birth, ID band  Reviewed: NPO status   History of Anesthesia Complications Negative for: history of anesthetic complications  Airway Mallampati: II  TM Distance: >3 FB Neck ROM: full    Dental  (+) Upper Dentures, Lower Dentures   Pulmonary neg pulmonary ROS, former smoker (2010),    Pulmonary exam normal        Cardiovascular Exercise Tolerance: Good Normal cardiovascular exam  Lipids    Neuro/Psych Anxiety Depression Memory loss; Macular degen  negative psych ROS   GI/Hepatic Neg liver ROS, GERD  Medicated,Gastric bypass 1977 > lost 150 lbs.   Endo/Other  negative endocrine ROS  Renal/GU Renal disease (denies)negative Renal ROS   Bladder incontinence     Musculoskeletal  (+) Arthritis ,   Abdominal   Peds  Hematology  (+) anemia ,   Anesthesia Other Findings   Reproductive/Obstetrics negative OB ROS                            Anesthesia Physical Anesthesia Plan  ASA: II  Anesthesia Plan: MAC   Post-op Pain Management:    Induction:   Airway Management Planned:   Additional Equipment:   Intra-op Plan:   Post-operative Plan:   Informed Consent: I have reviewed the patients History and Physical, chart, labs and discussed the procedure including the risks, benefits and alternatives for the proposed anesthesia with the patient or authorized representative who has indicated his/her understanding and acceptance.     Plan Discussed with: CRNA  Anesthesia Plan Comments:         Anesthesia Quick Evaluation

## 2015-08-14 ENCOUNTER — Encounter: Payer: Self-pay | Admitting: Ophthalmology

## 2015-08-14 ENCOUNTER — Other Ambulatory Visit: Payer: Self-pay | Admitting: Internal Medicine

## 2015-08-20 ENCOUNTER — Encounter: Payer: Self-pay | Admitting: Emergency Medicine

## 2015-08-20 ENCOUNTER — Emergency Department: Payer: PPO

## 2015-08-20 ENCOUNTER — Emergency Department
Admission: EM | Admit: 2015-08-20 | Discharge: 2015-08-20 | Disposition: A | Discharge status: Home / Self Care | Payer: PPO | Attending: Emergency Medicine | Admitting: Emergency Medicine

## 2015-08-20 DIAGNOSIS — R05 Cough: Secondary | ICD-10-CM | POA: Insufficient documentation

## 2015-08-20 DIAGNOSIS — Z7982 Long term (current) use of aspirin: Secondary | ICD-10-CM | POA: Diagnosis not present

## 2015-08-20 DIAGNOSIS — N390 Urinary tract infection, site not specified: Secondary | ICD-10-CM | POA: Insufficient documentation

## 2015-08-20 DIAGNOSIS — Z79899 Other long term (current) drug therapy: Secondary | ICD-10-CM | POA: Diagnosis not present

## 2015-08-20 DIAGNOSIS — Z792 Long term (current) use of antibiotics: Secondary | ICD-10-CM | POA: Insufficient documentation

## 2015-08-20 DIAGNOSIS — R42 Dizziness and giddiness: Secondary | ICD-10-CM | POA: Diagnosis present

## 2015-08-20 DIAGNOSIS — Z87891 Personal history of nicotine dependence: Secondary | ICD-10-CM | POA: Diagnosis not present

## 2015-08-20 DIAGNOSIS — R531 Weakness: Secondary | ICD-10-CM | POA: Insufficient documentation

## 2015-08-20 LAB — URINALYSIS COMPLETE WITH MICROSCOPIC (ARMC ONLY)
BILIRUBIN URINE: NEGATIVE
Bacteria, UA: NONE SEEN
GLUCOSE, UA: NEGATIVE mg/dL
Hgb urine dipstick: NEGATIVE
KETONES UR: NEGATIVE mg/dL
NITRITE: NEGATIVE
PROTEIN: NEGATIVE mg/dL
SPECIFIC GRAVITY, URINE: 1.011 (ref 1.005–1.030)
pH: 6 (ref 5.0–8.0)

## 2015-08-20 LAB — BASIC METABOLIC PANEL
ANION GAP: 6 (ref 5–15)
BUN: 29 mg/dL — AB (ref 6–20)
CALCIUM: 9.5 mg/dL (ref 8.9–10.3)
CO2: 25 mmol/L (ref 22–32)
CREATININE: 0.84 mg/dL (ref 0.44–1.00)
Chloride: 103 mmol/L (ref 101–111)
GFR calc Af Amer: >60 mL/min (ref >60)
GLUCOSE: 100 mg/dL — AB (ref 65–99)
Potassium: 4.6 mmol/L (ref 3.5–5.1)
Sodium: 134 mmol/L — ABNORMAL LOW (ref 135–145)

## 2015-08-20 LAB — CBC
HCT: 34.3 % — ABNORMAL LOW (ref 35.0–47.0)
HEMOGLOBIN: 11.6 g/dL — AB (ref 12.0–16.0)
MCH: 31 pg (ref 26.0–34.0)
MCHC: 33.8 g/dL (ref 32.0–36.0)
MCV: 91.6 fL (ref 80.0–100.0)
PLATELETS: 198 10*3/uL (ref 150–440)
RBC: 3.74 MIL/uL — ABNORMAL LOW (ref 3.80–5.20)
RDW: 14 % (ref 11.5–14.5)
WBC: 6.8 10*3/uL (ref 3.6–11.0)

## 2015-08-20 LAB — LACTIC ACID, PLASMA: Lactic Acid, Venous: 1 mmol/L (ref 0.5–2.0)

## 2015-08-20 LAB — TROPONIN I: Troponin I: <0.03 ng/mL (ref <0.031)

## 2015-08-20 MED ORDER — POLYETHYLENE GLYCOL 3350 17 G PO PACK
PACK | ORAL | Status: AC
  Administered 2015-08-20: 17 g via ORAL
  Filled 2015-08-20: qty 1

## 2015-08-20 MED ORDER — POLYETHYLENE GLYCOL 3350 17 G PO PACK
17.0000 g | PACK | Freq: Every day | ORAL | Status: DC
  Administered 2015-08-20: 17 g via ORAL

## 2015-08-20 MED ORDER — CEFTRIAXONE SODIUM 1 G IJ SOLR
INTRAMUSCULAR | Status: AC
  Administered 2015-08-20: 1 g via INTRAVENOUS
  Filled 2015-08-20: qty 10

## 2015-08-20 MED ORDER — DEXTROSE 5 % IV SOLN
1.0000 g | Freq: Once | INTRAVENOUS | Status: AC
  Administered 2015-08-20: 1 g via INTRAVENOUS

## 2015-08-20 MED ORDER — POLYETHYLENE GLYCOL 3350 17 G PO PACK: 17.0000 g | PACK | Freq: Every day | ORAL | Status: DC

## 2015-08-20 MED ORDER — CEPHALEXIN 500 MG PO CAPS: 500.0000 mg | ORAL_CAPSULE | Freq: Three times a day (TID) | ORAL | Status: DC

## 2015-08-20 MED ORDER — SODIUM CHLORIDE 0.9 % IV BOLUS (SEPSIS)
500.0000 mL | Freq: Once | INTRAVENOUS | Status: AC
Start: 2015-08-20 — End: 2015-08-20
  Administered 2015-08-20: 500 mL via INTRAVENOUS

## 2015-08-20 NOTE — ED Notes (Signed)
Pt to ED with c/o dizziness, weakness, cough and congestion, states she had the coughing and congestion and then she got better than about 1 week ago symptoms started back, states she had eye surgery last week

## 2015-08-20 NOTE — ED Notes (Signed)
Pt states she "feels like she is gonna die", pt states cough and congestion, states she recently went on a cruise and was started on abx, pt awake and alert

## 2015-08-20 NOTE — ED Provider Notes (Signed)
[facility] Emergency Department Provider Note    ____________________________________________  Time seen: 59  I have reviewed the triage vital signs and the nursing notes.   HISTORY  Chief Complaint Weakness; Dizziness; and Cough   History limited by: Not Limited   HPI Meagan Zavala is a 69 y.o. female who presents to the emergency department today because of 5 weeks of feeling unwell.She stated it all started when she went to [facility] and then on a cruise. She states that while on the cruise she started feeling unwell. She was given a Z-Pak which she states does not help. Her symptoms have waxed and waned slightly. She describes weakness, cough and congestion. She also states she has been having some dizziness. Patient has not had any measured fevers although states she feels like she might be hot at times. She states she thinks she might have fevers because she is constipated. She denies any associated abdominal pain or chest pain.    Past Medical History  Diagnosis Date  . BULIMIA many decades of this    patient keeps alive by drinking ensure that she cannot purge  . Major depressive disorder, recurrent episode, severe ([place])     well treated with SSRI's  . KIDNEY DISEASE, CHRONIC, STAGE III 2010    Cr 1.3 -> .84, probably from NSAID's and lasix  . LOW BACK PAIN, CHRONIC     takes tylenol  . Basal cell carcinoma of face   . LEAKAGE, CONTINUOUS URINE 07/30/2009  . Macular degeneration disease     [place], now [place]  . OSTEOPOROSIS     severe, treated with infusions, followed by endocrine in [place]  . GERD (gastroesophageal reflux disease)   . Anemia   . Arthritis   . Hypercholesteremia   . Wears dentures     full upper and lower    Patient Active Problem List   Diagnosis Date Noted  . Blepharitis of left eye 07/05/2015  . Fatty liver disease, nonalcoholic [id number]  . Other and unspecified hyperlipidemia 09/01/2013  .  Unspecified vitamin D deficiency 09/01/2013  . Chronic cough 09/29/2012  . Medicare annual wellness visit, subsequent 09/16/2012  . Polyarthritis 04/16/2012  . Macular degeneration disease   . Neck pain 03/03/2011  . Insomnia 11/25/2010  . MCI (mild cognitive impairment) with memory loss 11/18/2010  . BULIMIA 07/30/2009  . Major depressive disorder, recurrent episode, moderate ([place]) 07/30/2009  . GERD 07/30/2009  . KIDNEY DISEASE, CHRONIC, STAGE III 07/30/2009  . LOW BACK PAIN, CHRONIC 07/30/2009  . Osteoporosis 07/30/2009  . Urinary incontinence 07/30/2009    Past Surgical History  Procedure Laterality Date  . 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 Surgical excision of excessive skin  1979  . Colon surgery  1980    partial colectomy  . Carpal tunnel release    . Tubal ligation    . Cataract extraction    . Ptosis repair Bilateral 08/13/2015    Procedure: PTOSIS REPAIR;  Surgeon: Karle Starch, MD;  Location: [facility];  Service: Ophthalmology;  Laterality: Bilateral;  . Ectropion repair Bilateral 08/13/2015    Procedure: REPAIR OF ECTROPION;  Surgeon: Karle Starch, MD;  Location: [facility];  Service: Ophthalmology;  Laterality: Bilateral;    Current Outpatient Rx  Name  Route  Sig  Dispense  Refill  . aspirin 81 MG tablet   Oral   Take 1 tablet (81 mg total)  by mouth daily.   30 tablet   11   . azithromycin (AZASITE) 1 % ophthalmic solution   Both Eyes   Place 1 drop into both eyes 2 (two) times daily.   2.5 mL   2   . bacitracin ophthalmic ointment      Use on sutures 4 times a day for 12-14 days   3.5 g   3   . calcium citrate-vitamin D 200-200 MG-UNIT TABS   Oral   Take 3 tablets by mouth daily.          . clonazePAM (KLONOPIN) 0.5 MG tablet      0.25 mg 2 (two) times daily as needed.       0   . docusate sodium (COLACE) 100 MG capsule   Oral   Take 100 mg by mouth 2 (two) times daily.         Marland Kitchen  donepezil (ARICEPT) 10 MG tablet   Oral   Take 1 tablet (10 mg total) by mouth at bedtime. TAKE ONE TABLET BY MOUTH AT BEDTIME AS NEEDED   901 tablet   1     Keep on file for future refills   . ferrous fumarate (HEMOCYTE - 106 MG FE) 325 (106 FE) MG TABS tablet   Oral   Take 1 tablet by mouth daily.         . fish oil-omega-3 fatty acids 1000 MG capsule   Oral   Take 1 capsule by mouth daily.         Marland Kitchen FLUoxetine (PROZAC) 40 MG capsule   Oral   Take 1 capsule (40 mg total) by mouth daily.   90 capsule   3     Starting in May.  Replaces 60 mg dose   . FLUoxetine HCl 60 MG TABS   Oral   Take 1 tablet by mouth daily. In winter   30 tablet   2   . HYDROcodone-acetaminophen (NORCO) 10-325 MG tablet   Oral   Take 1 tablet by mouth 2 (two) times daily. DAILY AS NEEDED FOR SEVERE PAIN   60 tablet   0     May refill on or after August 06 2015   . HYDROcodone-acetaminophen (NORCO) 10-325 MG tablet   Oral   Take 1 tablet by mouth 2 (two) times daily. As needed for severe pain  May refill on or after Sep 05 2015   60 tablet   0   . meloxicam (MOBIC) 7.5 MG tablet   Oral   Take 1 tablet (7.5 mg total) by mouth daily. As needed for pain   90 tablet   1   . Multiple Vitamins-Minerals (ICAPS PO)   Oral   Take by mouth.         Marland Kitchen omeprazole (PRILOSEC) 40 MG capsule      TAKE ONE CAPSULE BY MOUTH ONCE DAILY   90 capsule   1   . oxybutynin (DITROPAN XL) 10 MG 24 hr tablet   Oral   Take 1 tablet (10 mg total) by mouth at bedtime.   30 tablet   5   . oxyCODONE-acetaminophen (PERCOCET) 5-325 MG tablet   Oral   Take 1 tablet by mouth every 4 (four) hours as needed for severe pain.   6 tablet   0   . phenazopyridine (PYRIDIUM) 200 MG tablet   Oral   Take 1 tablet (200 mg total) by mouth 3 (three) times daily as needed for  pain.   10 tablet   0   . pravastatin (PRAVACHOL) 20 MG tablet      TAKE ONE TABLET BY MOUTH ONCE DAILY   90 tablet   0   .  solifenacin (VESICARE) 5 MG tablet   Oral   Take 1 tablet (5 mg total) by mouth daily.   90 tablet   0   . traZODone (DESYREL) 50 MG tablet      TAKE ONE TO TWO TABLETS BY MOUTH ONCE DAILY   180 tablet   0   . traZODone (DESYREL) 50 MG tablet      TAKE ONE TO TWO TABLETS BY MOUTH ONCE DAILY   180 tablet   0   . Vitamin D, Ergocalciferol, (DRISDOL) 50000 UNITS CAPS   Oral   Take 50,000 Units by mouth every 7 (seven) days.          . vitamin E 400 UNIT capsule   Oral   Take 400 Units by mouth daily.         . zoledronic acid (RECLAST) 5 MG/100ML SOLN      Every year.           Allergies Review of patient's allergies indicates no known allergies.  Family History  Problem Relation Age of Onset  . Cancer Mother     breast,   . Cancer Father     lung  . Cancer Sister     lung, brain  . Cancer Brother     throat    Social History Social History  Substance Use Topics  . Smoking status: Former Smoker    Quit date: 10/02/2010  . Smokeless tobacco: Never Used  . 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 Respiratory: Negative for shortness of breath. Gastrointestinal: Negative for abdominal pain, vomiting and diarrhea. Genitourinary: Negative for dysuria. Musculoskeletal: Negative for back pain. Skin: Negative for rash. Neurological: Positive for dizziness  10-point ROS otherwise negative.  ____________________________________________   PHYSICAL EXAM:  VITAL SIGNS: ED Triage Vitals  Enc Vitals Group     BP 08/20/15 1751 90/67 mmHg     Pulse Rate 08/20/15 1751 66     Resp 08/20/15 1751 18     Temp 08/20/15 1751 98.5 F (36.9 C)     Temp Source 08/20/15 1751 Oral     SpO2 08/20/15 1751 100 %     Weight 08/20/15 1751 122 lb (55.339 kg)     Height 08/20/15 1751 5\' 1"  (1.549 m)   Constitutional: Alert and  oriented. Well appearing and in no distress. Eyes: Conjunctivae are normal. PERRL. Normal extraocular movements. ENT   Head: Normocephalic and atraumatic.   Nose: No congestion/rhinnorhea.   Mouth/Throat: Mucous membranes are moist.   Neck: No stridor. Hematological/Lymphatic/Immunilogical: No cervical lymphadenopathy. Cardiovascular: Normal rate, regular rhythm.  No murmurs, rubs, or gallops. Respiratory: Normal respiratory effort without tachypnea nor retractions. Breath sounds are clear and equal bilaterally. No wheezes/rales/rhonchi. Gastrointestinal: Soft and nontender. No distention.  Genitourinary: Deferred Musculoskeletal: Normal range of motion in all extremities. No joint effusions.  No lower extremity tenderness nor edema. Neurologic:  Normal speech and language. No gross focal neurologic deficits are appreciated.  Skin:  Skin is warm, dry and intact. No rash noted. Psychiatric: Mood and affect are normal. Speech and behavior are normal. Patient exhibits appropriate insight and  judgment.  ____________________________________________    LABS (pertinent positives/negatives)  Labs Reviewed  BASIC METABOLIC PANEL - Abnormal; Notable for the following:    Sodium 134 (*)    Glucose, Bld 100 (*)    BUN 29 (*)    All other components within normal limits  CBC - Abnormal; Notable for the following:    RBC 3.74 (*)    Hemoglobin 11.6 (*)    HCT 34.3 (*)    All other components within normal limits  URINALYSIS COMPLETEWITH MICROSCOPIC ([facility] ONLY) - Abnormal; Notable for the following:    Color, Urine YELLOW (*)    APPearance HAZY (*)    Leukocytes, UA 3+ (*)    Squamous Epithelial / LPF 0-5 (*)    All other components within normal limits  CULTURE, BLOOD (ROUTINE X 2)  CULTURE, BLOOD (ROUTINE X 2)  LACTIC ACID, PLASMA  TROPONIN I  LACTIC ACID, PLASMA     ____________________________________________   EKG  I, Nance Pear, attending physician,  personally viewed and interpreted this EKG  EKG Time: 1805 Rate: 70 Rhythm: NSR Axis: left axis deviation Intervals: qtc 438 QRS: narrow, q waves III, V1 ST changes: no st elevation Impression: abnormal ekg ____________________________________________    RADIOLOGY  CXR  IMPRESSION: Pulmonary vascular congestion. No overt failure however.   ____________________________________________   PROCEDURES  Procedure(s) performed: None  Critical Care performed: No  ____________________________________________   INITIAL IMPRESSION / ASSESSMENT AND PLAN / ED COURSE  Pertinent labs & imaging results that were available during my care of the patient were reviewed by me and considered in my medical decision making (see chart for details).  Patient presented to the emergency department today because of 5 weeks of dizziness weakness and concerns for cough and congestion. Chest x-ray here without any acute or obvious etiology of the patient's symptoms. The patient's urine did come back concerning for urinary tract infection. Patient without any leukocytosis or lactic acidosis. Initial blood pressure slightly low however patient had good blood pressure after small fluid bolus and patient did feel improved. At this point I feel that patient is safe for outpatient treatment. I will give a dose of IV antibiotics here and prescription for oral antibiotics. Additionally I discussed with the patient the importance of following up with primary care doctor tomorrow. Patient is comfortable with the plan.  ____________________________________________   FINAL CLINICAL IMPRESSION(S) / ED DIAGNOSES  Final diagnoses:  UTI (lower urinary tract infection)     Nance Pear, MD 08/20/15 2257

## 2015-08-20 NOTE — Discharge Instructions (Signed)
Please seek medical attention for any high fevers, chest pain, shortness of breath, change in behavior, persistent vomiting, bloody stool or any other new or concerning symptoms. ° ° °Urinary Tract Infection °Urinary tract infections (UTIs) can develop anywhere along your urinary tract. Your urinary tract is your body's drainage system for removing wastes and extra water. Your urinary tract includes two kidneys, two ureters, a bladder, and a urethra. Your kidneys are a pair of bean-shaped organs. Each kidney is about the size of your fist. They are located below your ribs, one on each side of your spine. °CAUSES °Infections are caused by microbes, which are microscopic organisms, including fungi, viruses, and bacteria. These organisms are so small that they can only be seen through a microscope. Bacteria are the microbes that most commonly cause UTIs. °SYMPTOMS  °Symptoms of UTIs may vary by age and gender of the patient and by the location of the infection. Symptoms in young women typically include a frequent and intense urge to urinate and a painful, burning feeling in the bladder or urethra during urination. Older women and men are more likely to be tired, shaky, and weak and have muscle aches and abdominal pain. A fever may mean the infection is in your kidneys. Other symptoms of a kidney infection include pain in your back or sides below the ribs, nausea, and vomiting. °DIAGNOSIS °To diagnose a UTI, your caregiver will ask you about your symptoms. Your caregiver will also ask you to provide a urine sample. The urine sample will be tested for bacteria and white blood cells. White blood cells are made by your body to help fight infection. °TREATMENT  °Typically, UTIs can be treated with medication. Because most UTIs are caused by a bacterial infection, they usually can be treated with the use of antibiotics. The choice of antibiotic and length of treatment depend on your symptoms and the type of bacteria causing  your infection. °HOME CARE INSTRUCTIONS °· If you were prescribed antibiotics, take them exactly as your caregiver instructs you. Finish the medication even if you feel better after you have only taken some of the medication. °· Drink enough water and fluids to keep your urine clear or pale yellow. °· Avoid caffeine, tea, and carbonated beverages. They tend to irritate your bladder. °· Empty your bladder often. Avoid holding urine for long periods of time. °· Empty your bladder before and after sexual intercourse. °· After a bowel movement, women should cleanse from front to back. Use each tissue only once. °SEEK MEDICAL CARE IF:  °· You have back pain. °· You develop a fever. °· Your symptoms do not begin to resolve within 3 days. °SEEK IMMEDIATE MEDICAL CARE IF:  °· You have severe back pain or lower abdominal pain. °· You develop chills. °· You have nausea or vomiting. °· You have continued burning or discomfort with urination. °MAKE SURE YOU:  °· Understand these instructions. °· Will watch your condition. °· Will get help right away if you are not doing well or get worse. °  °This information is not intended to replace advice given to you by your health care provider. Make sure you discuss any questions you have with your health care provider. °  °Document Released: 06/24/2005 Document Revised: 06/05/2015 Document Reviewed: 10/23/2011 °Elsevier Interactive Patient Education ©2016 [employer] ° °

## 2015-08-20 NOTE — ED Notes (Signed)
Pt up to bathroom to attempt urine sample

## 2015-08-25 LAB — CULTURE, BLOOD (ROUTINE X 2)
CULTURE: NO GROWTH
Culture: NO GROWTH

## 2015-08-28 ENCOUNTER — Telehealth: Payer: Self-pay | Admitting: Internal Medicine

## 2015-08-28 ENCOUNTER — Other Ambulatory Visit: Payer: Self-pay

## 2015-08-28 NOTE — Telephone Encounter (Signed)
Pt called requesting a 90 day supply of prozac 60mg . Given to pharamcy

## 2015-08-28 NOTE — Telephone Encounter (Signed)
Pt does not want to see another provider after being offered an appt. She will call back if she changes her mind.

## 2015-08-28 NOTE — Telephone Encounter (Signed)
Pt called about not feeling well she has been sick off and on for 5 weeks. She has had some gastro issues. No appt avail to sch. Pt does not want to see another provider. Let me know where to sch pt. Thank You!

## 2015-09-11 ENCOUNTER — Other Ambulatory Visit: Payer: Self-pay | Admitting: Internal Medicine

## 2015-10-01 ENCOUNTER — Telehealth: Payer: Self-pay | Admitting: *Deleted

## 2015-10-01 ENCOUNTER — Other Ambulatory Visit: Payer: PPO

## 2015-10-01 ENCOUNTER — Other Ambulatory Visit (INDEPENDENT_AMBULATORY_CARE_PROVIDER_SITE_OTHER): Payer: PPO

## 2015-10-01 DIAGNOSIS — E559 Vitamin D deficiency, unspecified: Secondary | ICD-10-CM | POA: Diagnosis not present

## 2015-10-01 DIAGNOSIS — R5383 Other fatigue: Secondary | ICD-10-CM | POA: Diagnosis not present

## 2015-10-01 LAB — CBC WITH DIFFERENTIAL/PLATELET
BASOS PCT: 0.3 % (ref 0.0–3.0)
Basophils Absolute: 0 10*3/uL (ref 0.0–0.1)
EOS ABS: 0.1 10*3/uL (ref 0.0–0.7)
EOS PCT: 1.3 % (ref 0.0–5.0)
HCT: 33.3 % — ABNORMAL LOW (ref 36.0–46.0)
HEMOGLOBIN: 10.7 g/dL — AB (ref 12.0–15.0)
LYMPHS PCT: 21 % (ref 12.0–46.0)
Lymphs Abs: 1.1 10*3/uL (ref 0.7–4.0)
MCHC: 32 g/dL (ref 30.0–36.0)
MCV: 88.8 fl (ref 78.0–100.0)
MONOS PCT: 8.6 % (ref 3.0–12.0)
Monocytes Absolute: 0.4 10*3/uL (ref 0.1–1.0)
NEUTROS ABS: 3.6 10*3/uL (ref 1.4–7.7)
Neutrophils Relative %: 68.8 % (ref 43.0–77.0)
Platelets: 236 10*3/uL (ref 150.0–400.0)
RBC: 3.75 Mil/uL — AB (ref 3.87–5.11)
RDW: 14.3 % (ref 11.5–15.5)
WBC: 5.2 10*3/uL (ref 4.0–10.5)

## 2015-10-01 LAB — COMPREHENSIVE METABOLIC PANEL
ALT: 18 U/L (ref 0–35)
AST: 19 U/L (ref 0–37)
Albumin: 4 g/dL (ref 3.5–5.2)
Alkaline Phosphatase: 75 U/L (ref 39–117)
BILIRUBIN TOTAL: 0.4 mg/dL (ref 0.2–1.2)
BUN: 20 mg/dL (ref 6–23)
CALCIUM: 9.8 mg/dL (ref 8.4–10.5)
CHLORIDE: 102 meq/L (ref 96–112)
CO2: 31 meq/L (ref 19–32)
Creatinine, Ser: 0.86 mg/dL (ref 0.40–1.20)
GFR: 69.03 mL/min (ref >60.00)
GLUCOSE: 88 mg/dL (ref 70–99)
POTASSIUM: 4 meq/L (ref 3.5–5.1)
Sodium: 140 mEq/L (ref 135–145)
Total Protein: 6.7 g/dL (ref 6.0–8.3)

## 2015-10-01 LAB — VITAMIN D 25 HYDROXY (VIT D DEFICIENCY, FRACTURES): VITD: 100.53 ng/mL — ABNORMAL HIGH (ref 30.00–100.00)

## 2015-10-01 NOTE — Telephone Encounter (Signed)
Labs and dx?  

## 2015-10-08 ENCOUNTER — Ambulatory Visit: Payer: PPO | Admitting: Internal Medicine

## 2015-10-15 ENCOUNTER — Emergency Department
Admission: EM | Admit: 2015-10-15 | Discharge: 2015-10-15 | Disposition: A | Discharge status: Home / Self Care | Payer: PPO | Attending: Emergency Medicine | Admitting: Emergency Medicine

## 2015-10-15 ENCOUNTER — Encounter: Payer: Self-pay | Admitting: Internal Medicine

## 2015-10-15 ENCOUNTER — Encounter: Payer: Self-pay | Admitting: Emergency Medicine

## 2015-10-15 ENCOUNTER — Ambulatory Visit (INDEPENDENT_AMBULATORY_CARE_PROVIDER_SITE_OTHER): Payer: PPO | Admitting: Internal Medicine

## 2015-10-15 VITALS — BP 94/60 | HR 77 | Temp 97.5°F | Resp 12 | Ht 61.0 in | Wt 126.5 lb

## 2015-10-15 DIAGNOSIS — E673 Hypervitaminosis D: Secondary | ICD-10-CM

## 2015-10-15 DIAGNOSIS — Z79899 Other long term (current) drug therapy: Secondary | ICD-10-CM | POA: Insufficient documentation

## 2015-10-15 DIAGNOSIS — Z7982 Long term (current) use of aspirin: Secondary | ICD-10-CM | POA: Diagnosis not present

## 2015-10-15 DIAGNOSIS — R0902 Hypoxemia: Secondary | ICD-10-CM | POA: Insufficient documentation

## 2015-10-15 DIAGNOSIS — Z87891 Personal history of nicotine dependence: Secondary | ICD-10-CM | POA: Insufficient documentation

## 2015-10-15 DIAGNOSIS — R630 Anorexia: Secondary | ICD-10-CM | POA: Insufficient documentation

## 2015-10-15 DIAGNOSIS — R531 Weakness: Secondary | ICD-10-CM | POA: Insufficient documentation

## 2015-10-15 DIAGNOSIS — J9601 Acute respiratory failure with hypoxia: Secondary | ICD-10-CM

## 2015-10-15 DIAGNOSIS — R05 Cough: Secondary | ICD-10-CM

## 2015-10-15 DIAGNOSIS — R5383 Other fatigue: Secondary | ICD-10-CM | POA: Diagnosis not present

## 2015-10-15 DIAGNOSIS — R350 Frequency of micturition: Secondary | ICD-10-CM

## 2015-10-15 DIAGNOSIS — R0989 Other specified symptoms and signs involving the circulatory and respiratory systems: Secondary | ICD-10-CM | POA: Diagnosis not present

## 2015-10-15 DIAGNOSIS — R059 Cough, unspecified: Secondary | ICD-10-CM

## 2015-10-15 DIAGNOSIS — R4182 Altered mental status, unspecified: Secondary | ICD-10-CM

## 2015-10-15 LAB — BASIC METABOLIC PANEL
Anion gap: 6 (ref 5–15)
BUN: 25 mg/dL — AB (ref 6–20)
CO2: 28 mmol/L (ref 22–32)
CREATININE: 0.69 mg/dL (ref 0.44–1.00)
Calcium: 9.3 mg/dL (ref 8.9–10.3)
Chloride: 101 mmol/L (ref 101–111)
GFR calc Af Amer: >60 mL/min (ref >60)
GLUCOSE: 103 mg/dL — AB (ref 65–99)
POTASSIUM: 4.2 mmol/L (ref 3.5–5.1)
SODIUM: 135 mmol/L (ref 135–145)

## 2015-10-15 LAB — URINALYSIS COMPLETE WITH MICROSCOPIC (ARMC ONLY)
BACTERIA UA: NONE SEEN
BILIRUBIN URINE: NEGATIVE
GLUCOSE, UA: NEGATIVE mg/dL
Hgb urine dipstick: NEGATIVE
Ketones, ur: NEGATIVE mg/dL
Nitrite: NEGATIVE
Protein, ur: NEGATIVE mg/dL
Specific Gravity, Urine: 1.017 (ref 1.005–1.030)
pH: 5 (ref 5.0–8.0)

## 2015-10-15 LAB — CBC
HEMATOCRIT: 30.2 % — AB (ref 35.0–47.0)
Hemoglobin: 9.5 g/dL — ABNORMAL LOW (ref 12.0–16.0)
MCH: 26.8 pg (ref 26.0–34.0)
MCHC: 31.7 g/dL — AB (ref 32.0–36.0)
MCV: 84.7 fL (ref 80.0–100.0)
PLATELETS: 194 10*3/uL (ref 150–440)
RBC: 3.56 MIL/uL — ABNORMAL LOW (ref 3.80–5.20)
RDW: 14.8 % — AB (ref 11.5–14.5)
WBC: 5.4 10*3/uL (ref 3.6–11.0)

## 2015-10-15 LAB — TROPONIN I: Troponin I: <0.03 ng/mL (ref <0.031)

## 2015-10-15 NOTE — Progress Notes (Signed)
Subjective:  Patient ID: Meagan Zavala, female    DOB: [birth year]  Age: 70 y.o. MRN: [id number]  CC: The primary encounter diagnosis was Other fatigue. Diagnoses of Chest rales, Urinary frequency, Cough, Altered mental status, unspecified altered mental status type, Acute respiratory failure with hypoxia ([place]), and High vitamin D level were also pertinent to this visit.  HPI Meagan Zavala presents for follow up on multiple issues  iron deficiency, chronic since gastric bypass,  With anemia.  Cannot tolerate iron supplements orally due to constipaiton    patient not feeling well.  Seen in ED  Twice , first time for dizziness on Nov 22 for not feeling well x 5 weeks.  . Chest x ray showed pulomnary vascular congestions which was not addressed.  She was treated for UTI and given a bolus of IV fluids. And told to follow up with PCP, which she did not do    Seen again on Dec 1 for  No appetite, constipation,  Mucus in stool ,  Feeling too sick and weak ,  Drinking Boost 3-4 daily .  Bowels moving daily .    Spent this past weekend in bed. 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 Denies symptoms of depression but admits to feeling very stressed by freeloading nephew and niece who are constantly asking her for money.  Nephew' s son having behavioral problems and parents are on narcotics and drink.  She tried to call [employer] and they refused to investigate her. Now in a different school.   Has been coughing  a lot at night, having stress and urge incontinence. Drink Boost 3/daily  . Says she is drinking water.  Nocturia x 3   3) Supratherapeutic Vit D level,  Takes 50,000 Ius weekly,  Level was 86 in mid December per Dr Gennie Alma,  Was 100.3 last week when I checked it.   4) altered mental status: patient very lethargic today,  Denies any recent use of sedating medications,  During measurementof vital signs she was oted to drop to 81% with standing,    5) orthostatic BP drops from  [id number] to 90 systolic,  Pulse 71 to 77,  Pulse ox 94 to 81%    Patient is too unstable yo evaluate and treat as outpatient,  Calling EMS to tras[facility]port to [facility]   Outpatient Prescriptions Prior to Visit  Medication Sig Dispense Refill  . aspirin 81 MG tablet Take 1 tablet (81 mg total) by mouth daily. 30 tablet 11  . calcium citrate-vitamin D 200-200 MG-UNIT TABS Take 3 tablets by mouth daily.     . clonazePAM (KLONOPIN) 0.5 MG tablet 0.25 mg 2 (two) times daily as needed.   0  . docusate sodium (COLACE) 100 MG capsule Take 100 mg by mouth 2 (two) times daily.    Marland Kitchen donepezil (ARICEPT) 10 MG tablet Take 1 tablet (10 mg total) by mouth at bedtime. TAKE ONE TABLET BY MOUTH AT BEDTIME AS NEEDED 901 tablet 1  . fish oil-omega-3 fatty acids 1000 MG capsule Take 1 capsule by mouth daily.    Marland Kitchen FLUoxetine (PROZAC) 40 MG capsule Take 1 capsule (40 mg total) by mouth daily. 90 capsule 3  . FLUoxetine HCl 60 MG TABS Take 1 tablet by mouth daily. In winter 30 tablet 2  . HYDROcodone-acetaminophen (NORCO) 10-325 MG tablet Take 1 tablet by mouth 2 (two) times daily. DAILY AS NEEDED FOR SEVERE PAIN 60 tablet 0  . HYDROcodone-acetaminophen (NORCO) 10-325 MG tablet  Take 1 tablet by mouth 2 (two) times daily. As needed for severe pain  May refill on or after Sep 05 2015 60 tablet 0  . Multiple Vitamins-Minerals (ICAPS PO) Take by mouth.    Marland Kitchen omeprazole (PRILOSEC) 40 MG capsule TAKE ONE CAPSULE BY MOUTH ONCE DAILY 90 capsule 2  . oxybutynin (DITROPAN XL) 10 MG 24 hr tablet Take 1 tablet (10 mg total) by mouth at bedtime. 30 tablet 5  . polyethylene glycol (MIRALAX) packet Take 17 g by mouth daily. 14 each 0  . pravastatin (PRAVACHOL) 20 MG tablet TAKE ONE TABLET BY MOUTH ONCE DAILY 90 tablet 0  . solifenacin (VESICARE) 5 MG tablet Take 1 tablet (5 mg total) by mouth daily. 90 tablet 0  . traZODone (DESYREL) 50 MG tablet TAKE ONE TO TWO TABLETS BY MOUTH ONCE DAILY 180 tablet 0  . traZODone (DESYREL) 50 MG tablet TAKE  ONE TO TWO TABLETS BY MOUTH ONCE DAILY 180 tablet 0  . vitamin E 400 UNIT capsule Take 400 Units by mouth daily.    . zoledronic acid (RECLAST) 5 MG/100ML SOLN Every year.    . meloxicam (MOBIC) 7.5 MG tablet Take 1 tablet (7.5 mg total) by mouth daily. As needed for pain 90 tablet 1  . ferrous fumarate (HEMOCYTE - 106 MG FE) 325 (106 FE) MG TABS tablet Take 1 tablet by mouth daily. Reported on 10/15/2015    . Vitamin D, Ergocalciferol, (DRISDOL) 50000 UNITS CAPS Take 50,000 Units by mouth every 7 (seven) days. Reported on 10/15/2015    . azithromycin (AZASITE) 1 % ophthalmic solution Place 1 drop into both eyes 2 (two) times daily. 2.5 mL 2  . bacitracin ophthalmic ointment Use on sutures 4 times a day for 12-14 days 3.5 g 3  . cephALEXin (KEFLEX) 500 MG capsule Take 1 capsule (500 mg total) by mouth 3 (three) times daily. 30 capsule 0  . oxyCODONE-acetaminophen (PERCOCET) 5-325 MG tablet Take 1 tablet by mouth every 4 (four) hours as needed for severe pain. (Patient not taking: Reported on 10/15/2015) 6 tablet 0  . phenazopyridine (PYRIDIUM) 200 MG tablet Take 1 tablet (200 mg total) by mouth 3 (three) times daily as needed for pain. (Patient not taking: Reported on 10/15/2015) 10 tablet 0   No facility-administered medications prior to visit.    Review of Systems;  Patient denies headache, fevers, malaise, unintentional weight loss, skin rash, eye pain, sinus congestion and sinus pain, sore throat, dysphagia,  hemoptysis , cough, dyspnea, wheezing, chest pain, palpitations, orthopnea, edema, abdominal pain, nausea, melena, diarrhea, constipation, flank pain, dysuria, hematuria, urinary  Frequency, nocturia, numbness, tingling, seizures,  Focal weakness, Loss of consciousness,  Tremor, insomnia, depression, anxiety, and suicidal ideation.      Objective:  BP 94/60 mmHg  Pulse 77  Temp(Src) 97.5 F (36.4 C) (Oral)  Resp 12  Ht 5\' 1"  (1.549 m)  Wt 126 lb 8 oz (57.38 kg)  BMI 23.91 kg/m2   SpO2 94%  BP Readings from Last 3 Encounters:  10/15/15 118/64  10/15/15 94/60  08/20/15 116/67    Wt Readings from Last 3 Encounters:  10/15/15 122 lb (55.339 kg)  10/15/15 126 lb 8 oz (57.38 kg)  08/20/15 122 lb (55.339 kg)    General appearance: alert, cooperative and appears stated age Ears: normal TM's and external ear canals both ears Throat: lips, mucosa, and tongue normal; teeth and gums normal Neck: no adenopathy, no carotid bruit, supple, symmetrical, trachea midline and thyroid not  enlarged, symmetric, no tenderness/mass/nodules Back: symmetric, no curvature. ROM normal. No CVA tenderness. Lungs: clear to auscultation bilaterally Heart: regular rate and rhythm, S1, S2 normal, no murmur, click, rub or gallop Abdomen: soft, non-tender; bowel sounds normal; no masses,  no organomegaly Pulses: 2+ and symmetric Skin: Skin color, texture, turgor normal. No rashes or lesions Lymph nodes: Cervical, supraclavicular, and axillary nodes normal.  No results found for: HGBA1C  Lab Results  Component Value Date   CREATININE 0.69 10/15/2015   CREATININE 0.86 10/01/2015   CREATININE 0.84 08/20/2015    Lab Results  Component Value Date   WBC 5.4 10/15/2015   HGB 9.5* 10/15/2015   HCT 30.2* 10/15/2015   PLT 194 10/15/2015   GLUCOSE 103* 10/15/2015   CHOL 141 11/09/2013   TRIG 40.0 11/09/2013   HDL 68.20 11/09/2013   LDLCALC 65 11/09/2013   ALT 18 10/01/2015   AST 19 10/01/2015   NA 135 10/15/2015   K 4.2 10/15/2015   CL 101 10/15/2015   CREATININE 0.69 10/15/2015   BUN 25* 10/15/2015   CO2 28 10/15/2015   TSH 3.25 11/09/2013    Dg Chest 1 View  08/20/2015  CLINICAL DATA:  Dizziness, weakness, cough and congestion. EXAM: CHEST 1 VIEW COMPARISON:  10/06/2012 FINDINGS: Normal heart size. There is no pleural effusion. There is no airspace consolidation identified. Mild coarsened interstitial markings are identified in both lungs. Findings may reflect pulmonary vascular  congestion. IMPRESSION: Pulmonary vascular congestion.  No overt failure however. Electronically Signed   By: Kerby Moors M.D.   On: 08/20/2015 18:28    Assessment & Plan:   Problem List Items Addressed This Visit    Altered mental status    She is lethargic today and unsteady on her feet ,  And her pulse is erratic and pulse ox keeps dropping.  Sending her to ER for further evaluation.  Suspect thalamic CVA       Acute respiratory failure with hypoxia (HCC)    She is not short of breath but her pulse ox drops to 81 % with position change and she has rales in the RLL.  Sending to ER for further evaluation.        High vitamin D level    Advised to stop Vit D supplements, or at least reduce dose to every other week.        Other Visit Diagnoses    Other fatigue    -  Primary    Relevant Orders    CBC with Differential/Platelet    B Nat Peptide    Chest rales        Relevant Orders    B Nat Peptide    DG Chest 2 View    Urinary frequency        Relevant Orders    POCT urinalysis dipstick    Urinalysis, Routine w reflex microscopic    Urine culture    Cough        Relevant Orders    DG Chest 2 View       I have discontinued Ms. Wilborn's phenazopyridine, azithromycin, bacitracin, oxyCODONE-acetaminophen, and cephALEXin. I am also having her maintain her zoledronic acid, Vitamin D (Ergocalciferol), calcium citrate-vitamin D, Multiple Vitamins-Minerals (ICAPS PO), fish oil-omega-3 fatty acids, vitamin E, donepezil, aspirin, clonazePAM, FLUoxetine HCl, FLUoxetine, solifenacin, ferrous fumarate, oxybutynin, traZODone, HYDROcodone-acetaminophen, HYDROcodone-acetaminophen, docusate sodium, pravastatin, traZODone, polyethylene glycol, and omeprazole.  No orders of the defined types were placed in this encounter.    Medications Discontinued  During This Encounter  Medication Reason  . azithromycin (AZASITE) 1 % ophthalmic solution Completed Course  . bacitracin ophthalmic  ointment Completed Course  . cephALEXin (KEFLEX) 500 MG capsule Completed Course  . oxyCODONE-acetaminophen (PERCOCET) 5-325 MG tablet   . phenazopyridine (PYRIDIUM) 200 MG tablet     Follow-up: No Follow-up on file.   Crecencio Mc, MD

## 2015-10-15 NOTE — ED Notes (Signed)
Patient states she has not been feeling well since the end of October.  Reports several recent personal stressors.  Patient states she feels totally exhausted.  Seen by PCP today who sent her to ED for evaluation for orthostatic blood pressure and low oxygen saturations when standing.

## 2015-10-15 NOTE — ED Provider Notes (Signed)
[facility] Emergency Department Provider Note    ____________________________________________  Time seen: 2115  I have reviewed the triage vital signs and the nursing notes.   HISTORY  Chief Complaint Weakness and Fatigue   History limited by: Not Limited   HPI Meagan Zavala is a 70 y.o. female who presents to the emergency department today at the request of her doctor because of concern for low oxegyn saturation and persistent fatigue. The patient states that she has been having fatigue for a number of months. She does state that she has had multiple life stressors including death of a close family friend, family that is a financial burden. States that she sleeps most of the day. Appetite has decreased. Has not seen a therapist or psychiatrist/psychologist. She denies any chest pain or shortness of breath.   At the doctors office they state that her oxygen level fell into the 80s upon standing.     Past Medical History  Diagnosis Date  . BULIMIA many decades of this    patient keeps alive by drinking ensure that she cannot purge  . Major depressive disorder, recurrent episode, severe ([place])     well treated with SSRI's  . KIDNEY DISEASE, CHRONIC, STAGE III 2010    Cr 1.3 -> .84, probably from NSAID's and lasix  . LOW BACK PAIN, CHRONIC     takes tylenol  . Basal cell carcinoma of face   . LEAKAGE, CONTINUOUS URINE 07/30/2009  . Macular degeneration disease     [place], now [place]  . OSTEOPOROSIS     severe, treated with infusions, followed by endocrine in [place]  . GERD (gastroesophageal reflux disease)   . Anemia   . Arthritis   . Hypercholesteremia   . Wears dentures     full upper and lower    Patient Active Problem List   Diagnosis Date Noted  . Blepharitis of left eye 07/05/2015  . Fatty liver disease, nonalcoholic [id number]  . Other and unspecified hyperlipidemia 09/01/2013  . Unspecified vitamin D deficiency  09/01/2013  . Chronic cough 09/29/2012  . Medicare annual wellness visit, subsequent 09/16/2012  . Polyarthritis 04/16/2012  . Macular degeneration disease   . Neck pain 03/03/2011  . Insomnia 11/25/2010  . MCI (mild cognitive impairment) with memory loss 11/18/2010  . BULIMIA 07/30/2009  . Major depressive disorder, recurrent episode, moderate ([place]) 07/30/2009  . GERD 07/30/2009  . KIDNEY DISEASE, CHRONIC, STAGE III 07/30/2009  . LOW BACK PAIN, CHRONIC 07/30/2009  . Osteoporosis 07/30/2009  . Urinary incontinence 07/30/2009    Past Surgical History  Procedure Laterality Date  . 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 Surgical excision of excessive skin  1979  . Colon surgery  1980    partial colectomy  . Carpal tunnel release    . Tubal ligation    . Cataract extraction    . Ptosis repair Bilateral 08/13/2015    Procedure: PTOSIS REPAIR;  Surgeon: Karle Starch, MD;  Location: [facility];  Service: Ophthalmology;  Laterality: Bilateral;  . Ectropion repair Bilateral 08/13/2015    Procedure: REPAIR OF ECTROPION;  Surgeon: Karle Starch, MD;  Location: [facility];  Service: Ophthalmology;  Laterality: Bilateral;    Current Outpatient Rx  Name  Route  Sig  Dispense  Refill  . aspirin 81 MG tablet   Oral   Take 1 tablet (81 mg total) by mouth daily.   30 tablet  11   . calcium citrate-vitamin D 200-200 MG-UNIT TABS   Oral   Take 3 tablets by mouth daily.          . clonazePAM (KLONOPIN) 0.5 MG tablet      0.25 mg 2 (two) times daily as needed.       0   . docusate sodium (COLACE) 100 MG capsule   Oral   Take 100 mg by mouth 2 (two) times daily.         Marland Kitchen donepezil (ARICEPT) 10 MG tablet   Oral   Take 1 tablet (10 mg total) by mouth at bedtime. TAKE ONE TABLET BY MOUTH AT BEDTIME AS NEEDED   901 tablet   1     Keep on file for future refills   . ferrous fumarate (HEMOCYTE - 106 MG FE) 325 (106 FE) MG TABS tablet    Oral   Take 1 tablet by mouth daily. Reported on 10/15/2015         . fish oil-omega-3 fatty acids 1000 MG capsule   Oral   Take 1 capsule by mouth daily.         Marland Kitchen FLUoxetine (PROZAC) 40 MG capsule   Oral   Take 1 capsule (40 mg total) by mouth daily.   90 capsule   3     Starting in May.  Replaces 60 mg dose   . FLUoxetine HCl 60 MG TABS   Oral   Take 1 tablet by mouth daily. In winter   30 tablet   2   . HYDROcodone-acetaminophen (NORCO) 10-325 MG tablet   Oral   Take 1 tablet by mouth 2 (two) times daily. DAILY AS NEEDED FOR SEVERE PAIN   60 tablet   0     May refill on or after August 06 2015   . HYDROcodone-acetaminophen (NORCO) 10-325 MG tablet   Oral   Take 1 tablet by mouth 2 (two) times daily. As needed for severe pain  May refill on or after Sep 05 2015   60 tablet   0   . meloxicam (MOBIC) 7.5 MG tablet   Oral   Take 1 tablet (7.5 mg total) by mouth daily. As needed for pain   90 tablet   1   . Multiple Vitamins-Minerals (ICAPS PO)   Oral   Take by mouth.         Marland Kitchen omeprazole (PRILOSEC) 40 MG capsule      TAKE ONE CAPSULE BY MOUTH ONCE DAILY   90 capsule   2   . oxybutynin (DITROPAN XL) 10 MG 24 hr tablet   Oral   Take 1 tablet (10 mg total) by mouth at bedtime.   30 tablet   5   . polyethylene glycol (MIRALAX) packet   Oral   Take 17 g by mouth daily.   14 each   0   . pravastatin (PRAVACHOL) 20 MG tablet      TAKE ONE TABLET BY MOUTH ONCE DAILY   90 tablet   0   . solifenacin (VESICARE) 5 MG tablet   Oral   Take 1 tablet (5 mg total) by mouth daily.   90 tablet   0   . traZODone (DESYREL) 50 MG tablet      TAKE ONE TO TWO TABLETS BY MOUTH ONCE DAILY   180 tablet   0   . traZODone (DESYREL) 50 MG tablet      TAKE ONE TO TWO TABLETS  BY MOUTH ONCE DAILY   180 tablet   0   . Vitamin D, Ergocalciferol, (DRISDOL) 50000 UNITS CAPS   Oral   Take 50,000 Units by mouth every 7 (seven) days. Reported on 10/15/2015          . vitamin E 400 UNIT capsule   Oral   Take 400 Units by mouth daily.         . zoledronic acid (RECLAST) 5 MG/100ML SOLN      Every year.           Allergies Review of patient's allergies indicates no known allergies.  Family History  Problem Relation Age of Onset  . Cancer Mother     breast,   . Cancer Father     lung  . Cancer Sister     lung, brain  . Cancer Brother     throat    Social History Social History  Substance Use Topics  . Smoking status: Former Smoker    Quit date: 10/02/2010  . Smokeless tobacco: Never Used  . 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 Cardiovascular: Negative for chest pain. Respiratory: Negative for shortness of breath. Gastrointestinal: Negative for abdominal pain, vomiting and diarrhea. Neurological: Negative for headaches, focal weakness or numbness.  10-point ROS otherwise negative.  ____________________________________________   PHYSICAL EXAM:  VITAL SIGNS: ED Triage Vitals  Enc Vitals Group     BP 10/15/15 1637 117/60 mmHg     Pulse Rate 10/15/15 1637 63     Resp 10/15/15 1637 18     Temp 10/15/15 1637 97.6 F (36.4 C)     Temp Source 10/15/15 1637 Oral     SpO2 10/15/15 1637 100 %     Weight 10/15/15 1637 122 lb (55.339 kg)     Height 10/15/15 1637 5\' 1"  (1.549 m)     Head Cir --      Peak Flow --      Pain Score 10/15/15 1639 0   Constitutional: Alert and oriented. Well appearing and in no distress. Eyes: Conjunctivae are normal. PERRL. Normal extraocular movements. ENT   Head: Normocephalic and atraumatic.   Nose: No congestion/rhinnorhea.   Mouth/Throat: Mucous membranes are moist.   Neck: No stridor. Hematological/Lymphatic/Immunilogical: No cervical lymphadenopathy. Cardiovascular: Normal rate, regular rhythm.  No murmurs, rubs, or gallops. Respiratory: Normal respiratory effort without  tachypnea nor retractions. Breath sounds are clear and equal bilaterally. No wheezes/rales/rhonchi. Gastrointestinal: Soft and nontender. No distention. There is no CVA tenderness. Genitourinary: Deferred Musculoskeletal: Normal range of motion in all extremities. No joint effusions.  No lower extremity tenderness nor edema. Neurologic:  Normal speech and language. No gross focal neurologic deficits are appreciated.  Skin:  Skin is warm, dry and intact. No rash noted. Psychiatric: Mood and affect are normal. Speech and behavior are normal. Patient exhibits appropriate insight and judgment.  ____________________________________________    LABS (pertinent positives/negatives)  Labs Reviewed  BASIC METABOLIC PANEL - Abnormal; Notable for the following:    Glucose, Bld 103 (*)    BUN 25 (*)    All other components within normal limits  CBC - Abnormal; Notable for the following:    RBC 3.56 (*)    Hemoglobin 9.5 (*)    HCT 30.2 (*)    MCHC 31.7 (*)    RDW 14.8 (*)    All other components within normal  limits  URINALYSIS COMPLETEWITH MICROSCOPIC ([facility] ONLY) - Abnormal; Notable for the following:    Color, Urine YELLOW (*)    APPearance CLEAR (*)    Leukocytes, UA TRACE (*)    Squamous Epithelial / LPF 0-5 (*)    All other components within normal limits  TROPONIN I     ____________________________________________   EKG  I, Nance Pear, attending physician, personally viewed and interpreted this EKG  EKG Time: 1706 Rate: 61 Rhythm: Normal sinus rhythm Axis: normal Intervals: qtc 457 QRS: low voltage ST changes: no st elevation Impression: abnormal ekg   ____________________________________________    RADIOLOGY  None   ____________________________________________   PROCEDURES  Procedure(s) performed: None  Critical Care performed: No  ____________________________________________   INITIAL IMPRESSION / ASSESSMENT AND PLAN / ED COURSE  Pertinent  labs & imaging results that were available during my care of the patient were reviewed by me and considered in my medical decision making (see chart for details).  Patient presented to the emergency department today from primary care doctor's office because of concerns for fatigue and hypoxia. Patient was not hypoxic here in the emergency department. Additionally patient ambulated around the emergency department with good oxygen saturation. Furthermore patient was not orthostatic here, she only had a very mild drop in blood pressure. On physical exam patient without any concerning findings. I did have a long discussion with the patient it sounds like the patient is under multiple stressors at home. Certainly her increased sleep and decreased appetite would be consistent with some depression. I discussed with the patient points of following up with primary care and did suggest the patient could try a therapist.  ____________________________________________   FINAL CLINICAL IMPRESSION(S) / ED DIAGNOSES  Final diagnoses:  Other fatigue     Nance Pear, MD 10/16/15 [phone]

## 2015-10-15 NOTE — Discharge Instructions (Signed)
Please seek medical attention for any high fevers, chest pain, shortness of breath, change in behavior, persistent vomiting, bloody stool or any other new or concerning symptoms.   Fatigue Fatigue is feeling tired all of the time, a lack of energy, or a lack of motivation. Occasional or mild fatigue is often a normal response to activity or life in general. However, long-lasting (chronic) or extreme fatigue may indicate an underlying medical condition. HOME CARE INSTRUCTIONS  Watch your fatigue for any changes. The following actions may help to lessen any discomfort you are feeling:  Talk to your health care provider about how much sleep you need each night. Try to get the required amount every night.  Take medicines only as directed by your health care provider.  Eat a healthy and nutritious diet. Ask your health care provider if you need help changing your diet.  Drink enough fluid to keep your urine clear or pale yellow.  Practice ways of relaxing, such as yoga, meditation, massage therapy, or acupuncture.  Exercise regularly.   Change situations that cause you stress. Try to keep your work and personal routine reasonable.  Do not abuse illegal drugs.  Limit alcohol intake to no more than 1 drink per day for nonpregnant women and 2 drinks per day for men. One drink equals 12 ounces of beer, 5 ounces of wine, or 1 ounces of hard liquor.  Take a multivitamin, if directed by your health care provider. SEEK MEDICAL CARE IF:   Your fatigue does not get better.  You have a fever.   You have unintentional weight loss or gain.  You have headaches.   You have difficulty:   Falling asleep.  Sleeping throughout the night.  You feel angry, guilty, anxious, or sad.   You are unable to have a bowel movement (constipation).   You skin is dry.   Your legs or another part of your body is swollen.  SEEK IMMEDIATE MEDICAL CARE IF:   You feel confused.   Your vision  is blurry.  You feel faint or pass out.   You have a severe headache.   You have severe abdominal, pelvic, or back pain.   You have chest pain, shortness of breath, or an irregular or fast heartbeat.   You are unable to urinate or you urinate less than normal.   You develop abnormal bleeding, such as bleeding from the rectum, vagina, nose, lungs, or nipples.  You vomit blood.   You have thoughts about harming yourself or committing suicide.   You are worried that you might harm someone else.    This information is not intended to replace advice given to you by your health care provider. Make sure you discuss any questions you have with your health care provider.   Document Released: 07/12/2007 Document Revised: 10/05/2014 Document Reviewed: 01/16/2014 Elsevier Interactive Patient Education 2016 [place] and Stress Management Stress is a normal reaction to life events. It is what you feel when life demands more than you are used to or more than you can handle. Some stress can be useful. For example, the stress reaction can help you catch the last bus of the day, study for a test, or meet a deadline at work. But stress that occurs too often or for too long can cause problems. It can affect your emotional health and interfere with relationships and normal daily activities. Too much stress can weaken your immune system and increase your risk for physical illness. If  you already have a medical problem, stress can make it worse. CAUSES  All sorts of life events may cause stress. An event that causes stress for one person may not be stressful for another person. Major life events commonly cause stress. These may be positive or negative. Examples include losing your job, moving into a new home, getting married, having a baby, or losing a loved one. Less obvious life events may also cause stress, especially if they occur day after day or in combination. Examples include  working long hours, driving in traffic, caring for children, being in debt, or being in a difficult relationship. SIGNS AND SYMPTOMS Stress may cause emotional symptoms including, the following:  Anxiety. This is feeling worried, afraid, on edge, overwhelmed, or out of control.  Anger. This is feeling irritated or impatient.  Depression. This is feeling sad, down, helpless, or guilty.  Difficulty focusing, remembering, or making decisions. Stress may cause physical symptoms, including the following:   Aches and pains. These may affect your head, neck, back, stomach, or other areas of your body.  Tight muscles or clenched jaw.  Low energy or trouble sleeping. Stress may cause unhealthy behaviors, including the following:   Eating to feel better (overeating) or skipping meals.  Sleeping too little, too much, or both.  Working too much or putting off tasks (procrastination).  Smoking, drinking alcohol, or using drugs to feel better. DIAGNOSIS  Stress is diagnosed through an assessment by your health care provider. Your health care provider will ask questions about your symptoms and any stressful life events.Your health care provider will also ask about your medical history and may order blood tests or other tests. Certain medical conditions and medicine can cause physical symptoms similar to stress. Mental illness can cause emotional symptoms and unhealthy behaviors similar to stress. Your health care provider may refer you to a mental health professional for further evaluation.  TREATMENT  Stress management is the recommended treatment for stress.The goals of stress management are reducing stressful life events and coping with stress in healthy ways.  Techniques for reducing stressful life events include the following:  Stress identification. Self-monitor for stress and identify what causes stress for you. These skills may help you to avoid some stressful events.  Time  management. Set your priorities, keep a calendar of events, and learn to say "no." These tools can help you avoid making too many commitments. Techniques for coping with stress include the following:  Rethinking the problem. Try to think realistically about stressful events rather than ignoring them or overreacting. Try to find the positives in a stressful situation rather than focusing on the negatives.  Exercise. Physical exercise can release both physical and emotional tension. The key is to find a form of exercise you enjoy and do it regularly.  Relaxation techniques. These relax the body and mind. Examples include yoga, meditation, tai chi, biofeedback, deep breathing, progressive muscle relaxation, listening to music, being out in nature, journaling, and other hobbies. Again, the key is to find one or more that you enjoy and can do regularly.  Healthy lifestyle. Eat a balanced diet, get plenty of sleep, and do not smoke. Avoid using alcohol or drugs to relax.  Strong support network. Spend time with family, friends, or other people you enjoy being around.Express your feelings and talk things over with someone you trust. Counseling or talktherapy with a mental health professional may be helpful if you are having difficulty managing stress on your own. Medicine is typically not  recommended for the treatment of stress.Talk to your health care provider if you think you need medicine for symptoms of stress. HOME CARE INSTRUCTIONS  Keep all follow-up visits as directed by your health care provider.  Take all medicines as directed by your health care provider. SEEK MEDICAL CARE IF:  Your symptoms get worse or you start having new symptoms.  You feel overwhelmed by your problems and can no longer manage them on your own. SEEK IMMEDIATE MEDICAL CARE IF:  You feel like hurting yourself or someone else.   This information is not intended to replace advice given to you by your health care  provider. Make sure you discuss any questions you have with your health care provider.   Document Released: 03/10/2001 Document Revised: 10/05/2014 Document Reviewed: 05/09/2013 Elsevier Interactive Patient Education [employer].

## 2015-10-15 NOTE — Patient Instructions (Signed)
Please reduce your dose of vitamin d to 50,000 IU's  every two weeks   Your iron stores are low because you do not absorb iron..  I can set you up for iron infusions through the [facility]  I need you to get a chest x ray again so I can determine if you have a pneumonia or fluid in your lungs

## 2015-10-15 NOTE — Progress Notes (Signed)
Pre-visit discussion using our clinic review tool. No additional management support is needed unless otherwise documented below in the visit note.  

## 2015-10-17 ENCOUNTER — Other Ambulatory Visit: Payer: Self-pay | Admitting: Internal Medicine

## 2015-10-17 DIAGNOSIS — J9601 Acute respiratory failure with hypoxia: Secondary | ICD-10-CM | POA: Insufficient documentation

## 2015-10-17 DIAGNOSIS — E673 Hypervitaminosis D: Secondary | ICD-10-CM | POA: Insufficient documentation

## 2015-10-17 DIAGNOSIS — R4182 Altered mental status, unspecified: Secondary | ICD-10-CM | POA: Insufficient documentation

## 2015-10-17 NOTE — Assessment & Plan Note (Signed)
She is lethargic today and unsteady on her feet ,  And her pulse is erratic and pulse ox keeps dropping.  Sending her to ER for further evaluation.  Suspect thalamic CVA

## 2015-10-17 NOTE — Assessment & Plan Note (Signed)
Advised to stop Vit D supplements, or at least reduce dose to every other week.

## 2015-10-17 NOTE — Assessment & Plan Note (Signed)
She is not short of breath but her pulse ox drops to 81 % with position change and she has rales in the RLL.  Sending to ER for further evaluation.

## 2015-11-19 ENCOUNTER — Other Ambulatory Visit: Payer: Self-pay | Admitting: Internal Medicine

## 2015-11-20 ENCOUNTER — Other Ambulatory Visit: Payer: Self-pay

## 2015-11-20 MED ORDER — OXYBUTYNIN CHLORIDE ER 10 MG PO TB24: 10.0000 mg | ORAL_TABLET | Freq: Every day | ORAL | Status: DC

## 2015-11-21 ENCOUNTER — Telehealth: Payer: Self-pay | Admitting: Internal Medicine

## 2015-11-21 MED ORDER — FLUOXETINE HCL 60 MG PO TABS: ORAL_TABLET | ORAL | Status: DC

## 2015-11-21 NOTE — Telephone Encounter (Signed)
Pt called about needing a 90 day supply for FLUoxetine HCl 60 MG TABS. Pharmacy is [id number] - [place] [address] [place]. Call pt @ [phone]. Thank you!

## 2015-11-21 NOTE — Telephone Encounter (Signed)
Changed and sent

## 2015-11-21 NOTE — Telephone Encounter (Signed)
Pt is requesting a refill on fluoxetine HCI 60mg  tabs with a 90 day supply. Last OV 10/15/15, last filled 11/19/15 #30tabs with 1 refill. Please advise okay to change, thanks

## 2015-11-21 NOTE — Telephone Encounter (Signed)
Please advise, thanks.

## 2015-11-25 ENCOUNTER — Encounter: Payer: Self-pay | Admitting: Internal Medicine

## 2015-11-25 ENCOUNTER — Ambulatory Visit (INDEPENDENT_AMBULATORY_CARE_PROVIDER_SITE_OTHER): Payer: PPO | Admitting: Internal Medicine

## 2015-11-25 VITALS — BP 98/60 | HR 60 | Temp 97.5°F | Resp 12 | Ht 61.0 in | Wt 125.5 lb

## 2015-11-25 DIAGNOSIS — F331 Major depressive disorder, recurrent, moderate: Secondary | ICD-10-CM

## 2015-11-25 DIAGNOSIS — G8929 Other chronic pain: Secondary | ICD-10-CM

## 2015-11-25 DIAGNOSIS — M5442 Lumbago with sciatica, left side: Secondary | ICD-10-CM

## 2015-11-25 DIAGNOSIS — D509 Iron deficiency anemia, unspecified: Secondary | ICD-10-CM | POA: Diagnosis not present

## 2015-11-25 DIAGNOSIS — M5441 Lumbago with sciatica, right side: Secondary | ICD-10-CM

## 2015-11-25 DIAGNOSIS — R251 Tremor, unspecified: Secondary | ICD-10-CM

## 2015-11-25 DIAGNOSIS — G3184 Mild cognitive impairment, so stated: Secondary | ICD-10-CM

## 2015-11-25 DIAGNOSIS — D638 Anemia in other chronic diseases classified elsewhere: Secondary | ICD-10-CM

## 2015-11-25 DIAGNOSIS — E673 Hypervitaminosis D: Secondary | ICD-10-CM

## 2015-11-25 MED ORDER — HYDROCODONE-ACETAMINOPHEN 10-325 MG PO TABS: 1.0000 | ORAL_TABLET | Freq: Two times a day (BID) | ORAL | Status: DC

## 2015-11-25 MED ORDER — MIRTAZAPINE 15 MG PO TABS: 15.0000 mg | ORAL_TABLET | Freq: Every day | ORAL | Status: AC

## 2015-11-25 NOTE — Patient Instructions (Signed)
You are depressed and anxious  Your dementia is not progressing.  Your anxiety is affecting your memory  I am starting you on remeron to help with your depression and lack of appetite  Start with  7.5 mg ( 1/2 tablet)  at bedtime for the first 3 or 4 days,  Then increase to 15 mg (full tablet)  daily at bedtime thereafter  Return in 2 weeks

## 2015-11-25 NOTE — Progress Notes (Signed)
Subjective:  Patient ID: Meagan Zavala, female    DOB: [birth year]  Age: 70 y.o. MRN: [id number]  CC: The primary encounter diagnosis was Tremulousness. Diagnoses of Anemia, iron deficiency, MCI (mild cognitive impairment) with memory loss, Major depressive disorder, recurrent episode, moderate (HCC), Chronic midline low back pain with bilateral sciatica, Anemia of chronic disease, and High vitamin D level were also pertinent to this visit.  HPI Meagan Zavala presents for follow up on ER visit for AMS.  Patient was sent from offic to the ER fro mental statu changes concerning fr thalamic CVA.  She was evaluated briefly, given Iv fluids and sent home multiple symptoms   She  States there her "Nerves are out of control. She has been suffering from insomnia, because she states she is worried about losing her memory  To the point that she will not be abel to care for herself .  She has been unable to eat anything    solid . And is drinking 3-4 Boosts daily,  No appetite,  Denies dysphagia.  Feels shaky all the time.   Anxiety is aggravated by the death of her best friend who died on Christmas day. Has not been avoiding social contact,   just working 3 days outside of her home     Crying in office today .  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 aspirin 81 MG tablet Take 1 tablet (81 mg total) by mouth daily. 30 tablet 11  . calcium citrate-vitamin D 200-200 MG-UNIT TABS Take 3 tablets by mouth daily.     . clonazePAM (KLONOPIN) 0.5 MG tablet 0.25 mg 2 (two) times daily as needed.   0  . docusate sodium (COLACE) 100 MG capsule Take 100 mg by mouth 2 (two) times daily.    Marland Kitchen donepezil (ARICEPT) 10 MG tablet Take 1 tablet (10 mg total) by mouth at bedtime. TAKE ONE TABLET BY MOUTH AT BEDTIME AS NEEDED 901 tablet 1  . ferrous fumarate  (HEMOCYTE - 106 MG FE) 325 (106 FE) MG TABS tablet Take 1 tablet by mouth daily. Reported on 10/15/2015    . fish oil-omega-3 fatty acids 1000 MG capsule Take 1 capsule by mouth daily.    Marland Kitchen FLUoxetine HCl 60 MG TABS TAKE ONE TABLET BY MOUTH ONCE DAILY 90 tablet 1  . HYDROcodone-acetaminophen (NORCO) 10-325 MG tablet Take 1 tablet by mouth 2 (two) times daily. As needed for severe pain  May refill on or after Sep 05 2015 60 tablet 0  . meloxicam (MOBIC) 7.5 MG tablet TAKE ONE TABLET BY MOUTH ONCE DAILY AS NEEDED FOR PAIN 90 tablet 0  . Multiple Vitamins-Minerals (ICAPS PO) Take by mouth.    Marland Kitchen omeprazole (PRILOSEC) 40 MG capsule TAKE ONE CAPSULE BY MOUTH ONCE DAILY 90 capsule 2  . oxybutynin (DITROPAN XL) 10 MG 24 hr tablet Take 1 tablet (10 mg total) by mouth at bedtime. 30 tablet 5  . polyethylene glycol (MIRALAX) packet Take 17 g by mouth daily. 14 each 0  . pravastatin (PRAVACHOL) 20 MG tablet TAKE ONE TABLET BY MOUTH ONCE DAILY 90 tablet 3  . solifenacin (VESICARE) 5 MG tablet Take 1 tablet (5 mg total) by mouth daily. 90 tablet 0  . traZODone (DESYREL) 50 MG tablet TAKE ONE TO TWO TABLETS  BY MOUTH ONCE DAILY 180 tablet 0  . traZODone (DESYREL) 50 MG tablet TAKE ONE TO TWO TABLETS BY MOUTH ONCE DAILY 180 tablet 0  . Vitamin D, Ergocalciferol, (DRISDOL) 50000 UNITS CAPS Take 50,000 Units by mouth every 7 (seven) days. Reported on 11/25/2015    . vitamin E 400 UNIT capsule Take 400 Units by mouth daily.    . zoledronic acid (RECLAST) 5 MG/100ML SOLN Every year.    Marland Kitchen HYDROcodone-acetaminophen (NORCO) 10-325 MG tablet Take 1 tablet by mouth 2 (two) times daily. DAILY AS NEEDED FOR SEVERE PAIN 60 tablet 0   No facility-administered medications prior to visit.    Review of Systems;  Patient denies headache, fevers, malaise, unintentional weight loss, skin rash, eye pain, sinus congestion and sinus pain, sore throat, dysphagia,  hemoptysis , cough, dyspnea, wheezing, chest pain, palpitations,  orthopnea, edema, abdominal pain, nausea, melena, diarrhea, constipation, flank pain, dysuria, hematuria, urinary  Frequency, nocturia, numbness, tingling, seizures,  Focal weakness, Loss of consciousness,  and suicidal ideation.      Objective:  BP 98/60 mmHg  Pulse 60  Temp(Src) 97.5 F (36.4 C) (Oral)  Resp 12  Ht 5\' 1"  (1.549 m)  Wt 125 lb 8 oz (56.926 kg)  BMI 23.73 kg/m2  SpO2 98%  BP Readings from Last 3 Encounters:  11/25/15 98/60  10/15/15 118/64  10/15/15 94/60    Wt Readings from Last 3 Encounters:  11/25/15 125 lb 8 oz (56.926 kg)  10/15/15 122 lb (55.339 kg)  10/15/15 126 lb 8 oz (57.38 kg)    General appearance: alert, cooperative and appears stated age Ears: normal TM's and external ear canals both ears Throat: lips, mucosa, and tongue normal; teeth and gums normal Neck: no adenopathy, no carotid bruit, supple, symmetrical, trachea midline and thyroid not enlarged, symmetric, no tenderness/mass/nodules Back: symmetric, no curvature. ROM normal. No CVA tenderness. Lungs: clear to auscultation bilaterally Heart: regular rate and rhythm, S1, S2 normal, no murmur, click, rub or gallop Abdomen: soft, non-tender; bowel sounds normal; no masses,  no organomegaly Pulses: 2+ and symmetric Skin: Skin color, texture, turgor normal. No rashes or lesions Lymph nodes: Cervical, supraclavicular, and axillary nodes normal.  No results found for: HGBA1C  Lab Results  Component Value Date   CREATININE 0.69 10/15/2015   CREATININE 0.86 10/01/2015   CREATININE 0.84 08/20/2015    Lab Results  Component Value Date   WBC 5.5 11/25/2015   HGB 10.5* 11/25/2015   HCT 32.5* 11/25/2015   PLT 190.0 11/25/2015   GLUCOSE 103* 10/15/2015   CHOL 141 11/09/2013   TRIG 40.0 11/09/2013   HDL 68.20 11/09/2013   LDLCALC 65 11/09/2013   ALT 18 10/01/2015   AST 19 10/01/2015   NA 135 10/15/2015   K 4.2 10/15/2015   CL 101 10/15/2015   CREATININE 0.69 10/15/2015   BUN 25*  10/15/2015   CO2 28 10/15/2015   TSH 2.75 11/25/2015    No results found.  Assessment & Plan:   Problem List Items Addressed This Visit    LOW BACK PAIN, CHRONIC (Chronic)     managed with 2 vicodin daily.  Patient has not had an escalation of use of narcotics and is reminded not to share medication with others or to combine with alcohol or other sedating medications.  Patient is providing urine sample upon request per narcotics contract, Refill on narcotic was given today.         Relevant Medications   HYDROcodone-acetaminophen (NORCO) 10-325 MG tablet  Major depressive disorder, recurrent episode, moderate (HCC)    Adding remeron at bedtime.  Return in 2 weeks.       Relevant Medications   mirtazapine (REMERON) 15 MG tablet   MCI (mild cognitive impairment) with memory loss    Reassurance provided that her vascular dementia was not progressing based on my evaluations and that her anxiety was amplifying her symptoms.       High vitamin D level    Level rechecked in January and was > 100.  She has refused to suspend her supplement until she speaks with her endocrinologist.  Risks of continuing Vit D described., which includes muscle weakness and confusion, which she has been reporting         Anemia of chronic disease    Stable upon recheck today. Lab Results  Component Value Date   IRON 85 11/25/2015   TIBC 377 11/25/2015   FERRITIN 14.8 11/25/2015   . Lab Results  Component Value Date   VITAMINB12 >1500* 11/25/2015   No results found for: FOLATE       Other Visit Diagnoses    Tremulousness    -  Primary    Relevant Orders    TSH (Completed)    Anemia, iron deficiency        Relevant Orders    CBC with Differential/Platelet (Completed)    Ferritin (Completed)    Folate RBC (Completed)    Iron and TIBC (Completed)    Vitamin B12 (Completed)      A total of 25 minutes of face to face time was spent with patient more than half of which was spent in  counselling about the above mentioned conditions  and coordination of care  I am having Ms. Discher start on mirtazapine. I am also having her maintain her zoledronic acid, Vitamin D (Ergocalciferol), calcium citrate-vitamin D, Multiple Vitamins-Minerals (ICAPS PO), fish oil-omega-3 fatty acids, vitamin E, donepezil, aspirin, clonazePAM, solifenacin, ferrous fumarate, traZODone, HYDROcodone-acetaminophen, docusate sodium, traZODone, polyethylene glycol, omeprazole, meloxicam, pravastatin, oxybutynin, FLUoxetine HCl, and HYDROcodone-acetaminophen.  Meds ordered this encounter  Medications  . mirtazapine (REMERON) 15 MG tablet    Sig: Take 1 tablet (15 mg total) by mouth at bedtime.    Dispense:  90 tablet    Refill:  1  . HYDROcodone-acetaminophen (NORCO) 10-325 MG tablet    Sig: Take 1 tablet by mouth 2 (two) times daily. DAILY AS NEEDED FOR SEVERE PAIN    Dispense:  60 tablet    Refill:  0    Medications Discontinued During This Encounter  Medication Reason  . HYDROcodone-acetaminophen (NORCO) 10-325 MG tablet Reorder    Follow-up: Return in about 2 years (around 11/24/2017).   Crecencio Mc, MD

## 2015-11-25 NOTE — Progress Notes (Signed)
Pre-visit discussion using our clinic review tool. No additional management support is needed unless otherwise documented below in the visit note.  

## 2015-11-26 LAB — CBC WITH DIFFERENTIAL/PLATELET
BASOS PCT: 0.6 % (ref 0.0–3.0)
Basophils Absolute: 0 10*3/uL (ref 0.0–0.1)
EOS ABS: 0.1 10*3/uL (ref 0.0–0.7)
Eosinophils Relative: 1.3 % (ref 0.0–5.0)
HCT: 32.5 % — ABNORMAL LOW (ref 36.0–46.0)
HEMOGLOBIN: 10.5 g/dL — AB (ref 12.0–15.0)
LYMPHS ABS: 1.1 10*3/uL (ref 0.7–4.0)
Lymphocytes Relative: 20.5 % (ref 12.0–46.0)
MCHC: 32.4 g/dL (ref 30.0–36.0)
MCV: 85.7 fl (ref 78.0–100.0)
MONO ABS: 0.3 10*3/uL (ref 0.1–1.0)
Monocytes Relative: 4.7 % (ref 3.0–12.0)
NEUTROS PCT: 72.9 % (ref 43.0–77.0)
Neutro Abs: 4 10*3/uL (ref 1.4–7.7)
PLATELETS: 190 10*3/uL (ref 150.0–400.0)
RBC: 3.79 Mil/uL — ABNORMAL LOW (ref 3.87–5.11)
RDW: 21.3 % — AB (ref 11.5–15.5)
WBC: 5.5 10*3/uL (ref 4.0–10.5)

## 2015-11-26 LAB — FERRITIN: FERRITIN: 14.8 ng/mL (ref 10.0–291.0)

## 2015-11-26 LAB — TSH: TSH: 2.75 u[IU]/mL (ref 0.35–4.50)

## 2015-11-26 LAB — VITAMIN B12

## 2015-11-27 ENCOUNTER — Telehealth: Payer: Self-pay | Admitting: Internal Medicine

## 2015-11-27 DIAGNOSIS — D638 Anemia in other chronic diseases classified elsewhere: Secondary | ICD-10-CM | POA: Insufficient documentation

## 2015-11-27 LAB — IRON AND TIBC
%SAT: 23 % (ref 11–50)
IRON: 85 ug/dL (ref 45–160)
TIBC: 377 ug/dL (ref 250–450)
UIBC: 292 ug/dL (ref 125–400)

## 2015-11-27 LAB — FOLATE RBC

## 2015-11-27 NOTE — Assessment & Plan Note (Addendum)
Reassurance provided that her vascular dementia was not progressing based on my evaluations and that her anxiety was amplifying her symptoms.

## 2015-11-27 NOTE — Telephone Encounter (Signed)
Please remind patient tht her vitamin d level was too high last month and she had refused to stop her weekly megadose.  ONe of the symptoms of vitamin D  toxicity is muscle weakness,  Which she WAS REPORTING AT HER VISIT ON mONDAY

## 2015-11-27 NOTE — Assessment & Plan Note (Signed)
managed with 2 vicodin daily.  Patient has not had an escalation of use of narcotics and is reminded not to share medication with others or to combine with alcohol or other sedating medications.  Patient is providing urine sample upon request per narcotics contract, Refill on narcotic was given today.

## 2015-11-27 NOTE — Assessment & Plan Note (Addendum)
Level rechecked in January and was > 100.  She has refused to suspend her supplement until she speaks with her endocrinologist.  Risks of continuing Vit D described., which includes muscle weakness and confusion, which she has been reporting

## 2015-11-27 NOTE — Assessment & Plan Note (Addendum)
Adding remeron at bedtime.  Return in 2 weeks.

## 2015-11-27 NOTE — Assessment & Plan Note (Signed)
Stable upon recheck today. Lab Results  Component Value Date   IRON 85 11/25/2015   TIBC 377 11/25/2015   FERRITIN 14.8 11/25/2015   . Lab Results  Component Value Date   VITAMINB12 >1500* 11/25/2015   No results found for: FOLATE

## 2015-11-28 NOTE — Telephone Encounter (Signed)
Left message to call office. Patient has double message one in lab results.

## 2015-11-29 ENCOUNTER — Telehealth: Payer: Self-pay | Admitting: Internal Medicine

## 2015-11-29 ENCOUNTER — Encounter: Payer: Self-pay | Admitting: *Deleted

## 2015-11-29 NOTE — Telephone Encounter (Signed)
Called pt back several times today Unable to leave VM

## 2015-11-29 NOTE — Telephone Encounter (Signed)
Pt is calling for lab results. Please call her at home number.

## 2015-11-29 NOTE — Telephone Encounter (Signed)
Pt is calling about her lab results. Please call her at home.

## 2015-11-29 NOTE — Telephone Encounter (Signed)
[id number]

## 2015-11-29 NOTE — Telephone Encounter (Signed)
Left message to call office, for lab results .

## 2015-12-02 NOTE — Telephone Encounter (Signed)
Patient stated that she will reduce to every other week. Patient does not want to stop advised patient per MD order.

## 2015-12-09 ENCOUNTER — Encounter: Payer: Self-pay | Admitting: Internal Medicine

## 2015-12-09 ENCOUNTER — Ambulatory Visit (INDEPENDENT_AMBULATORY_CARE_PROVIDER_SITE_OTHER): Payer: PPO | Admitting: Internal Medicine

## 2015-12-09 VITALS — BP 104/58 | HR 103 | Temp 96.4°F | Resp 12 | Ht 61.0 in | Wt 122.5 lb

## 2015-12-09 DIAGNOSIS — F331 Major depressive disorder, recurrent, moderate: Secondary | ICD-10-CM

## 2015-12-09 NOTE — Patient Instructions (Addendum)
I recommend that you increase your mirtazipine to 1.5 tablets at bedtime, or 22.5 mg total  .  This medication is safe and should improve your appetite and your energy level when we get it to the right dose for you.    30 mg is the usual target dose for most people.   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  Your instincts are correct

## 2015-12-09 NOTE — Progress Notes (Signed)
Pre-visit discussion using our clinic review tool. No additional management support is needed unless otherwise documented below in the visit note.  

## 2015-12-09 NOTE — Progress Notes (Signed)
Subjective:  Patient ID: Meagan Zavala, female    DOB: 01-Jul-1946  Age: 70 y.o. MRN: [id number]  CC: The encounter diagnosis was Major depressive disorder, recurrent episode, moderate ([place]).  HPI Meagan Zavala presents for follow up on depression with weight loss .  She was evaluated  two weeks ago  And rlow dose remeron added.  Also advised to suspend  her Vitamin D intake given s/s of Vitamin D toxicity and elevated level but has refused to suspend it because she has not spoke with her endocrinologist uet. .  She has agreed to reduce her use of Vitamin D to  every other week  /She continue to report persistent fatigue and anorexia.  She up tired   No energy .  Does not snore. No restless sleep.  Voids 1 to 4 times per night.  Had an episode of nocturia and incontinence .  Has used clonazepam for anxiety prescribed by Dr Melrose Nakayama,  Gets it filled at [facility], does not want to stop.      Outpatient Prescriptions Prior to Visit  Medication Sig Dispense Refill  . aspirin 81 MG tablet Take 1 tablet (81 mg total) by mouth daily. 30 tablet 11  . calcium citrate-vitamin D 200-200 MG-UNIT TABS Take 3 tablets by mouth daily.     . clonazePAM (KLONOPIN) 0.5 MG tablet 0.25 mg 2 (two) times daily as needed.   0  . docusate sodium (COLACE) 100 MG capsule Take 100 mg by mouth 2 (two) times daily.    Marland Kitchen donepezil (ARICEPT) 10 MG tablet Take 1 tablet (10 mg total) by mouth at bedtime. TAKE ONE TABLET BY MOUTH AT BEDTIME AS NEEDED 901 tablet 1  . ferrous fumarate (HEMOCYTE - 106 MG FE) 325 (106 FE) MG TABS tablet Take 1 tablet by mouth daily. Reported on 10/15/2015    . fish oil-omega-3 fatty acids 1000 MG capsule Take 1 capsule by mouth daily.    Marland Kitchen FLUoxetine HCl 60 MG TABS TAKE ONE TABLET BY MOUTH ONCE DAILY 90 tablet 1  . HYDROcodone-acetaminophen (NORCO) 10-325 MG tablet Take 1 tablet by mouth 2 (two) times daily. As needed for severe pain  May refill on or after Sep 05 2015 60 tablet 0  .  HYDROcodone-acetaminophen (NORCO) 10-325 MG tablet Take 1 tablet by mouth 2 (two) times daily. DAILY AS NEEDED FOR SEVERE PAIN 60 tablet 0  . meloxicam (MOBIC) 7.5 MG tablet TAKE ONE TABLET BY MOUTH ONCE DAILY AS NEEDED FOR PAIN 90 tablet 0  . mirtazapine (REMERON) 15 MG tablet Take 1 tablet (15 mg total) by mouth at bedtime. 90 tablet 1  . Multiple Vitamins-Minerals (ICAPS PO) Take by mouth.    Marland Kitchen omeprazole (PRILOSEC) 40 MG capsule TAKE ONE CAPSULE BY MOUTH ONCE DAILY 90 capsule 2  . oxybutynin (DITROPAN XL) 10 MG 24 hr tablet Take 1 tablet (10 mg total) by mouth at bedtime. 30 tablet 5  . polyethylene glycol (MIRALAX) packet Take 17 g by mouth daily. 14 each 0  . pravastatin (PRAVACHOL) 20 MG tablet TAKE ONE TABLET BY MOUTH ONCE DAILY 90 tablet 3  . solifenacin (VESICARE) 5 MG tablet Take 1 tablet (5 mg total) by mouth daily. 90 tablet 0  . traZODone (DESYREL) 50 MG tablet TAKE ONE TO TWO TABLETS BY MOUTH ONCE DAILY 180 tablet 0  . traZODone (DESYREL) 50 MG tablet TAKE ONE TO TWO TABLETS BY MOUTH ONCE DAILY 180 tablet 0  . Vitamin D, Ergocalciferol, (DRISDOL) 50000  UNITS CAPS Take 50,000 Units by mouth every 7 (seven) days. Reported on 11/25/2015    . vitamin E 400 UNIT capsule Take 400 Units by mouth daily.    . zoledronic acid (RECLAST) 5 MG/100ML SOLN Every year.     No facility-administered medications prior to visit.    Review of Systems;  Patient denies headache, fevers, malaise, unintentional weight loss, skin rash, eye pain, sinus congestion and sinus pain, sore throat, dysphagia,  hemoptysis , cough, dyspnea, wheezing, chest pain, palpitations, orthopnea, edema, abdominal pain, nausea, melena, diarrhea, constipation, flank pain, dysuria, hematuria, urinary  Frequency, nocturia, numbness, tingling, seizures,  Focal weakness, Loss of consciousness,  Tremor, insomnia, depression, anxiety, and suicidal ideation.      Objective:  BP 104/58 mmHg  Pulse 103  Temp(Src) 96.4 F (35.8 C)  (Oral)  Resp 12  Ht 5\' 1"  (1.549 m)  Wt 122 lb 8 oz (55.566 kg)  BMI 23.16 kg/m2  SpO2 96%  BP Readings from Last 3 Encounters:  12/09/15 104/58  11/25/15 98/60  10/15/15 118/64    Wt Readings from Last 3 Encounters:  12/09/15 122 lb 8 oz (55.566 kg)  11/25/15 125 lb 8 oz (56.926 kg)  10/15/15 122 lb (55.339 kg)    General appearance: alert, cooperative and appears stated age Ears: normal TM's and external ear canals both ears Throat: lips, mucosa, and tongue normal; teeth and gums normal Neck: no adenopathy, no carotid bruit, supple, symmetrical, trachea midline and thyroid not enlarged, symmetric, no tenderness/mass/nodules Back: symmetric, no curvature. ROM normal. No CVA tenderness. Lungs: clear to auscultation bilaterally Heart: regular rate and rhythm, S1, S2 normal, no murmur, click, rub or gallop Abdomen: soft, non-tender; bowel sounds normal; no masses,  no organomegaly Pulses: 2+ and symmetric Skin: Skin color, texture, turgor normal. No rashes or lesions Lymph nodes: Cervical, supraclavicular, and axillary nodes normal.  No results found for: HGBA1C  Lab Results  Component Value Date   CREATININE 0.69 10/15/2015   CREATININE 0.86 10/01/2015   CREATININE 0.84 08/20/2015    Lab Results  Component Value Date   WBC 5.5 11/25/2015   HGB 10.5* 11/25/2015   HCT 32.5* 11/25/2015   PLT 190.0 11/25/2015   GLUCOSE 103* 10/15/2015   CHOL 141 11/09/2013   TRIG 40.0 11/09/2013   HDL 68.20 11/09/2013   LDLCALC 65 11/09/2013   ALT 18 10/01/2015   AST 19 10/01/2015   NA 135 10/15/2015   K 4.2 10/15/2015   CL 101 10/15/2015   CREATININE 0.69 10/15/2015   BUN 25* 10/15/2015   CO2 28 10/15/2015   TSH 2.75 11/25/2015    No results found.  Assessment & Plan:   Problem List Items Addressed This Visit    Major depressive disorder, recurrent episode, moderate ([place]) - Primary    She is tolerating  remeron but feels no benefit yet.  Increasing dose to 22.5  Mg.   Major stressors include family dysfunction and concern for her great niece and nephew who are living in poverty with their alcoholic mother.        A total of 25 minutes of face to face time was spent with patient more than half of which was spent in counselling on the above mentioned issues. .    I am having Ms. Ysidro Evert maintain her zoledronic acid, Vitamin D (Ergocalciferol), calcium citrate-vitamin D, Multiple Vitamins-Minerals (ICAPS PO), fish oil-omega-3 fatty acids, vitamin E, donepezil, aspirin, clonazePAM, solifenacin, ferrous fumarate, traZODone, HYDROcodone-acetaminophen, docusate sodium, traZODone, polyethylene glycol, omeprazole, meloxicam,  pravastatin, oxybutynin, FLUoxetine HCl, mirtazapine, and HYDROcodone-acetaminophen.  No orders of the defined types were placed in this encounter.    There are no discontinued medications.  Follow-up: Return in about 4 weeks (around 01/06/2016).   Crecencio Mc, MD

## 2015-12-11 NOTE — Assessment & Plan Note (Addendum)
She is tolerating  remeron but feels no benefit yet.  Increasing dose to 22.5  Mg.  Major stressors include family dysfunction and concern for her great niece and nephew who are living in poverty with their alcoholic mother.

## 2016-01-07 ENCOUNTER — Encounter: Payer: Self-pay | Admitting: Internal Medicine

## 2016-01-07 ENCOUNTER — Ambulatory Visit (INDEPENDENT_AMBULATORY_CARE_PROVIDER_SITE_OTHER): Payer: PPO | Admitting: Internal Medicine

## 2016-01-07 VITALS — BP 104/60 | HR 67 | Temp 98.2°F | Resp 12 | Ht 61.0 in | Wt 127.5 lb

## 2016-01-07 DIAGNOSIS — T452X1D Poisoning by vitamins, accidental (unintentional), subsequent encounter: Secondary | ICD-10-CM | POA: Diagnosis not present

## 2016-01-07 DIAGNOSIS — N3000 Acute cystitis without hematuria: Secondary | ICD-10-CM

## 2016-01-07 DIAGNOSIS — R300 Dysuria: Secondary | ICD-10-CM

## 2016-01-07 DIAGNOSIS — H01003 Unspecified blepharitis right eye, unspecified eyelid: Secondary | ICD-10-CM

## 2016-01-07 DIAGNOSIS — E673 Hypervitaminosis D: Secondary | ICD-10-CM

## 2016-01-07 DIAGNOSIS — F331 Major depressive disorder, recurrent, moderate: Secondary | ICD-10-CM | POA: Diagnosis not present

## 2016-01-07 DIAGNOSIS — G252 Other specified forms of tremor: Secondary | ICD-10-CM

## 2016-01-07 LAB — POCT URINALYSIS DIPSTICK
Bilirubin, UA: NEGATIVE
GLUCOSE UA: NEGATIVE
KETONES UA: NEGATIVE
Nitrite, UA: NEGATIVE
UROBILINOGEN UA: 0.2
pH, UA: 6

## 2016-01-07 LAB — VITAMIN D 25 HYDROXY (VIT D DEFICIENCY, FRACTURES): VITD: 73.52 ng/mL (ref 30.00–100.00)

## 2016-01-07 MED ORDER — AZITHROMYCIN 1 % OP SOLN: 1.0000 [drp] | Freq: Two times a day (BID) | OPHTHALMIC | Status: DC

## 2016-01-07 MED ORDER — HYDROCODONE-ACETAMINOPHEN 10-325 MG PO TABS: 1.0000 | ORAL_TABLET | Freq: Two times a day (BID) | ORAL | Status: DC

## 2016-01-07 MED ORDER — FLUCONAZOLE 150 MG PO TABS: 150.0000 mg | ORAL_TABLET | Freq: Every day | ORAL | Status: DC

## 2016-01-07 MED ORDER — CIPROFLOXACIN HCL 250 MG PO TABS: 250.0000 mg | ORAL_TABLET | Freq: Two times a day (BID) | ORAL | Status: DC

## 2016-01-07 NOTE — Progress Notes (Signed)
Subjective:  Patient ID: Meagan Zavala, female    DOB: 11-01-1945  Age: 70 y.o. MRN: [id number]  CC: The primary encounter diagnosis was Dysuria. Diagnoses of Vitamin D toxicity, accidental or unintentional, subsequent encounter, Major depressive disorder, recurrent episode, moderate ([place]), High vitamin D level, Blepharitis of eyelid of right eye, Acute cystitis without hematuria, and Intention tremor were also pertinent to this visit.  HPI QUIANNA Zavala presents for follow up  Multiple issues.    1)  MDD with recent increase in remeron .  She is tolerating the increased dose and reports and improvement in appetite. She has gained weight  and feels better on remeron 22.5 mg daily   2) Intention tremor getting worse. Having trouble writing checks.  The tremor is not present at rest.   3) Vit D toxicity:  She has not taken Vit D3 in a month since she was told to stop the medication due to elevated level.  She is worried about  bones breaking  4) She has developed a stye right upper eye lid. Using warm compresses.  No change.  Out of previously prescribed eye drops (azithro)   5) Dysuria  For the past 3 days,  Preceded by urinary hesitancy ,  positive dipstick UA  Outpatient Prescriptions Prior to Visit  Medication Sig Dispense Refill  . aspirin 81 MG tablet Take 1 tablet (81 mg total) by mouth daily. 30 tablet 11  . calcium citrate-vitamin D 200-200 MG-UNIT TABS Take 3 tablets by mouth daily.     . clonazePAM (KLONOPIN) 0.5 MG tablet 0.25 mg 2 (two) times daily as needed.   0  . docusate sodium (COLACE) 100 MG capsule Take 100 mg by mouth 2 (two) times daily.    Marland Kitchen donepezil (ARICEPT) 10 MG tablet Take 1 tablet (10 mg total) by mouth at bedtime. TAKE ONE TABLET BY MOUTH AT BEDTIME AS NEEDED 901 tablet 1  . ferrous fumarate (HEMOCYTE - 106 MG FE) 325 (106 FE) MG TABS tablet Take 1 tablet by mouth daily. Reported on 10/15/2015    . fish oil-omega-3 fatty acids 1000 MG capsule Take 1 capsule  by mouth daily.    Marland Kitchen FLUoxetine HCl 60 MG TABS TAKE ONE TABLET BY MOUTH ONCE DAILY 90 tablet 1  . meloxicam (MOBIC) 7.5 MG tablet TAKE ONE TABLET BY MOUTH ONCE DAILY AS NEEDED FOR PAIN 90 tablet 0  . mirtazapine (REMERON) 15 MG tablet Take 1 tablet (15 mg total) by mouth at bedtime. 90 tablet 1  . Multiple Vitamins-Minerals (ICAPS PO) Take by mouth.    Marland Kitchen omeprazole (PRILOSEC) 40 MG capsule TAKE ONE CAPSULE BY MOUTH ONCE DAILY 90 capsule 2  . polyethylene glycol (MIRALAX) packet Take 17 g by mouth daily. 14 each 0  . pravastatin (PRAVACHOL) 20 MG tablet TAKE ONE TABLET BY MOUTH ONCE DAILY 90 tablet 3  . solifenacin (VESICARE) 5 MG tablet Take 1 tablet (5 mg total) by mouth daily. 90 tablet 0  . traZODone (DESYREL) 50 MG tablet TAKE ONE TO TWO TABLETS BY MOUTH ONCE DAILY 180 tablet 0  . vitamin E 400 UNIT capsule Take 400 Units by mouth daily.    . zoledronic acid (RECLAST) 5 MG/100ML SOLN Every year.    Marland Kitchen HYDROcodone-acetaminophen (NORCO) 10-325 MG tablet Take 1 tablet by mouth 2 (two) times daily. As needed for severe pain  May refill on or after Sep 05 2015 60 tablet 0  . HYDROcodone-acetaminophen (NORCO) 10-325 MG tablet Take 1  tablet by mouth 2 (two) times daily. DAILY AS NEEDED FOR SEVERE PAIN 60 tablet 0  . oxybutynin (DITROPAN XL) 10 MG 24 hr tablet Take 1 tablet (10 mg total) by mouth at bedtime. 30 tablet 5  . Vitamin D, Ergocalciferol, (DRISDOL) 50000 UNITS CAPS Take 50,000 Units by mouth every 7 (seven) days. Reported on 01/07/2016    . traZODone (DESYREL) 50 MG tablet TAKE ONE TO TWO TABLETS BY MOUTH ONCE DAILY 180 tablet 0   No facility-administered medications prior to visit.    Review of Systems;  Patient denies headache, fevers, malaise, unintentional weight loss, skin rash, eye pain, sinus congestion and sinus pain, sore throat, dysphagia,  hemoptysis , cough, dyspnea, wheezing, chest pain, palpitations, orthopnea, edema, abdominal pain, nausea, melena, diarrhea, constipation,  flank pain, dysuria, hematuria, urinary  Frequency, nocturia, numbness, tingling, seizures,  Focal weakness, Loss of consciousness,  Tremor, insomnia, depression, anxiety, and suicidal ideation.      Objective:  BP 104/60 mmHg  Pulse 67  Temp(Src) 98.2 F (36.8 C) (Oral)  Resp 12  Ht 5\' 1"  (1.549 m)  Wt 127 lb 8 oz (57.834 kg)  BMI 24.10 kg/m2  SpO2 98%  BP Readings from Last 3 Encounters:  01/07/16 104/60  12/09/15 104/58  11/25/15 98/60    Wt Readings from Last 3 Encounters:  01/07/16 127 lb 8 oz (57.834 kg)  12/09/15 122 lb 8 oz (55.566 kg)  11/25/15 125 lb 8 oz (56.926 kg)    General appearance: alert, cooperative and appears stated age Ears: normal TM's and external ear canals both ears Throat: lips, mucosa, and tongue normal; teeth and gums normal Neck: no adenopathy, no carotid bruit, supple, symmetrical, trachea midline and thyroid not enlarged, symmetric, no tenderness/mass/nodules Back: symmetric, no curvature. ROM normal. No CVA tenderness. Lungs: clear to auscultation bilaterally Heart: regular rate and rhythm, S1, S2 normal, no murmur, click, rub or gallop Abdomen: soft, non-tender; bowel sounds normal; no masses,  no organomegaly Pulses: 2+ and symmetric Skin: Skin color, texture, turgor normal. No rashes or lesions Lymph nodes: Cervical, supraclavicular, and axillary nodes normal.  No results found for: HGBA1C  Lab Results  Component Value Date   CREATININE 0.69 10/15/2015   CREATININE 0.86 10/01/2015   CREATININE 0.84 08/20/2015    Lab Results  Component Value Date   WBC 5.5 11/25/2015   HGB 10.5* 11/25/2015   HCT 32.5* 11/25/2015   PLT 190.0 11/25/2015   GLUCOSE 103* 10/15/2015   CHOL 141 11/09/2013   TRIG 40.0 11/09/2013   HDL 68.20 11/09/2013   LDLCALC 65 11/09/2013   ALT 18 10/01/2015   AST 19 10/01/2015   NA 135 10/15/2015   K 4.2 10/15/2015   CL 101 10/15/2015   CREATININE 0.69 10/15/2015   BUN 25* 10/15/2015   CO2 28 10/15/2015     TSH 2.75 11/25/2015    No results found.  Assessment & Plan:   Problem List Items Addressed This Visit    Major depressive disorder, recurrent episode, moderate ([facility])    improved mood and appetite.  Continue 22.5 mg Remeron        High vitamin D level    Vit D megadose suspended  In March secondary to supratherapeutic level.        Blepharitis of eyelid of right eye    Azithromycin eye drops prescribed.       UTI (urinary tract infection)    Empiric cipro prescribed for symptoms supported by UA  Relevant Medications   fluconazole (DIFLUCAN) 150 MG tablet   Intention tremor    May be from Vit D toxicityu. Referral to Neurology for evaluation if not resolved by next visit        Other Visit Diagnoses    Dysuria    -  Primary    Relevant Orders    POCT Urinalysis Dipstick (Completed)    Urine culture    Vitamin D toxicity, accidental or unintentional, subsequent encounter        Relevant Orders    VITAMIN D 25 Hydroxy (Vit-D Deficiency, Fractures) (Completed)    Urine culture       I recommend getting the majority of your calcium and Vitamin D  through diet rather than supplements given the recent association of calcium supplements with increased coronary artery calcium scores.  You need 1200 mg calcium daily adn 1000 IUs of Vit D3 daily )  Try the [employer] brand :  Monica Martinez,  Original formula.  Delicious,  Low carb,  Low cal,  Cholesterol free!  433 mg calcium per 8 ounce serving (more than a glass of milk) `4 I have changed Ms. Strawder's azithromycin and HYDROcodone-acetaminophen. I am also having her start on ciprofloxacin and fluconazole. Additionally, I am having her maintain her zoledronic acid, Vitamin D (Ergocalciferol), calcium citrate-vitamin D, Multiple Vitamins-Minerals (ICAPS PO), fish oil-omega-3 fatty acids, vitamin E, donepezil, aspirin, clonazePAM, solifenacin, ferrous fumarate, traZODone, docusate sodium, polyethylene glycol, omeprazole, meloxicam,  pravastatin, oxybutynin, FLUoxetine HCl, mirtazapine, and HYDROcodone-acetaminophen.  Meds ordered this encounter  Medications  . ciprofloxacin (CIPRO) 250 MG tablet    Sig: Take 1 tablet (250 mg total) by mouth 2 (two) times daily.    Dispense:  10 tablet    Refill:  0  . fluconazole (DIFLUCAN) 150 MG tablet    Sig: Take 1 tablet (150 mg total) by mouth daily.    Dispense:  2 tablet    Refill:  0  . azithromycin (AZASITE) 1 % ophthalmic solution    Sig: Place 1 drop into the right eye 2 (two) times daily.    Dispense:  2.5 mL    Refill:  2  . DISCONTD: HYDROcodone-acetaminophen (NORCO) 10-325 MG tablet    Sig: Take 1 tablet by mouth 2 (two) times daily. DAILY AS NEEDED FOR SEVERE PAIN    Dispense:  60 tablet    Refill:  0    May refill on or after March 08 2016  . HYDROcodone-acetaminophen (NORCO) 10-325 MG tablet    Sig: Take 1 tablet by mouth 2 (two) times daily. DAILY AS NEEDED FOR SEVERE PAIN    Dispense:  60 tablet    Refill:  0    May refill on or after Feb 06 2016  . HYDROcodone-acetaminophen (NORCO) 10-325 MG tablet    Sig: Take 1 tablet by mouth 2 (two) times daily. As needed for severe pain    Dispense:  60 tablet    Refill:  0   A total of 25 minutes of face to face time was spent with patient more than half of which was spent in counselling about the above mentioned conditions  and coordination of care   Medications Discontinued During This Encounter  Medication Reason  . traZODone (DESYREL) 50 MG tablet Duplicate  . azithromycin (AZASITE) 1 % ophthalmic solution Reorder  . HYDROcodone-acetaminophen (NORCO) 10-325 MG tablet Reorder  . HYDROcodone-acetaminophen (NORCO) 10-325 MG tablet Reorder  . HYDROcodone-acetaminophen (NORCO) 10-325 MG tablet Reorder    Follow-up:  Return in about 3 months (around 04/07/2016).   Crecencio Mc, MD

## 2016-01-07 NOTE — Patient Instructions (Addendum)
I'm glad you are feeling better!  Please continue taking remeron 22.5 mg daily  Until your next visit in 3 months.   We are rechecking your Vitamin D level today  If your tremor is still present at follow up  In 3 months,  We will begin workup   You appear to have a UTI.  I have prescribed  Cipro to take 2 times daily  for 5 days  Your stye may clear up with the cipro,  But I have refilled the eye solution in case it does not   Please take a probiotic ( Align, Floraque or Culturelle)  Or  A generic for 2 weeks if you start the antibiotic to prevent a serious antibiotic associated diarrhea  Called" clostridium dificile colitis" ( should also help prevent   vaginal yeast infection)

## 2016-01-07 NOTE — Progress Notes (Signed)
Pre-visit discussion using our clinic review tool. No additional management support is needed unless otherwise documented below in the visit note.  

## 2016-01-10 DIAGNOSIS — H01003 Unspecified blepharitis right eye, unspecified eyelid: Secondary | ICD-10-CM | POA: Insufficient documentation

## 2016-01-10 DIAGNOSIS — N390 Urinary tract infection, site not specified: Secondary | ICD-10-CM | POA: Insufficient documentation

## 2016-01-10 DIAGNOSIS — G252 Other specified forms of tremor: Secondary | ICD-10-CM | POA: Insufficient documentation

## 2016-01-10 NOTE — Assessment & Plan Note (Addendum)
May be from Vit D toxicityu. Referral to Neurology for evaluation if not resolved by next visit

## 2016-01-10 NOTE — Assessment & Plan Note (Signed)
improved mood and appetite.  Continue 22.5 mg Remeron

## 2016-01-10 NOTE — Assessment & Plan Note (Signed)
Azithromycin eye drops prescribed.

## 2016-01-10 NOTE — Assessment & Plan Note (Signed)
Empiric cipro prescribed for symptoms supported by UA

## 2016-01-10 NOTE — Assessment & Plan Note (Signed)
Vit D megadose suspended  In March secondary to supratherapeutic level.

## 2016-01-11 LAB — URINE CULTURE: Colony Count: >=100000

## 2016-01-13 ENCOUNTER — Other Ambulatory Visit: Payer: Self-pay | Admitting: Internal Medicine

## 2016-01-13 NOTE — Telephone Encounter (Signed)
Refilled 09/2015 with a 90-day supply. Last seen 01/07/16. Please advise?

## 2016-01-14 NOTE — Telephone Encounter (Signed)
refilled 

## 2016-04-07 ENCOUNTER — Ambulatory Visit (INDEPENDENT_AMBULATORY_CARE_PROVIDER_SITE_OTHER): Payer: PPO | Admitting: Internal Medicine

## 2016-04-07 ENCOUNTER — Encounter: Payer: Self-pay | Admitting: Internal Medicine

## 2016-04-07 VITALS — BP 110/74 | HR 59 | Temp 98.2°F | Resp 12 | Ht 61.0 in | Wt 132.8 lb

## 2016-04-07 DIAGNOSIS — D638 Anemia in other chronic diseases classified elsewhere: Secondary | ICD-10-CM

## 2016-04-07 DIAGNOSIS — E559 Vitamin D deficiency, unspecified: Secondary | ICD-10-CM

## 2016-04-07 DIAGNOSIS — N3941 Urge incontinence: Secondary | ICD-10-CM

## 2016-04-07 DIAGNOSIS — L98492 Non-pressure chronic ulcer of skin of other sites with fat layer exposed: Secondary | ICD-10-CM

## 2016-04-07 DIAGNOSIS — G252 Other specified forms of tremor: Secondary | ICD-10-CM | POA: Diagnosis not present

## 2016-04-07 DIAGNOSIS — E038 Other specified hypothyroidism: Secondary | ICD-10-CM

## 2016-04-07 DIAGNOSIS — E034 Atrophy of thyroid (acquired): Secondary | ICD-10-CM

## 2016-04-07 MED ORDER — OXYBUTYNIN CHLORIDE ER 15 MG PO TB24: 15.0000 mg | ORAL_TABLET | Freq: Every day | ORAL | Status: AC

## 2016-04-07 NOTE — Progress Notes (Signed)
Subjective:  Patient ID: Meagan Zavala, female    DOB: 1946/05/11  Age: 70 y.o. MRN: [id number]  CC: The primary encounter diagnosis was Intention tremor. Diagnoses of Anemia of chronic disease, Urge incontinence of urine, Vitamin D deficiency, Hypothyroidism due to acquired atrophy of thyroid, and Ulcer of abdomen wall with fat layer exposed ([place]) were also pertinent to this visit.  HPI Meagan Zavala presents for follow up on multiple issues.     1) Hypothyroidism:  She was prescribed  levothyroxine by Dr. Francoise Schaumann for TSH of 8. TSH was normal in March . She has been taking it for only a week.   2) She continues to have a normocytic anemia which is stable at 10.3   3) elevated AST/ALT noted.  Were normal in january   4) Vit D deficiency;  Her supratherapeutic level 6 months ago has dropped from > 100 to 73 to 43 (every 3 months)  She has been taking 50,000 Ius every other week since she was advised to suspend it.    5) Urge incontinence:  She is requesting to have her Ditropan dose increased form 10 mg to 15 to manage her symptoms of  urge incontinence during the day   6) She has developed a nonhealing ulcer on her abdominal wall that has been present for 6 weeks. in the periumbilical area, on the edge of a fold of skin leftover as a sequelae of her bariatric surgery induced weight loss (remote).  She has been treating with neosporin and covering it with a  bandaid which has not been very effective.  She notes some discharge  But no redness of the surrounding skin.   Needs mammogram change in venue [facility] in Sept[facility]oct  7) She has been noting a tremor of right hand, that is not present at rest but aggravated by use of hand.  Interfering with her ability to write.   8) Anxiety:  Chronic , aggravated by familial stressors caused by her financial burden of her grown drug abusing niece who has been requesting money from her for years, resulting in the patient still working to support  her niece.  She has finally distanced herself emotionally from her niece and is no longer supporting her, and feels better.   Outpatient Prescriptions Prior to Visit  Medication Sig Dispense Refill  . aspirin 81 MG tablet Take 1 tablet (81 mg total) by mouth daily. 30 tablet 11  . azithromycin (AZASITE) 1 % ophthalmic solution Place 1 drop into the right eye 2 (two) times daily. 2.5 mL 2  . calcium citrate-vitamin D 200-200 MG-UNIT TABS Take 3 tablets by mouth daily.     . clonazePAM (KLONOPIN) 0.5 MG tablet 0.25 mg 2 (two) times daily as needed.   0  . docusate sodium (COLACE) 100 MG capsule Take 100 mg by mouth 2 (two) times daily.    Marland Kitchen donepezil (ARICEPT) 10 MG tablet Take 1 tablet (10 mg total) by mouth at bedtime. TAKE ONE TABLET BY MOUTH AT BEDTIME AS NEEDED 901 tablet 1  . fish oil-omega-3 fatty acids 1000 MG capsule Take 1 capsule by mouth daily.    Marland Kitchen FLUoxetine HCl 60 MG TABS TAKE ONE TABLET BY MOUTH ONCE DAILY 90 tablet 1  . HYDROcodone-acetaminophen (NORCO) 10-325 MG tablet Take 1 tablet by mouth 2 (two) times daily. DAILY AS NEEDED FOR SEVERE PAIN 60 tablet 0  . HYDROcodone-acetaminophen (NORCO) 10-325 MG tablet Take 1 tablet by mouth 2 (two) times daily.  As needed for severe pain 60 tablet 0  . meloxicam (MOBIC) 7.5 MG tablet TAKE ONE TABLET BY MOUTH ONCE DAILY AS NEEDED FOR PAIN 90 tablet 0  . mirtazapine (REMERON) 15 MG tablet Take 1 tablet (15 mg total) by mouth at bedtime. 90 tablet 1  . Multiple Vitamins-Minerals (ICAPS PO) Take by mouth.    Marland Kitchen omeprazole (PRILOSEC) 40 MG capsule TAKE ONE CAPSULE BY MOUTH ONCE DAILY 90 capsule 2  . pravastatin (PRAVACHOL) 20 MG tablet TAKE ONE TABLET BY MOUTH ONCE DAILY 90 tablet 3  . traZODone (DESYREL) 50 MG tablet TAKE ONE TO TWO TABLETS BY MOUTH ONCE DAILY 180 tablet 0  . Vitamin D, Ergocalciferol, (DRISDOL) 50000 UNITS CAPS Take 50,000 Units by mouth every 14 (fourteen) days.    . zoledronic acid (RECLAST) 5 MG/100ML SOLN Every year.    Marland Kitchen  oxybutynin (DITROPAN XL) 10 MG 24 hr tablet Take 1 tablet (10 mg total) by mouth at bedtime. 30 tablet 5  . solifenacin (VESICARE) 5 MG tablet Take 1 tablet (5 mg total) by mouth daily. 90 tablet 0  . ferrous fumarate (HEMOCYTE - 106 MG FE) 325 (106 FE) MG TABS tablet Take 1 tablet by mouth daily. Reported on 04/07/2016    . vitamin E 400 UNIT capsule Take 400 Units by mouth daily. Reported on 04/07/2016    . ciprofloxacin (CIPRO) 250 MG tablet Take 1 tablet (250 mg total) by mouth 2 (two) times daily. 10 tablet 0  . fluconazole (DIFLUCAN) 150 MG tablet Take 1 tablet (150 mg total) by mouth daily. (Patient not taking: Reported on 04/07/2016) 2 tablet 0  . polyethylene glycol (MIRALAX) packet Take 17 g by mouth daily. (Patient not taking: Reported on 04/07/2016) 14 each 0   No facility-administered medications prior to visit.    Review of Systems;  Patient denies headache, fevers, malaise, unintentional weight loss, skin rash, eye pain, sinus congestion and sinus pain, sore throat, dysphagia,  hemoptysis , cough, dyspnea, wheezing, chest pain, palpitations, orthopnea, edema, abdominal pain, nausea, melena, diarrhea, constipation, flank pain, dysuria, hematuria, urinary  Frequency, nocturia, numbness, tingling, seizures,  Focal weakness, Loss of consciousness,  Tremor, insomnia, depression, anxiety, and suicidal ideation.      Objective:  BP 110/74 mmHg  Pulse 59  Temp(Src) 98.2 F (36.8 C) (Oral)  Resp 12  Ht 5\' 1"  (1.549 m)  Wt 132 lb 12 oz (60.215 kg)  BMI 25.10 kg/m2  SpO2 97%  BP Readings from Last 3 Encounters:  04/07/16 110/74  01/07/16 104/60  12/09/15 104/58    Wt Readings from Last 3 Encounters:  04/07/16 132 lb 12 oz (60.215 kg)  01/07/16 127 lb 8 oz (57.834 kg)  12/09/15 122 lb 8 oz (55.566 kg)    General appearance: alert, cooperative and appears stated age Neck: no adenopathy, no carotid bruit, supple, symmetrical, trachea midline and thyroid not enlarged, symmetric,  no tenderness/mass/nodules Back: symmetric, kyphotic. ROM normal. No CVA tenderness. Lungs: clear to auscultation bilaterally Heart: regular rate and rhythm, S1, S2 normal, no murmur, click, rub or gallop Abdomen: soft, non-tender; bowel sounds normal; no masses,  no organomegaly Skin: small stage 3 ulcer of abdominal skin fold periumbilical om the right  Pulses: 2+ and symmetric Skin: Skin color, texture, turgor normal. No rashes or lesions Lymph nodes: Cervical, supraclavicular, and axillary nodes normal. Neuro: CNs 2-12 intact. DTRs 2+/4 in biceps, brachioradialis, patellars and achilles. Muscle strength 5/5 in upper and lower exremities. No  resting tremor bilaterally , intention  tremor right > left  ,  cerebellar function normal. Romberg negative.  No pronator drift.   Gait normal.   No results found for: HGBA1C  Lab Results  Component Value Date   CREATININE 0.69 10/15/2015   CREATININE 0.86 10/01/2015   CREATININE 0.84 08/20/2015    Lab Results  Component Value Date   WBC 5.5 11/25/2015   HGB 10.5* 11/25/2015   HCT 32.5* 11/25/2015   PLT 190.0 11/25/2015   GLUCOSE 103* 10/15/2015   CHOL 141 11/09/2013   TRIG 40.0 11/09/2013   HDL 68.20 11/09/2013   LDLCALC 65 11/09/2013   ALT 18 10/01/2015   AST 19 10/01/2015   NA 135 10/15/2015   K 4.2 10/15/2015   CL 101 10/15/2015   CREATININE 0.69 10/15/2015   BUN 25* 10/15/2015   CO2 28 10/15/2015   TSH 2.75 11/25/2015    No results found.  Assessment & Plan:   Problem List Items Addressed This Visit    Urinary incontinence    Detrol dose increased to 15 mg daily      Relevant Medications   oxybutynin (DITROPAN XL) 15 MG 24 hr tablet   Anemia of chronic disease    First noted in 2016 and Stable upon recheck today. Etiology is  not iron or B12 deficiency. She has CKD and an eating disorder .  Last colonoscopy was 2013 and 3 polyps were noted.  Likely due to ckd.   Lab Results  Component Value Date   WBC 5.5  11/25/2015   HGB 10.5* 11/25/2015   HCT 32.5* 11/25/2015   MCV 85.7 11/25/2015   PLT 190.0 11/25/2015     Lab Results  Component Value Date   IRON 85 11/25/2015   TIBC 377 11/25/2015   FERRITIN 14.8 11/25/2015   . Lab Results  Component Value Date   VITAMINB12 >1500* 11/25/2015   No results found for: FOLATE        Relevant Orders   CBC with Differential/Platelet   Erythropoietin   Folate RBC   Intention tremor - Primary    Persistent despite resolution of Vit D toxicity. Discussed diagnosis with patient,  And the treatment which involves more medication , which she wants to avoid.  Reassurance provided, advised to discuss treatment with her neurologist .      Vitamin D deficiency    Secondary to bariatric surgery. Level was > 100 in January and has been dropping since then, on 50,000 Ius every other week,  Repeat level in 6 weeks with thyroid       Relevant Orders   VITAMIN D 25 Hydroxy (Vit-D Deficiency, Fractures)   Hypothyroidism    Continue 25 mcg daily and repeat level in 6 weeks.       Relevant Medications   SYNTHROID 25 MCG tablet   Other Relevant Orders   TSH   Ulcer of abdomen wall with fat layer exposed ([place])    Ulcer does not appear purulent and has been present for 6 weeks . It will need debridement to resolve. Referral to [place].        A total of 40 minutes was spent with patient more than half of which was spent in counseling patient on the above mentioned issues , reviewing and explaining recent labs and imaging studies done, and coordination of care.  I have discontinued Ms. Gasbarro's solifenacin, polyethylene glycol, ciprofloxacin, and fluconazole. I have also changed her oxybutynin. Additionally, I am having her maintain her zoledronic acid, Vitamin D (  Ergocalciferol), calcium citrate-vitamin D, Multiple Vitamins-Minerals (ICAPS PO), fish oil-omega-3 fatty acids, vitamin E, donepezil, aspirin, clonazePAM, ferrous fumarate, traZODone,  docusate sodium, omeprazole, pravastatin, FLUoxetine HCl, mirtazapine, azithromycin, HYDROcodone-acetaminophen, HYDROcodone-acetaminophen, meloxicam, and SYNTHROID.  Meds ordered this encounter  Medications  . SYNTHROID 25 MCG tablet    Sig: Take 25 mcg by mouth daily before breakfast.   . oxybutynin (DITROPAN XL) 15 MG 24 hr tablet    Sig: Take 1 tablet (15 mg total) by mouth at bedtime.    Dispense:  90 tablet    Refill:  2    Medications Discontinued During This Encounter  Medication Reason  . ciprofloxacin (CIPRO) 250 MG tablet Completed Course  . solifenacin (VESICARE) 5 MG tablet   . oxybutynin (DITROPAN XL) 10 MG 24 hr tablet Reorder  . polyethylene glycol (MIRALAX) packet   . fluconazole (DIFLUCAN) 150 MG tablet     Follow-up: Return in about 6 weeks (around 05/19/2016) for nofasting labs,  wellness exam .   Crecencio Mc, MD

## 2016-04-07 NOTE — Progress Notes (Signed)
Pre-visit discussion using our clinic review tool. No additional management support is needed unless otherwise documented below in the visit note.  

## 2016-04-07 NOTE — Patient Instructions (Addendum)
Return for a thyroid level in 5 to 6 weeks.  continue levothyroxine   Your liver enzymes were only elevated up by 2 tiny points,  Fatty liver,  No worries,  No more meds needed  We will rechekc in 6 weeks   Continue 50,000 units  of D3 every other week and we willl recheck  level in 6 weeks   Your anemia is no better/no worse with iron therapy .  We will recheck in 6 weeks OFF of iron   I am sending  you home with stool card  so we can  check your stool for occult blood  I am referring you to Wound Care to heal your abdominal ulcer.  Keep a piece of gauze in the fold (secure with tape)  and cover wound with Vaseline   Our "intention tremor" is hard to treat .  Talk to our neurologist about it.   Tremor A tremor is trembling or shaking that you cannot control. Most tremors affect the hands or arms. Tremors can also affect the head, vocal cords, face, and other parts of the body. There are many types of tremors. Common types include:   Essential tremor. These usually occur in people over the age of 70. It may run in families and can happen in otherwise healthy people.   Resting tremor. These occur when the muscles are at rest, such as when your hands are resting in your lap. People with Parkinson disease often have resting tremors.   Postural tremor. These occur when you try to hold a pose, such as keeping your hands outstretched.   Kinetic tremor. These occur during purposeful movement, such as trying to touch a finger to your nose.   Task-specific tremor. These may occur when you perform tasks such as handwriting, speaking, or standing.   Psychogenic tremor. These dramatically lessen or disappear when you are distracted. They can happen in people of all ages.  Some types of tremors have no known cause. Tremors can also be a symptom of nervous system problems (neurological disorders) that may occur with aging. Some tremors go away with treatment while others do not.  HOME CARE  INSTRUCTIONS Watch your tremor for any changes. The following actions may help to lessen any discomfort you are feeling:  Take medicines only as directed by your health care provider.   Limit alcohol intake to no more than 1 drink per day for nonpregnant women and 2 drinks per day for men. One drink equals 12 oz of beer, 5 oz of wine, or 1 oz of hard liquor.  Do not use any tobacco products, including cigarettes, chewing tobacco, or electronic cigarettes. If you need help quitting, ask your health care provider.   Avoid extreme heat or cold.   Limit the amount of caffeine you consumeas directed by your health care provider.   Try to get 8 hours of sleep each night.  Find ways to manage your stress, such as meditation or yoga.  Keep all follow-up visits as directed by your health care provider. This is important. SEEK MEDICAL CARE IF:  You start having a tremor after starting a new medicine.  You have tremor with other symptoms such as:  Numbness.  Tingling.  Pain.  Weakness.  Your tremor gets worse.  Your tremor interferes with your day-to-day life.   This information is not intended to replace advice given to you by your health care provider. Make sure you discuss any questions you have with your health  care provider.   Document Released: 09/04/2002 Document Revised: 10/05/2014 Document Reviewed: 03/12/2014 Elsevier Interactive Patient Education [employer].

## 2016-04-09 ENCOUNTER — Telehealth: Payer: Self-pay | Admitting: *Deleted

## 2016-04-09 DIAGNOSIS — E039 Hypothyroidism, unspecified: Secondary | ICD-10-CM | POA: Insufficient documentation

## 2016-04-09 DIAGNOSIS — E559 Vitamin D deficiency, unspecified: Secondary | ICD-10-CM | POA: Insufficient documentation

## 2016-04-09 DIAGNOSIS — Z1239 Encounter for other screening for malignant neoplasm of breast: Secondary | ICD-10-CM

## 2016-04-09 DIAGNOSIS — L98492 Non-pressure chronic ulcer of skin of other sites with fat layer exposed: Secondary | ICD-10-CM

## 2016-04-09 NOTE — Telephone Encounter (Signed)
In process 

## 2016-04-09 NOTE — Assessment & Plan Note (Signed)
Detrol dose increased to 15 mg daily

## 2016-04-09 NOTE — Telephone Encounter (Signed)
PAtient was seen on 04/07/16, notes states : am referring you to Wound Care to heal your abdominal ulcer. Keep a piece of gauze in the fold (secure with tape) and cover wound with Vaseline   Please advise as order is not in, thanks

## 2016-04-09 NOTE — Telephone Encounter (Signed)
See below, notify patient it is in process, thanks

## 2016-04-09 NOTE — Telephone Encounter (Signed)
Patient stated that she was to have a referral to the wound center. She was checking on the status of this referral

## 2016-04-09 NOTE — Assessment & Plan Note (Addendum)
Secondary to bariatric surgery. Level was > 100 in January and has been dropping since then, on 50,000 Ius every other week,  Repeat level in 6 weeks with thyroid

## 2016-04-09 NOTE — Assessment & Plan Note (Signed)
Ulcer does not appear purulent and has been present for 6 weeks . It will need debridement to resolve. Referral to [facility].

## 2016-04-09 NOTE — Assessment & Plan Note (Signed)
Persistent despite resolution of Vit D toxicity. Discussed diagnosis with patient,  And the treatment which involves more medication , which she wants to avoid.  Reassurance provided, advised to discuss treatment with her neurologist .

## 2016-04-09 NOTE — Assessment & Plan Note (Signed)
Continue 25 mcg daily and repeat level in 6 weeks.

## 2016-04-09 NOTE — Assessment & Plan Note (Addendum)
First noted in 2016 and Stable upon recheck today. Etiology is  not iron or B12 deficiency. She has CKD and an eating disorder .  Last colonoscopy was 2013 and 3 polyps were noted.  Likely due to ckd.   Lab Results  Component Value Date   WBC 5.5 11/25/2015   HGB 10.5* 11/25/2015   HCT 32.5* 11/25/2015   MCV 85.7 11/25/2015   PLT 190.0 11/25/2015     Lab Results  Component Value Date   IRON 85 11/25/2015   TIBC 377 11/25/2015   FERRITIN 14.8 11/25/2015   . Lab Results  Component Value Date   VITAMINB12 >1500* 11/25/2015   No results found for: FOLATE

## 2016-04-10 ENCOUNTER — Encounter: Payer: Self-pay | Admitting: Family Medicine

## 2016-04-10 ENCOUNTER — Ambulatory Visit (INDEPENDENT_AMBULATORY_CARE_PROVIDER_SITE_OTHER): Payer: PPO | Admitting: Family Medicine

## 2016-04-10 VITALS — BP 108/68 | HR 60 | Temp 98.1°F | Ht 61.0 in | Wt 136.0 lb

## 2016-04-10 DIAGNOSIS — L98492 Non-pressure chronic ulcer of skin of other sites with fat layer exposed: Secondary | ICD-10-CM

## 2016-04-10 MED ORDER — DOXYCYCLINE HYCLATE 100 MG PO TABS: 100.0000 mg | ORAL_TABLET | Freq: Two times a day (BID) | ORAL | Status: DC

## 2016-04-10 NOTE — Assessment & Plan Note (Signed)
Ulcer does appear to have some purulent drainage and now has surrounding erythema concerning for cellulitis and infection. Agree that she will need debridement through the wound Center and discussed this with our referral coordinator to try to get her in next week. Given purulence and surrounding erythema we will start her on doxycycline as outlined below to cover for infection. She'll continue to monitor the area. If any change or worsening while on the antibiotic she'll seek medical attention. She's given return precautions. She will follow-up on Monday if she is unable to get into the wound clinic on Monday.

## 2016-04-10 NOTE — Telephone Encounter (Signed)
Pt states that she needs a mammogram at [facility]. No order is in her chart. Patient was given number to call to schedule this. Please enter order. Thanks!

## 2016-04-10 NOTE — Patient Instructions (Signed)
Nice to meet you. We will start her on doxycycline for your ulceration and surrounding skin infection. You should continue to use gauze to keep this covered. If you develop enlarging skin lesion, spreading redness, fevers, pain, or any new or changing symptoms please seek medical attention.

## 2016-04-10 NOTE — Progress Notes (Signed)
Patient ID: Meagan Zavala, female   DOB: 1945/12/26, 70 y.o.   MRN: [id number]  Tommi Rumps, MD Phone: [phone]  Meagan Zavala is a 70 y.o. female who presents today for same-day visit.  Patient seen 3 days ago and noted to have a small ulceration on her abdominal wall are noted at that time it did not appear infected. Since then the patient reports the sore has become slightly larger and is draining purulent material. There is surrounding redness now. No fevers. She feels well overall. Has been using Neosporin on it. Has been drinking plenty of fluids. Was previously referred to wound Center though has not heard anything regarding this.   ROS see history of present illness  Objective  Physical Exam Filed Vitals:   04/10/16 1337  BP: 108/68  Pulse: 60  Temp: 98.1 F (36.7 C)    BP Readings from Last 3 Encounters:  04/10/16 108/68  04/07/16 110/74  01/07/16 104/60   Wt Readings from Last 3 Encounters:  04/10/16 136 lb (61.689 kg)  04/07/16 132 lb 12 oz (60.215 kg)  01/07/16 127 lb 8 oz (57.834 kg)    Physical Exam  Constitutional: She is well-developed, well-nourished, and in no distress.  HENT:  Head: Normocephalic and atraumatic.  Cardiovascular: Normal rate, regular rhythm and normal heart sounds.   Pulmonary/Chest: Effort normal.  Neurological: She is alert. Gait normal.  Skin:        Assessment/Plan: Please see individual problem list.  Ulcer of abdomen wall with fat layer exposed ([place]) Ulcer does appear to have some purulent drainage and now has surrounding erythema concerning for cellulitis and infection. Agree that she will need debridement through the wound Center and discussed this with our referral coordinator to try to get her in next week. Given purulence and surrounding erythema we will start her on doxycycline as outlined below to cover for infection. She'll continue to monitor the area. If any change or worsening while on the antibiotic  she'll seek medical attention. She's given return precautions. She will follow-up on Monday if she is unable to get into the wound clinic on Monday.    Orders Placed This Encounter  Procedures  . AMB referral to wound care center    Referral Priority:  Routine    Referral Type:  Consultation    Number of Visits Requested:  1    Meds ordered this encounter  Medications  . doxycycline (VIBRA-TABS) 100 MG tablet    Sig: Take 1 tablet (100 mg total) by mouth 2 (two) times daily.    Dispense:  14 tablet    Refill:  0   Tommi Rumps, MD [place]

## 2016-04-10 NOTE — Telephone Encounter (Signed)
Order in.

## 2016-04-10 NOTE — Progress Notes (Signed)
Pre visit review using our clinic review tool, if applicable. No additional management support is needed unless otherwise documented below in the visit note. 

## 2016-04-13 ENCOUNTER — Other Ambulatory Visit: Payer: Self-pay | Admitting: Internal Medicine

## 2016-04-13 ENCOUNTER — Ambulatory Visit: Payer: PPO | Admitting: Internal Medicine

## 2016-04-16 ENCOUNTER — Encounter: Payer: PPO | Attending: Surgery | Admitting: Surgery

## 2016-04-16 DIAGNOSIS — K219 Gastro-esophageal reflux disease without esophagitis: Secondary | ICD-10-CM | POA: Diagnosis not present

## 2016-04-16 DIAGNOSIS — Z87891 Personal history of nicotine dependence: Secondary | ICD-10-CM | POA: Insufficient documentation

## 2016-04-16 DIAGNOSIS — S31105A Unspecified open wound of abdominal wall, periumbilic region without penetration into peritoneal cavity, initial encounter: Secondary | ICD-10-CM | POA: Insufficient documentation

## 2016-04-16 DIAGNOSIS — E039 Hypothyroidism, unspecified: Secondary | ICD-10-CM | POA: Diagnosis not present

## 2016-04-16 DIAGNOSIS — N3941 Urge incontinence: Secondary | ICD-10-CM | POA: Insufficient documentation

## 2016-04-16 DIAGNOSIS — M810 Age-related osteoporosis without current pathological fracture: Secondary | ICD-10-CM | POA: Insufficient documentation

## 2016-04-16 DIAGNOSIS — C762 Malignant neoplasm of abdomen: Secondary | ICD-10-CM | POA: Diagnosis not present

## 2016-04-16 DIAGNOSIS — D631 Anemia in chronic kidney disease: Secondary | ICD-10-CM | POA: Diagnosis not present

## 2016-04-16 DIAGNOSIS — Z7982 Long term (current) use of aspirin: Secondary | ICD-10-CM | POA: Diagnosis not present

## 2016-04-16 DIAGNOSIS — Z79899 Other long term (current) drug therapy: Secondary | ICD-10-CM | POA: Insufficient documentation

## 2016-04-16 DIAGNOSIS — N183 Chronic kidney disease, stage 3 (moderate): Secondary | ICD-10-CM | POA: Diagnosis not present

## 2016-04-16 DIAGNOSIS — F419 Anxiety disorder, unspecified: Secondary | ICD-10-CM | POA: Insufficient documentation

## 2016-04-16 DIAGNOSIS — L02221 Furuncle of abdominal wall: Secondary | ICD-10-CM | POA: Insufficient documentation

## 2016-04-16 DIAGNOSIS — E559 Vitamin D deficiency, unspecified: Secondary | ICD-10-CM | POA: Diagnosis not present

## 2016-04-16 DIAGNOSIS — X58XXXA Exposure to other specified factors, initial encounter: Secondary | ICD-10-CM | POA: Diagnosis not present

## 2016-04-16 DIAGNOSIS — Z9884 Bariatric surgery status: Secondary | ICD-10-CM | POA: Diagnosis not present

## 2016-04-16 NOTE — Progress Notes (Addendum)
Meagan Zavala, Meagan Zavala ([id number]) Visit Report for 04/16/2016 Biopsy Details Patient Name: Meagan Zavala, [name] Date of Service: 04/16/2016 8:00 AM Medical Record Number: [id number] Patient Account Number: [id number] Date of Birth/Sex: Jan 30, 1946 (70 y.o. Female) Treating RN: Montey Hora Primary Care Physician: Deborra Medina Other Clinician: Referring Physician: Deborra Medina Treating Physician/Extender: Frann Rider in Treatment: 0 Biopsy Performed for: Wound #1 Right, Medial Abdomen - midline Location(s): Wound Margin Performed By: Physician Christin Fudge, MD Tissue Punch: No Number of Specimens Taken: 3 Specimen Sent To Pathology: Yes Time-Out Taken: Yes Pain Control: Other Other Description: lidocaine 4% Instrument: Blade, Forceps Hemostasis Achieved: Pressure Procedural Pain: 1 Post Procedural Pain: 1 Response to Treatment: Procedure was tolerated well Post Procedure Diagnosis Same as Pre-procedure Electronic Signature(s) Signed: 04/16/2016 9:27:48 AM By: Christin Fudge MD, FACS Entered By: Christin Fudge on 04/16/2016 09:27:48 [name] -------------------------------------------------------------------------------- Chief Complaint Document Details Patient Name: Meagan Austin I. Date of Service: 04/16/2016 8:00 AM Medical Record Number: [id number] Patient Account Number: [id number] Date of Birth/Sex: 06-Sep-1946 (70 y.o. Female) Treating RN: Montey Hora Primary Care Physician: Deborra Medina Other Clinician: Referring Physician: Deborra Medina Treating Physician/Extender: Frann Rider in Treatment: 0 Information Obtained from: Patient Chief Complaint Patient seen for complaints of Non-Healing Wound to the right of from licorice on her abdominal wall for about a month Electronic Signature(s) Signed: 04/16/2016 9:28:05 AM By: Christin Fudge MD, FACS Entered By: Christin Fudge on 04/16/2016 09:28:05 [name]  ([id number]) -------------------------------------------------------------------------------- HPI Details Patient Name: Meagan Austin I. Date of Service: 04/16/2016 8:00 AM Medical Record Number: [id number] Patient Account Number: [id number] Date of Birth/Sex: 07-01-1946 (70 y.o. Female) Treating RN: Montey Hora Primary Care Physician: Deborra Medina Other Clinician: Referring Physician: Deborra Medina Treating Physician/Extender: Frann Rider in Treatment: 0 History of Present Illness Location: umbilical region of the abdominal wall Quality: an open ulcerated area some amount of pain and burning Severity: Patient states wound are getting worse. Duration: Patient has had the wound for < 4 weeks prior to presenting for treatment Timing: Pain in wound is Intermittent (comes and goes Context: The wound appeared gradually over time Modifying Factors: Other treatment(s) tried include:and doxycycline the present time Associated Signs and Symptoms: Patient reports having increase discharge. HPI Description: 70 year old patient who was recently seen by Dr. Tommi Rumps was noted to have a small ulceration on abdomen wall. the patient has had it for about 6 weeks in the periumbilical areaThe patient noted that it has become slightly larger and draining purulent material.was started on doxycycline and given an appointment to the wound center. She does not recall if she had any insect bite or injury her past medical history significant for GERD, hypothyroidism, osteoporosis, chronic kidney disease stage III, chronic low back pain, anemia, vitamin D deficiency and urge incontinence. Electronic Signature(s) Signed: 04/16/2016 9:29:36 AM By: Christin Fudge MD, FACS Previous Signature: 04/16/2016 8:39:20 AM Version By: Christin Fudge MD, FACS Entered By: Christin Fudge on 04/16/2016 09:29:35 [name]  ([id number]) -------------------------------------------------------------------------------- Physical Exam Details Patient Name: Meagan Austin I. Date of Service: 04/16/2016 8:00 AM Medical Record Number: [id number] Patient Account Number: [id number] Date of Birth/Sex: Dec 05, 1945 (70 y.o. Female) Treating RN: Montey Hora Primary Care Physician: Deborra Medina Other Clinician: Referring Physician: Deborra Medina Treating Physician/Extender: Frann Rider in Treatment: 0 Constitutional . Pulse regular. Respirations normal and unlabored. Afebrile. . Eyes Nonicteric. Reactive to light. Ears, Nose, Mouth, and Throat Lips, teeth, and gums WNL.Marland Kitchen Moist mucosa without lesions. Neck supple and nontender. No  palpable supraclavicular or cervical adenopathy. Normal sized without goiter. Respiratory WNL. No retractions.. Cardiovascular Pedal Pulses WNL. No clubbing, cyanosis or edema. Chest Breasts symmetical and no nipple discharge.. Breast tissue WNL, no masses, lumps, or tenderness.. Lymphatic No adneopathy. No adenopathy. No adenopathy. Musculoskeletal Adexa without tenderness or enlargement.. Digits and nails w/o clubbing, cyanosis, infection, petechiae, ischemia, or inflammatory conditions.. Integumentary (Hair, Skin) No suspicious lesions. No crepitus or fluctuance. No peri-wound warmth or erythema. No masses.Marland Kitchen Psychiatric Judgement and insight Intact.. No evidence of depression, anxiety, or agitation.. Notes a low ulcerated area in the region to the right of the midline in the periumbilical area which has a ragged edges and there is no evidence of complication. Wedge biopsies from the edge were taken with the usual precautions and 3 specimens sent for pathology. Electronic Signature(s) Signed: 04/16/2016 9:30:45 AM By: Christin Fudge MD, FACS Entered By: Christin Fudge on 04/16/2016 09:30:45 [name]  ([id number]) -------------------------------------------------------------------------------- Physician Orders Details Patient Name: Meagan Austin I. Date of Service: 04/16/2016 8:00 AM Medical Record Number: [id number] Patient Account Number: [id number] Date of Birth/Sex: 05-01-1946 (70 y.o. Female) Treating RN: Montey Hora Primary Care Physician: Deborra Medina Other Clinician: Referring Physician: Deborra Medina Treating Physician/Extender: Frann Rider in Treatment: 0 Verbal / Phone Orders: Yes Clinician: Montey Hora Read Back and Verified: Yes Diagnosis Coding Wound Cleansing Wound #1 Right,Medial Abdomen - midline o Clean wound with Normal Saline. Anesthetic Wound #1 Right,Medial Abdomen - midline o Topical Lidocaine 4% cream applied to wound bed prior to debridement o Injected 2% Lidocaine without epinephrine prior to debridement Primary Wound Dressing Wound #1 Right,Medial Abdomen - midline o Santyl Ointment Secondary Dressing Wound #1 Right,Medial Abdomen - midline o Boardered Foam Dressing Dressing Change Frequency Wound #1 Right,Medial Abdomen - midline o Change dressing every day. Follow-up Appointments Wound #1 Right,Medial Abdomen - midline o Return Appointment in 1 week. Additional Orders / Instructions Wound #1 Right,Medial Abdomen - midline o Increase protein intake. Medications-please add to medication list. Wound #1 Right,Medial Abdomen - midline o Santyl Enzymatic Ointment - Manuka Honey if Santyl too expensive Laboratory LEAUNA, Meagan Zavala ([id number]) o Tissue Pathology biopsy report (PATH) - abdomen midline oooo LOINC Code: [id number] Convenience Name: Tiss Path Bx report Patient Medications Allergies: No Known Drug Allergies Notifications Medication Indication Start End Santyl 04/16/2016 DOSE 1 - topical 250 unit/gram ointment - 1 ointment topical as directed Electronic Signature(s) Signed: 04/16/2016 4:28:14 PM  By: Christin Fudge MD, FACS Signed: 04/17/2016 11:16:59 AM By: Gretta Cool RN, BSN, Kim RN, BSN Previous Signature: 04/16/2016 10:11:37 AM Version By: Catalina Lunger Previous Signature: 04/16/2016 9:39:19 AM Version By: Christin Fudge MD, FACS Entered By: Gretta Cool RN, BSN, Kim on 04/16/2016 13:08:35 [name] -------------------------------------------------------------------------------- Problem List Details Patient Name: Meagan Austin I. Date of Service: 04/16/2016 8:00 AM Medical Record Number: [id number] Patient Account Number: [id number] Date of Birth/Sex: [birth year] (70 y.o. 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 Date of Service: 04/16/2016  8:00 AM Medical Record Number: [id number] Patient Account Number: [id number] Date of Birth/Sex: May 22, 1946 (70 y.o. Female) Treating RN: Primary Care Physician: Deborra Medina Other Clinician: Referring Physician: Deborra Medina Treating Physician/Extender: Benjaman Pott in Treatment: 0 Subjective Chief Complaint Information obtained from Patient Patient seen for complaints of Non-Healing Wound to the right of from licorice on her  abdominal wall for about a month History of Present Illness (HPI) The following HPI elements were documented for the patient's wound: Location: umbilical region of the abdominal wall Quality: an open ulcerated area some amount of pain and burning Severity: Patient states wound are getting worse. Duration: Patient has had the wound for < 4 weeks prior to presenting for treatment Timing: Pain in wound is Intermittent (comes and goes Context: The wound appeared gradually over time Modifying Factors: Other treatment(s) tried include:and doxycycline the present time Associated Signs and Symptoms: Patient reports having increase discharge. 70 year old patient who was recently seen by Dr. Tommi Rumps was noted to have a small ulceration on abdomen wall. the patient has had it for about 6 weeks in the periumbilical areaThe patient noted that it has become slightly larger and draining purulent material.was started on doxycycline and given an appointment to the wound center. She does not recall if she had any insect bite or injury her past medical history significant for GERD, hypothyroidism, osteoporosis, chronic kidney disease stage III, chronic low back pain, anemia, vitamin D deficiency and urge incontinence. Wound History Patient presents with 1 open wound that has been present for approximately 4 weeks. Patient has been treating wound in the following manner: vaseline/neosporin. Laboratory tests have not been performed in the last month. Patient reportedly has not tested positive for an antibiotic resistant organism. Patient reportedly has not tested positive for osteomyelitis. Patient reportedly has not had testing performed to evaluate circulation in the legs. Patient History Information obtained from Patient. Allergies No Known Drug Allergies Meagan Zavala, [name] ([id number]) Family History Cancer - Siblings, Mother, Father, Heart Disease - Father, Lung Disease - Siblings, No family  history of Hereditary Spherocytosis, Hypertension, Kidney Disease, Seizures, Stroke, Thyroid Problems, Tuberculosis. Social History Former smoker, Marital Status - Single, Alcohol Use - Never, Drug Use - No History, Caffeine Use - Daily. Medical History Eyes Denies history of Cataracts, Glaucoma, Optic Neuritis Ear/Nose/Mouth/Throat Denies history of Chronic sinus problems/congestion, Middle ear problems Hematologic/Lymphatic Denies history of Anemia, Hemophilia, Human Immunodeficiency Virus, Lymphedema, Sickle Cell Disease Respiratory Denies history of Aspiration, Asthma, Chronic Obstructive Pulmonary Disease (COPD), Pneumothorax, Sleep Apnea, Tuberculosis Cardiovascular Denies history of Angina, Arrhythmia, Congestive Heart Failure, Coronary Artery Disease, Deep Vein Thrombosis, Hypertension, Hypotension, Myocardial Infarction, Peripheral Arterial Disease, Peripheral Venous Disease, Phlebitis, Vasculitis Gastrointestinal Denies history of Cirrhosis , Colitis, Crohn s, Hepatitis A, Hepatitis B, Hepatitis C Endocrine Denies history of Type I Diabetes, Type II Diabetes Genitourinary Denies history of End Stage Renal Disease Immunological Denies history of Lupus Erythematosus, Raynaud s, Scleroderma Integumentary (Skin) Denies history of History of Burn, History of pressure wounds Musculoskeletal Denies history of Gout, Rheumatoid Arthritis, Osteoarthritis, Osteomyelitis Neurologic Denies history of Dementia, Neuropathy, Quadriplegia, Paraplegia, Seizure Disorder Oncologic Denies history of Received Chemotherapy, Received Radiation Psychiatric Patient has history of Confinement Anxiety Denies history of Anorexia/bulimia Review of Systems (ROS) Eyes Denies complaints or symptoms of Dry Eyes, Vision Changes, Glasses / Contacts, surgery on eyelids Ear/Nose/Mouth/Throat The patient has no complaints or symptoms. Hematologic/Lymphatic The patient has no complaints or  symptoms. Respiratory The patient has no complaints or symptoms. Meagan Zavala, [name] ([id number])  Cardiovascular Denies complaints or symptoms of Chest pain, LE edema. Gastrointestinal Denies complaints or symptoms of Frequent diarrhea, Nausea, Vomiting. Endocrine Complains or has symptoms of Thyroid disease - synthroid. Denies complaints or symptoms of Hepatitis. Genitourinary Denies complaints or symptoms of Kidney failure/ Dialysis, Incontinence/dribbling. Immunological Denies complaints or symptoms of Hives, Itching. Integumentary (Skin) Complains or has symptoms of Wounds - abd, Breakdown. Denies complaints or symptoms of Bleeding or bruising tendency, Swelling. Musculoskeletal Denies complaints or symptoms of Muscle Pain, Muscle Weakness. Neurologic Denies complaints or symptoms of Numbness/parasthesias, Focal/Weakness. Oncologic The patient has no complaints or symptoms. Psychiatric Denies complaints or symptoms of Anxiety, Claustrophobia. Medications biotin oral 1 1 unspecified oral calcium-vitamin D3-vitamin K oral 1 1 unspecified oral two times daily Fish Oil oral 1 1 capsule oral Stool Softener oral 1 1 tablet oral two times daily vitamin E oral 1 1 unspecified oral clonazepam 0.5 mg tablet oral 1 1 tablet oral two times daily pravastatin 20 mg tablet oral 1 1 tablet oral daily PreserVision AREDS 2 250 mg-200 unit-40 mg-1 mg capsule oral 1 1 capsule oral two times daily Reclast 5 mg/100 mL intravenous piggyback intravenous piggyback intravenous once yearly donepezil 10 mg tablet oral 1 1 tablet oral daily Colace 100 mg capsule oral 1 1 capsule oral two times daily Alive Women's Energy 18 mg-400 mcg-80 mcg tablet oral 1 1 tablet oral twotimes daily hydrocodone 10 mg-acetaminophen 325 mg tablet oral 1 1 tablet oral as needed meloxicam 7.5 mg tablet oral 1 1 tablet oral daily aspirin 81 mg tablet,delayed release oral 1 1 tablet,delayed release (DR/EC) oral  daily omeprazole 40 mg capsule,delayed release oral 1 1 capsule,delayed release(DR/EC) oral daily fluoxetine 40 mg capsule oral 1 1 capsule oral daily in the summer fluoxetine 60 mg tablet oral 1 1 tablet oral daily in the winter trazodone 50 mg tablet oral 1 1 tablet oral daily doxycycline hyclate 100 mg tablet oral 1 1 tablet oral two times daily Synthroid 25 mcg tablet oral 1 1 tablet oral daily oxybutynin chloride ER 10 mg tablet,extended release 24 hr oral 1 1 tablet extended release 24hr oral daily Vitamin B-12 500 mcg tablet oral 1 1 tablet oral daily Meagan Zavala, [name] ([id number]) Vitamin C oral powder oral 1 1 powder oral daily Vitamin D2 50,000 unit capsule oral 1 1 capsule oral weekly Objective Constitutional Pulse regular. Respirations normal and unlabored. Afebrile. Vitals Time Taken: 8:30 AM, Height: 61 in, Source: Stated, Weight: 125 lbs, Source: Stated, BMI: 23.6, Temperature: 97.7 F, Pulse: 60 bpm, Respiratory Rate: 20 breaths/min, Blood Pressure: 96/45 mmHg. Eyes Nonicteric. Reactive to light. Ears, Nose, Mouth, and Throat Lips, teeth, and gums WNL.Marland Kitchen Moist mucosa without lesions. Neck supple and nontender. No palpable supraclavicular or cervical adenopathy. Normal sized without goiter. Respiratory WNL. No retractions.. Cardiovascular Pedal Pulses WNL. No clubbing, cyanosis or edema. Chest Breasts symmetical and no nipple discharge.. Breast tissue WNL, no masses, lumps, or tenderness.. Lymphatic No adneopathy. No adenopathy. No adenopathy. Musculoskeletal Adexa without tenderness or enlargement.. Digits and nails w/o clubbing, cyanosis, infection, petechiae, ischemia, or inflammatory conditions.Marland Kitchen Psychiatric Judgement and insight Intact.. No evidence of depression, anxiety, or agitation.. General Notes: a low ulcerated area in the region to the right of the midline in the periumbilical area which has a ragged edges and there is no evidence of complication. Wedge  biopsies from the edge were taken with the usual precautions and 3 specimens sent for pathology. Integumentary (Hair, Skin) Meagan Zavala, [name] ([id number]) No suspicious lesions. No crepitus or  fluctuance. No peri-wound warmth or erythema. No masses.. Wound #1 status is Open. Original cause of wound was Gradually Appeared. The wound is located on the Right,Medial Abdomen - midline. The wound measures 0.5cm length x 2.5cm width x 0.3cm depth; 0.982cm^2 area and 0.295cm^3 volume. The wound is limited to skin breakdown. There is no tunneling or undermining noted. There is a medium amount of serous drainage noted. The wound margin is flat and intact. There is no granulation within the wound bed. There is a large (67-100%) amount of necrotic tissue within the wound bed including Adherent Slough. The periwound skin appearance exhibited: Moist, Erythema. The periwound skin appearance did not exhibit: Callus, Crepitus, Excoriation, Fluctuance, Friable, Induration, Localized Edema, Rash, Scarring, Dry/Scaly, Maceration, Atrophie Blanche, Cyanosis, Ecchymosis, Hemosiderin Staining, Mottled, Pallor, Rubor. The surrounding wound skin color is noted with erythema which is circumferential. 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 I would like to rule out a skin carcinoma and have taken wedge biopsies with the usual precautions. In the meanwhile we will treat her with Santyl ointment locally and if this is too expensive we will use Mehdi honey on a daily basis and will see her back next week Procedures Wound #1 Wound  #1 is an Abscess located on the Right, Medial Abdomen - midline . There was a biopsy performed by Christin Fudge, MD. There was a biopsy performed on Wound Margin. The skin was cleansed and prepped with anti-septic followed by pain control using Other. Tissue was removed at its base with the following instrument(s): Blade and Forceps and sent to pathology. A time out was conducted prior to the start of the procedure. The procedure was tolerated well with a pain level of 1 throughout and a pain level of 1 following the procedure. Post procedure Diagnosis Wound #1: Same as Pre-Procedure Meagan Zavala, [name] ([id number]) Plan Wound Cleansing: Wound #1 Right,Medial Abdomen - midline: Clean wound with Normal Saline. Anesthetic: Wound #1 Right,Medial Abdomen - midline: Topical Lidocaine 4% cream applied to wound bed prior to debridement Injected 2% Lidocaine without epinephrine prior to debridement Primary Wound Dressing: Wound #1 Right,Medial Abdomen - midline: Santyl Ointment Secondary Dressing: Wound #1 Right,Medial Abdomen - midline: Boardered Foam Dressing Dressing Change Frequency: Wound #1 Right,Medial Abdomen - midline: Change dressing every day. Follow-up Appointments: Wound #1 Right,Medial Abdomen - midline: Return Appointment in 1 week. Additional Orders / Instructions: Wound #1 Right,Medial Abdomen - midline: Increase protein intake. Medications-please add to medication list.: Wound #1 Right,Medial Abdomen - midline: Santyl Enzymatic Ointment - Manuka Honey if Santyl too expensive Laboratory ordered were: [facility] Bx report - abdomen midline The following medication(s) was prescribed: Santyl topical 250 unit/gram ointment 1 1 ointment topical as directed starting 04/16/2016 Follow-Up Appointments: A Patient Clinical Summary of Care was provided to [facility] Meagan Zavala, Meagan Zavala ([id number]) 71 year old patient has had a remote history of gastric bypass surgery in 1979 and now has a  periumbilical ulcerated area which came up spontaneously without being a pustule or an insect bite. I would like to rule out a skin carcinoma and have taken wedge biopsies with the usual precautions. In the meanwhile we will treat her with [facility]  ointment locally and if this is too expensive we will use Mehdi honey on a daily basis and will see her back next week Electronic Signature(s) Signed: 04/17/2016 12:23:11 PM By: Judene Companion MD Previous Signature: 04/16/2016 9:39:47 AM Version By: Christin Fudge MD, FACS Previous Signature: 04/16/2016 9:32:25 AM Version By: Christin Fudge MD, FACS Entered By: Judene Companion on 04/17/2016 12:23:11 [name] -------------------------------------------------------------------------------- ROS/PFSH Details Patient Name: Meagan Austin I. Date of Service: 04/16/2016 8:00 AM Medical Record Number: [id number] Patient Account Number: [id number] Date of Birth/Sex: 05/05/1946 (70 y.o. Female) Treating RN: Montey Hora Primary Care Physician: Deborra Medina Other Clinician: Referring Physician: Deborra Medina Treating Physician/Extender: Frann Rider in Treatment: 0 Information Obtained From Patient Wound History Do you currently have one or more open woundso Yes How many open wounds do you currently haveo 1 Approximately how long have you had your woundso 4 weeks How have you been treating your wound(s) until nowo vaseline/neosporin Has your wound(s) ever healed and then re-openedo No Have you had any lab work done in the past montho No Have you tested positive for an antibiotic resistant organism (MRSA, VRE)o No Have you tested positive for osteomyelitis (bone infection)o No Have you had any tests for circulation on your legso No Eyes Complaints and Symptoms: Negative for: Dry Eyes; Vision Changes; Glasses / Contacts Review of System Notes: surgery on eyelids Medical History: Negative for: Cataracts; Glaucoma; Optic  Neuritis Ear/Nose/Mouth/Throat Complaints and Symptoms: No Complaints or Symptoms Complaints and Symptoms: Negative for: Difficult clearing ears; Sinusitis Medical History: Negative for: Chronic sinus problems/congestion; Middle ear problems Hematologic/Lymphatic Complaints and Symptoms: No Complaints or Symptoms Complaints and Symptoms: Negative for: Bleeding / Clotting Disorders; Human Immunodeficiency Virus Meagan Zavala, [name] ([id number]) Medical History: Negative for: Anemia; Hemophilia; Human Immunodeficiency Virus; Lymphedema; Sickle Cell Disease Respiratory Complaints and Symptoms: No Complaints or Symptoms Complaints and Symptoms: Negative for: Chronic or frequent coughs; Shortness of Breath Medical History: Negative for: Aspiration; Asthma; Chronic Obstructive Pulmonary Disease (COPD); Pneumothorax; Sleep Apnea; Tuberculosis Cardiovascular Complaints and Symptoms: Negative for: Chest pain; LE edema Medical History: Negative for: Angina; Arrhythmia; Congestive Heart Failure; Coronary Artery Disease; Deep Vein Thrombosis; Hypertension; Hypotension; Myocardial Infarction; Peripheral Arterial Disease; Peripheral Venous Disease; Phlebitis; Vasculitis Gastrointestinal Complaints and Symptoms: Negative for: Frequent diarrhea; Nausea; Vomiting Medical History: Negative for: Cirrhosis ; Colitis; Crohnos; Hepatitis A; Hepatitis B; Hepatitis C Endocrine Complaints and Symptoms: Positive for: Thyroid disease - synthroid Negative for: Hepatitis Medical History: Negative for: Type I Diabetes; Type II Diabetes Genitourinary Complaints and Symptoms: Negative for: Kidney failure/ Dialysis; Incontinence/dribbling Medical History: Negative for: End Stage Renal Disease Immunological Meagan Zavala, [name] ([id number]) Complaints and Symptoms: Negative for: Hives; Itching Medical History: Negative for: Lupus Erythematosus; Raynaudos; Scleroderma Integumentary (Skin) Complaints and  Symptoms: Positive for: Wounds - abd; Breakdown Negative for: Bleeding or bruising tendency; Swelling Medical History: Negative for: History of Burn; History of pressure wounds Musculoskeletal Complaints and Symptoms: Negative for: Muscle Pain; Muscle Weakness Medical History: Negative for: Gout; Rheumatoid Arthritis; Osteoarthritis; Osteomyelitis Neurologic Complaints and Symptoms: Negative for: Numbness/parasthesias; Focal/Weakness Medical History: Negative for: Dementia; Neuropathy; Quadriplegia; Paraplegia; Seizure Disorder Psychiatric Complaints and Symptoms: Negative for: Anxiety; Claustrophobia Medical History: Positive for: Confinement Anxiety Negative for: Anorexia/bulimia Oncologic Complaints and Symptoms: No Complaints or Symptoms Medical History: Negative for: Received Chemotherapy; Received Radiation Immunizations Immunization Notes: date unknown Meagan Zavala, Meagan Zavala ([id number]) Family and Social History Cancer: Yes - Siblings, Mother, Father; Heart Disease: Yes - Father; Hereditary Spherocytosis: No; Hypertension: No; Kidney Disease: No; Lung  Disease: Yes - Siblings; Seizures: No; Stroke: No; Thyroid Problems: No; Tuberculosis: No; Former smoker; Marital Status - Single; Alcohol Use: Never; Drug Use: No History; Caffeine Use: Daily; Financial Concerns: No; Food, Clothing or Shelter Needs: No; Support System Lacking: No; Advanced Directives: No; Patient does not want information on Advanced Directives; Do not resuscitate: No; Living Will: Yes (Not Provided) Physician Affirmation I have reviewed and agree with the above information. Electronic Signature(s) Signed: 04/17/2016 3:56:11 PM By: Judene Companion MD Signed: 04/20/2016 4:20:18 PM By: Christin Fudge MD, FACS Signed: 04/20/2016 4:57:30 PM By: Montey Hora Previous Signature: 04/16/2016 4:28:14 PM Version By: Christin Fudge MD, FACS Previous Signature: 04/16/2016 5:32:06 PM Version By: Montey Hora Entered By:  Judene Companion on 04/17/2016 12:24:44 Meagan Austin IMarland Kitchen ([id number]) -------------------------------------------------------------------------------- SuperBill Details Patient Name: Meagan Austin I. Date of Service: 04/16/2016 Medical Record Number: [id number] Patient Account Number: [id number] Date of Birth/Sex: [birth year] (70 y.o. 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 Facility Procedures CPT4: Description Modifier Quantity Code [id number] - WOUND CARE VISIT-LEV 3 EST PT 1 CPT4: [id number] - BIOPSY SKIN GREATER THAN ONE 1 ICD-10 Description Diagnosis 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 S31.105A Unspecified open wound of abdominal wall, periumbilic region without penetration into peritoneal cavity, initial encounter  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 ([id number]) Electronic Signature(s) Signed: 04/16/2016 10:11:37 AM By: Catalina Lunger Signed: 04/16/2016 4:28:14 PM By: Christin Fudge MD, FACS Previous Signature: 04/16/2016 9:33:04 AM Version By: Christin Fudge MD, FACS Entered By: Catalina Lunger on 04/16/2016 09:59:21

## 2016-04-17 LAB — SURGICAL PATHOLOGY

## 2016-04-17 NOTE — Progress Notes (Signed)
Meagan Zavala ([id number]) Visit Report for 04/16/2016 Abuse/Suicide Risk Screen Details Patient Name: Meagan Zavala, [name] Date of Service: 04/16/2016 8:00 AM Medical Record Number: [id number] Patient Account Number: [id number] Date of Birth/Sex: Feb 15, 1946 (70 y.o. Female) Treating RN: Montey Hora Primary Care Physician: Deborra Medina Other Clinician: Referring Physician: Deborra Medina Treating Physician/Extender: Frann Rider in Treatment: 0 Abuse/Suicide Risk Screen Items Answer ABUSE/SUICIDE RISK SCREEN: Has anyone close to you tried to hurt or harm you recentlyo No Do you feel uncomfortable with anyone in your familyo No Has anyone forced you do things that you didnot want to doo No Do you have any thoughts of harming yourselfo No Patient displays signs or symptoms of abuse and/or neglect. No Electronic Signature(s) Signed: 04/16/2016 10:11:37 AM By: Catalina Lunger Signed: 04/16/2016 5:32:06 PM By: Montey Hora Entered By: Catalina Lunger on 04/16/2016 08:43:50 [name] -------------------------------------------------------------------------------- Activities of Daily Living Details Patient Name: Meagan Zavala I. Date of Service: 04/16/2016 8:00 AM Medical Record Number: [id number] Patient Account Number: [id number] Date of Birth/Sex: 20-Jul-1946 (70 y.o. Female) Treating RN: Montey Hora Primary Care Physician: Deborra Medina Other Clinician: Referring Physician: Deborra Medina Treating Physician/Extender: Frann Rider in Treatment: 0 Activities of Daily Living Items Answer Activities of Daily Living (Please select one for each item) Drive Automobile Completely Able Take Medications Completely Able Use Telephone Completely Able Care for Appearance Completely Able Use Toilet Completely Able Bath / Shower Completely Able Dress Self Completely Able Feed Self Completely Able Walk Completely Able Get In / Out Bed Completely  Able Housework Completely Able Prepare Meals Completely Able Handle Money Completely Able Shop for Self Completely Able Electronic Signature(s) Signed: 04/16/2016 10:11:37 AM By: Catalina Lunger Signed: 04/16/2016 5:32:06 PM By: Montey Hora Entered By: Catalina Lunger on 04/16/2016 08:44:14 [name] -------------------------------------------------------------------------------- Education Assessment Details Patient Name: Meagan Zavala I. Date of Service: 04/16/2016 8:00 AM Medical Record Number: [id number] Patient Account Number: [id number] Date of Birth/Sex: [birth year] (70 y.o. Female) Treating RN: Montey Hora Primary Care Physician: Deborra Medina Other Clinician: Referring Physician: Deborra Medina Treating Physician/Extender: Frann Rider in Treatment: 0 Primary Learner Assessed: Patient Learning Preferences/Education Level/Primary Language Learning Preference: Explanation, Demonstration Highest Education Level: High School Cognitive Barrier Assessment/Beliefs Language Barrier: No Translator Needed: No Memory Deficit: No Emotional Barrier: No Cultural/Religious Beliefs Affecting Medical No Care: Physical Barrier Assessment Impaired Vision: No Impaired Hearing: No Decreased Hand dexterity: No Knowledge/Comprehension Assessment Knowledge Level: High Comprehension Level: High Ability to understand written High instructions: Ability to understand verbal High instructions: Motivation Assessment Anxiety Level: Calm Cooperation: Cooperative Education Importance: Acknowledges Need Interest in Health Problems: Asks Questions Perception: Coherent Willingness to Engage in Self- High Management Activities: Readiness to Engage in Self- High Management Activities: Electronic Signature(s) Signed: 04/16/2016 10:11:37 AM By: Conni Elliot IMarland Kitchen ([id number]) Signed: 04/16/2016 5:32:06 PM By: Montey Hora Entered By: Catalina Lunger  on 04/16/2016 08:45:00 [name] -------------------------------------------------------------------------------- Fall Risk Assessment Details Patient Name: Meagan Zavala I. Date of Service: 04/16/2016 8:00 AM Medical Record Number: [id number] Patient Account Number: [id number] Date of Birth/Sex: [birth year] (70 y.o. Female) Treating RN: Montey Hora Primary Care Physician: Deborra Medina Other Clinician: Referring Physician: Deborra Medina Treating Physician/Extender: Frann Rider in Treatment: 0 Fall Risk Assessment Items Have you had 2 or more falls in the last 12 monthso 0 No Have you had any fall that resulted in injury in the last 12 monthso 0 No FALL RISK ASSESSMENT: History of falling - immediate or within 3  months 0 No Secondary diagnosis 0 No Ambulatory aid None/bed rest/wheelchair/nurse 0 Yes Crutches/cane/walker 0 No Furniture 0 No IV Access/Saline Lock 0 No Gait/Training Normal/bed rest/immobile 0 Yes Weak 0 No Impaired 0 No Mental Status Oriented to own ability 0 Yes Electronic Signature(s) Signed: 04/16/2016 10:11:37 AM By: Catalina Lunger Signed: 04/16/2016 5:32:06 PM By: Montey Hora Entered By: Catalina Lunger on 04/16/2016 08:46:22 [name] ([id number]) -------------------------------------------------------------------------------- Foot Assessment Details Patient Name: Meagan Zavala I. Date of Service: 04/16/2016 8:00 AM Medical Record Number: [id number] Patient Account Number: [id number] Date of Birth/Sex: 06-Oct-1945 (70 y.o. Female) Treating RN: Montey Hora Primary Care Physician: Deborra Medina Other Clinician: Referring Physician: Deborra Medina Treating Physician/Extender: Frann Rider in Treatment: 0 Foot Assessment Items Site Locations + = Sensation present, - = Sensation absent, C = Callus, U = Ulcer R = Redness, W = Warmth, M = Maceration, PU = Pre-ulcerative lesion F = Fissure, S = Swelling, D =  Dryness Assessment Right: Left: Other Deformity: No No Prior Foot Ulcer: No No Prior Amputation: No No Charcot Joint: No No Ambulatory Status: Gait: Electronic Signature(s) Signed: 04/16/2016 10:11:37 AM By: Catalina Lunger Signed: 04/16/2016 5:32:06 PM By: Montey Hora Entered By: Catalina Lunger on 04/16/2016 09:47:06 [name] ([id number]) -------------------------------------------------------------------------------- Nutrition Risk Assessment Details Patient Name: Meagan Zavala I. Date of Service: 04/16/2016 8:00 AM Medical Record Number: [id number] Patient Account Number: [id number] Date of Birth/Sex: [birth year] (70 y.o. Female) Treating RN: Montey Hora Primary Care Physician: Deborra Medina Other Clinician: Referring Physician: Deborra Medina Treating Physician/Extender: Frann Rider in Treatment: 0 Height (in): 61 Weight (lbs): 125 Body Mass Index (BMI): 23.6 Nutrition Risk Assessment Items NUTRITION RISK SCREEN: I have an illness or condition that made me change the kind and/or 0 No amount of food I eat I eat fewer than two meals per day 3 Yes I eat few fruits and vegetables, or milk products 2 Yes I have three or more drinks of beer, liquor or wine almost every day 0 No I have tooth or mouth problems that make it hard for me to eat 0 No I don't always have enough money to buy the food I need 0 No I eat alone most of the time 0 No I take three or more different prescribed or over-the-counter drugs a 1 Yes day Without wanting to, I have lost or gained 10 pounds in the last six 0 No months I am not always physically able to shop, cook and/or feed myself 0 No Nutrition Protocols Good Risk Protocol Moderate Risk Protocol Provide education on High Risk Proctocol 0 Electronic Signature(s) Signed: 04/16/2016 10:11:37 AM By: Catalina Lunger Signed: 04/16/2016 5:32:06 PM By: Montey Hora Entered By: Catalina Lunger on 04/16/2016 08:47:33

## 2016-04-17 NOTE — Progress Notes (Addendum)
MALACHI, ALDERETE ([id number]) Visit Report for 04/16/2016 Allergy List Details Patient Name: Meagan Zavala, [name] Date of Service: 04/16/2016 8:00 AM Medical Record Number: [id number] Patient Account Number: [id number] Date of Birth/Sex: [birth year] (70 y.o. Female) Treating RN: Montey Hora Primary Care Physician: Deborra Medina Other Clinician: Referring Physician: Deborra Medina Treating Physician/Extender: Frann Rider in Treatment: 0 Allergies Active Allergies No Known Drug Allergies Allergy Notes Electronic Signature(s) Signed: 04/17/2016 3:56:11 PM By: Judene Companion MD Previous Signature: 04/16/2016 10:11:37 AM Version By: Catalina Lunger Entered By: Judene Companion on 04/17/2016 12:24:58 Meagan Zavala ([id number]) -------------------------------------------------------------------------------- Arrival Information Details Patient Name: Meagan Austin I. Date of Service: 04/16/2016 8:00 AM Medical Record Number: [id number] Patient Account Number: [id number] Date of Birth/Sex: Oct 19, 1945 (70 y.o. Female) Treating RN: Montey Hora Primary Care Physician: Deborra Medina Other Clinician: Referring Physician: Deborra Medina Treating Physician/Extender: Frann Rider in Treatment: 0 Visit Information Patient Arrived: Ambulatory Arrival Time: 08:29 Accompanied By: self Transfer Assistance: None Patient Identification Verified: Yes Secondary Verification Process Yes Completed: Patient Requires Transmission-Based No Precautions: Patient Has Alerts: No Electronic Signature(s) Signed: 04/17/2016 3:56:11 PM By: Judene Companion MD Previous Signature: 04/16/2016 10:11:37 AM Version By: Catalina Lunger Entered By: Judene Companion on 04/17/2016 12:25:19 Meagan Zavala ([id number]) -------------------------------------------------------------------------------- Clinic Level of Care Assessment Details Patient Name: Meagan Austin I. Date of Service: 04/16/2016 8:00 AM Medical  Record Number: [id number] Patient Account Number: [id number] Date of Birth/Sex: August 28, 1946 (70 y.o. Female) Treating RN: Montey Hora Primary Care Physician: Deborra Medina Other Clinician: Referring Physician: Deborra Medina Treating Physician/Extender: Frann Rider in Treatment: 0 Clinic Level of Care Assessment Items TOOL 1 Quantity Score X - Use when EandM and Procedure is performed on INITIAL visit 1 0 ASSESSMENTS - Nursing Assessment / Reassessment X - General Physical Exam (combine w/ comprehensive assessment (listed just 1 20 below) when performed on new pt. evals) X - Comprehensive Assessment (HX, ROS, Risk Assessments, Wounds Hx, etc.) 1 25 ASSESSMENTS - Wound and Skin Assessment / Reassessment []  - Dermatologic / Skin Assessment (not related to wound area) 0 ASSESSMENTS - Ostomy and/or Continence Assessment and Care []  - Incontinence Assessment and Management 0 []  - Ostomy Care Assessment and Management (repouching, etc.) 0 PROCESS - Coordination of Care X - Simple Patient / Family Education for ongoing care 1 15 []  - Complex (extensive) Patient / Family Education for ongoing care 0 X - Staff obtains Consents, Records, Test Results / Process Orders 1 10 []  - Staff telephones HHA, Nursing Homes / Clarify orders / etc 0 []  - Routine Transfer to another Facility (non-emergent condition) 0 []  - Routine Hospital Admission (non-emergent condition) 0 X - New Admissions / Biomedical engineer / Ordering NPWT, Apligraf, etc. 1 15 []  - Emergency Hospital Admission (emergent condition) 0 PROCESS - Special Needs []  - Pediatric / Minor Patient Management 0 []  - Isolation Patient Management 0 [name], [name] ([id number]) []  - Hearing / Language / Visual special needs 0 []  - Assessment of Community assistance (transportation, D/C planning, etc.) 0 []  - Additional assistance / Altered mentation 0 []  - Support Surface(s) Assessment (bed, cushion, seat, etc.) 0 INTERVENTIONS  - Miscellaneous []  - External ear exam 0 []  - Patient Transfer (multiple staff / Civil Service fast streamer / Similar devices) 0 []  - Simple Staple / Suture removal (25 or less) 0 []  - Complex Staple / Suture removal (26 or more) 0 []  - Hypo/Hyperglycemic Management (do not check if billed separately) 0 []  - Ankle / Brachial Index (ABI) - do  not check if billed separately 0 Has the patient been seen at the hospital within the last three years: Yes Total Score: 85 Level Of Care: New/Established - Level 3 Electronic Signature(s) Signed: 04/16/2016 10:11:37 AM By: Catalina Lunger Entered By: Catalina Lunger on 04/16/2016 09:58:43 Meagan Zavala ([id number]) -------------------------------------------------------------------------------- Encounter Discharge Information Details Patient Name: Meagan Austin I. Date of Service: 04/16/2016 8:00 AM Medical Record Number: [id number] Patient Account Number: [id number] Date of Birth/Sex: 04/13/1946 (70 y.o. Female) Treating RN: Montey Hora Primary Care Physician: Deborra Medina Other Clinician: Referring Physician: Deborra Medina Treating Physician/Extender: Frann Rider in Treatment: 0 Encounter Discharge Information Items Discharge Pain Level: 0 Discharge Condition: Stable Ambulatory Status: Ambulatory Discharge Destination: Home Transportation: Private Auto Accompanied By: self Schedule Follow-up Appointment: No Medication Reconciliation completed and provided to Patient/Care No Ciin Brazzel: Provided on Clinical Summary of Care: 04/16/2016 Form Type Recipient Paper Patient [facility] Electronic Signature(s) Signed: 04/16/2016 10:39:04 AM By: Catalina Lunger Previous Signature: 04/16/2016 9:34:13 AM Version By: Ruthine Dose Entered By: Catalina Lunger on 04/16/2016 10:37:27 Meagan Zavala ([id number]) -------------------------------------------------------------------------------- Lower Extremity Assessment Details Patient Name: Meagan Austin I. Date  of Service: 04/16/2016 8:00 AM Medical Record Number: [id number] Patient Account Number: [id number] Date of Birth/Sex: 1946/07/10 (70 y.o. Female) Treating RN: Montey Hora Primary Care Physician: Deborra Medina Other Clinician: Referring Physician: Deborra Medina Treating Physician/Extender: Frann Rider in Treatment: 0 Electronic Signature(s) Signed: 04/17/2016 3:56:11 PM By: Judene Companion MD Signed: 04/20/2016 4:57:30 PM By: Montey Hora Entered By: Judene Companion on 04/17/2016 12:25:11 Meagan Austin IMarland Kitchen ([id number]) -------------------------------------------------------------------------------- Multi Wound Chart Details Patient Name: Meagan Austin I. Date of Service: 04/16/2016 8:00 AM Medical Record Number: [id number] Patient Account Number: [id number] Date of Birth/Sex: [birth year] (70 y.o. Female) Treating RN: Montey Hora Primary Care Physician: Deborra Medina Other Clinician: Referring Physician: Deborra Medina Treating Physician/Extender: Frann Rider in Treatment: 0 Vital Signs Height(in): 61 Pulse(bpm): 60 Weight(lbs): 125 Blood Pressure 96/45 (mmHg): Body Mass Index(BMI): 24 Temperature(F): 97.7 Respiratory Rate 20 (breaths/min): Photos: [N/A:N/A] Wound Location: Right Abdomen - midline - N/A N/A Medial Wounding Event: Gradually Appeared N/A N/A Primary Etiology: Abscess N/A N/A Comorbid History: Confinement Anxiety N/A N/A Date Acquired: 02/27/2016 N/A N/A Weeks of Treatment: 0 N/A N/A Wound Status: Open N/A N/A Measurements L x W x D 0.5x2.5x0.3 N/A N/A (cm) Area (cm) : 0.982 N/A N/A Volume (cm) : 0.295 N/A N/A Classification: Partial Thickness N/A N/A Exudate Amount: Medium N/A N/A Exudate Type: Serous N/A N/A Exudate Color: amber N/A N/A Wound Margin: Flat and Intact N/A N/A Granulation Amount: None Present (0%) N/A N/A Necrotic Amount: Large (67-100%) N/A N/A Exposed Structures: Fascia: No N/A N/A Fat: No Tendon: No Muscle:  No Meagan Zavala, [name] ([id number]) Joint: No Bone: No Limited to Skin Breakdown Epithelialization: Small (1-33%) N/A N/A Periwound Skin Texture: Edema: No N/A N/A Excoriation: No Induration: No Callus: No Crepitus: No Fluctuance: No Friable: No Rash: No Scarring: No Periwound Skin Moist: Yes N/A N/A Moisture: Maceration: No Dry/Scaly: No Periwound Skin Color: Erythema: Yes N/A N/A Atrophie Blanche: No Cyanosis: No Ecchymosis: No Hemosiderin Staining: No Mottled: No Pallor: No Rubor: No Erythema Location: Circumferential N/A N/A Tenderness on No N/A N/A Palpation: Wound Preparation: Ulcer Cleansing: N/A N/A Rinsed/Irrigated with Saline Topical Anesthetic Applied: Other: lidocaine 4% Procedures Performed: Biopsy N/A N/A Treatment Notes Electronic Signature(s) Signed: 04/16/2016 10:11:37 AM By: Catalina Lunger Entered By: Catalina Lunger on 04/16/2016 09:56:08 Meagan Zavala ([id number]) -------------------------------------------------------------------------------- [place] Details Patient Name: Meagan Austin I. Date of  Service: 04/16/2016 8:00 AM Medical Record Number: [id number] Patient Account Number: [id number] Date of Birth/Sex: 07-14-1946 (70 y.o. Female) Treating RN: Montey Hora Primary Care Physician: Deborra Medina Other Clinician: Referring Physician: Deborra Medina Treating Physician/Extender: Frann Rider in Treatment: 0 Active Inactive Malignancy/Atypical Etiology Nursing Diagnoses: Knowledge deficit related to disease process and management of atypical ulcer etiology Goals: Patient/caregiver will verbalize understanding of disease process and disease management of atypical ulcer etiology Date Initiated: 04/16/2016 Goal Status: Active Interventions: Assess patient and family medical history for signs and symptoms of malignancy/atypical etiology upon admission Provide education on atypical ulcer etiologies Provide  education on malignant ulcerations Treatment Activities: Biopsy for Pathology : 04/16/2016 Test ordered outside of clinic : 04/16/2016 Notes: Nutrition Nursing Diagnoses: Potential for alteratiion in Nutrition/Potential for imbalanced nutrition Goals: Patient/caregiver agrees to and verbalizes understanding of need to use nutritional supplements and/or vitamins as prescribed Date Initiated: 04/16/2016 Goal Status: Active Interventions: Assess patient nutrition upon admission and as needed per policy Provide education on nutrition Meagan Zavala, [name] ([id number]) Notes: Orientation to the Wound Care Program Nursing Diagnoses: Knowledge deficit related to the wound healing center program Goals: Patient/caregiver will verbalize understanding of the [place] Program Date Initiated: 04/16/2016 Goal Status: Active Interventions: Provide education on orientation to the wound center Notes: Wound/Skin Impairment Nursing Diagnoses: Impaired tissue integrity Goals: Patient/caregiver will verbalize understanding of skin care regimen Date Initiated: 04/16/2016 Goal Status: Active Ulcer/skin breakdown will heal within 14 weeks Date Initiated: 04/16/2016 Goal Status: Active Interventions: Assess patient/caregiver ability to obtain necessary supplies Assess patient/caregiver ability to perform ulcer/skin care regimen upon admission and as needed Assess ulceration(s) every visit Treatment Activities: Skin care regimen initiated : 04/16/2016 Topical wound management initiated : 04/16/2016 Notes: Electronic Signature(s) Signed: 04/16/2016 10:39:04 AM By: Catalina Lunger Signed: 04/16/2016 5:32:06 PM By: Montey Hora Previous Signature: 04/16/2016 10:11:37 AM Version By: Catalina Lunger Entered By: Catalina Lunger on 04/16/2016 10:35:44 Meagan Austin I. ([id number]) Meagan Zavala, Meagan Zavala ([id number]) -------------------------------------------------------------------------------- Pain  Assessment Details Patient Name: Meagan Austin I. Date of Service: 04/16/2016 8:00 AM Medical Record Number: [id number] Patient Account Number: [id number] Date of Birth/Sex: [birth year] (70 y.o. Female) Treating RN: Montey Hora Primary Care Physician: Deborra Medina Other Clinician: Referring Physician: Deborra Medina Treating Physician/Extender: Frann Rider in Treatment: 0 Active Problems Location of Pain Severity and Description of Pain Patient Has Paino No Site Locations Rate the pain. Current Pain Level: 0 Pain Management and Medication Current Pain Management: Electronic Signature(s) Signed: 04/17/2016 3:56:11 PM By: Judene Companion MD Signed: 04/20/2016 4:57:30 PM By: Montey Hora Previous Signature: 04/16/2016 10:11:37 AM Version By: Catalina Lunger Previous Signature: 04/16/2016 5:32:06 PM Version By: Montey Hora Entered By: Judene Companion on 04/17/2016 12:25:24 Meagan Zavala ([id number]) -------------------------------------------------------------------------------- Patient/Caregiver Education Details Patient Name: Meagan Austin I. Date of Service: 04/16/2016 8:00 AM Medical Record Number: [id number] Patient Account Number: [id number] Date of Birth/Gender: [birth year] (70 y.o. Female) Treating RN: Montey Hora Primary Care Physician: Deborra Medina Other Clinician: Referring Physician: Deborra Medina Treating Physician/Extender: Frann Rider in Treatment: 0 Education Assessment Education Provided To: Patient Education Topics Provided Basic Hygiene: Methods: Explain/Verbal Responses: Return demonstration correctly Infection: Handouts: Infection Prevention and Management Methods: Explain/Verbal Responses: State content correctly Welcome To [facility]: Handouts: Welcome To [facility] Methods: Explain/Verbal Responses: State content correctly Wound/Skin Impairment: Handouts: Caring for Your Ulcer, Skin Care Do's and  Dont's Methods: Explain/Verbal Responses: State content correctly Electronic Signature(s) Signed: 04/16/2016 10:39:04 AM By: Catalina Lunger Entered By: Catalina Lunger  on 04/16/2016 10:38:41 Meagan Zavala, Meagan Zavala ([id number]) -------------------------------------------------------------------------------- Wound Assessment Details Patient Name: Meagan Zavala, [name] Date of Service: 04/16/2016 8:00 AM Medical Record Number: [id number] Patient Account Number: [id number] Date of Birth/Sex: Aug 06, 1946 (70 y.o. Female) Treating RN: Montey Hora Primary Care Physician: Deborra Medina Other Clinician: Referring Physician: Deborra Medina Treating Physician/Extender: Frann Rider in Treatment: 0 Wound Status Wound Number: 1 Primary Etiology: Abscess Wound Location: Right Abdomen - midline - Wound Status: Open Medial Comorbid History: Confinement Anxiety Wounding Event: Gradually Appeared Date Acquired: 02/27/2016 Weeks Of Treatment: 0 Clustered Wound: No Photos Wound Measurements Length: (cm) 0.5 Width: (cm) 2.5 Depth: (cm) 0.3 Area: (cm) 0.982 Volume: (cm) 0.295 % Reduction in Area: % Reduction in Volume: Epithelialization: Small (1-33%) Tunneling: No Undermining: No Wound Description Classification: Partial Thickness Wound Margin: Flat and Intact Exudate Amount: Medium Exudate Type: Serous Exudate Color: amber Foul Odor After Cleansing: No Wound Bed Granulation Amount: None Present (0%) Exposed Structure Necrotic Amount: Large (67-100%) Fascia Exposed: No Necrotic Quality: Adherent Slough Fat Layer Exposed: No Tendon Exposed: No Meagan Zavala, [name] ([id number]) Muscle Exposed: No Joint Exposed: No Bone Exposed: No Limited to Skin Breakdown Periwound Skin Texture Texture Color No Abnormalities Noted: No No Abnormalities Noted: No Callus: No Atrophie Blanche: No Crepitus: No Cyanosis: No Excoriation: No Ecchymosis: No Fluctuance: No Erythema: Yes Friable:  No Erythema Location: Circumferential Induration: No Hemosiderin Staining: No Localized Edema: No Mottled: No Rash: No Pallor: No Scarring: No Rubor: No Moisture No Abnormalities Noted: No Dry / Scaly: No Maceration: No Moist: Yes Wound Preparation Ulcer Cleansing: Rinsed/Irrigated with Saline Topical Anesthetic Applied: Other: lidocaine 4%, Treatment Notes Wound #1 (Right, Medial Abdomen - midline) 1. Cleansed with: Clean wound with Normal Saline 2. Anesthetic Topical Lidocaine 4% cream to wound bed prior to debridement 4. Dressing Applied: Santyl Ointment 5. Secondary Dressing Applied Bordered Foam Dressing Electronic Signature(s) Signed: 04/16/2016 10:11:37 AM By: Catalina Lunger Signed: 04/16/2016 5:32:06 PM By: Montey Hora Entered By: Catalina Lunger on 04/16/2016 08:51:12 Meagan Zavala ([id number]) -------------------------------------------------------------------------------- Vitals Details Patient Name: Meagan Austin I. Date of Service: 04/16/2016 8:00 AM Medical Record Number: [id number] Patient Account Number: [id number] Date of Birth/Sex: [birth year] (70 y.o. Female) Treating RN: Montey Hora Primary Care Physician: Deborra Medina Other Clinician: Referring Physician: Deborra Medina Treating Physician/Extender: Frann Rider in Treatment: 0 Vital Signs Time Taken: 08:30 Temperature (F): 97.7 Height (in): 61 Pulse (bpm): 60 Source: Stated Respiratory Rate (breaths/min): 20 Weight (lbs): 125 Blood Pressure (mmHg): 96/45 Source: Stated Reference Range: 80 - 120 mg / dl Body Mass Index (BMI): 23.6 Electronic Signature(s) Signed: 04/17/2016 3:56:11 PM By: Judene Companion MD Previous Signature: 04/16/2016 10:11:37 AM Version By: Catalina Lunger Entered By: Judene Companion on 04/17/2016 12:25:29

## 2016-04-23 ENCOUNTER — Encounter: Payer: PPO | Admitting: Surgery

## 2016-04-23 DIAGNOSIS — S31105A Unspecified open wound of abdominal wall, periumbilic region without penetration into peritoneal cavity, initial encounter: Secondary | ICD-10-CM | POA: Diagnosis not present

## 2016-04-24 NOTE — Progress Notes (Signed)
TAMAKI, WILKING ([id number]) Visit Report for 04/23/2016 Arrival Information Details Patient Name: Meagan Zavala, [name] Date of Service: 04/23/2016 10:00 AM Medical Record Number: [id number] Patient Account Number: [id number] Date of Birth/Sex: August 02, 1946 (70 y.o. Female) Treating RN: Montey Hora Primary Care Physician: Deborra Medina Other Clinician: Referring Physician: Deborra Medina Treating Physician/Extender: Frann Rider in Treatment: 1 Visit Information History Since Last Visit Added or deleted any medications: No Patient Arrived: Ambulatory Any new allergies or adverse reactions: No Arrival Time: 10:10 Had a fall or experienced change in No Accompanied By: self activities of daily living that may affect Transfer Assistance: None risk of falls: Patient Identification Verified: Yes Signs or symptoms of abuse/neglect since last No Secondary Verification Process Yes visito Completed: Hospitalized since last visit: No Patient Requires Transmission-Based No Pain Present Now: No Precautions: Patient Has Alerts: No Electronic Signature(s) Signed: 04/23/2016 4:22:06 PM By: Montey Hora Entered By: Montey Hora on 04/23/2016 10:12:10 [name] -------------------------------------------------------------------------------- Encounter Discharge Information Details Patient Name: Meagan Zavala I. Date of Service: 04/23/2016 10:00 AM Medical Record Number: [id number] Patient Account Number: [id number] Date of Birth/Sex: 17-Nov-1945 (70 y.o. Female) Treating RN: Montey Hora Primary Care Physician: Deborra Medina Other Clinician: Referring Physician: Deborra Medina Treating Physician/Extender: Frann Rider in Treatment: 1 Encounter Discharge Information Items Discharge Pain Level: 0 Discharge Condition: Stable Ambulatory Status: Ambulatory Discharge Destination: Home Transportation: Private Auto Accompanied By: Carlynn Purl Schedule Follow-up  Appointment: Yes Medication Reconciliation completed and provided to Patient/Care No Jameria Bradway: Provided on Clinical Summary of Care: 04/23/2016 Form Type Recipient Paper Patient [facility] Electronic Signature(s) Signed: 04/23/2016 4:22:06 PM By: Montey Hora Previous Signature: 04/23/2016 10:53:38 AM Version By: Ruthine Dose Entered By: Montey Hora on 04/23/2016 10:54:28 Meagan Zavala I. ([id number]) -------------------------------------------------------------------------------- Multi Wound Chart Details Patient Name: Meagan Zavala I. Date of Service: 04/23/2016 10:00 AM Medical Record Number: [id number] Patient Account Number: [id number] Date of Birth/Sex: [birth year] (70 y.o. Female) Treating RN: Montey Hora Primary Care Physician: Deborra Medina Other Clinician: Referring Physician: Deborra Medina Treating Physician/Extender: Frann Rider in Treatment: 1 Vital Signs Height(in): 61 Pulse(bpm): 58 Weight(lbs): 125 Blood Pressure 118/45 (mmHg): Body Mass Index(BMI): 24 Temperature(F): 97.9 Respiratory Rate 18 (breaths/min): Photos: [N/A:N/A] Wound Location: Right Abdomen - midline - N/A N/A Medial Wounding Event: Gradually Appeared N/A N/A Primary Etiology: Abscess N/A N/A Comorbid History: Confinement Anxiety N/A N/A Date Acquired: 02/27/2016 N/A N/A Weeks of Treatment: 1 N/A N/A Wound Status: Open N/A N/A Measurements L x W x D 0.7x2.3x0.2 N/A N/A (cm) Area (cm) : 1.264 N/A N/A Volume (cm) : 0.253 N/A N/A % Reduction in Area: -28.70% N/A N/A % Reduction in Volume: 14.20% N/A N/A Classification: Partial Thickness N/A N/A Exudate Amount: Medium N/A N/A Exudate Type: Serous N/A N/A Exudate Color: amber N/A N/A Wound Margin: Flat and Intact N/A N/A Granulation Amount: None Present (0%) N/A N/A Necrotic Amount: Large (67-100%) N/A N/A Exposed Structures: Fascia: No N/A N/A Fat: No [place], Rosealyn I. ([id number]) Tendon: No Muscle: No Joint: No Bone:  No Limited to Skin Breakdown Epithelialization: Small (1-33%) N/A N/A Periwound Skin Texture: Edema: No N/A N/A Excoriation: No Induration: No Callus: No Crepitus: No Fluctuance: No Friable: No Rash: No Scarring: No Periwound Skin Moist: Yes N/A N/A Moisture: Maceration: No Dry/Scaly: No Periwound Skin Color: Erythema: Yes N/A N/A Atrophie Blanche: No Cyanosis: No Ecchymosis: No Hemosiderin Staining: No Mottled: No Pallor: No Rubor: No Erythema Location: Circumferential N/A N/A Tenderness on No N/A N/A Palpation: Wound Preparation: Ulcer Cleansing: N/A N/A Rinsed/Irrigated  with Saline Topical Anesthetic Applied: Other: lidocaine 4% Treatment Notes Electronic Signature(s) Signed: 04/23/2016 4:22:06 PM By: Montey Hora Entered By: Montey Hora on 04/23/2016 10:19:48 [name] ([id number]) -------------------------------------------------------------------------------- [place] Details Patient Name: Meagan Zavala I. Date of Service: 04/23/2016 10:00 AM Medical Record Number: [id number] Patient Account Number: [id number] Date of Birth/Sex: [birth year] (70 y.o. Female) Treating RN: Montey Hora Primary Care Physician: Deborra Medina Other Clinician: Referring Physician: Deborra Medina Treating Physician/Extender: Frann Rider in Treatment: 1 Active Inactive Malignancy/Atypical Etiology Nursing Diagnoses: Knowledge deficit related to disease process and management of atypical ulcer etiology Goals: Patient/caregiver will verbalize understanding of disease process and disease management of atypical ulcer etiology Date Initiated: 04/16/2016 Goal Status: Active Interventions: Assess patient and family medical history for signs and symptoms of malignancy/atypical etiology upon admission Provide education on atypical ulcer etiologies Provide education on malignant ulcerations Treatment Activities: Biopsy for Pathology :  04/16/2016 Test ordered outside of clinic : 04/16/2016 Notes: Nutrition Nursing Diagnoses: Potential for alteratiion in Nutrition/Potential for imbalanced nutrition Goals: Patient/caregiver agrees to and verbalizes understanding of need to use nutritional supplements and/or vitamins as prescribed Date Initiated: 04/16/2016 Goal Status: Active Interventions: Assess patient nutrition upon admission and as needed per policy Provide education on nutrition LENITA, NISKANEN I. ([id number]) Notes: Orientation to the Wound Care Program Nursing Diagnoses: Knowledge deficit related to the wound healing center program Goals: Patient/caregiver will verbalize understanding of the [place] [facility] Date Initiated: 04/16/2016 Goal Status: Active Interventions: Provide education on orientation to the wound center Notes: Wound/Skin Impairment Nursing Diagnoses: Impaired tissue integrity Goals: Patient/caregiver will verbalize understanding of skin care regimen Date Initiated: 04/16/2016 Goal Status: Active Ulcer/skin breakdown will heal within 14 weeks Date Initiated: 04/16/2016 Goal Status: Active Interventions: Assess patient/caregiver ability to obtain necessary supplies Assess patient/caregiver ability to perform ulcer/skin care regimen upon admission and as needed Assess ulceration(s) every visit Treatment Activities: Skin care regimen initiated : 04/16/2016 Topical wound management initiated : 04/16/2016 Notes: Electronic Signature(s) Signed: 04/23/2016 4:22:06 PM By: Montey Hora Entered By: Montey Hora on 04/23/2016 10:19:38 [name] ([id number]) -------------------------------------------------------------------------------- Pain Assessment Details Patient Name: Meagan Zavala I. Date of Service: 04/23/2016 10:00 AM Medical Record Number: [id number] Patient Account Number: [id number] Date of Birth/Sex: 02/28/1946 (70 y.o. Female) Treating RN: Montey Hora Primary Care Physician: Deborra Medina Other Clinician: Referring Physician: Deborra Medina Treating Physician/Extender: Frann Rider in Treatment: 1 Active Problems Location of Pain Severity and Description of Pain Patient Has Paino No Site Locations Pain Management and Medication Current Pain Management: Notes Topical or injectable lidocaine is offered to patient for acute pain when surgical debridement is performed. If needed, Patient is instructed to use over the counter pain medication for the following 24-48 hours after debridement. Wound care MDs do not prescribed pain medications. Patient has chronic pain or uncontrolled pain. Patient has been instructed to make an appointment with their Primary Care Physician for pain management. Electronic Signature(s) Signed: 04/23/2016 4:22:06 PM By: Montey Hora Entered By: Montey Hora on 04/23/2016 10:12:19 [name] ([id number]) -------------------------------------------------------------------------------- Patient/Caregiver Education Details Patient Name: Meagan Zavala I. Date of Service: 04/23/2016 10:00 AM Medical Record Number: [id number] Patient Account Number: [id number] Date of Birth/Gender: 01-Jan-1946 (70 y.o. Female) Treating RN: Montey Hora Primary Care Physician: Deborra Medina Other Clinician: Referring Physician: Deborra Medina Treating Physician/Extender: Frann Rider in Treatment: 1 Education Assessment Education Provided To: Patient Education Topics Provided Wound/Skin Impairment: Handouts: Other: wound care as ordered Methods: Demonstration, Explain/Verbal Responses: State content correctly  Electronic Signature(s) Signed: 04/23/2016 4:22:06 PM By: Montey Hora Entered By: Montey Hora on 04/23/2016 10:29:52 [name] ([id number]) -------------------------------------------------------------------------------- Wound Assessment Details Patient Name: Meagan Zavala  I. Date of Service: 04/23/2016 10:00 AM Medical Record Number: [id number] Patient Account Number: [id number] Date of Birth/Sex: [birth year] (70 y.o. Female) Treating RN: Montey Hora Primary Care Physician: Deborra Medina Other Clinician: Referring Physician: Deborra Medina Treating Physician/Extender: Frann Rider in Treatment: 1 Wound Status Wound Number: 1 Primary Etiology: Abscess Wound Location: Right Abdomen - midline - Wound Status: Open Medial Comorbid History: Confinement Anxiety Wounding Event: Gradually Appeared Date Acquired: 02/27/2016 Weeks Of Treatment: 1 Clustered Wound: No Photos Wound Measurements Length: (cm) 0.7 Width: (cm) 2.3 Depth: (cm) 0.2 Area: (cm) 1.264 Volume: (cm) 0.253 % Reduction in Area: -28.7% % Reduction in Volume: 14.2% Epithelialization: Small (1-33%) Tunneling: No Undermining: No Wound Description Classification: Partial Thickness Wound Margin: Flat and Intact Exudate Amount: Medium Exudate Type: Serous Exudate Color: amber Foul Odor After Cleansing: No Wound Bed Granulation Amount: None Present (0%) Exposed Structure Necrotic Amount: Large (67-100%) Fascia Exposed: No Necrotic Quality: Adherent Slough Fat Layer Exposed: No Tendon Exposed: No Meagan Zavala, [name] ([id number]) Muscle Exposed: No Joint Exposed: No Bone Exposed: No Limited to Skin Breakdown Periwound Skin Texture Texture Color No Abnormalities Noted: No No Abnormalities Noted: No Callus: No Atrophie Blanche: No Crepitus: No Cyanosis: No Excoriation: No Ecchymosis: No Fluctuance: No Erythema: Yes Friable: No Erythema Location: Circumferential Induration: No Hemosiderin Staining: No Localized Edema: No Mottled: No Rash: No Pallor: No Scarring: No Rubor: No Moisture No Abnormalities Noted: No Dry / Scaly: No Maceration: No Moist: Yes Wound Preparation Ulcer Cleansing: Rinsed/Irrigated with Saline Topical Anesthetic Applied: Other:  lidocaine 4%, Treatment Notes Wound #1 (Right, Medial Abdomen - midline) 1. Cleansed with: Clean wound with Normal Saline 2. Anesthetic Topical Lidocaine 4% cream to wound bed prior to debridement 3. Peri-wound Care: Skin Prep 4. Dressing Applied: Santyl Ointment 5. Secondary Dressing Applied Bordered Foam Dressing Electronic Signature(s) Signed: 04/23/2016 4:22:06 PM By: Montey Hora Entered By: Montey Hora on 04/23/2016 10:19:17 [name] ([id number]) -------------------------------------------------------------------------------- Vitals Details Patient Name: Meagan Zavala I. Date of Service: 04/23/2016 10:00 AM Medical Record Number: [id number] Patient Account Number: [id number] Date of Birth/Sex: 06/28/1946 (70 y.o. Female) Treating RN: Montey Hora Primary Care Physician: Deborra Medina Other Clinician: Referring Physician: Deborra Medina Treating Physician/Extender: Frann Rider in Treatment: 1 Vital Signs Time Taken: 10:12 Temperature (F): 97.9 Height (in): 61 Pulse (bpm): 58 Weight (lbs): 125 Respiratory Rate (breaths/min): 18 Body Mass Index (BMI): 23.6 Blood Pressure (mmHg): 118/45 Reference Range: 80 - 120 mg / dl Electronic Signature(s) Signed: 04/23/2016 4:22:06 PM By: Montey Hora Entered By: Montey Hora on 04/23/2016 10:12:53

## 2016-04-24 NOTE — Progress Notes (Signed)
Meagan Zavala, Meagan Zavala ([id number]) Visit Report for 04/23/2016 Chief Complaint Document Details Patient Name: Meagan Zavala, Meagan Zavala 04/23/2016 10:00 Date of Service: AM Medical Record [id number] Number: Patient Account Number: [id number] April 16, 1946 (70 y.o. Treating RN: Montey Hora Date of Birth/Sex: Female) Other Clinician: Primary Care Physician: Deborra Medina Treating Christin Fudge Referring Physician: Deborra Medina Physician/Extender: Suella Grove in Treatment: 1 Information Obtained from: Patient Chief Complaint Patient seen for complaints of Non-Healing Wound to the right of from licorice on her abdominal wall for about a month Electronic Signature(s) Signed: 04/23/2016 10:51:10 AM By: Christin Fudge MD, FACS Entered By: Christin Fudge on 04/23/2016 10:51:10 [name] ([id number]) -------------------------------------------------------------------------------- Debridement Details Patient Name: Meagan Zavala I. 04/23/2016 10:00 Date of Service: AM Medical Record [id number] Number: Patient Account Number: [id number] 1946-02-25 (70 y.o. Treating RN: Montey Hora Date of Birth/Sex: Female) Other Clinician: Primary Care Physician: Deborra Medina Treating Christin Fudge Referring Physician: Deborra Medina Physician/Extender: Suella Grove in Treatment: 1 Debridement Performed for Wound #1 Right,Medial Abdomen - midline Assessment: Performed By: Physician Christin Fudge, MD Debridement: Debridement Pre-procedure Yes Verification/Time Out Taken: Start Time: 10:45 Pain Control: Lidocaine 4% Topical Solution Level: Skin/Subcutaneous Tissue Total Area Debrided (L x 0.7 (cm) x 2.3 (cm) = 1.61 (cm) W): Tissue and other Viable, Non-Viable, Eschar, Fibrin/Slough, Subcutaneous material debrided: Instrument: Curette Bleeding: Minimum Hemostasis Achieved: Pressure End Time: 10:47 Procedural Pain: 0 Post Procedural Pain: 0 Response to Treatment: Procedure was tolerated well Post Debridement  Measurements of Total Wound Length: (cm) 0.7 Width: (cm) 2.3 Depth: (cm) 0.2 Volume: (cm) 0.253 Post Procedure Diagnosis Same as Pre-procedure Electronic Signature(s) Signed: 04/23/2016 10:51:04 AM By: Christin Fudge MD, FACS Signed: 04/23/2016 4:22:06 PM By: Montey Hora Entered By: Christin Fudge on 04/23/2016 10:51:03 [name] ([id number]) -------------------------------------------------------------------------------- HPI Details Patient Name: Meagan Zavala I. 04/23/2016 10:00 Date of Service: AM Medical Record [id number] Number: Patient Account Number: [id number] 09-15-1946 (70 y.o. Treating RN: Montey Hora Date of Birth/Sex: Female) Other Clinician: Primary Care Physician: Deborra Medina Treating Christin Fudge Referring Physician: Deborra Medina Physician/Extender: Weeks in Treatment: 1 History of Present Illness Location: umbilical region of the abdominal wall Quality: an open ulcerated area some amount of pain and burning Severity: Patient states wound are getting worse. Duration: Patient has had the wound for < 4 weeks prior to presenting for treatment Timing: Pain in wound is Intermittent (comes and goes Context: The wound appeared gradually over time Modifying Factors: Other treatment(s) tried include:and doxycycline the present time Associated Signs and Symptoms: Patient reports having increase discharge. HPI Description: 70 year old patient who was recently seen by Dr. Tommi Rumps was noted to have a small ulceration on abdomen wall. the patient has had it for about 6 weeks in the periumbilical areaThe patient noted that it has become slightly larger and draining purulent material.was started on doxycycline and given an appointment to the wound center. She does not recall if she had any insect bite or injury her past medical history significant for GERD, hypothyroidism, osteoporosis, chronic kidney disease stage III, chronic low back pain, anemia,  vitamin D deficiency and urge incontinence. 7/27//2017 -- pathology report of specimen done -- DIAGNOSIS: A. SKIN, ABDOMEN; BIOPSY: - CHRONIC ULCER. - NEGATIVE FOR MALIGNANCY Electronic Signature(s) Signed: 04/23/2016 10:51:52 AM By: Christin Fudge MD, FACS Entered By: Christin Fudge on 04/23/2016 10:51:52 [name] ([id number]) -------------------------------------------------------------------------------- Physical Exam Details Patient Name: Meagan Zavala I. 04/23/2016 10:00 Date of Service: AM Medical Record [id number] Number: Patient Account Number: [id number] 01-30-1946 (70 y.o. Treating RN: Montey Hora Date of  Birth/Sex: Female) Other Clinician: Primary Care Physician: Deborra Medina Treating Christin Fudge Referring Physician: Deborra Medina Physician/Extender: Weeks in Treatment: 1 Constitutional . Pulse regular. Respirations normal and unlabored. Afebrile. . Eyes Nonicteric. Reactive to light. Ears, Nose, Mouth, and Throat Lips, teeth, and gums WNL.Marland Kitchen Moist mucosa without lesions. Neck supple and nontender. No palpable supraclavicular or cervical adenopathy. Normal sized without goiter. Respiratory WNL. No retractions.. Cardiovascular Pedal Pulses WNL. No clubbing, cyanosis or edema. Gastrointestinal (GI) Abdomen without masses or tenderness.. No liver or spleen enlargement or tenderness.. Lymphatic No adneopathy. No adenopathy. No adenopathy. Musculoskeletal Adexa without tenderness or enlargement.. Digits and nails w/o clubbing, cyanosis, infection, petechiae, ischemia, or inflammatory conditions.. Integumentary (Hair, Skin) No suspicious lesions. No crepitus or fluctuance. No peri-wound warmth or erythema. No masses.Marland Kitchen Psychiatric Judgement and insight Intact.. No evidence of depression, anxiety, or agitation.. Notes the wound has subcutaneous debris which are sharply removed with a #3 curet and bleeding controlled with pressure Electronic  Signature(s) Signed: 04/23/2016 10:52:29 AM By: Christin Fudge MD, FACS Entered By: Christin Fudge on 04/23/2016 10:52:28 [name] ([id number]) -------------------------------------------------------------------------------- Physician Orders Details Patient Name: Meagan Zavala I. 04/23/2016 10:00 Date of Service: AM Medical Record [id number] Number: Patient Account Number: [id number] [birth year] (70 y.o. Treating RN: Montey Hora Date of Birth/Sex: Female) Other Clinician: Primary Care Physician: Deborra Medina Treating Christin Fudge Referring Physician: Deborra Medina Physician/Extender: Suella Grove in Treatment: 1 Verbal / Phone Orders: Yes Clinician: Montey Hora Read Back and Verified: Yes Diagnosis Coding Wound Cleansing Wound #1 Right,Medial Abdomen - midline o Clean wound with Normal Saline. Anesthetic Wound #1 Right,Medial Abdomen - midline o Topical Lidocaine 4% cream applied to wound bed prior to debridement o Injected 2% Lidocaine without epinephrine prior to debridement Primary Wound Dressing Wound #1 Right,Medial Abdomen - midline o Santyl Ointment Secondary Dressing Wound #1 Right,Medial Abdomen - midline o Boardered Foam Dressing Dressing Change Frequency Wound #1 Right,Medial Abdomen - midline o Change dressing every day. Follow-up Appointments Wound #1 Right,Medial Abdomen - midline o Return Appointment in 1 week. Additional Orders / Instructions Wound #1 Right,Medial Abdomen - midline o Increase protein intake. Medications-please add to medication list. Wound #1 Right,Medial Abdomen - midline o Santyl Enzymatic Ointment Meagan Zavala, Meagan Zavala ([id number]) Electronic Signature(s) Signed: 04/23/2016 4:12:41 PM By: Christin Fudge MD, FACS Signed: 04/23/2016 4:22:06 PM By: Montey Hora Entered By: Montey Hora on 04/23/2016 10:47:10 [name]  ([id number]) -------------------------------------------------------------------------------- Problem List Details Patient Name: Meagan Zavala, [name] 04/23/2016 10:00 Date of Service: AM Medical Record [id number] Number: Patient Account Number: [id number] September 15, 1946 (70 y.o. 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 04/23/2016 10:00 Date of Service: AM Medical Record [id number] Number: Patient Account Number: [id number] [birth year] (70 y.o. Treating RN: Montey Hora Date of Birth/Sex: Female) Other Clinician: Primary Care Physician: Deborra Medina Treating Christin Fudge Referring Physician: Deborra Medina Physician/Extender: Suella Grove in Treatment: 1 Subjective Chief Complaint Information obtained from Patient Patient  seen for complaints of Non-Healing Wound to the right of from licorice on her abdominal wall for about a month History of Present Illness (HPI) The following HPI elements were documented for the patient's wound: Location: umbilical region of the abdominal wall Quality: an open ulcerated area some amount  of pain and burning Severity: Patient states wound are getting worse. Duration: Patient has had the wound for < 4 weeks prior to presenting for treatment Timing: Pain in wound is Intermittent (comes and goes Context: The wound appeared gradually over time Modifying Factors: Other treatment(s) tried include:and doxycycline the present time Associated Signs and Symptoms: Patient reports having increase discharge. 70 year old patient who was recently seen by Dr. Tommi Rumps was noted to have a small ulceration on abdomen wall. the patient has had it for about 6 weeks in the periumbilical areaThe patient noted that it has become slightly larger and draining purulent material.was started on doxycycline and given an appointment to the wound center. She does not recall if she had any insect bite or injury her past medical history significant for GERD, hypothyroidism, osteoporosis, chronic kidney disease stage III, chronic low back pain, anemia, vitamin D deficiency and urge incontinence. 7/27//2017 -- pathology report of specimen done -- DIAGNOSIS: A. SKIN, ABDOMEN; BIOPSY: - CHRONIC ULCER. - NEGATIVE FOR MALIGNANCY Objective Constitutional Pulse regular. Respirations normal and unlabored. Afebrile. Meagan Zavala, [name] ([id number]) Vitals Time Taken: 10:12 AM, Height: 61 in, Weight: 125 lbs, BMI: 23.6, Temperature: 97.9 F, Pulse: 58 bpm, Respiratory Rate: 18 breaths/min, Blood Pressure: 118/45 mmHg. Eyes Nonicteric. Reactive to light. Ears, Nose, Mouth, and Throat Lips, teeth, and gums WNL.Marland Kitchen Moist mucosa without lesions. Neck supple and nontender. No palpable supraclavicular or cervical adenopathy. Normal sized without goiter. Respiratory WNL. No retractions.. Cardiovascular Pedal Pulses WNL. No clubbing, cyanosis or edema. Gastrointestinal (GI) Abdomen without masses or tenderness.. No liver or spleen enlargement or tenderness.. Lymphatic No adneopathy. No adenopathy. No  adenopathy. Musculoskeletal Adexa without tenderness or enlargement.. Digits and nails w/o clubbing, cyanosis, infection, petechiae, ischemia, or inflammatory conditions.Marland Kitchen Psychiatric Judgement and insight Intact.. No evidence of depression, anxiety, or agitation.. General Notes: the wound has subcutaneous debris which are sharply removed with a #3 curet and bleeding controlled with pressure Integumentary (Hair, Skin) No suspicious lesions. No crepitus or fluctuance. No peri-wound warmth or erythema. No masses.. Wound #1 status is Open. Original cause of wound was Gradually Appeared. The wound is located on the Right,Medial Abdomen - midline. The wound measures 0.7cm length x 2.3cm width x 0.2cm depth; 1.264cm^2 area and 0.253cm^3 volume. The wound is limited to skin breakdown. There is no tunneling or undermining noted. There is a medium amount of serous drainage noted. The wound margin is flat and intact. There is no granulation within the wound bed. There is a large (67-100%) amount of necrotic tissue within the wound bed including Adherent Slough. The periwound skin appearance exhibited: Moist, Erythema. The periwound skin appearance did not exhibit: Callus, Crepitus, Excoriation, Fluctuance, Friable, Induration, Localized Edema, Rash, Scarring, Dry/Scaly, Maceration, Atrophie Blanche, Cyanosis, Ecchymosis, Hemosiderin Staining, Mottled, Pallor, Rubor. The surrounding wound skin color is noted with erythema which is circumferential. Meagan Zavala, [name] ([id number]) Assessment Active Problems ICD-10 S31.105A - Unspecified open wound of abdominal wall, periumbilic region without penetration into peritoneal cavity, initial encounter L02.221 - Furuncle of abdominal wall I have reviewed the benign nature of her pathology report and she is pleased. We will treat her with Santyl ointment locally on a daily basis and will see her back  next week. Procedures Wound #1 Wound #1 is an Abscess  located on the Right,Medial Abdomen - midline . There was a Skin/Subcutaneous Tissue Debridement [id number]) debridement with total area of 1.61 sq cm performed by Christin Fudge, MD. with the following instrument(s): Curette to remove Viable and Non-Viable tissue/material including Fibrin/Slough, Eschar, and Subcutaneous after achieving pain control using Lidocaine 4% Topical Solution. A time out was conducted prior to the start of the procedure. A Minimum amount of bleeding was controlled with Pressure. The procedure was tolerated well with a pain level of 0 throughout and a pain level of 0 following the procedure. Post Debridement Measurements: 0.7cm length x 2.3cm width x 0.2cm depth; 0.253cm^3 volume. Post procedure Diagnosis Wound #1: Same as Pre-Procedure Plan Wound Cleansing: Wound #1 Right,Medial Abdomen - midline: Clean wound with Normal Saline. Anesthetic: Meagan Zavala, Meagan Zavala ([id number]) Wound #1 Right,Medial Abdomen - midline: Topical Lidocaine 4% cream applied to wound bed prior to debridement Injected 2% Lidocaine without epinephrine prior to debridement Primary Wound Dressing: Wound #1 Right,Medial Abdomen - midline: Santyl Ointment Secondary Dressing: Wound #1 Right,Medial Abdomen - midline: Boardered Foam Dressing Dressing Change Frequency: Wound #1 Right,Medial Abdomen - midline: Change dressing every day. Follow-up Appointments: Wound #1 Right,Medial Abdomen - midline: Return Appointment in 1 week. Additional Orders / Instructions: Wound #1 Right,Medial Abdomen - midline: Increase protein intake. Medications-please add to medication list.: Wound #1 Right,Medial Abdomen - midline: Santyl Enzymatic Ointment I have reviewed the benign nature of her pathology report and she is pleased. We will treat her with Santyl ointment locally on a daily basis and will see her back next week. Electronic Signature(s) Signed: 04/23/2016 10:53:49 AM By: Christin Fudge MD,  FACS Entered By: Christin Fudge on 04/23/2016 10:53:49 [name] ([id number]) -------------------------------------------------------------------------------- SuperBill Details Patient Name: Meagan Zavala I. Date of Service: 04/23/2016 Medical Record Number: [id number] Patient Account Number: [id number] Date of Birth/Sex: 01/14/1946 (70 y.o. Female) Treating RN: Montey Hora Primary Care Physician: Deborra Medina Other Clinician: Referring Physician: Deborra Medina Treating Physician/Extender: Frann Rider in Treatment: 1 Diagnosis Coding ICD-10 Codes Code Description Unspecified open wound of abdominal wall, periumbilic region without penetration into S31.105A peritoneal cavity, initial encounter 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 Facility Procedures CPT4: Description Modifier Quantity Code [id number] - DEB SUBQ TISSUE 20 SQ CM/< 1 ICD-10 Description Diagnosis S31.105A Unspecified open wound of abdominal wall, periumbilic region without penetration into peritoneal cavity, initial encounter  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 Physician Procedures CPT4: Description Modifier Quantity Code [id number] - WC PHYS LEVEL 3 - EST PT 25 1 ICD-10 Description Diagnosis S31.105A Unspecified open wound of abdominal wall, periumbilic region without penetration into peritoneal cavity, initial encounter  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 ([id number]) Signed: 04/23/2016 10:54:08 AM By: Christin Fudge MD, FACS Entered By: Christin Fudge on 04/23/2016 10:54:08

## 2016-04-29 ENCOUNTER — Other Ambulatory Visit: Payer: Self-pay

## 2016-04-29 DIAGNOSIS — Z1211 Encounter for screening for malignant neoplasm of colon: Secondary | ICD-10-CM

## 2016-04-30 ENCOUNTER — Other Ambulatory Visit (INDEPENDENT_AMBULATORY_CARE_PROVIDER_SITE_OTHER): Payer: PPO

## 2016-04-30 ENCOUNTER — Encounter: Payer: PPO | Attending: Surgery | Admitting: Surgery

## 2016-04-30 DIAGNOSIS — X58XXXA Exposure to other specified factors, initial encounter: Secondary | ICD-10-CM | POA: Insufficient documentation

## 2016-04-30 DIAGNOSIS — Z7982 Long term (current) use of aspirin: Secondary | ICD-10-CM | POA: Diagnosis not present

## 2016-04-30 DIAGNOSIS — S31105A Unspecified open wound of abdominal wall, periumbilic region without penetration into peritoneal cavity, initial encounter: Secondary | ICD-10-CM | POA: Insufficient documentation

## 2016-04-30 DIAGNOSIS — N3941 Urge incontinence: Secondary | ICD-10-CM | POA: Insufficient documentation

## 2016-04-30 DIAGNOSIS — Z87891 Personal history of nicotine dependence: Secondary | ICD-10-CM | POA: Diagnosis not present

## 2016-04-30 DIAGNOSIS — N183 Chronic kidney disease, stage 3 (moderate): Secondary | ICD-10-CM | POA: Diagnosis not present

## 2016-04-30 DIAGNOSIS — E559 Vitamin D deficiency, unspecified: Secondary | ICD-10-CM | POA: Insufficient documentation

## 2016-04-30 DIAGNOSIS — L02221 Furuncle of abdominal wall: Secondary | ICD-10-CM | POA: Diagnosis not present

## 2016-04-30 DIAGNOSIS — K219 Gastro-esophageal reflux disease without esophagitis: Secondary | ICD-10-CM | POA: Insufficient documentation

## 2016-04-30 DIAGNOSIS — E039 Hypothyroidism, unspecified: Secondary | ICD-10-CM | POA: Insufficient documentation

## 2016-04-30 DIAGNOSIS — D631 Anemia in chronic kidney disease: Secondary | ICD-10-CM | POA: Insufficient documentation

## 2016-04-30 DIAGNOSIS — Z9884 Bariatric surgery status: Secondary | ICD-10-CM | POA: Diagnosis not present

## 2016-04-30 DIAGNOSIS — Z79899 Other long term (current) drug therapy: Secondary | ICD-10-CM | POA: Diagnosis not present

## 2016-04-30 DIAGNOSIS — Z1211 Encounter for screening for malignant neoplasm of colon: Secondary | ICD-10-CM | POA: Diagnosis not present

## 2016-04-30 DIAGNOSIS — M810 Age-related osteoporosis without current pathological fracture: Secondary | ICD-10-CM | POA: Insufficient documentation

## 2016-04-30 DIAGNOSIS — F419 Anxiety disorder, unspecified: Secondary | ICD-10-CM | POA: Diagnosis not present

## 2016-04-30 LAB — FECAL OCCULT BLOOD, IMMUNOCHEMICAL: FECAL OCCULT BLD: POSITIVE — AB

## 2016-05-01 NOTE — Progress Notes (Signed)
Meagan Zavala, Meagan Zavala ([id number]) Visit Report for 04/30/2016 Chief Complaint Document Details Patient Name: Meagan Zavala, Meagan Zavala I. Date of Service: 04/30/2016 1:30 PM Medical Record Number: [id number] Patient Account Number: [id number] Date of Birth/Sex: 01-03-1946 (70 y.o. 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 Date of Service: 04/30/2016 1:30 PM Medical Record Number: [id number] Patient Account Number: [id number] Date of Birth/Sex: December 20, 1945 (70 y.o. Female) Treating RN: Montey Hora Primary Care Physician: Deborra Medina Other Clinician: Referring Physician: Deborra Medina Treating Physician/Extender: Frann Rider in Treatment: 2 Debridement Performed for Wound #1 Right,Medial Abdomen - midline Assessment: Performed By: Physician Christin Fudge, MD Debridement: Debridement Pre-procedure Yes Verification/Time Out Taken: Start Time: 14:03 Pain Control: Lidocaine 4% Topical Solution Level: Skin/Subcutaneous Tissue Total Area Debrided (L x 0.5 (cm) x 2.2 (cm) = 1.1 (cm) W): Tissue and other Fibrin/Slough, Subcutaneous material debrided: Instrument: Curette Bleeding: Minimum Hemostasis Achieved: Pressure End Time: 14:08 Procedural Pain: 0 Post Procedural Pain: 0 Response to Treatment: Procedure was tolerated well Post Debridement Measurements of Total Wound Length: (cm)  0.5 Width: (cm) 2.2 Depth: (cm) 0.1 Volume: (cm) 0.086 Post Procedure Diagnosis Same as Pre-procedure Electronic Signature(s) Signed: 04/30/2016 2:28:48 PM By: Christin Fudge MD, FACS Signed: 04/30/2016 4:49:27 PM By: Montey Hora Entered By: Christin Fudge on 04/30/2016 14:28:47 Meagan Humphreys ([id number]) -------------------------------------------------------------------------------- HPI Details Patient Name: Meagan Austin I. Date of Service: 04/30/2016 1:30 PM Medical Record Number: [id number] Patient Account Number: [id number] Date of Birth/Sex: [birth year] (70 y.o. Female) Treating RN: Montey Hora Primary Care Physician: Deborra Medina Other Clinician: Referring Physician: Deborra Medina Treating Physician/Extender: Frann Rider in Treatment: 2 History of Present Illness Location: umbilical region of the abdominal wall Quality: an open ulcerated area some amount of pain and burning Severity: Patient states wound are getting worse. Duration: Patient has had the wound for < 4 weeks prior to presenting for treatment Timing: Pain in wound is Intermittent (comes and goes Context: The wound appeared gradually over time Modifying Factors: Other treatment(s) tried include:and doxycycline the present time Associated Signs and Symptoms: Patient reports having increase discharge. HPI Description: 69 year old patient who was recently seen by Dr. Tommi Rumps was noted to have a small ulceration on abdomen wall. the patient has had it for about 6 weeks in the periumbilical areaThe patient noted that it has become slightly larger and draining purulent material.was started on doxycycline and given an appointment to the wound center. She does not recall if she had any insect bite or injury her past medical history significant for GERD, hypothyroidism, osteoporosis, chronic kidney disease stage III, chronic low back pain, anemia, vitamin D deficiency and urge  incontinence. 7/27//2017 -- pathology report of specimen done -- DIAGNOSIS: A. SKIN, ABDOMEN; BIOPSY: - CHRONIC ULCER. - NEGATIVE FOR MALIGNANCY Electronic Signature(s) Signed: 04/30/2016 2:29:03 PM By: Christin Fudge MD, FACS Entered By: Christin Fudge on 04/30/2016 14:29:03 Meagan Humphreys ([id number]) -------------------------------------------------------------------------------- Physical Exam Details Patient Name: Meagan Austin I. Date of Service: 04/30/2016 1:30 PM Medical Record Number: [id number] Patient Account Number: [id number] Date of Birth/Sex: Mar 23, 1946 (69 y.o. Female) Treating RN: Montey Hora Primary  Care Physician: Deborra Medina Other Clinician: Referring Physician: Deborra Medina Treating Physician/Extender: Frann Rider in Treatment: 2 Constitutional . Pulse regular. Respirations normal and unlabored. Afebrile. . Eyes Nonicteric. Reactive to light. Ears, Nose, Mouth, and Throat Lips, teeth, and gums WNL.Marland Kitchen Moist mucosa without lesions. Neck supple and nontender. No palpable supraclavicular or cervical adenopathy. Normal sized without goiter. Respiratory WNL. No retractions.. Breath sounds WNL, No rubs, rales, rhonchi, or wheeze.. Cardiovascular Heart rhythm and rate regular, no murmur or gallop.. Pedal Pulses WNL. No clubbing, cyanosis or edema. Lymphatic No adneopathy. No adenopathy. No adenopathy. Musculoskeletal Adexa without tenderness or enlargement.. Digits and nails w/o clubbing, cyanosis, infection, petechiae, ischemia, or inflammatory conditions.. Integumentary (Hair, Skin) No suspicious lesions. No crepitus or fluctuance. No peri-wound warmth or erythema. No masses.Marland Kitchen Psychiatric Judgement and insight Intact.. No evidence of depression, anxiety, or agitation.. Notes there is still some subcutaneous debris which have sharply removed with a #3 curet and bleeding controlled with pressure Electronic Signature(s) Signed: 04/30/2016 2:29:41 PM By:  Christin Fudge MD, FACS Entered By: Christin Fudge on 04/30/2016 14:29:41 Meagan Humphreys ([id number]) -------------------------------------------------------------------------------- Physician Orders Details Patient Name: Meagan Austin I. Date of Service: 04/30/2016 1:30 PM Medical Record Patient Account Number: [id number] Number: Afful, RN, BSN, Treating RN: 1945-10-18 [phone] y.o. Velva Harman Date of Birth/Sex: Female) Other Clinician: Primary Care Physician: Deborra Medina Treating Christin Fudge Referring Physician: Deborra Medina Physician/Extender: Suella Grove in Treatment: 2 Verbal / Phone Orders: Yes Clinician: Afful, RN, BSN, Rita Read Back and Verified: Yes Diagnosis Coding Wound Cleansing Wound #1 Right,Medial Abdomen - midline o Clean wound with Normal Saline. Anesthetic Wound #1 Right,Medial Abdomen - midline o Topical Lidocaine 4% cream applied to wound bed prior to debridement o Injected 2% Lidocaine without epinephrine prior to debridement Primary Wound Dressing Wound #1 Right,Medial Abdomen - midline o Santyl Ointment Secondary Dressing Wound #1 Right,Medial Abdomen - midline o Boardered Foam Dressing Dressing Change Frequency Wound #1 Right,Medial Abdomen - midline o Change dressing every day. Follow-up Appointments Wound #1 Right,Medial Abdomen - midline o Return Appointment in 1 week. Additional Orders / Instructions Wound #1 Right,Medial Abdomen - midline o Increase protein intake. Medications-please add to medication list. Wound #1 Right,Medial Abdomen - midline o Santyl Enzymatic Ointment Meagan Zavala, Meagan ([id number]) Electronic Signature(s) Signed: 04/30/2016 4:00:53 PM By: Regan Lemming BSN, RN Signed: 04/30/2016 4:34:24 PM By: Christin Fudge MD, FACS Entered By: Regan Lemming on 04/30/2016 14:09:19 Meagan Humphreys ([id number]) -------------------------------------------------------------------------------- Problem List Details Patient  Name: Meagan Austin I. Date of Service: 04/30/2016 1:30 PM Medical Record Number: [id number] Patient Account Number: [id number] Date of Birth/Sex: 07-28-1946 (71 y.o. 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 Date of Service: 04/30/2016 1:30 PM Medical Record Number: [id number] Patient Account Number: [id number] Date of Birth/Sex: 09-05-1946 (70 y.o. 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 Duration: Patient has had the wound for < 4 weeks prior to  presenting for treatment Timing: Pain in wound is Intermittent (comes and goes Context: The wound appeared gradually over time Modifying Factors: Other treatment(s) tried include:and doxycycline the present time Associated Signs and Symptoms: Patient reports having increase discharge. 70 year old patient who was recently seen by Dr. Tommi Rumps was noted to have a small ulceration on abdomen wall. the patient has had it for about 6 weeks in the periumbilical areaThe patient noted that it has become slightly larger and draining purulent material.was started on doxycycline and given an appointment to the wound center. She does not recall if she had any insect bite or injury her past medical history significant for GERD, hypothyroidism, osteoporosis, chronic kidney disease stage III, chronic low back pain, anemia, vitamin D deficiency and urge incontinence. 7/27//2017 -- pathology report of specimen done -- DIAGNOSIS: A. SKIN, ABDOMEN; BIOPSY: - CHRONIC ULCER. - NEGATIVE FOR MALIGNANCY Objective Constitutional Pulse regular. Respirations normal and unlabored. Afebrile. Vitals Time Taken: 1:47 PM, Height: 61 in, Weight: 125 lbs, BMI: 23.6, Temperature: 98.3 F, Pulse: 56 bpm, Respiratory Rate: 18 breaths/min, Blood Pressure: 93/41 mmHg. Meagan Zavala, [name] ([id number]) Eyes Nonicteric. Reactive to light. Ears, Nose, Mouth, and Throat Lips, teeth, and gums WNL.Marland Kitchen Moist mucosa without lesions. Neck supple and nontender. No palpable supraclavicular or cervical adenopathy. Normal sized without goiter. Respiratory WNL. No retractions.. Breath sounds WNL, No rubs, rales, rhonchi, or wheeze.. Cardiovascular Heart rhythm and rate regular, no murmur or gallop.. Pedal Pulses WNL. No clubbing, cyanosis or edema. Lymphatic No adneopathy. No adenopathy. No adenopathy. Musculoskeletal Adexa without tenderness or enlargement.. Digits and nails w/o clubbing, cyanosis, infection, petechiae, ischemia, or  inflammatory conditions.Marland Kitchen Psychiatric Judgement and insight Intact.. No evidence of depression, anxiety, or agitation.. General Notes: there is still some subcutaneous debris which have sharply removed with a #3 curet and bleeding controlled with pressure Integumentary (Hair, Skin) No suspicious lesions. No crepitus or fluctuance. No peri-wound warmth or erythema. No masses.. Wound #1 status is Open. Original cause of wound was Gradually Appeared. The wound is located on the Right,Medial Abdomen - midline. The wound measures 0.5cm length x 2.2cm width x 0.1cm depth; 0.864cm^2 area and 0.086cm^3 volume. The wound is limited to skin breakdown. There is no tunneling or undermining noted. There is a medium amount of serous drainage noted. The wound margin is flat and intact. There is medium (34-66%) red granulation within the wound bed. There is a medium (34-66%) amount of necrotic tissue within the wound bed including Adherent Slough. The periwound skin appearance exhibited: Moist, Erythema. The periwound skin appearance did not exhibit: Callus, Crepitus, Excoriation, Fluctuance, Friable, Induration, Localized Edema, Rash, Scarring, Dry/Scaly, Maceration, Atrophie Blanche, Cyanosis, Ecchymosis, Hemosiderin Staining, Mottled, Pallor, Rubor. The surrounding wound skin color is noted with erythema which is circumferential. Assessment Meagan Zavala, [name] ([id number]) Active Problems ICD-10 S31.105A - Unspecified open wound of abdominal wall, periumbilic region without penetration into peritoneal cavity, initial encounter L02.221 - Furuncle of abdominal wall Procedures Wound #1 Wound #1 is an Abscess located on the Right,Medial Abdomen - midline .  There was a Skin/Subcutaneous Tissue Debridement [id number]) debridement with total area of 1.1 sq cm performed by Christin Fudge, MD. with the following instrument(s): Curette including Fibrin/Slough and Subcutaneous after achieving pain control using  Lidocaine 4% Topical Solution. A time out was conducted prior to the start of the procedure. A Minimum amount of bleeding was controlled with Pressure. The procedure was tolerated well with a pain level of 0 throughout and a pain level of 0 following the procedure. Post Debridement Measurements: 0.5cm length x 2.2cm width x 0.1cm depth; 0.086cm^3 volume. Post procedure Diagnosis Wound #1: Same as Pre-Procedure Plan Wound Cleansing: Wound #1 Right,Medial Abdomen - midline: Clean wound with Normal Saline. Anesthetic: Wound #1 Right,Medial Abdomen - midline: Topical Lidocaine 4% cream applied to wound bed prior to debridement Injected 2% Lidocaine without epinephrine prior to debridement Primary Wound Dressing: Wound #1 Right,Medial Abdomen - midline: Santyl Ointment Secondary Dressing: Wound #1 Right,Medial Abdomen - midline: Boardered Foam Dressing Dressing Change Frequency: Wound #1 Right,Medial Abdomen - midline: Change dressing every day. Follow-up Appointments: Wound #1 Right,Medial Abdomen - midline: Return Appointment in 1 week. Meagan Zavala, [name] ([id number]) Additional Orders / Instructions: Wound #1 Right,Medial Abdomen - midline: Increase protein intake. Medications-please add to medication list.: Wound #1 Right,Medial Abdomen - midline: Santyl Enzymatic Ointment We will continue with Santyl ointment applications daily after washing this well with soap and water and an appropriate dressing has been applied. She will come back and see as next week. Electronic Signature(s) Signed: 04/30/2016 2:30:45 PM By: Christin Fudge MD, FACS Entered By: Christin Fudge on 04/30/2016 14:30:45 Meagan Humphreys ([id number]) -------------------------------------------------------------------------------- SuperBill Details Patient Name: Meagan Austin I. Date of Service: 04/30/2016 Medical Record Number: [id number] Patient Account Number: [id number] Date of Birth/Sex: 05/24/1946 (70 y.o.  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 Facility Procedures CPT4: Description Modifier Quantity Code [id number] - DEB SUBQ TISSUE 20 SQ CM/< 1 ICD-10 Description Diagnosis S31.105A Unspecified open wound of abdominal wall, periumbilic region without penetration into peritoneal cavity, initial encounter  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

## 2016-05-01 NOTE — Progress Notes (Signed)
Meagan Zavala, Meagan Zavala ([id number]) Visit Report for 04/30/2016 Arrival Information Details Patient Name: Meagan Zavala, [name] Date of Service: 04/30/2016 1:30 PM Medical Record Number: [id number] Patient Account Number: [id number] Date of Birth/Sex: Jun 02, 1946 (70 y.o. Female) Treating RN: Montey Hora Primary Care Physician: Deborra Medina Other Clinician: Referring Physician: Deborra Medina Treating Physician/Extender: Frann Rider in Treatment: 2 Visit Information History Since Last Visit Added or deleted any medications: No Patient Arrived: Ambulatory Any new allergies or adverse reactions: No Arrival Time: 13:39 Had a fall or experienced change in No Accompanied By: self activities of daily living that may affect Transfer Assistance: None risk of falls: Patient Identification Verified: Yes Signs or symptoms of abuse/neglect since last No Secondary Verification Process Yes visito Completed: Hospitalized since last visit: No Patient Requires Transmission-Based No Pain Present Now: No Precautions: Patient Has Alerts: No Electronic Signature(s) Signed: 04/30/2016 4:49:27 PM By: Montey Hora Entered By: Montey Hora on 04/30/2016 13:39:43 Meagan Zavala ([id number]) -------------------------------------------------------------------------------- Encounter Discharge Information Details Patient Name: Meagan Zavala I. Date of Service: 04/30/2016 1:30 PM Medical Record Number: [id number] Patient Account Number: [id number] Date of Birth/Sex: 1946-05-21 (70 y.o. Female) Treating RN: Montey Hora Primary Care Physician: Deborra Medina Other Clinician: Referring Physician: Deborra Medina Treating Physician/Extender: Frann Rider in Treatment: 2 Encounter Discharge Information Items Discharge Pain Level: 0 Discharge Condition: Stable Ambulatory Status: Ambulatory Discharge Destination: Home Transportation: Private Auto Accompanied By: self Schedule Follow-up  Appointment: Yes Medication Reconciliation completed and provided to Patient/Care No Reilly Molchan: Provided on Clinical Summary of Care: 04/30/2016 Form Type Recipient Paper Patient [facility] Electronic Signature(s) Signed: 04/30/2016 2:16:11 PM By: Ruthine Dose Entered By: Ruthine Dose on 04/30/2016 14:16:11 Meagan Zavala I. ([id number]) -------------------------------------------------------------------------------- Multi Wound Chart Details Patient Name: Meagan Zavala I. Date of Service: 04/30/2016 1:30 PM Medical Record Number: [id number] Patient Account Number: [id number] Date of Birth/Sex: April 19, 1946 (70 y.o. Female) Treating RN: Baruch Gouty, RN, BSN, Velva Harman Primary Care Physician: Deborra Medina Other Clinician: Referring Physician: Deborra Medina Treating Physician/Extender: Frann Rider in Treatment: 2 Vital Signs Height(in): 61 Pulse(bpm): 56 Weight(lbs): 125 Blood Pressure 93/41 (mmHg): Body Mass Index(BMI): 24 Temperature(F): 98.3 Respiratory Rate 18 (breaths/min): Photos: [N/A:N/A] Wound Location: Right Abdomen - midline - N/A N/A Medial Wounding Event: Gradually Appeared N/A N/A Primary Etiology: Abscess N/A N/A Comorbid History: Confinement Anxiety N/A N/A Date Acquired: 02/27/2016 N/A N/A Weeks of Treatment: 2 N/A N/A Wound Status: Open N/A N/A Measurements L x W x D 0.5x2.2x0.1 N/A N/A (cm) Area (cm) : 0.864 N/A N/A Volume (cm) : 0.086 N/A N/A % Reduction in Area: 12.00% N/A N/A % Reduction in Volume: 70.80% N/A N/A Classification: Partial Thickness N/A N/A Exudate Amount: Medium N/A N/A Exudate Type: Serous N/A N/A Exudate Color: amber N/A N/A Wound Margin: Flat and Intact N/A N/A Granulation Amount: Medium (34-66%) N/A N/A Granulation Quality: Red N/A N/A Necrotic Amount: Medium (34-66%) N/A N/A Kaspar, [name] ([id number]) Exposed Structures: Fascia: No N/A N/A Fat: No Tendon: No Muscle: No Joint: No Bone: No Limited to  Skin Breakdown Epithelialization: Small (1-33%) N/A N/A Periwound Skin Texture: Edema: No N/A N/A Excoriation: No Induration: No Callus: No Crepitus: No Fluctuance: No Friable: No Rash: No Scarring: No Periwound Skin Moist: Yes N/A N/A Moisture: Maceration: No Dry/Scaly: No Periwound Skin Color: Erythema: Yes N/A N/A Atrophie Blanche: No Cyanosis: No Ecchymosis: No Hemosiderin Staining: No Mottled: No Pallor: No Rubor: No Erythema Location: Circumferential N/A N/A Tenderness on No N/A N/A Palpation: Wound Preparation: Ulcer Cleansing: N/A N/A Rinsed/Irrigated with Saline Topical  Anesthetic Applied: Other: lidocaine 4% Treatment Notes Electronic Signature(s) Signed: 04/30/2016 4:00:53 PM By: Regan Lemming BSN, RN Entered By: Regan Lemming on 04/30/2016 14:07:07 Meagan Zavala ([id number]) -------------------------------------------------------------------------------- [place] Details Patient Name: Meagan Zavala I. Date of Service: 04/30/2016 1:30 PM Medical Record Number: [id number] Patient Account Number: [id number] Date of Birth/Sex: Nov 03, 1945 (70 y.o. Female) Treating RN: Afful, RN, BSN, Velva Harman Primary Care Physician: Deborra Medina Other Clinician: Referring Physician: Deborra Medina Treating Physician/Extender: Frann Rider in Treatment: 2 Active Inactive Malignancy/Atypical Etiology Nursing Diagnoses: Knowledge deficit related to disease process and management of atypical ulcer etiology Goals: Patient/caregiver will verbalize understanding of disease process and disease management of atypical ulcer etiology Date Initiated: 04/16/2016 Goal Status: Active Interventions: Assess patient and family medical history for signs and symptoms of malignancy/atypical etiology upon admission Provide education on atypical ulcer etiologies Provide education on malignant ulcerations Treatment Activities: Biopsy for Pathology : 04/16/2016 Test  ordered outside of clinic : 04/16/2016 Notes: Nutrition Nursing Diagnoses: Potential for alteratiion in Nutrition/Potential for imbalanced nutrition Goals: Patient/caregiver agrees to and verbalizes understanding of need to use nutritional supplements and/or vitamins as prescribed Date Initiated: 04/16/2016 Goal Status: Active Interventions: Assess patient nutrition upon admission and as needed per policy Provide education on nutrition Meagan Zavala, Meagan Zavala I. ([id number]) Notes: Orientation to the Wound Care Program Nursing Diagnoses: Knowledge deficit related to the wound healing center program Goals: Patient/caregiver will verbalize understanding of the [place] Program Date Initiated: 04/16/2016 Goal Status: Active Interventions: Provide education on orientation to the wound center Notes: Wound/Skin Impairment Nursing Diagnoses: Impaired tissue integrity Goals: Patient/caregiver will verbalize understanding of skin care regimen Date Initiated: 04/16/2016 Goal Status: Active Ulcer/skin breakdown will heal within 14 weeks Date Initiated: 04/16/2016 Goal Status: Active Interventions: Assess patient/caregiver ability to obtain necessary supplies Assess patient/caregiver ability to perform ulcer/skin care regimen upon admission and as needed Assess ulceration(s) every visit Treatment Activities: Skin care regimen initiated : 04/16/2016 Topical wound management initiated : 04/16/2016 Notes: Electronic Signature(s) Signed: 04/30/2016 4:00:53 PM By: Regan Lemming BSN, RN Entered By: Regan Lemming on 04/30/2016 14:06:55 Meagan Zavala IMarland Kitchen ([id number]) -------------------------------------------------------------------------------- Pain Assessment Details Patient Name: Meagan Zavala I. Date of Service: 04/30/2016 1:30 PM Medical Record Number: [id number] Patient Account Number: [id number] Date of Birth/Sex: May 10, 1946 (70 y.o. Female) Treating RN: Montey Hora Primary Care  Physician: Deborra Medina Other Clinician: Referring Physician: Deborra Medina Treating Physician/Extender: Frann Rider in Treatment: 2 Active Problems Location of Pain Severity and Description of Pain Patient Has Paino No Site Locations Pain Management and Medication Current Pain Management: Notes Topical or injectable lidocaine is offered to patient for acute pain when surgical debridement is performed. If needed, Patient is instructed to use over the counter pain medication for the following 24-48 hours after debridement. Wound care MDs do not prescribed pain medications. Patient has chronic pain or uncontrolled pain. Patient has been instructed to make an appointment with their Primary Care Physician for pain management. Electronic Signature(s) Signed: 04/30/2016 4:49:27 PM By: Montey Hora Entered By: Montey Hora on 04/30/2016 13:39:50 Meagan Zavala ([id number]) -------------------------------------------------------------------------------- Patient/Caregiver Education Details Patient Name: Meagan Zavala I. Date of Service: 04/30/2016 1:30 PM Medical Record Number: [id number] Patient Account Number: [id number] Date of Birth/Gender: 1945-11-23 (70 y.o. Female) Treating RN: Montey Hora Primary Care Physician: Deborra Medina Other Clinician: Referring Physician: Deborra Medina Treating Physician/Extender: Frann Rider in Treatment: 2 Education Assessment Education Provided To: Patient Education Topics Provided Wound/Skin Impairment: Handouts: Other: continue wound care as ordered Methods: Demonstration, Explain/Verbal  Responses: State content correctly Electronic Signature(s) Signed: 04/30/2016 4:49:27 PM By: Montey Hora Entered By: Montey Hora on 04/30/2016 14:15:43 Meagan Zavala IMarland Kitchen ([id number]) -------------------------------------------------------------------------------- Wound Assessment Details Patient Name: Meagan Zavala I. Date of  Service: 04/30/2016 1:30 PM Medical Record Number: [id number] Patient Account Number: [id number] Date of Birth/Sex: 08/31/1946 (70 y.o. Female) Treating RN: Montey Hora Primary Care Physician: Deborra Medina Other Clinician: Referring Physician: Deborra Medina Treating Physician/Extender: Frann Rider in Treatment: 2 Wound Status Wound Number: 1 Primary Etiology: Abscess Wound Location: Right Abdomen - midline - Wound Status: Open Medial Comorbid History: Confinement Anxiety Wounding Event: Gradually Appeared Date Acquired: 02/27/2016 Weeks Of Treatment: 2 Clustered Wound: No Photos Wound Measurements Length: (cm) 0.5 Width: (cm) 2.2 Depth: (cm) 0.1 Area: (cm) 0.864 Volume: (cm) 0.086 % Reduction in Area: 12% % Reduction in Volume: 70.8% Epithelialization: Small (1-33%) Tunneling: No Undermining: No Wound Description Classification: Partial Thickness Wound Margin: Flat and Intact Exudate Amount: Medium Exudate Type: Serous Exudate Color: amber Foul Odor After Cleansing: No Wound Bed Granulation Amount: Medium (34-66%) Exposed Structure Granulation Quality: Red Fascia Exposed: No Necrotic Amount: Medium (34-66%) Fat Layer Exposed: No Necrotic Quality: Adherent Slough Tendon Exposed: No Kamath, [name] ([id number]) Muscle Exposed: No Joint Exposed: No Bone Exposed: No Limited to Skin Breakdown Periwound Skin Texture Texture Color No Abnormalities Noted: No No Abnormalities Noted: No Callus: No Atrophie Blanche: No Crepitus: No Cyanosis: No Excoriation: No Ecchymosis: No Fluctuance: No Erythema: Yes Friable: No Erythema Location: Circumferential Induration: No Hemosiderin Staining: No Localized Edema: No Mottled: No Rash: No Pallor: No Scarring: No Rubor: No Moisture No Abnormalities Noted: No Dry / Scaly: No Maceration: No Moist: Yes Wound Preparation Ulcer Cleansing: Rinsed/Irrigated with Saline Topical Anesthetic Applied: Other:  lidocaine 4%, Treatment Notes Wound #1 (Right, Medial Abdomen - midline) 1. Cleansed with: Clean wound with Normal Saline 2. Anesthetic Topical Lidocaine 4% cream to wound bed prior to debridement 3. Peri-wound Care: Skin Prep 4. Dressing Applied: Santyl Ointment 5. Secondary Dressing Applied Bordered Foam Dressing Dry Gauze Electronic Signature(s) Signed: 04/30/2016 4:49:27 PM By: Montey Hora Entered By: Montey Hora on 04/30/2016 13:46:39 Meagan Zavala ([id number]) -------------------------------------------------------------------------------- [place] Details Patient Name: Meagan Zavala I. Date of Service: 04/30/2016 1:30 PM Medical Record Number: [id number] Patient Account Number: [id number] Date of Birth/Sex: Jul 05, 1946 (70 y.o. Female) Treating RN: Montey Hora Primary Care Physician: Deborra Medina Other Clinician: Referring Physician: Deborra Medina Treating Physician/Extender: Frann Rider in Treatment: 2 Vital Signs Time Taken: 13:47 Temperature (F): 98.3 Height (in): 61 Pulse (bpm): 56 Weight (lbs): 125 Respiratory Rate (breaths/min): 18 Body Mass Index (BMI): 23.6 Blood Pressure (mmHg): 93/41 Reference Range: 80 - 120 mg / dl Electronic Signature(s) Signed: 04/30/2016 4:49:27 PM By: Montey Hora Entered By: Montey Hora on 04/30/2016 13:49:06

## 2016-05-05 ENCOUNTER — Other Ambulatory Visit: Payer: Self-pay | Admitting: Internal Medicine

## 2016-05-05 DIAGNOSIS — R195 Other fecal abnormalities: Secondary | ICD-10-CM

## 2016-05-07 ENCOUNTER — Encounter: Payer: PPO | Admitting: Surgery

## 2016-05-07 DIAGNOSIS — S31105A Unspecified open wound of abdominal wall, periumbilic region without penetration into peritoneal cavity, initial encounter: Secondary | ICD-10-CM | POA: Diagnosis not present

## 2016-05-08 ENCOUNTER — Encounter: Payer: Self-pay | Admitting: Internal Medicine

## 2016-05-08 ENCOUNTER — Telehealth: Payer: Self-pay | Admitting: *Deleted

## 2016-05-08 NOTE — Telephone Encounter (Signed)
Please advise 

## 2016-05-08 NOTE — Progress Notes (Signed)
Meagan, Zavala ([id number]) Visit Report for 05/07/2016 Chief Complaint Document Details Patient Name: Meagan Zavala I. Date of Service: 05/07/2016 1:30 PM Medical Record Number: [id number] Patient Account Number: [id number] Date of Birth/Sex: 02-Apr-1946 (70 y.o. Female) Treating RN: Montey Hora Primary Care Physician: Deborra Medina Other Clinician: Referring Physician: Deborra Medina Treating Physician/Extender: Frann Rider in Treatment: 3 Information Obtained from: Patient Chief Complaint Patient seen for complaints of Non-Healing Wound to the right of from licorice on her abdominal wall for about a month Electronic Signature(s) Signed: 05/07/2016 2:20:22 PM By: Christin Fudge MD, FACS Entered By: Christin Fudge on 05/07/2016 14:20:22 Meagan Zavala ([id number]) -------------------------------------------------------------------------------- HPI Details Patient Name: Meagan Zavala I. Date of Service: 05/07/2016 1:30 PM Medical Record Number: [id number] Patient Account Number: [id number] Date of Birth/Sex: 1945/12/09 (70 y.o. Female) Treating RN: Montey Hora Primary Care Physician: Deborra Medina Other Clinician: Referring Physician: Deborra Medina Treating Physician/Extender: Frann Rider in Treatment: 3 History of Present Illness Location: umbilical region of the abdominal wall Quality: an open ulcerated area some amount of pain and burning Severity: Patient states wound are getting worse. Duration: Patient has had the wound for < 4 weeks prior to presenting for treatment Timing: Pain in wound is Intermittent (comes and goes Context: The wound appeared gradually over time Modifying Factors: Other treatment(s) tried include:and doxycycline the present time Associated Signs and Symptoms: Patient reports having increase discharge. HPI Description: 70 year old patient who was recently seen by Dr. Tommi Rumps was noted to have a small ulceration on abdomen  wall. the patient has had it for about 6 weeks in the periumbilical areaThe patient noted that it has become slightly larger and draining purulent material.was started on doxycycline and given an appointment to the wound center. She does not recall if she had any insect bite or injury her past medical history significant for GERD, hypothyroidism, osteoporosis, chronic kidney disease stage III, chronic low back pain, anemia, vitamin D deficiency and urge incontinence. 7/27//2017 -- pathology report of specimen done -- DIAGNOSIS: A. SKIN, ABDOMEN; BIOPSY: - CHRONIC ULCER. - NEGATIVE FOR MALIGNANCY Electronic Signature(s) Signed: 05/07/2016 2:20:27 PM By: Christin Fudge MD, FACS Entered By: Christin Fudge on 05/07/2016 14:20:27 Meagan Zavala ([id number]) -------------------------------------------------------------------------------- Physical Exam Details Patient Name: Meagan Zavala I. Date of Service: 05/07/2016 1:30 PM Medical Record Number: [id number] Patient Account Number: [id number] Date of Birth/Sex: 1946-04-28 (70 y.o. Female) Treating RN: Montey Hora Primary Care Physician: Deborra Medina Other Clinician: Referring Physician: Deborra Medina Treating Physician/Extender: Frann Rider in Treatment: 3 Constitutional . Pulse regular. Respirations normal and unlabored. Afebrile. . Eyes Nonicteric. Reactive to light. Ears, Nose, Mouth, and Throat Lips, teeth, and gums WNL.Marland Kitchen Moist mucosa without lesions. Neck supple and nontender. No palpable supraclavicular or cervical adenopathy. Normal sized without goiter. Respiratory WNL. No retractions.. Breath sounds WNL, No rubs, rales, rhonchi, or wheeze.. Cardiovascular Heart rhythm and rate regular, no murmur or gallop.. Pedal Pulses WNL. No clubbing, cyanosis or edema. Chest Breasts symmetical and no nipple discharge.. Breast tissue WNL, no masses, lumps, or tenderness.. Lymphatic No adneopathy. No adenopathy. No  adenopathy. Musculoskeletal Adexa without tenderness or enlargement.. Digits and nails w/o clubbing, cyanosis, infection, petechiae, ischemia, or inflammatory conditions.. Integumentary (Hair, Skin) No suspicious lesions. No crepitus or fluctuance. No peri-wound warmth or erythema. No masses.Marland Kitchen Psychiatric Judgement and insight Intact.. No evidence of depression, anxiety, or agitation.. Notes debris at the base of the wound was washed out with moist saline gauze and there is healthy granulation tissue, though a bit  fibrotic in places. Electronic Signature(s) Signed: 05/07/2016 2:21:20 PM By: Christin Fudge MD, FACS Entered By: Christin Fudge on 05/07/2016 14:21:18 Meagan Zavala ([id number]) -------------------------------------------------------------------------------- Physician Orders Details Patient Name: Meagan Zavala I. Date of Service: 05/07/2016 1:30 PM Medical Record Number: [id number] Patient Account Number: [id number] Date of Birth/Sex: December 16, 1945 (70 y.o. Female) Treating RN: Montey Hora Primary Care Physician: Deborra Medina Other Clinician: Referring Physician: Deborra Medina Treating Physician/Extender: Frann Rider in Treatment: 3 Verbal / Phone Orders: Yes Clinician: Montey Hora Read Back and Verified: Yes Diagnosis Coding Wound Cleansing Wound #1 Right,Medial Abdomen - midline o Clean wound with Normal Saline. Anesthetic Wound #1 Right,Medial Abdomen - midline o Topical Lidocaine 4% cream applied to wound bed prior to debridement o Injected 2% Lidocaine without epinephrine prior to debridement Primary Wound Dressing Wound #1 Right,Medial Abdomen - midline o Prisma Ag Secondary Dressing Wound #1 Right,Medial Abdomen - midline o Boardered Foam Dressing Dressing Change Frequency Wound #1 Right,Medial Abdomen - midline o Change dressing every other day. Follow-up Appointments Wound #1 Right,Medial Abdomen - midline o Return  Appointment in 1 week. Additional Orders / Instructions Wound #1 Right,Medial Abdomen - midline o Increase protein intake. Electronic Signature(s) Signed: 05/07/2016 4:38:29 PM By: Montey Hora Signed: 05/07/2016 4:56:45 PM By: Christin Fudge MD, FACS Entered By: Montey Hora on 05/07/2016 14:03:55 Meagan Zavala ([id number]) Meagan, Zavala ([id number]) -------------------------------------------------------------------------------- Problem List Details Patient Name: Meagan Zavala I. Date of Service: 05/07/2016 1:30 PM Medical Record Number: [id number] Patient Account Number: [id number] Date of Birth/Sex: [birth year] (70 y.o. 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 Date of Service: 05/07/2016 1:30 PM Medical Record Number: [id number] Patient Account Number: [id number] Date of Birth/Sex: [birth year] (70 y.o. Female) Treating RN: Montey Hora Primary Care Physician: Deborra Medina Other Clinician: Referring Physician: Deborra Medina Treating Physician/Extender: Frann Rider in Treatment: 3 Subjective Chief  Complaint Information obtained from Patient Patient seen for complaints of Non-Healing Wound to the right of from licorice on her abdominal wall for about a month History of Present Illness (HPI) The following HPI elements were documented for the patient's wound: Location: umbilical region of the abdominal wall Quality: an open ulcerated area some amount of pain and burning Severity: Patient states wound are getting worse. Duration: Patient has had the wound for < 4 weeks prior to presenting for treatment Timing: Pain in wound is Intermittent (comes and goes Context: The wound appeared gradually over time Modifying Factors: Other treatment(s) tried include:and doxycycline the present time Associated Signs and Symptoms: Patient reports having increase discharge. 70 year old patient who was recently seen by Dr. Tommi Rumps was noted to have a small ulceration on abdomen wall. the patient has had it for about 6 weeks in the periumbilical areaThe patient noted that it has become slightly larger and draining purulent material.was started on doxycycline and given an appointment to the wound center. She does not recall if she had any insect  bite or injury her past medical history significant for GERD, hypothyroidism, osteoporosis, chronic kidney disease stage III, chronic low back pain, anemia, vitamin D deficiency and urge incontinence. 7/27//2017 -- pathology report of specimen done -- DIAGNOSIS: A. SKIN, ABDOMEN; BIOPSY: - CHRONIC ULCER. - NEGATIVE FOR MALIGNANCY Objective Constitutional Pulse regular. Respirations normal and unlabored. Afebrile. Vitals Time Taken: 1:44 PM, Height: 61 in, Weight: 125 lbs, BMI: 23.6, Temperature: 98.2 F, Pulse: 56 bpm, Respiratory Rate: 18 breaths/min, Blood Pressure: 101/52 mmHg. Meagan, Zavala I. ([id number]) Eyes Nonicteric. Reactive to light. Ears, Nose, Mouth, and Throat Lips, teeth, and gums WNL.Marland Kitchen Moist mucosa without lesions. Neck supple and  nontender. No palpable supraclavicular or cervical adenopathy. Normal sized without goiter. Respiratory WNL. No retractions.. Breath sounds WNL, No rubs, rales, rhonchi, or wheeze.. Cardiovascular Heart rhythm and rate regular, no murmur or gallop.. Pedal Pulses WNL. No clubbing, cyanosis or edema. Chest Breasts symmetical and no nipple discharge.. Breast tissue WNL, no masses, lumps, or tenderness.. Lymphatic No adneopathy. No adenopathy. No adenopathy. Musculoskeletal Adexa without tenderness or enlargement.. Digits and nails w/o clubbing, cyanosis, infection, petechiae, ischemia, or inflammatory conditions.Marland Kitchen Psychiatric Judgement and insight Intact.. No evidence of depression, anxiety, or agitation.. General Notes: debris at the base of the wound was washed out with moist saline gauze and there is healthy granulation tissue, though a bit fibrotic in places. Integumentary (Hair, Skin) No suspicious lesions. No crepitus or fluctuance. No peri-wound warmth or erythema. No masses.. Wound #1 status is Open. Original cause of wound was Gradually Appeared. The wound is located on the Right,Medial Abdomen - midline. The wound measures 0.5cm length x 2.2cm width x 0.1cm depth; 0.864cm^2 area and 0.086cm^3 volume. The wound is limited to skin breakdown. There is no tunneling or undermining noted. There is a medium amount of serous drainage noted. The wound margin is flat and intact. There is large (67-100%) red granulation within the wound bed. There is a small (1-33%) amount of necrotic tissue within the wound bed including Adherent Slough. The periwound skin appearance exhibited: Moist, Erythema. The periwound skin appearance did not exhibit: Callus, Crepitus, Excoriation, Fluctuance, Friable, Induration, Localized Edema, Rash, Scarring, Dry/Scaly, Maceration, Atrophie Blanche, Cyanosis, Ecchymosis, Hemosiderin Staining, Mottled, Pallor, Rubor. The surrounding wound skin color is noted  with erythema which is circumferential. Meagan, Zavala I. ([id number]) Assessment Active Problems ICD-10 S31.105A - Unspecified open wound of abdominal wall, periumbilic region without penetration into peritoneal cavity, initial encounter L02.221 - Furuncle of abdominal wall Plan Wound Cleansing: Wound #1 Right,Medial Abdomen - midline: Clean wound with Normal Saline. Anesthetic: Wound #1 Right,Medial Abdomen - midline: Topical Lidocaine 4% cream applied to wound bed prior to debridement Injected 2% Lidocaine without epinephrine prior to debridement Primary Wound Dressing: Wound #1 Right,Medial Abdomen - midline: Prisma Ag Secondary Dressing: Wound #1 Right,Medial Abdomen - midline: Boardered Foam Dressing Dressing Change Frequency: Wound #1 Right,Medial Abdomen - midline: Change dressing every other day. Follow-up Appointments: Wound #1 Right,Medial Abdomen - midline: Return Appointment in 1 week. Additional Orders / Instructions: Wound #1 Right,Medial Abdomen - midline: Increase protein intake. We will use Prisma Ag applications on alternate days after washing this well with soap and water and an appropriate dressing has been applied. She will come back and see as next week. Meagan, Zavala ([id number]) Electronic Signature(s) Signed: 05/07/2016 2:22:21 PM By: Christin Fudge MD, FACS Entered By: Christin Fudge on 05/07/2016 14:22:20 Meagan Zavala ([id number]) -------------------------------------------------------------------------------- SuperBill Details Patient Name: Meagan Zavala I. Date of Service: 05/07/2016 Medical Record Number: [id number]  Patient Account Number: [id number] Date of Birth/Sex: 12-09-1945 (70 y.o. Female) Treating RN: Montey Hora Primary Care Physician: Deborra Medina Other Clinician: Referring Physician: Deborra Medina Treating Physician/Extender: Frann Rider in Treatment: 3 Diagnosis Coding ICD-10 Codes Code  Description Unspecified open wound of abdominal wall, periumbilic region without penetration into S31.105A peritoneal cavity, initial encounter 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 Facility Procedures CPT4 Code: [id number] Description: [phone] - WOUND CARE VISIT-LEV 2 EST PT Modifier: Quantity: 1 Physician Procedures CPT4: Description Modifier Quantity Code [id number] - WC PHYS LEVEL 3 - EST PT 1 ICD-10 Description Diagnosis S31.105A Unspecified open wound of abdominal wall, periumbilic region without penetration into peritoneal cavity, initial encounter L02.221  Furuncle of abdominal wall Electronic Signature(s) Signed: 05/07/2016 2:22:53 PM By: Christin Fudge MD, FACS Previous Signature: 05/07/2016 2:22:35 PM Version By: Christin Fudge MD, FACS Entered By: Christin Fudge on 05/07/2016 14:22:52

## 2016-05-08 NOTE — Telephone Encounter (Signed)
Patient states that she was suppose to be referred to a gastroenterologist. She has not heard anything . Patient is requesting a call back. Her number [phone]. Thanks

## 2016-05-08 NOTE — Progress Notes (Signed)
Meagan Zavala ([id number]) Visit Report for 05/07/2016 Arrival Information Details Patient Name: Meagan Zavala, Meagan Zavala I. Date of Service: 05/07/2016 1:30 PM Medical Record Number: [id number] Patient Account Number: [id number] Date of Birth/Sex: 1946/06/27 (70 y.o. Female) Treating RN: Meagan Zavala Primary Care Physician: Meagan Zavala Other Clinician: Referring Physician: Deborra Zavala Treating Physician/Extender: Meagan Zavala in Treatment: 3 Visit Information History Since Last Visit Added or deleted any medications: No Patient Arrived: Ambulatory Any new allergies or adverse reactions: No Arrival Time: 13:44 Had a fall or experienced change in No Accompanied By: self activities of daily living that may affect Transfer Assistance: None risk of falls: Patient Identification Verified: Yes Signs or symptoms of abuse/neglect since last No Secondary Verification Process Yes visito Completed: Hospitalized since last visit: No Patient Requires Transmission-Based No Pain Present Now: No Precautions: Patient Has Alerts: No Electronic Signature(s) Signed: 05/07/2016 4:38:29 PM By: Meagan Zavala Entered By: Meagan Zavala on 05/07/2016 13:44:41 Meagan Zavala ([id number]) -------------------------------------------------------------------------------- Clinic Level of Care Assessment Details Patient Name: Meagan Zavala I. Date of Service: 05/07/2016 1:30 PM Medical Record Number: [id number] Patient Account Number: [id number] Date of Birth/Sex: [birth year] (70 y.o. Female) Treating RN: Meagan Zavala Primary Care Physician: Meagan Zavala Other Clinician: Referring Physician: Deborra Zavala Treating Physician/Extender: Meagan Zavala in Treatment: 3 Clinic Level of Care Assessment Items TOOL 4 Quantity Score []  - Use when only an EandM is performed on FOLLOW-UP visit 0 ASSESSMENTS - Nursing Assessment / Reassessment X - Reassessment of Co-morbidities (includes updates in  patient status) 1 10 X - Reassessment of Adherence to Treatment Plan 1 5 ASSESSMENTS - Wound and Skin Assessment / Reassessment X - Simple Wound Assessment / Reassessment - one wound 1 5 []  - Complex Wound Assessment / Reassessment - multiple wounds 0 []  - Dermatologic / Skin Assessment (not related to wound area) 0 ASSESSMENTS - Focused Assessment []  - Circumferential Edema Measurements - multi extremities 0 []  - Nutritional Assessment / Counseling / Intervention 0 []  - Lower Extremity Assessment (monofilament, tuning fork, pulses) 0 []  - Peripheral Arterial Disease Assessment (using hand held doppler) 0 ASSESSMENTS - Ostomy and/or Continence Assessment and Care []  - Incontinence Assessment and Management 0 []  - Ostomy Care Assessment and Management (repouching, etc.) 0 PROCESS - Coordination of Care X - Simple Patient / Family Education for ongoing care 1 15 []  - Complex (extensive) Patient / Family Education for ongoing care 0 []  - Staff obtains Programmer, systems, Records, Test Results / Process Orders 0 []  - Staff telephones HHA, Nursing Homes / Clarify orders / etc 0 []  - Routine Transfer to another Facility (non-emergent condition) 0 Frakes, [name] ([id number]) []  - Routine Hospital Admission (non-emergent condition) 0 []  - New Admissions / Biomedical engineer / Ordering NPWT, Apligraf, etc. 0 []  - Emergency Hospital Admission (emergent condition) 0 X - Simple Discharge Coordination 1 10 []  - Complex (extensive) Discharge Coordination 0 PROCESS - Special Needs []  - Pediatric / Minor Patient Management 0 []  - Isolation Patient Management 0 []  - Hearing / Language / Visual special needs 0 []  - Assessment of Community assistance (transportation, D/C planning, etc.) 0 []  - Additional assistance / Altered mentation 0 []  - Support Surface(s) Assessment (bed, cushion, seat, etc.) 0 INTERVENTIONS - Wound Cleansing / Measurement X - Simple Wound Cleansing - one wound 1 5 []  - Complex  Wound Cleansing - multiple wounds 0 X - Wound Imaging (photographs - any number of wounds) 1 5 []  - Wound Tracing (instead of photographs) 0 X -  Simple Wound Measurement - one wound 1 5 []  - Complex Wound Measurement - multiple wounds 0 INTERVENTIONS - Wound Dressings X - Small Wound Dressing one or multiple wounds 1 10 []  - Medium Wound Dressing one or multiple wounds 0 []  - Large Wound Dressing one or multiple wounds 0 []  - Application of Medications - topical 0 []  - Application of Medications - injection 0 INTERVENTIONS - Miscellaneous []  - External ear exam 0 Hence, [name] ([id number]) []  - Specimen Collection (cultures, biopsies, blood, body fluids, etc.) 0 []  - Specimen(s) / Culture(s) sent or taken to Lab for analysis 0 []  - Patient Transfer (multiple staff / Harrel Lemon Lift / Similar devices) 0 []  - Simple Staple / Suture removal (25 or less) 0 []  - Complex Staple / Suture removal (26 or more) 0 []  - Hypo / Hyperglycemic Management (close monitor of Blood Glucose) 0 []  - Ankle / Brachial Index (ABI) - do not check if billed separately 0 X - Vital Signs 1 5 Has the patient been seen at the hospital within the last three years: Yes Total Score: 75 Level Of Care: New/Established - Level 2 Electronic Signature(s) Signed: 05/07/2016 4:38:29 PM By: Meagan Zavala Entered By: Meagan Zavala on 05/07/2016 14:04:28 Meagan Zavala ([id number]) -------------------------------------------------------------------------------- Encounter Discharge Information Details Patient Name: Meagan Zavala I. Date of Service: 05/07/2016 1:30 PM Medical Record Number: [id number] Patient Account Number: [id number] Date of Birth/Sex: 06-15-1946 (70 y.o. Female) Treating RN: Meagan Zavala Primary Care Physician: Meagan Zavala Other Clinician: Referring Physician: Deborra Zavala Treating Physician/Extender: Meagan Zavala in Treatment: 3 Encounter Discharge Information Items Discharge Pain Level:  0 Discharge Condition: Stable Ambulatory Status: Ambulatory Discharge Destination: Home Transportation: Private Auto Accompanied By: self Schedule Follow-up Appointment: Yes Medication Reconciliation completed and provided to Patient/Care No Ishaan Villamar: Provided on Clinical Summary of Care: 05/07/2016 Form Type Recipient Paper Patient [facility] Electronic Signature(s) Signed: 05/07/2016 2:11:08 PM By: Ruthine Dose Entered By: Ruthine Dose on 05/07/2016 14:11:07 Meagan Zavala I. ([id number]) -------------------------------------------------------------------------------- Multi Wound Chart Details Patient Name: Meagan Zavala I. Date of Service: 05/07/2016 1:30 PM Medical Record Number: [id number] Patient Account Number: [id number] Date of Birth/Sex: 1945/10/01 (70 y.o. Female) Treating RN: Meagan Zavala Primary Care Physician: Meagan Zavala Other Clinician: Referring Physician: Deborra Zavala Treating Physician/Extender: Meagan Zavala in Treatment: 3 Vital Signs Height(in): 61 Pulse(bpm): 56 Weight(lbs): 125 Blood Pressure 101/52 (mmHg): Body Mass Index(BMI): 24 Temperature(F): 98.2 Respiratory Rate 18 (breaths/min): Photos: [N/A:N/A] Wound Location: Right Abdomen - midline - N/A N/A Medial Wounding Event: Gradually Appeared N/A N/A Primary Etiology: Abscess N/A N/A Comorbid History: Confinement Anxiety N/A N/A Date Acquired: 02/27/2016 N/A N/A Weeks of Treatment: 3 N/A N/A Wound Status: Open N/A N/A Measurements L x W x D 0.5x2.2x0.1 N/A N/A (cm) Area (cm) : 0.864 N/A N/A Volume (cm) : 0.086 N/A N/A % Reduction in Area: 12.00% N/A N/A % Reduction in Volume: 70.80% N/A N/A Classification: Partial Thickness N/A N/A Exudate Amount: Medium N/A N/A Exudate Type: Serous N/A N/A Exudate Color: amber N/A N/A Wound Margin: Flat and Intact N/A N/A Granulation Amount: Large (67-100%) N/A N/A Granulation Quality: Red N/A N/A Necrotic Amount: Small (1-33%) N/A  N/A Meagan Zavala, [name] ([id number]) Exposed Structures: Fascia: No N/A N/A Fat: No Tendon: No Muscle: No Joint: No Bone: No Limited to Skin Breakdown Epithelialization: Small (1-33%) N/A N/A Periwound Skin Texture: Edema: No N/A N/A Excoriation: No Induration: No Callus: No Crepitus: No Fluctuance: No Friable: No Rash: No Scarring: No Periwound Skin Moist: Yes N/A  N/A Moisture: Maceration: No Dry/Scaly: No Periwound Skin Color: Erythema: Yes N/A N/A Atrophie Blanche: No Cyanosis: No Ecchymosis: No Hemosiderin Staining: No Mottled: No Pallor: No Rubor: No Erythema Location: Circumferential N/A N/A Tenderness on No N/A N/A Palpation: Wound Preparation: Ulcer Cleansing: N/A N/A Rinsed/Irrigated with Saline Topical Anesthetic Applied: Other: lidocaine 4% Treatment Notes Electronic Signature(s) Signed: 05/07/2016 4:38:29 PM By: Meagan Zavala Entered By: Meagan Zavala on 05/07/2016 13:54:03 Meagan Zavala ([id number]) -------------------------------------------------------------------------------- [place] Details Patient Name: Meagan Zavala I. Date of Service: 05/07/2016 1:30 PM Medical Record Number: [id number] Patient Account Number: [id number] Date of Birth/Sex: [birth year] (70 y.o. Female) Treating RN: Meagan Zavala Primary Care Physician: Meagan Zavala Other Clinician: Referring Physician: Deborra Zavala Treating Physician/Extender: Meagan Zavala in Treatment: 3 Active Inactive Malignancy/Atypical Etiology Nursing Diagnoses: Knowledge deficit related to disease process and management of atypical ulcer etiology Goals: Patient/caregiver will verbalize understanding of disease process and disease management of atypical ulcer etiology Date Initiated: 04/16/2016 Goal Status: Active Interventions: Assess patient and family medical history for signs and symptoms of malignancy/atypical etiology upon admission Provide education on  atypical ulcer etiologies Provide education on malignant ulcerations Treatment Activities: Biopsy for Pathology : 04/16/2016 Test ordered outside of clinic : 04/16/2016 Notes: Nutrition Nursing Diagnoses: Potential for alteratiion in Nutrition/Potential for imbalanced nutrition Goals: Patient/caregiver agrees to and verbalizes understanding of need to use nutritional supplements and/or vitamins as prescribed Date Initiated: 04/16/2016 Goal Status: Active Interventions: Assess patient nutrition upon admission and as needed per policy Provide education on nutrition Meagan Zavala, [name] ([id number]) Notes: Orientation to the Wound Care Program Nursing Diagnoses: Knowledge deficit related to the wound healing center program Goals: Patient/caregiver will verbalize understanding of the [place] Program Date Initiated: 04/16/2016 Goal Status: Active Interventions: Provide education on orientation to the wound center Notes: Wound/Skin Impairment Nursing Diagnoses: Impaired tissue integrity Goals: Patient/caregiver will verbalize understanding of skin care regimen Date Initiated: 04/16/2016 Goal Status: Active Ulcer/skin breakdown will heal within 14 weeks Date Initiated: 04/16/2016 Goal Status: Active Interventions: Assess patient/caregiver ability to obtain necessary supplies Assess patient/caregiver ability to perform ulcer/skin care regimen upon admission and as needed Assess ulceration(s) every visit Treatment Activities: Skin care regimen initiated : 04/16/2016 Topical wound management initiated : 04/16/2016 Notes: Electronic Signature(s) Signed: 05/07/2016 4:38:29 PM By: Meagan Zavala Entered By: Meagan Zavala on 05/07/2016 13:53:49 Meagan Zavala ([id number]) -------------------------------------------------------------------------------- Pain Assessment Details Patient Name: Meagan Zavala I. Date of Service: 05/07/2016 1:30 PM Medical Record Number:  [id number] Patient Account Number: [id number] Date of Birth/Sex: [birth year] (70 y.o. Female) Treating RN: Meagan Zavala Primary Care Physician: Meagan Zavala Other Clinician: Referring Physician: Deborra Zavala Treating Physician/Extender: Meagan Zavala in Treatment: 3 Active Problems Location of Pain Severity and Description of Pain Patient Has Paino No Site Locations Pain Management and Medication Current Pain Management: Notes Topical or injectable lidocaine is offered to patient for acute pain when surgical debridement is performed. If needed, Patient is instructed to use over the counter pain medication for the following 24-48 hours after debridement. Wound care MDs do not prescribed pain medications. Patient has chronic pain or uncontrolled pain. Patient has been instructed to make an appointment with their Primary Care Physician for pain management. Electronic Signature(s) Signed: 05/07/2016 4:38:29 PM By: Meagan Zavala Entered By: Meagan Zavala on 05/07/2016 13:44:48 Meagan Zavala ([id number]) -------------------------------------------------------------------------------- Patient/Caregiver Education Details Patient Name: Meagan Zavala I. Date of Service: 05/07/2016 1:30 PM Medical Record Number: [id number] Patient Account Number: [id number] Date of Birth/Gender: 1946-05-04 (70 y.o. Female)  Treating RN: Meagan Zavala Primary Care Physician: Meagan Zavala Other Clinician: Referring Physician: Deborra Zavala Treating Physician/Extender: Meagan Zavala in Treatment: 3 Education Assessment Education Provided To: Patient Education Topics Provided Wound/Skin Impairment: Handouts: Other: wound care as ordered Methods: Demonstration, Explain/Verbal Responses: State content correctly Electronic Signature(s) Signed: 05/07/2016 4:38:29 PM By: Meagan Zavala Entered By: Meagan Zavala on 05/07/2016 13:54:27 Meagan Zavala  ([id number]) -------------------------------------------------------------------------------- Wound Assessment Details Patient Name: Meagan Zavala I. Date of Service: 05/07/2016 1:30 PM Medical Record Number: [id number] Patient Account Number: [id number] Date of Birth/Sex: 09-02-1946 (70 y.o. Female) Treating RN: Meagan Zavala Primary Care Physician: Meagan Zavala Other Clinician: Referring Physician: Deborra Zavala Treating Physician/Extender: Meagan Zavala in Treatment: 3 Wound Status Wound Number: 1 Primary Etiology: Abscess Wound Location: Right Abdomen - midline - Wound Status: Open Medial Comorbid History: Confinement Anxiety Wounding Event: Gradually Appeared Date Acquired: 02/27/2016 Weeks Of Treatment: 3 Clustered Wound: No Photos Wound Measurements Length: (cm) 0.5 Width: (cm) 2.2 Depth: (cm) 0.1 Area: (cm) 0.864 Volume: (cm) 0.086 % Reduction in Area: 12% % Reduction in Volume: 70.8% Epithelialization: Small (1-33%) Tunneling: No Undermining: No Wound Description Classification: Partial Thickness Wound Margin: Flat and Intact Exudate Amount: Medium Exudate Type: Serous Exudate Color: amber Foul Odor After Cleansing: No Wound Bed Granulation Amount: Large (67-100%) Exposed Structure Granulation Quality: Red Fascia Exposed: No Necrotic Amount: Small (1-33%) Fat Layer Exposed: No Necrotic Quality: Adherent Slough Tendon Exposed: No Meagan Zavala, [name] ([id number]) Muscle Exposed: No Joint Exposed: No Bone Exposed: No Limited to Skin Breakdown Periwound Skin Texture Texture Color No Abnormalities Noted: No No Abnormalities Noted: No Callus: No Atrophie Blanche: No Crepitus: No Cyanosis: No Excoriation: No Ecchymosis: No Fluctuance: No Erythema: Yes Friable: No Erythema Location: Circumferential Induration: No Hemosiderin Staining: No Localized Edema: No Mottled: No Rash: No Pallor: No Scarring: No Rubor: No Moisture No  Abnormalities Noted: No Dry / Scaly: No Maceration: No Moist: Yes Wound Preparation Ulcer Cleansing: Rinsed/Irrigated with Saline Topical Anesthetic Applied: Other: lidocaine 4%, Treatment Notes Wound #1 (Right, Medial Abdomen - midline) 1. Cleansed with: Clean wound with Normal Saline 2. Anesthetic Topical Lidocaine 4% cream to wound bed prior to debridement 3. Peri-wound Care: Skin Prep 4. Dressing Applied: Prisma Ag 5. Secondary Dressing Applied Bordered Foam Dressing Electronic Signature(s) Signed: 05/07/2016 4:38:29 PM By: Meagan Zavala Entered By: Meagan Zavala on 05/07/2016 13:52:53 Meagan Zavala ([id number]) -------------------------------------------------------------------------------- Vitals Details Patient Name: Meagan Zavala I. Date of Service: 05/07/2016 1:30 PM Medical Record Number: [id number] Patient Account Number: [id number] Date of Birth/Sex: 1946-09-01 (70 y.o. Female) Treating RN: Meagan Zavala Primary Care Physician: Meagan Zavala Other Clinician: Referring Physician: Deborra Zavala Treating Physician/Extender: Meagan Zavala in Treatment: 3 Vital Signs Time Taken: 13:44 Temperature (F): 98.2 Height (in): 61 Pulse (bpm): 56 Weight (lbs): 125 Respiratory Rate (breaths/min): 18 Body Mass Index (BMI): 23.6 Blood Pressure (mmHg): 101/52 Reference Range: 80 - 120 mg / dl Electronic Signature(s) Signed: 05/07/2016 4:38:29 PM By: Meagan Zavala Entered By: Meagan Zavala on 05/07/2016 13:47:02

## 2016-05-14 ENCOUNTER — Encounter (HOSPITAL_BASED_OUTPATIENT_CLINIC_OR_DEPARTMENT_OTHER): Payer: PPO | Admitting: General Surgery

## 2016-05-14 DIAGNOSIS — S31105A Unspecified open wound of abdominal wall, periumbilic region without penetration into peritoneal cavity, initial encounter: Secondary | ICD-10-CM

## 2016-05-14 NOTE — Progress Notes (Addendum)
Meagan Zavala ([id number]) Visit Report for 05/14/2016 Chief Complaint Document Details Patient Name: Meagan Zavala I. Date of Service: 05/14/2016 3:15 PM Medical Record Number: [id number] Patient Account Number: [id number] Date of Birth/Sex: Apr 14, 1946 (70 y.o. Female) Treating RN: Meagan Zavala Primary Care Physician: Meagan Zavala Other Clinician: Referring Physician: Deborra Zavala Treating Physician/Extender: Meagan Zavala in Treatment: 4 Information Obtained from: Patient Chief Complaint Patient seen for complaints of Non-Healing Wound to the right of from licorice on her abdominal wall for about a month Electronic Signature(s) Signed: 05/14/2016 3:25:24 PM By: Meagan Companion MD Entered By: Meagan Zavala on 05/14/2016 15:25:23 Meagan Zavala ([id number]) -------------------------------------------------------------------------------- HPI Details Patient Name: Meagan Austin I. Date of Service: 05/14/2016 3:15 PM Medical Record Number: [id number] Patient Account Number: [id number] Date of Birth/Sex: [birth year] (70 y.o. Female) Treating RN: Meagan Zavala Primary Care Physician: Meagan Zavala Other Clinician: Referring Physician: Deborra Zavala Treating Physician/Extender: Meagan Zavala in Treatment: 4 History of Present Illness Location: umbilical region of the abdominal wall Quality: an open ulcerated area some amount of pain and burning Severity: Patient states wound are getting worse. Duration: Patient has had the wound for < 4 weeks prior to presenting for treatment Timing: Pain in wound is Intermittent (comes and goes Context: The wound appeared gradually over time Modifying Factors: Other treatment(s) tried include:and doxycycline the present time Associated Signs and Symptoms: Patient reports having increase discharge. HPI Description: 70 year old patient who was recently seen by Dr. Tommi Zavala was noted to have a small ulceration on abdomen wall.  the patient has had it for about 6 weeks in the periumbilical areaThe patient noted that it has become slightly larger and draining purulent material.was started on doxycycline and given an appointment to the wound center. She does not recall if she had any insect bite or injury her past medical history significant for GERD, hypothyroidism, osteoporosis, chronic kidney disease stage III, chronic low back pain, anemia, vitamin D deficiency and urge incontinence. 7/27//2017 -- pathology report of specimen done -- DIAGNOSIS: A. SKIN, ABDOMEN; BIOPSY: - CHRONIC ULCER. - NEGATIVE FOR MALIGNANCY Electronic Signature(s) Signed: 05/14/2016 3:26:12 PM By: Meagan Companion MD Entered By: Meagan Zavala on 05/14/2016 15:26:12 Meagan Zavala ([id number]) -------------------------------------------------------------------------------- Physical Exam Details Patient Name: Meagan Austin I. Date of Service: 05/14/2016 3:15 PM Medical Record Number: [id number] Patient Account Number: [id number] Date of Birth/Sex: Apr 01, 1946 (70 y.o. Female) Treating RN: Meagan Zavala Primary Care Physician: Meagan Zavala Other Clinician: Referring Physician: Deborra Zavala Treating Physician/Extender: Meagan Zavala in Treatment: 4 Electronic Signature(s) Signed: 05/14/2016 3:26:23 PM By: Meagan Companion MD Entered By: Meagan Zavala on 05/14/2016 15:26:22 Meagan Zavala ([id number]) -------------------------------------------------------------------------------- Physician Orders Details Patient Name: Meagan Austin I. Date of Service: 05/14/2016 3:15 PM Medical Record Number: [id number] Patient Account Number: [id number] Date of Birth/Sex: 11-24-1945 (70 y.o. 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 Anesthetic Wound #1 Right,Medial Abdomen - midline o Topical Lidocaine 4% cream applied to wound bed prior to debridement o Injected 2% Lidocaine without epinephrine prior to debridement Primary Wound Dressing Wound #1 Right,Medial Abdomen - midline o Prisma Ag Secondary  Dressing Wound #1 Right,Medial Abdomen - midline o Dry Gauze o Boardered Foam Dressing Dressing Change Frequency Wound #1 Right,Medial Abdomen - midline o Change dressing every other day. Follow-up Appointments Wound #1 Right,Medial Abdomen - midline o Return Appointment in 1 week. Additional Orders / Instructions Wound #1 Right,Medial Abdomen - midline o Increase protein intake. Meagan Zavala ([id number]) Electronic Signature(s) Signed: 05/14/2016 4:58:27 PM By: Meagan Zavala Signed: 05/15/2016 8:09:12 AM By: Meagan Companion MD Entered By: Meagan Zavala on 05/14/2016 15:58:36 Meagan Zavala ([id number]) -------------------------------------------------------------------------------- Problem List Details Patient Name: Meagan Austin I. Date of Service: 05/14/2016 3:15 PM Medical Record Number: [id number] Patient Account Number: [id number] Date of Birth/Sex: [birth year] (70 y.o. 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 Date of Service: 05/14/2016 3:15 PM Medical Record Number: [id number] Patient Account Number: [id number] Date of Birth/Sex: 29-Aug-1946 (70 y.o. Female) Treating RN: Primary Care Physician: Meagan Zavala Other Clinician: Referring Physician: Deborra Zavala Treating Physician/Extender: Suella [place] in Treatment: 4 Subjective Chief Complaint Information obtained from Patient Patient seen for complaints of Non-Healing Wound to the right of from licorice on her abdominal wall for about a month History of Present Illness (HPI) The following HPI elements were documented for the patient's wound: Location: umbilical region of the abdominal wall Quality: an open ulcerated area some amount of pain and burning Severity: Patient states wound are getting worse. Duration: Patient has had the wound for < 4 weeks prior to presenting for treatment Timing: Pain in wound is Intermittent (comes and goes Context: The wound appeared gradually over time Modifying Factors: Other treatment(s) tried include:and doxycycline the present time Associated Signs and Symptoms: Patient reports having increase discharge. 70 year old patient who was recently seen by Dr. Tommi Zavala was noted to have a small ulceration on abdomen wall. the patient has had it for about 6 weeks in the periumbilical areaThe patient noted that it has become slightly larger and draining purulent material.was started on doxycycline and given an  appointment to the wound center. She does not recall if she had any insect bite or injury her past medical history significant for GERD, hypothyroidism, osteoporosis, chronic kidney disease stage III, chronic low back pain, anemia, vitamin D deficiency and urge incontinence. 7/27//2017 -- pathology report of specimen done -- DIAGNOSIS: A. SKIN, ABDOMEN; BIOPSY: - CHRONIC ULCER. - NEGATIVE FOR MALIGNANCY Assessment Active Problems ICD-10 S31.105A - Unspecified open wound of abdominal wall, periumbilic region without penetration into peritoneal cavity, initial encounter L02.221 - Furuncle of abdominal wall Zavala, [name] ([id number]) Plan @x1  cm ulcer to the right of umbilicus. No trauma. Will continue to Rx with collagen Electronic Signature(s) Signed: 05/14/2016 3:47:58 PM By: Meagan Companion MD Entered By: Meagan Zavala on 05/14/2016 15:47:58 Meagan Zavala ([id number]) -------------------------------------------------------------------------------- SuperBill Details Patient Name: Meagan Austin I. Date of Service: 05/14/2016 Medical Record Number: [id number] Patient Account Number: [id number] Date of Birth/Sex: Jun 18, 1946 (70 y.o. 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 Facility Procedures CPT4 Code: [id number] Description: 99213 - WOUND CARE VISIT-LEV 3 EST PT Modifier: Quantity: 1 Physician Procedures CPT4: Description Modifier Quantity Code [id number] - WC PHYS LEVEL 2 - EST PT 1 ICD-10 Description Diagnosis S31.105A Unspecified open wound of abdominal wall, periumbilic region without penetration into  peritoneal cavity, initial encounter Electronic Signature(s) Signed: 05/14/2016 4:58:27 PM By: Meagan Zavala Signed: 05/15/2016 8:09:12 AM By: Meagan Companion MD Previous Signature: 05/14/2016 3:48:20 PM Version By: Meagan Companion MD Entered By: Meagan Zavala on 05/14/2016 15:59:40

## 2016-05-14 NOTE — Progress Notes (Addendum)
Meagan, Zavala ([id number]) Visit Report for 05/14/2016 Arrival Information Details Patient Name: Meagan Zavala, Meagan Zavala I. Date of Service: 05/14/2016 3:15 PM Medical Record Number: [id number] Patient Account Number: [id number] Date of Birth/Sex: January 01, 1946 (70 y.o. Female) Treating RN: Ahmed Prima Primary Care Physician: Deborra Medina Other Clinician: Referring Physician: Deborra Medina Treating Physician/Extender: Benjaman Pott in Treatment: 4 Visit Information History Since Last Visit All ordered tests and consults were completed: No Patient Arrived: Ambulatory Added or deleted any medications: No Arrival Time: 15:25 Any new allergies or adverse reactions: No Accompanied By: self Had a fall or experienced change in No Transfer Assistance: None activities of daily living that may affect Patient Identification Verified: Yes risk of falls: Secondary Verification Process Yes Signs or symptoms of abuse/neglect since last No Completed: visito Patient Requires Transmission-Based No Hospitalized since last visit: No Precautions: Pain Present Now: No Patient Has Alerts: No Electronic Signature(s) Signed: 05/14/2016 4:58:27 PM By: Alric Quan Entered By: Alric Quan on 05/14/2016 15:27:23 [name] -------------------------------------------------------------------------------- Clinic Level of Care Assessment Details Patient Name: Meagan Zavala I. Date of Service: 05/14/2016 3:15 PM Medical Record Number: [id number] Patient Account Number: [id number] Date of Birth/Sex: 12/19/1945 (70 y.o. Female) Treating RN: Carolyne Fiscal, Debi Primary Care Physician: Deborra Medina Other Clinician: Referring Physician: Deborra Medina Treating Physician/Extender: Benjaman Pott in Treatment: 4 Clinic Level of Care Assessment Items TOOL 4 Quantity Score X - Use when only an EandM is performed on FOLLOW-UP visit 1 0 ASSESSMENTS - Nursing Assessment /  Reassessment X - Reassessment of Co-morbidities (includes updates in patient status) 1 10 X - Reassessment of Adherence to Treatment Plan 1 5 ASSESSMENTS - Wound and Skin Assessment / Reassessment X - Simple Wound Assessment / Reassessment - one wound 1 5 []  - Complex Wound Assessment / Reassessment - multiple wounds 0 []  - Dermatologic / Skin Assessment (not related to wound area) 0 ASSESSMENTS - Focused Assessment []  - Circumferential Edema Measurements - multi extremities 0 []  - Nutritional Assessment / Counseling / Intervention 0 []  - Lower Extremity Assessment (monofilament, tuning fork, pulses) 0 []  - Peripheral Arterial Disease Assessment (using hand held doppler) 0 ASSESSMENTS - Ostomy and/or Continence Assessment and Care []  - Incontinence Assessment and Management 0 []  - Ostomy Care Assessment and Management (repouching, etc.) 0 PROCESS - Coordination of Care X - Simple Patient / Family Education for ongoing care 1 15 []  - Complex (extensive) Patient / Family Education for ongoing care 0 []  - Staff obtains Programmer, systems, Records, Test Results / Process Orders 0 []  - Staff telephones HHA, Nursing Homes / Clarify orders / etc 0 []  - Routine Transfer to another Facility (non-emergent condition) 0 Stange, Katelan I. ([id number]) []  - Routine Hospital Admission (non-emergent condition) 0 []  - New Admissions / Biomedical engineer / Ordering NPWT, Apligraf, etc. 0 []  - Emergency Hospital Admission (emergent condition) 0 X - Simple Discharge Coordination 1 10 []  - Complex (extensive) Discharge Coordination 0 PROCESS - Special Needs []  - Pediatric / Minor Patient Management 0 []  - Isolation Patient Management 0 []  - Hearing / Language / Visual special needs 0 []  - Assessment of Community assistance (transportation, D/C planning, etc.) 0 []  - Additional assistance / Altered mentation 0 []  - Support Surface(s) Assessment (bed, cushion, seat, etc.) 0 INTERVENTIONS - Wound Cleansing /  Measurement X - Simple Wound Cleansing - one wound 1 5 []  - Complex Wound Cleansing - multiple wounds 0 X - Wound Imaging (photographs - any number of wounds) 1 5 []  -  Wound Tracing (instead of photographs) 0 X - Simple Wound Measurement - one wound 1 5 []  - Complex Wound Measurement - multiple wounds 0 INTERVENTIONS - Wound Dressings X - Small Wound Dressing one or multiple wounds 1 10 []  - Medium Wound Dressing one or multiple wounds 0 []  - Large Wound Dressing one or multiple wounds 0 X - Application of Medications - topical 1 5 []  - Application of Medications - injection 0 INTERVENTIONS - Miscellaneous []  - External ear exam 0 Maulden, Cyerra I. ([id number]) []  - Specimen Collection (cultures, biopsies, blood, body fluids, etc.) 0 []  - Specimen(s) / Culture(s) sent or taken to Lab for analysis 0 []  - Patient Transfer (multiple staff / Harrel Lemon Lift / Similar devices) 0 []  - Simple Staple / Suture removal (25 or less) 0 []  - Complex Staple / Suture removal (26 or more) 0 []  - Hypo / Hyperglycemic Management (close monitor of Blood Glucose) 0 []  - Ankle / Brachial Index (ABI) - do not check if billed separately 0 X - Vital Signs 1 5 Has the patient been seen at the hospital within the last three years: Yes Total Score: 80 Level Of Care: New/Established - Level 3 Electronic Signature(s) Signed: 05/14/2016 4:58:27 PM By: Alric Quan Entered By: Alric Quan on 05/14/2016 15:59:31 [name] ([id number]) -------------------------------------------------------------------------------- Encounter Discharge Information Details Patient Name: Meagan Zavala I. Date of Service: 05/14/2016 3:15 PM Medical Record Number: [id number] Patient Account Number: [id number] Date of Birth/Sex: 05/25/1946 (70 y.o. Female) Treating RN: Ahmed Prima Primary Care Physician: Deborra Medina Other Clinician: Referring Physician: Deborra Medina Treating Physician/Extender: Benjaman Pott  in Treatment: 4 Encounter Discharge Information Items Discharge Pain Level: 0 Discharge Condition: Stable Ambulatory Status: Ambulatory Discharge Destination: Home Transportation: Private Auto Accompanied By: self Schedule Follow-up Appointment: Yes Medication Reconciliation completed and provided to Patient/Care Yes Adi Doro: Provided on Clinical Summary of Care: 05/14/2016 Form Type Recipient Paper Patient [facility] Electronic Signature(s) Signed: 05/14/2016 3:54:58 PM By: Ruthine Dose Previous Signature: 05/14/2016 3:49:09 PM Version By: Judene Companion MD Entered By: Ruthine Dose on 05/14/2016 15:54:58 [name] ([id number]) -------------------------------------------------------------------------------- Lower Extremity Assessment Details Patient Name: Meagan Zavala I. Date of Service: 05/14/2016 3:15 PM Medical Record Number: [id number] Patient Account Number: [id number] Date of Birth/Sex: 1946-05-27 (70 y.o. Female) Treating RN: Carolyne Fiscal, Debi Primary Care Physician: Deborra Medina Other Clinician: Referring Physician: Deborra Medina Treating Physician/Extender: Judene Companion Weeks in Treatment: 4 Electronic Signature(s) Signed: 05/14/2016 4:58:27 PM By: Alric Quan Entered By: Alric Quan on 05/14/2016 15:30:09 Meagan Zavala IMarland Kitchen ([id number]) -------------------------------------------------------------------------------- Multi Wound Chart Details Patient Name: Meagan Zavala I. Date of Service: 05/14/2016 3:15 PM Medical Record Number: [id number] Patient Account Number: [id number] Date of Birth/Sex: [birth year] (70 y.o. 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] [N/A:N/A] Date Acquired: [1:02/27/2016] [N/A:N/A] Weeks of Treatment: [1:4] [N/A:N/A] Wound Status: [1:Open] [N/A:N/A] Measurements L x W x D 0.6x2.6x0.2 [N/A:N/A] (cm) Area (cm) : [1:1.225] [N/A:N/A] Volume (cm) : [1:0.245] [N/A:N/A] % Reduction in Area: [1:-24.70%] [N/A:N/A] % Reduction in Volume: 16.90% [N/A:N/A] Classification: [1:Partial Thickness] [N/A:N/A] Exudate Amount: [1:Medium] [N/A:N/A] Exudate Type: [1:Serous] [N/A:N/A] Exudate Color: [1:amber] [N/A:N/A] Wound Margin: [  1:Flat and Intact] [N/A:N/A] Granulation Amount: [1:Medium (34-66%)] [N/A:N/A] Granulation Quality: [1:Red] [N/A:N/A] Necrotic Amount: [1:Medium (34-66%)] [N/A:N/A] Exposed Structures: [1:Fascia: No Fat: No Tendon: No Muscle: No Joint: No Bone: No] [N/A:N/A] Limited to Skin Breakdown Epithelialization: Small (1-33%) N/A N/A Periwound Skin Texture: Edema: No N/A N/A Excoriation: No Induration: No Callus: No Crepitus: No Fluctuance: No Friable: No Rash: No Scarring: No Periwound Skin Moist: Yes N/A N/A Moisture: Maceration: No Dry/Scaly: No Periwound Skin Color: Erythema: Yes N/A N/A Atrophie Blanche: No Cyanosis: No Ecchymosis: No Hemosiderin Staining: No Mottled: No Pallor: No Rubor: No Erythema Location: Circumferential N/A N/A Tenderness on No N/A N/A Palpation: Wound Preparation: Ulcer Cleansing: N/A N/A Rinsed/Irrigated with Saline Topical Anesthetic Applied: Other: lidocaine 4% Treatment Notes Electronic Signature(s) Signed: 05/14/2016 4:58:27 PM By: Alric Quan Entered By: Alric Quan on 05/14/2016 15:41:16 [name]  ([id number]) -------------------------------------------------------------------------------- [place] Details Patient Name: Meagan Zavala I. Date of Service: 05/14/2016 3:15 PM Medical Record Number: [id number] Patient Account Number: [id number] Date of Birth/Sex: April 26, 1946 (70 y.o. Female) Treating RN: Carolyne Fiscal, Debi Primary Care Physician: Deborra Medina Other Clinician: Referring Physician: Deborra Medina Treating Physician/Extender: Benjaman Pott in Treatment: 4 Active Inactive Malignancy/Atypical Etiology Nursing Diagnoses: Knowledge deficit related to disease process and management of atypical ulcer etiology Goals: Patient/caregiver will verbalize understanding of disease process and disease management of atypical ulcer etiology Date Initiated: 04/16/2016 Goal Status: Active Interventions: Assess patient and family medical history for signs and symptoms of malignancy/atypical etiology upon admission Provide education on atypical ulcer etiologies Provide education on malignant ulcerations Treatment Activities: Biopsy for Pathology : 04/16/2016 Test ordered outside of clinic : 04/16/2016 Notes: Nutrition Nursing Diagnoses: Potential for alteratiion in Nutrition/Potential for imbalanced nutrition Goals: Patient/caregiver agrees to and verbalizes understanding of need to use nutritional supplements and/or vitamins as prescribed Date Initiated: 04/16/2016 Goal Status: Active Interventions: Assess patient nutrition upon admission and as needed per policy Provide education on nutrition ATIYAH, HIGGENS I. ([id number]) Notes: Orientation to the Wound Care Program Nursing Diagnoses: Knowledge deficit related to the wound healing center program Goals: Patient/caregiver will verbalize understanding of the [place] Program Date Initiated: 04/16/2016 Goal Status: Active Interventions: Provide education on orientation to the wound  center Notes: Wound/Skin Impairment Nursing Diagnoses: Impaired tissue integrity Goals: Patient/caregiver will verbalize understanding of skin care regimen Date Initiated: 04/16/2016 Goal Status: Active Ulcer/skin breakdown will heal within 14 weeks Date Initiated: 04/16/2016 Goal Status: Active Interventions: Assess patient/caregiver ability to obtain necessary supplies Assess patient/caregiver ability to perform ulcer/skin care regimen upon admission and as needed Assess ulceration(s) every visit Treatment Activities: Skin care regimen initiated : 04/16/2016 Topical wound management initiated : 04/16/2016 Notes: Electronic Signature(s) Signed: 05/14/2016 4:58:27 PM By: Alric Quan Entered By: Alric Quan on 05/14/2016 15:41:08 Meagan Zavala I. ([id number]) -------------------------------------------------------------------------------- Pain Assessment Details Patient Name: Meagan Zavala I. Date of Service: 05/14/2016 3:15 PM Medical Record Number: [id number] Patient Account Number: [id number] Date of Birth/Sex: 17-Aug-1946 (70 y.o. Female) Treating RN: Ahmed Prima Primary Care Physician: Deborra Medina Other Clinician: Referring Physician: Deborra Medina Treating Physician/Extender: Benjaman Pott in Treatment: 4 Active Problems Location of Pain Severity and Description of Pain Patient Has Paino No Site Locations With Dressing Change: No Pain Management and Medication Current Pain Management: Electronic Signature(s) Signed: 05/14/2016 4:58:27 PM By: Alric Quan Entered By: Alric Quan on 05/14/2016 15:27:32 [name] -------------------------------------------------------------------------------- Patient/Caregiver Education Details Patient Name: Meagan Zavala I. Date of Service: 05/14/2016 3:15 PM Medical Record Number: [id number] Patient Account Number: [id number]  Date of Birth/Gender: 1946/08/31 (70 y.o. Female) Treating RN:  Ahmed Prima Primary Care Physician: Deborra Medina Other Clinician: Referring Physician: Deborra Medina Treating Physician/Extender: Benjaman Pott in Treatment: 4 Education Assessment Education Provided To: Patient Education Topics Provided Wound/Skin Impairment: Handouts: Other: change dressing as ordered Methods: Demonstration, Explain/Verbal Responses: State content correctly Electronic Signature(s) Signed: 05/15/2016 8:09:12 AM By: Judene Companion MD Entered By: Judene Companion on 05/14/2016 15:49:18 [name] -------------------------------------------------------------------------------- Wound Assessment Details Patient Name: Meagan Zavala I. Date of Service: 05/14/2016 3:15 PM Medical Record Number: [id number] Patient Account Number: [id number] Date of Birth/Sex: 10-04-1945 (70 y.o. Female) Treating RN: Carolyne Fiscal, Debi Primary Care Physician: Deborra Medina Other Clinician: Referring Physician: Deborra Medina Treating Physician/Extender: Benjaman Pott in Treatment: 4 Wound Status Wound Number: 1 Primary Etiology: Abscess Wound Location: Right Abdomen - midline - Wound Status: Open Medial Comorbid History: Confinement Anxiety Wounding Event: Gradually Appeared Date Acquired: 02/27/2016 Weeks Of Treatment: 4 Clustered Wound: No Photos Photo Uploaded By: Alric Quan on 05/14/2016 16:50:30 Wound Measurements Length: (cm) 0.6 Width: (cm) 2.6 Depth: (cm) 0.2 Area: (cm) 1.225 Volume: (cm) 0.245 % Reduction in Area: -24.7% % Reduction in Volume: 16.9% Epithelialization: Small (1-33%) Tunneling: No Undermining: No Wound Description Classification: Partial Thickness Wound Margin: Flat and Intact Exudate Amount: Medium Exudate Type: Serous Exudate Color: amber Foul Odor After Cleansing: No Wound Bed Granulation Amount: Medium (34-66%) Exposed Structure Granulation Quality: Red Fascia Exposed: No Necrotic Amount: Medium  (34-66%) Fat Layer Exposed: No Vrooman, Dahiana I. ([id number]) Necrotic Quality: Adherent Slough Tendon Exposed: No Muscle Exposed: No Joint Exposed: No Bone Exposed: No Limited to Skin Breakdown Periwound Skin Texture Texture Color No Abnormalities Noted: No No Abnormalities Noted: No Callus: No Atrophie Blanche: No Crepitus: No Cyanosis: No Excoriation: No Ecchymosis: No Fluctuance: No Erythema: Yes Friable: No Erythema Location: Circumferential Induration: No Hemosiderin Staining: No Localized Edema: No Mottled: No Rash: No Pallor: No Scarring: No Rubor: No Moisture No Abnormalities Noted: No Dry / Scaly: No Maceration: No Moist: Yes Wound Preparation Ulcer Cleansing: Rinsed/Irrigated with Saline Topical Anesthetic Applied: Other: lidocaine 4%, Treatment Notes Wound #1 (Right, Medial Abdomen - midline) 1. Cleansed with: Clean wound with Normal Saline 2. Anesthetic Topical Lidocaine 4% cream to wound bed prior to debridement 3. Peri-wound Care: Skin Prep 4. Dressing Applied: Prisma Ag 5. Secondary Dressing Applied Bordered Foam Dressing Dry Gauze Electronic Signature(s) Signed: 05/14/2016 4:58:27 PM By: Alric Quan Entered By: Alric Quan on 05/14/2016 15:35:35 [name] [place], Livingston Wheeler IMarland Kitchen ([id number]) -------------------------------------------------------------------------------- Vitals Details Patient Name: Meagan Zavala I. Date of Service: 05/14/2016 3:15 PM Medical Record Number: [id number] Patient Account Number: [id number] Date of Birth/Sex: May 18, 1946 (70 y.o. Female) Treating RN: Carolyne Fiscal, Debi Primary Care Physician: Deborra Medina Other Clinician: Referring Physician: Deborra Medina Treating Physician/Extender: Benjaman Pott in Treatment: 4 Vital Signs Time Taken: 15:28 Temperature (F): 97.9 Height (in): 61 Pulse (bpm): 56 Weight (lbs): 125 Respiratory Rate (breaths/min): 18 Body Mass Index (BMI):  23.6 Blood Pressure (mmHg): 96/56 Reference Range: 80 - 120 mg / dl Electronic Signature(s) Signed: 05/14/2016 4:58:27 PM By: Alric Quan Entered By: Alric Quan on 05/14/2016 15:29:25

## 2016-05-14 NOTE — Progress Notes (Signed)
See I heal note 

## 2016-05-15 ENCOUNTER — Telehealth: Payer: Self-pay | Admitting: Internal Medicine

## 2016-05-15 MED ORDER — HYDROCODONE-ACETAMINOPHEN 10-325 MG PO TABS: 1.0000 | ORAL_TABLET | Freq: Two times a day (BID) | ORAL | 0 refills | Status: DC

## 2016-05-15 NOTE — Telephone Encounter (Signed)
Pt called about needing a refill for HYDROcodone-acetaminophen (NORCO) 10-325 MG tablet. Please call when Rx is ready for pick up. Thank you!  Call pt @ [phone].

## 2016-05-15 NOTE — Telephone Encounter (Signed)
Refill request for Hydrocodone, last seen ID:[id number], last filled [id number].  Please advise.

## 2016-05-15 NOTE — Telephone Encounter (Signed)
Error sending to [employer]

## 2016-05-15 NOTE — Telephone Encounter (Signed)
Refill for 30 days only.  OFFICE VISIT NEEDED prior to any more refills 

## 2016-05-19 ENCOUNTER — Other Ambulatory Visit (INDEPENDENT_AMBULATORY_CARE_PROVIDER_SITE_OTHER): Payer: PPO

## 2016-05-19 DIAGNOSIS — E038 Other specified hypothyroidism: Secondary | ICD-10-CM

## 2016-05-19 DIAGNOSIS — E034 Atrophy of thyroid (acquired): Secondary | ICD-10-CM

## 2016-05-19 DIAGNOSIS — E559 Vitamin D deficiency, unspecified: Secondary | ICD-10-CM | POA: Diagnosis not present

## 2016-05-19 DIAGNOSIS — D638 Anemia in other chronic diseases classified elsewhere: Secondary | ICD-10-CM

## 2016-05-19 LAB — CBC WITH DIFFERENTIAL/PLATELET
BASOS PCT: 0.6 % (ref 0.0–3.0)
Basophils Absolute: 0 10*3/uL (ref 0.0–0.1)
EOS ABS: 0.1 10*3/uL (ref 0.0–0.7)
EOS PCT: 1.8 % (ref 0.0–5.0)
Hemoglobin: 8.6 g/dL — ABNORMAL LOW (ref 12.0–15.0)
Lymphocytes Relative: 36.4 % (ref 12.0–46.0)
Lymphs Abs: 1.7 10*3/uL (ref 0.7–4.0)
MCHC: 32.3 g/dL (ref 30.0–36.0)
MCV: 85.7 fl (ref 78.0–100.0)
MONO ABS: 0.5 10*3/uL (ref 0.1–1.0)
Monocytes Relative: 10.2 % (ref 3.0–12.0)
NEUTROS ABS: 2.4 10*3/uL (ref 1.4–7.7)
Neutrophils Relative %: 51 % (ref 43.0–77.0)
PLATELETS: 240 10*3/uL (ref 150.0–400.0)
RBC: 3.12 Mil/uL — ABNORMAL LOW (ref 3.87–5.11)
RDW: 14.4 % (ref 11.5–15.5)
WBC: 4.6 10*3/uL (ref 4.0–10.5)

## 2016-05-19 LAB — TSH: TSH: 6.81 u[IU]/mL — ABNORMAL HIGH (ref 0.35–4.50)

## 2016-05-19 LAB — VITAMIN D 25 HYDROXY (VIT D DEFICIENCY, FRACTURES): VITD: 68.07 ng/mL (ref 30.00–100.00)

## 2016-05-20 LAB — FOLATE RBC: RBC Folate: 1566 ng/mL (ref >280)

## 2016-05-21 ENCOUNTER — Encounter: Payer: PPO | Admitting: Surgery

## 2016-05-21 ENCOUNTER — Other Ambulatory Visit: Payer: Self-pay | Admitting: Internal Medicine

## 2016-05-21 DIAGNOSIS — S31105A Unspecified open wound of abdominal wall, periumbilic region without penetration into peritoneal cavity, initial encounter: Secondary | ICD-10-CM | POA: Diagnosis not present

## 2016-05-21 DIAGNOSIS — D508 Other iron deficiency anemias: Secondary | ICD-10-CM

## 2016-05-21 LAB — ERYTHROPOIETIN: Erythropoietin: 116.3 m[IU]/mL — ABNORMAL HIGH (ref 2.6–18.5)

## 2016-05-22 ENCOUNTER — Encounter: Payer: Self-pay | Admitting: Internal Medicine

## 2016-05-22 NOTE — Progress Notes (Signed)
Meagan Zavala, Meagan Zavala ([id number]) Visit Report for 05/21/2016 Arrival Information Details Patient Name: Meagan Zavala, [name] Date of Service: 05/21/2016 3:00 PM Medical Record Number: [id number] Patient Account Number: [id number] Date of Birth/Sex: 1946/07/31 (70 y.o. Female) Treating RN: Montey Hora Primary Care Physician: Deborra Medina Other Clinician: Referring Physician: Deborra Medina Treating Physician/Extender: Frann Rider in Treatment: 5 Visit Information History Since Last Visit Added or deleted any medications: No Patient Arrived: Ambulatory Any new allergies or adverse reactions: No Arrival Time: 15:00 Had a fall or experienced change in No Accompanied By: self activities of daily living that may affect Transfer Assistance: None risk of falls: Patient Identification Verified: Yes Signs or symptoms of abuse/neglect since last No Secondary Verification Process Yes visito Completed: Hospitalized since last visit: No Patient Requires Transmission-Based No Pain Present Now: No Precautions: Patient Has Alerts: No Electronic Signature(s) Signed: 05/21/2016 4:59:26 PM By: Montey Hora Entered By: Montey Hora on 05/21/2016 15:00:41 Meagan Zavala ([id number]) -------------------------------------------------------------------------------- Clinic Level of Care Assessment Details Patient Name: Meagan Austin I. Date of Service: 05/21/2016 3:00 PM Medical Record Number: [id number] Patient Account Number: [id number] Date of Birth/Sex: January 15, 1946 (70 y.o. Female) Treating RN: Afful, RN, BSN, [place] Primary Care Physician: Deborra Medina Other Clinician: Referring Physician: Deborra Medina Treating Physician/Extender: Frann Rider in Treatment: 5 Clinic Level of Care Assessment Items TOOL 4 Quantity Score []  - Use when only an EandM is performed on FOLLOW-UP visit 0 ASSESSMENTS - Nursing Assessment / Reassessment X - Reassessment of Co-morbidities (includes  updates in patient status) 1 10 X - Reassessment of Adherence to Treatment Plan 1 5 ASSESSMENTS - Wound and Skin Assessment / Reassessment X - Simple Wound Assessment / Reassessment - one wound 1 5 []  - Complex Wound Assessment / Reassessment - multiple wounds 0 []  - Dermatologic / Skin Assessment (not related to wound area) 0 ASSESSMENTS - Focused Assessment []  - Circumferential Edema Measurements - multi extremities 0 []  - Nutritional Assessment / Counseling / Intervention 0 []  - Lower Extremity Assessment (monofilament, tuning fork, pulses) 0 []  - Peripheral Arterial Disease Assessment (using hand held doppler) 0 ASSESSMENTS - Ostomy and/or Continence Assessment and Care []  - Incontinence Assessment and Management 0 []  - Ostomy Care Assessment and Management (repouching, etc.) 0 PROCESS - Coordination of Care X - Simple Patient / Family Education for ongoing care 1 15 []  - Complex (extensive) Patient / Family Education for ongoing care 0 []  - Staff obtains Programmer, systems, Records, Test Results / Process Orders 0 []  - Staff telephones HHA, Nursing Homes / Clarify orders / etc 0 []  - Routine Transfer to another Facility (non-emergent condition) 0 Meagan Zavala, [name] ([id number]) []  - Routine Hospital Admission (non-emergent condition) 0 []  - New Admissions / Biomedical engineer / Ordering NPWT, Apligraf, etc. 0 []  - Emergency Hospital Admission (emergent condition) 0 []  - Simple Discharge Coordination 0 []  - Complex (extensive) Discharge Coordination 0 PROCESS - Special Needs []  - Pediatric / Minor Patient Management 0 []  - Isolation Patient Management 0 []  - Hearing / Language / Visual special needs 0 []  - Assessment of Community assistance (transportation, D/C planning, etc.) 0 []  - Additional assistance / Altered mentation 0 []  - Support Surface(s) Assessment (bed, cushion, seat, etc.) 0 INTERVENTIONS - Wound Cleansing / Measurement X - Simple Wound Cleansing - one wound 1 5 []  -  Complex Wound Cleansing - multiple wounds 0 X - Wound Imaging (photographs - any number of wounds) 1 5 []  - Wound Tracing (instead of photographs) 0 X -  Simple Wound Measurement - one wound 1 5 []  - Complex Wound Measurement - multiple wounds 0 INTERVENTIONS - Wound Dressings X - Small Wound Dressing one or multiple wounds 1 10 []  - Medium Wound Dressing one or multiple wounds 0 []  - Large Wound Dressing one or multiple wounds 0 []  - Application of Medications - topical 0 []  - Application of Medications - injection 0 INTERVENTIONS - Miscellaneous []  - External ear exam 0 Meagan Zavala, [name] ([id number]) []  - Specimen Collection (cultures, biopsies, blood, body fluids, etc.) 0 []  - Specimen(s) / Culture(s) sent or taken to Lab for analysis 0 []  - Patient Transfer (multiple staff / Harrel Lemon Lift / Similar devices) 0 []  - Simple Staple / Suture removal (25 or less) 0 []  - Complex Staple / Suture removal (26 or more) 0 []  - Hypo / Hyperglycemic Management (close monitor of Blood Glucose) 0 []  - Ankle / Brachial Index (ABI) - do not check if billed separately 0 X - Vital Signs 1 5 Has the patient been seen at the hospital within the last three years: Yes Total Score: 65 Level Of Care: New/Established - Level 2 Electronic Signature(s) Signed: 05/21/2016 5:25:53 PM By: Regan Lemming BSN, RN Entered By: Regan Lemming on 05/21/2016 15:13:14 Meagan Zavala ([id number]) -------------------------------------------------------------------------------- Encounter Discharge Information Details Patient Name: Meagan Austin I. Date of Service: 05/21/2016 3:00 PM Medical Record Number: [id number] Patient Account Number: [id number] Date of Birth/Sex: 03-31-1946 (70 y.o. Female) Treating RN: Montey Hora Primary Care Physician: Deborra Medina Other Clinician: Referring Physician: Deborra Medina Treating Physician/Extender: Frann Rider in Treatment: 5 Encounter Discharge Information Items Discharge  Pain Level: 0 Discharge Condition: Stable Ambulatory Status: Ambulatory Discharge Destination: Home Transportation: Private Auto Accompanied By: self Schedule Follow-up Appointment: Yes Medication Reconciliation completed and provided to Patient/Care No Meagan Zavala: Provided on Clinical Summary of Care: 05/21/2016 Form Type Recipient Paper Patient [facility] Electronic Signature(s) Signed: 05/21/2016 4:59:26 PM By: Montey Hora Previous Signature: 05/21/2016 3:22:02 PM Version By: Ruthine Dose Entered By: Montey Hora on 05/21/2016 15:24:06 Meagan Austin I. ([id number]) -------------------------------------------------------------------------------- Multi Wound Chart Details Patient Name: Meagan Austin I. Date of Service: 05/21/2016 3:00 PM Medical Record Number: [id number] Patient Account Number: [id number] Date of Birth/Sex: Dec 03, 1945 (70 y.o. Female) Treating RN: Baruch Gouty, RN, BSN, Velva Harman Primary Care Physician: Deborra Medina Other Clinician: Referring Physician: Deborra Medina Treating Physician/Extender: Frann Rider in Treatment: 5 Vital Signs Height(in): 61 Pulse(bpm): 52 Weight(lbs): 125 Blood Pressure 106/77 (mmHg): Body Mass Index(BMI): 24 Temperature(F): 98.0 Respiratory Rate 18 (breaths/min): Photos: [1:No Photos] [N/A:N/A] Wound Location: [1:Right, Medial Abdomen - midline] [N/A:N/A] Wounding Event: [1:Gradually Appeared] [N/A:N/A] Primary Etiology: [1:Abscess] [N/A:N/A] Date Acquired: [1:02/27/2016] [N/A:N/A] Weeks of Treatment: [1:5] [N/A:N/A] Wound Status: [1:Open] [N/A:N/A] Measurements L x W x D 0.5x1.5x0.1 [N/A:N/A] (cm) Area (cm) : [1:0.589] [N/A:N/A] Volume (cm) : [1:0.059] [N/A:N/A] % Reduction in Area: [1:40.00%] [N/A:N/A] % Reduction in Volume: 80.00% [N/A:N/A] Classification: [1:Partial Thickness] [N/A:N/A] Periwound Skin Texture: No Abnormalities Noted [N/A:N/A] Periwound Skin [1:No Abnormalities Noted] [N/A:N/A] Moisture: Periwound  Skin Color: No Abnormalities Noted [N/A:N/A] Tenderness on [1:No] [N/A:N/A] Treatment Notes Electronic Signature(s) Signed: 05/21/2016 5:25:53 PM By: Regan Lemming BSN, RN Entered By: Regan Lemming on 05/21/2016 15:12:24 Meagan Zavala ([id number]) Meagan Zavala ([id number]) -------------------------------------------------------------------------------- Zap Details Patient Name: Meagan Austin I. Date of Service: 05/21/2016 3:00 PM Medical Record Number: [id number] Patient Account Number: [id number] Date of Birth/Sex: 1946-07-15 (70 y.o. Female) Treating RN: Baruch Gouty, RN, BSN, Velva Harman Primary Care Physician: Deborra Medina Other Clinician: Referring Physician: Derrel Nip  Helene Kelp Treating Physician/Extender: Frann Rider in Treatment: 5 Active Inactive Malignancy/Atypical Etiology Nursing Diagnoses: Knowledge deficit related to disease process and management of atypical ulcer etiology Goals: Patient/caregiver will verbalize understanding of disease process and disease management of atypical ulcer etiology Date Initiated: 04/16/2016 Goal Status: Active Interventions: Assess patient and family medical history for signs and symptoms of malignancy/atypical etiology upon admission Provide education on atypical ulcer etiologies Provide education on malignant ulcerations Treatment Activities: Biopsy for Pathology : 04/16/2016 Test ordered outside of clinic : 04/16/2016 Notes: Nutrition Nursing Diagnoses: Potential for alteratiion in Nutrition/Potential for imbalanced nutrition Goals: Patient/caregiver agrees to and verbalizes understanding of need to use nutritional supplements and/or vitamins as prescribed Date Initiated: 04/16/2016 Goal Status: Active Interventions: Assess patient nutrition upon admission and as needed per policy Provide education on nutrition Meagan Zavala, [name] ([id number]) Notes: Orientation to the Wound Care Program Nursing  Diagnoses: Knowledge deficit related to the wound healing center program Goals: Patient/caregiver will verbalize understanding of the [place] Program Date Initiated: 04/16/2016 Goal Status: Active Interventions: Provide education on orientation to the wound center Notes: Wound/Skin Impairment Nursing Diagnoses: Impaired tissue integrity Goals: Patient/caregiver will verbalize understanding of skin care regimen Date Initiated: 04/16/2016 Goal Status: Active Ulcer/skin breakdown will heal within 14 weeks Date Initiated: 04/16/2016 Goal Status: Active Interventions: Assess patient/caregiver ability to obtain necessary supplies Assess patient/caregiver ability to perform ulcer/skin care regimen upon admission and as needed Assess ulceration(s) every visit Treatment Activities: Skin care regimen initiated : 04/16/2016 Topical wound management initiated : 04/16/2016 Notes: Electronic Signature(s) Signed: 05/21/2016 5:25:53 PM By: Regan Lemming BSN, RN Entered By: Regan Lemming on 05/21/2016 15:12:12 Meagan Austin I. ([id number]) -------------------------------------------------------------------------------- Pain Assessment Details Patient Name: Meagan Austin I. Date of Service: 05/21/2016 3:00 PM Medical Record Number: [id number] Patient Account Number: [id number] Date of Birth/Sex: August 16, 1946 (70 y.o. Female) Treating RN: Montey Hora Primary Care Physician: Deborra Medina Other Clinician: Referring Physician: Deborra Medina Treating Physician/Extender: Frann Rider in Treatment: 5 Active Problems Location of Pain Severity and Description of Pain Patient Has Paino No Site Locations Pain Management and Medication Current Pain Management: Electronic Signature(s) Signed: 05/21/2016 4:59:26 PM By: Montey Hora Entered By: Montey Hora on 05/21/2016 15:00:54 Meagan Zavala  ([id number]) -------------------------------------------------------------------------------- Patient/Caregiver Education Details Patient Name: Meagan Austin I. Date of Service: 05/21/2016 3:00 PM Medical Record Number: [id number] Patient Account Number: [id number] Date of Birth/Gender: December 26, 1945 (70 y.o. Female) Treating RN: Montey Hora Primary Care Physician: Deborra Medina Other Clinician: Referring Physician: Deborra Medina Treating Physician/Extender: Frann Rider in Treatment: 5 Education Assessment Education Provided To: Patient Education Topics Provided Wound/Skin Impairment: Handouts: Other: wound care as ordered Methods: Demonstration, Explain/Verbal Responses: State content correctly Electronic Signature(s) Signed: 05/21/2016 4:59:26 PM By: Montey Hora Entered By: Montey Hora on 05/21/2016 15:24:21 Meagan Zavala ([id number]) -------------------------------------------------------------------------------- Wound Assessment Details Patient Name: Meagan Austin I. Date of Service: 05/21/2016 3:00 PM Medical Record Number: [id number] Patient Account Number: [id number] Date of Birth/Sex: [birth year] (70 y.o. Female) Treating RN: Montey Hora Primary Care Physician: Deborra Medina Other Clinician: Referring Physician: Deborra Medina Treating Physician/Extender: Frann Rider in Treatment: 5 Wound Status Wound Number: 1 Primary Etiology: Abscess Wound Location: Right, Medial Abdomen - Wound Status: Open midline Wounding Event: Gradually Appeared Date Acquired: 02/27/2016 Weeks Of Treatment: 5 Clustered Wound: No Wound Measurements Length: (cm) 0.5 Width: (cm) 1.5 Depth: (cm) 0.1 Area: (cm) 0.589 Volume: (cm) 0.059 % Reduction in Area: 40% % Reduction in Volume: 80% Wound Description Classification: Partial Thickness Periwound Skin Texture  Texture Color No Abnormalities Noted: No No Abnormalities Noted: No Moisture No Abnormalities  Noted: No Treatment Notes Wound #1 (Right, Medial Abdomen - midline) 1. Cleansed with: Clean wound with Normal Saline 2. Anesthetic Topical Lidocaine 4% cream to wound bed prior to debridement 3. Peri-wound Care: Skin Prep 4. Dressing Applied: Prisma Ag 5. Secondary Dressing Applied Bordered Foam Dressing Dry Gauze Meagan Zavala, [name] ([id number]) Electronic Signature(s) Signed: 05/21/2016 4:59:26 PM By: Montey Hora Entered By: Montey Hora on 05/21/2016 15:05:28 Meagan Zavala ([id number]) -------------------------------------------------------------------------------- Vitals Details Patient Name: Meagan Austin I. Date of Service: 05/21/2016 3:00 PM Medical Record Number: [id number] Patient Account Number: [id number] Date of Birth/Sex: August 22, 1946 (70 y.o. Female) Treating RN: Montey Hora Primary Care Physician: Deborra Medina Other Clinician: Referring Physician: Deborra Medina Treating Physician/Extender: Frann Rider in Treatment: 5 Vital Signs Time Taken: 15:01 Temperature (F): 98.0 Height (in): 61 Pulse (bpm): 52 Weight (lbs): 125 Respiratory Rate (breaths/min): 18 Body Mass Index (BMI): 23.6 Blood Pressure (mmHg): 106/77 Reference Range: 80 - 120 mg / dl Electronic Signature(s) Signed: 05/21/2016 4:59:26 PM By: Montey Hora Entered By: Montey Hora on 05/21/2016 15:01:38

## 2016-05-22 NOTE — Progress Notes (Signed)
Meagan Zavala, Meagan Zavala ([id number]) Visit Report for 05/21/2016 Chief Complaint Document Details Patient Name: Meagan Zavala I. Date of Service: 05/21/2016 3:00 PM Medical Record Number: [id number] Patient Account Number: [id number] Date of Birth/Sex: Dec 19, 1945 (70 y.o. Female) Treating RN: Montey Hora Primary Care Physician: Deborra Medina Other Clinician: Referring Physician: Deborra Medina Treating Physician/Extender: Frann Rider in Treatment: 5 Information Obtained from: Patient Chief Complaint Patient seen for complaints of Non-Healing Wound to the right of from licorice on her abdominal wall for about a month Electronic Signature(s) Signed: 05/21/2016 3:14:08 PM By: Christin Fudge MD, FACS Entered By: Christin Fudge on 05/21/2016 15:14:08 [name] -------------------------------------------------------------------------------- HPI Details Patient Name: Meagan Austin I. Date of Service: 05/21/2016 3:00 PM Medical Record Number: [id number] Patient Account Number: [id number] Date of Birth/Sex: 07-06-1946 (70 y.o. Female) Treating RN: Montey Hora Primary Care Physician: Deborra Medina Other Clinician: Referring Physician: Deborra Medina Treating Physician/Extender: Frann Rider in Treatment: 5 History of Present Illness Location: umbilical region of the abdominal wall Quality: an open ulcerated area some amount of pain and burning Severity: Patient states wound are getting worse. Duration: Patient has had the wound for < 4 weeks prior to presenting for treatment Timing: Pain in wound is Intermittent (comes and goes Context: The wound appeared gradually over time Modifying Factors: Other treatment(s) tried include:and doxycycline the present time Associated Signs and Symptoms: Patient reports having increase discharge. HPI Description: 70 year old patient who was recently seen by Dr. Tommi Rumps was noted to have a small ulceration on abdomen  wall. the patient has had it for about 6 weeks in the periumbilical areaThe patient noted that it has become slightly larger and draining purulent material.was started on doxycycline and given an appointment to the wound center. She does not recall if she had any insect bite or injury her past medical history significant for GERD, hypothyroidism, osteoporosis, chronic kidney disease stage III, chronic low back pain, anemia, vitamin D deficiency and urge incontinence. 7/27//2017 -- pathology report of specimen done -- DIAGNOSIS: A. SKIN, ABDOMEN; BIOPSY: - CHRONIC ULCER. - NEGATIVE FOR MALIGNANCY Electronic Signature(s) Signed: 05/21/2016 3:14:13 PM By: Christin Fudge MD, FACS Entered By: Christin Fudge on 05/21/2016 15:14:12 [name] -------------------------------------------------------------------------------- Physical Exam Details Patient Name: Meagan Austin I. Date of Service: 05/21/2016 3:00 PM Medical Record Number: [id number] Patient Account Number: [id number] Date of Birth/Sex: [birth year] (70 y.o. Female) Treating RN: Montey Hora Primary Care Physician: Deborra Medina Other Clinician: Referring Physician: Deborra Medina Treating Physician/Extender: Frann Rider in Treatment: 5 Constitutional . Pulse regular. Respirations normal and unlabored. Afebrile. . Eyes Nonicteric. Reactive to light. Ears, Nose, Mouth, and Throat Lips, teeth, and gums WNL.Marland Kitchen Moist mucosa without lesions. Neck supple and nontender. No palpable supraclavicular or cervical adenopathy. Normal sized without goiter. Respiratory WNL. No retractions.. Breath sounds WNL, No rubs, rales, rhonchi, or wheeze.. Cardiovascular Heart rhythm and rate regular, no murmur or gallop.. Pedal Pulses WNL. No clubbing, cyanosis or edema. Chest Breasts symmetical and no nipple discharge.. Breast tissue WNL, no masses, lumps, or tenderness.. Lymphatic No adneopathy. No adenopathy. No  adenopathy. Musculoskeletal Adexa without tenderness or enlargement.. Digits and nails w/o clubbing, cyanosis, infection, petechiae, ischemia, or inflammatory conditions.. Integumentary (Hair, Skin) No suspicious lesions. No crepitus or fluctuance. No peri-wound warmth or erythema. No masses.Marland Kitchen Psychiatric Judgement and insight Intact.. No evidence of depression, anxiety, or agitation.. Notes the wound is looking fairly clean with no debris to be sharply removed and the edges are coming in nicely. Electronic Signature(s) Signed:  05/21/2016 3:14:38 PM By: Christin Fudge MD, FACS Entered By: Christin Fudge on 05/21/2016 15:14:37 [name] ([id number]) -------------------------------------------------------------------------------- Physician Orders Details Patient Name: Meagan Austin I. Date of Service: 05/21/2016 3:00 PM Medical Record Patient Account Number: [id number] Number: Afful, RN, BSN, Treating RN: 09-12-1946 [phone] y.o. Velva Harman Date of Birth/Sex: Female) Other Clinician: Primary Care Physician: Deborra Medina Treating Christin Fudge Referring Physician: Deborra Medina Physician/Extender: Suella Grove in Treatment: 5 Verbal / Phone Orders: Yes Clinician: Afful, RN, BSN, Rita Read Back and Verified: Yes Diagnosis Coding Wound Cleansing Wound #1 Right,Medial Abdomen - midline o Clean wound with Normal Saline. Anesthetic Wound #1 Right,Medial Abdomen - midline o Topical Lidocaine 4% cream applied to wound bed prior to debridement o Injected 2% Lidocaine without epinephrine prior to debridement Primary Wound Dressing Wound #1 Right,Medial Abdomen - midline o Prisma Ag Secondary Dressing Wound #1 Right,Medial Abdomen - midline o Dry Gauze o Boardered Foam Dressing Dressing Change Frequency Wound #1 Right,Medial Abdomen - midline o Change dressing every other day. Follow-up Appointments Wound #1 Right,Medial Abdomen - midline o Return Appointment in 1  week. Additional Orders / Instructions Wound #1 Right,Medial Abdomen - midline o Increase protein intake. Electronic Signature(s) Signed: 05/21/2016 4:46:57 PM By: Christin Fudge MD, FACS [name] ([id number]) Signed: 05/21/2016 5:25:53 PM By: Regan Lemming BSN, RN Entered By: Regan Lemming on 05/21/2016 15:12:48 [name] ([id number]) -------------------------------------------------------------------------------- Problem List Details Patient Name: Meagan Austin I. Date of Service: 05/21/2016 3:00 PM Medical Record Number: [id number] Patient Account Number: [id number] Date of Birth/Sex: [birth year] (70 y.o. 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 ([id number]) -------------------------------------------------------------------------------- Progress Note Details Patient Name: Meagan Austin I. Date of Service: 05/21/2016 3:00 PM Medical Record Number: [id number] Patient Account Number: [id number] Date of Birth/Sex: 1945/12/04 (70 y.o. Female) Treating RN: Montey Hora Primary Care Physician: Deborra Medina Other Clinician: Referring Physician: Deborra Medina Treating Physician/Extender: Frann Rider in Treatment: 5 Subjective Chief Complaint Information  obtained from Patient Patient seen for complaints of Non-Healing Wound to the right of from licorice on her abdominal wall for about a month History of Present Illness (HPI) The following HPI elements were documented for the patient's wound: Location: umbilical region of the abdominal wall Quality: an open ulcerated area some amount of pain and burning Severity: Patient states wound are getting worse. Duration: Patient has had the wound for < 4 weeks prior to presenting for treatment Timing: Pain in wound is Intermittent (comes and goes Context: The wound appeared gradually over time Modifying Factors: Other treatment(s) tried include:and doxycycline the present time Associated Signs and Symptoms: Patient reports having increase discharge. 70 year old patient who was recently seen by Dr. Tommi Rumps was noted to have a small ulceration on abdomen wall. the patient has had it for about 6 weeks in the periumbilical areaThe patient noted that it has become slightly larger and draining purulent material.was started on doxycycline and given an appointment to the wound center. She does not recall if she  had any insect bite or injury her past medical history significant for GERD, hypothyroidism, osteoporosis, chronic kidney disease stage III, chronic low back pain, anemia, vitamin D deficiency and urge incontinence. 7/27//2017 -- pathology report of specimen done -- DIAGNOSIS: A. SKIN, ABDOMEN; BIOPSY: - CHRONIC ULCER. - NEGATIVE FOR MALIGNANCY Objective Constitutional Pulse regular. Respirations normal and unlabored. Afebrile. Vitals Time Taken: 3:01 PM, Height: 61 in, Weight: 125 lbs, BMI: 23.6, Temperature: 98.0 F, Pulse: 52 bpm, Respiratory Rate: 18 breaths/min, Blood Pressure: 106/77 mmHg. JAMILLAH, LANDEROS I. ([id number]) Eyes Nonicteric. Reactive to light. Ears, Nose, Mouth, and Throat Lips, teeth, and gums WNL.Marland Kitchen Moist mucosa without lesions. Neck supple and nontender. No palpable  supraclavicular or cervical adenopathy. Normal sized without goiter. Respiratory WNL. No retractions.. Breath sounds WNL, No rubs, rales, rhonchi, or wheeze.. Cardiovascular Heart rhythm and rate regular, no murmur or gallop.. Pedal Pulses WNL. No clubbing, cyanosis or edema. Chest Breasts symmetical and no nipple discharge.. Breast tissue WNL, no masses, lumps, or tenderness.. Lymphatic No adneopathy. No adenopathy. No adenopathy. Musculoskeletal Adexa without tenderness or enlargement.. Digits and nails w/o clubbing, cyanosis, infection, petechiae, ischemia, or inflammatory conditions.Marland Kitchen Psychiatric Judgement and insight Intact.. No evidence of depression, anxiety, or agitation.. General Notes: the wound is looking fairly clean with no debris to be sharply removed and the edges are coming in nicely. Integumentary (Hair, Skin) No suspicious lesions. No crepitus or fluctuance. No peri-wound warmth or erythema. No masses.. Wound #1 status is Open. Original cause of wound was Gradually Appeared. The wound is located on the Right,Medial Abdomen - midline. The wound measures 0.5cm length x 1.5cm width x 0.1cm depth; 0.589cm^2 area and 0.059cm^3 volume. Assessment Active Problems ICD-10 S31.105A - Unspecified open wound of abdominal wall, periumbilic region without penetration into Ferger, Safire I. ([id number]) peritoneal cavity, initial encounter L02.221 - Furuncle of abdominal wall Plan Wound Cleansing: Wound #1 Right,Medial Abdomen - midline: Clean wound with Normal Saline. Anesthetic: Wound #1 Right,Medial Abdomen - midline: Topical Lidocaine 4% cream applied to wound bed prior to debridement Injected 2% Lidocaine without epinephrine prior to debridement Primary Wound Dressing: Wound #1 Right,Medial Abdomen - midline: Prisma Ag Secondary Dressing: Wound #1 Right,Medial Abdomen - midline: Dry Gauze Boardered Foam Dressing Dressing Change Frequency: Wound #1 Right,Medial  Abdomen - midline: Change dressing every other day. Follow-up Appointments: Wound #1 Right,Medial Abdomen - midline: Return Appointment in 1 week. Additional Orders / Instructions: Wound #1 Right,Medial Abdomen - midline: Increase protein intake. We will use Prisma Ag applications on alternate days after washing this well with soap and water and an appropriate dressing has been applied. She will come back and see as next week. Electronic Signature(s) Signed: 05/21/2016 3:15:13 PM By: Christin Fudge MD, FACS Meagan Austin I. ([id number]) Entered By: Christin Fudge on 05/21/2016 15:15:12 [name] ([id number]) -------------------------------------------------------------------------------- SuperBill Details Patient Name: Meagan Austin I. Date of Service: 05/21/2016 Medical Record Number: [id number] Patient Account Number: [id number] Date of Birth/Sex: May 07, 1946 (70 y.o. 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

## 2016-05-25 ENCOUNTER — Telehealth: Payer: Self-pay | Admitting: Internal Medicine

## 2016-05-25 ENCOUNTER — Other Ambulatory Visit: Payer: Self-pay | Admitting: Internal Medicine

## 2016-05-25 NOTE — Telephone Encounter (Signed)
Meagan Zavala called saying she brought in a specimen of her bowels about a month ago and was just recently told she needs to make an appointment with an Oncologist. She has an upcoming appointment on 9.14.17 with the PA at a Gastrology office. She's asking if that office can tell her the same thing an Oncologist would tell her. She's wondering why she needs to see two different Providers for the same problem. She'd like a phone call regarding this.  Pt's ph# [phone] Thank you.

## 2016-05-25 NOTE — Telephone Encounter (Signed)
You are correct

## 2016-05-25 NOTE — Telephone Encounter (Signed)
Patient has active referrals for both GI and Oncology, I am correct that GI was for positive fecal test and oncology was for anemia, just want clarity prior to calling patient, thanks

## 2016-05-26 ENCOUNTER — Telehealth (HOSPITAL_COMMUNITY): Payer: Self-pay

## 2016-05-26 NOTE — Telephone Encounter (Signed)
Not sure what to say,  I don't make the policies .  GI referrals always starts with an H & P with  NP , then the colonscopy is done by the MD

## 2016-05-26 NOTE — Telephone Encounter (Signed)
Pt called returned your call. Thank you!

## 2016-05-26 NOTE — Telephone Encounter (Signed)
Meagan Zavala ([facility]) l/m to call back and schedule appt

## 2016-05-26 NOTE — Telephone Encounter (Signed)
Attempted to reach patient, phone disconnected while calling, please transfer call to me thanks

## 2016-05-26 NOTE — Telephone Encounter (Signed)
Spoke with the patient, she is scheduled to see an oncologist in hopefully a week, they are trying to work her in, and then the GI doctor is suppose to see her in mid sept.  Patient is not happy because that appt is with a NP not a doctor.  She knows she has bleeding ulcers but feels like everyone keeps telling her to see specialists but know one can see her now.  Please advise, thanks

## 2016-05-26 NOTE — Telephone Encounter (Signed)
Left a VM to return my call, thanks 

## 2016-05-26 NOTE — Telephone Encounter (Signed)
Pt requested a returned call: [phone]

## 2016-05-27 NOTE — Telephone Encounter (Signed)
Spoke with the patient, she needed a phone number to call the oncologist to follow up.  Completed will follow as needed.

## 2016-05-27 NOTE — Telephone Encounter (Signed)
Pt called back to follow up with what Dr Derrel Nip says. Mornings are good to reach pt.  Thank you!  Call pt @ [phone].

## 2016-05-28 ENCOUNTER — Encounter: Payer: PPO | Admitting: Surgery

## 2016-05-28 DIAGNOSIS — S31105A Unspecified open wound of abdominal wall, periumbilic region without penetration into peritoneal cavity, initial encounter: Secondary | ICD-10-CM | POA: Diagnosis not present

## 2016-05-29 NOTE — Progress Notes (Signed)
RASHADA, GERNER ([id number]) Visit Report for 05/28/2016 Chief Complaint Document Details Patient Name: Meagan Zavala, Meagan Zavala Zavala. Date of Service: 05/28/2016 3:00 PM Medical Record Number: [id number] Patient Account Number: [id number] Date of Birth/Sex: 1946/01/13 (70 y.o. Female) Treating RN: Montey Hora Primary Care Physician: Deborra Medina Other Clinician: Referring Physician: Deborra Medina Treating Physician/Extender: Frann Rider in Treatment: 6 Information Obtained from: Patient Chief Complaint Patient seen for complaints of Non-Healing Wound to the right of from licorice on her abdominal wall for about a month Electronic Signature(s) Signed: 05/28/2016 3:41:50 PM By: Christin Fudge MD, FACS Entered By: Christin Fudge on 05/28/2016 15:41:50 Meagan Austin Zavala. ([id number]) -------------------------------------------------------------------------------- HPI Details Patient Name: Meagan Austin Zavala. Date of Service: 05/28/2016 3:00 PM Medical Record Number: [id number] Patient Account Number: [id number] Date of Birth/Sex: April 20, 1946 (70 y.o. Female) Treating RN: Montey Hora Primary Care Physician: Deborra Medina Other Clinician: Referring Physician: Deborra Medina Treating Physician/Extender: Frann Rider in Treatment: 6 History of Present Illness Location: umbilical region of the abdominal wall Quality: an open ulcerated area some amount of pain and burning Severity: Patient states wound are getting worse. Duration: Patient has had the wound for < 4 weeks prior to presenting for treatment Timing: Pain in wound is Intermittent (comes and goes Context: The wound appeared gradually over time Modifying Factors: Other treatment(s) tried include:and doxycycline the present time Associated Signs and Symptoms: Patient reports having increase discharge. HPI Description: 70 year old patient who was recently seen by Dr. Tommi Rumps was noted to have a small ulceration on abdomen  wall. the patient has had it for about 6 weeks in the periumbilical areaThe patient noted that it has become slightly larger and draining purulent material.was started on doxycycline and given an appointment to the wound center. She does not recall if she had any insect bite or injury her past medical history significant for GERD, hypothyroidism, osteoporosis, chronic kidney disease stage III, chronic low back pain, anemia, vitamin D deficiency and urge incontinence. 7/27//2017 -- pathology report of specimen done -- DIAGNOSIS: A. SKIN, ABDOMEN; BIOPSY: - CHRONIC ULCER. - NEGATIVE FOR MALIGNANCY Electronic Signature(s) Signed: 05/28/2016 3:41:56 PM By: Christin Fudge MD, FACS Entered By: Christin Fudge on 05/28/2016 15:41:56 Meagan Zavala ([id number]) -------------------------------------------------------------------------------- Physical Exam Details Patient Name: Meagan Austin Zavala. Date of Service: 05/28/2016 3:00 PM Medical Record Number: [id number] Patient Account Number: [id number] Date of Birth/Sex: 1946-03-29 (70 y.o. Female) Treating RN: Montey Hora Primary Care Physician: Deborra Medina Other Clinician: Referring Physician: Deborra Medina Treating Physician/Extender: Frann Rider in Treatment: 6 Constitutional . Pulse regular. Respirations normal and unlabored. Afebrile. . Eyes Nonicteric. Reactive to light. Ears, Nose, Mouth, and Throat Lips, teeth, and gums WNL.Marland Kitchen Moist mucosa without lesions. Neck supple and nontender. No palpable supraclavicular or cervical adenopathy. Normal sized without goiter. Respiratory WNL. No retractions.. Breath sounds WNL, No rubs, rales, rhonchi, or wheeze.. Cardiovascular Heart rhythm and rate regular, no murmur or gallop.. Pedal Pulses WNL. No clubbing, cyanosis or edema. Chest Breasts symmetical and no nipple discharge.. Breast tissue WNL, no masses, lumps, or tenderness.. Lymphatic No adneopathy. No adenopathy. No  adenopathy. Musculoskeletal Adexa without tenderness or enlargement.. Digits and nails w/o clubbing, cyanosis, infection, petechiae, ischemia, or inflammatory conditions.. Integumentary (Hair, Skin) No suspicious lesions. No crepitus or fluctuance. No peri-wound warmth or erythema. No masses.Marland Kitchen Psychiatric Judgement and insight Intact.. No evidence of depression, anxiety, or agitation.. Notes the wound looks excellent and is shallow and has some epithelialization from the sides. No sharp debridement was required today. Electronic Signature(s) Signed:  05/28/2016 3:43:05 PM By: Christin Fudge MD, FACS Entered By: Christin Fudge on 05/28/2016 15:43:05 Meagan Zavala ([id number]) -------------------------------------------------------------------------------- Physician Orders Details Patient Name: Meagan Austin Zavala. Date of Service: 05/28/2016 3:00 PM Medical Record Patient Account Number: [id number] Number: Afful, RN, BSN, Treating RN: 1945/12/26 [phone] y.o. Velva Harman Date of Birth/Sex: Female) Other Clinician: Primary Care Physician: Deborra Medina Treating Christin Fudge Referring Physician: Deborra Medina Physician/Extender: Suella Grove in Treatment: 6 Verbal / Phone Orders: Yes Clinician: Afful, RN, BSN, Rita Read Back and Verified: Yes Diagnosis Coding Wound Cleansing Wound #1 Right,Medial Abdomen - midline o Clean wound with Normal Saline. Anesthetic Wound #1 Right,Medial Abdomen - midline o Topical Lidocaine 4% cream applied to wound bed prior to debridement o Injected 2% Lidocaine without epinephrine prior to debridement Primary Wound Dressing Wound #1 Right,Medial Abdomen - midline o Prisma Ag Secondary Dressing Wound #1 Right,Medial Abdomen - midline o Dry Gauze o Boardered Foam Dressing Dressing Change Frequency Wound #1 Right,Medial Abdomen - midline o Change dressing every day. Follow-up Appointments Wound #1 Right,Medial Abdomen - midline o Return  Appointment in 1 week. Additional Orders / Instructions Wound #1 Right,Medial Abdomen - midline o Increase protein intake. o Activity as tolerated o Other: - Vitamin C, A, MVI, ZINC ENGIE, PERLSTEIN ([id number]) Electronic Signature(s) Signed: 05/28/2016 4:38:34 PM By: Christin Fudge MD, FACS Signed: 05/28/2016 5:32:33 PM By: Regan Lemming BSN, RN Entered By: Regan Lemming on 05/28/2016 15:37:43 Meagan Zavala ([id number]) -------------------------------------------------------------------------------- Problem List Details Patient Name: Meagan Austin Zavala. Date of Service: 05/28/2016 3:00 PM Medical Record Number: [id number] Patient Account Number: [id number] Date of Birth/Sex: May 30, 1946 (70 y.o. 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 ([id number]) -------------------------------------------------------------------------------- Progress Note Details Patient Name: Meagan Austin Zavala. Date of Service: 05/28/2016 3:00 PM Medical Record Number: [id number] Patient Account Number: [id number] Date of Birth/Sex: 09/13/1946 (70 y.o. Female) Treating RN: Montey Hora Primary Care Physician: Deborra Medina Other Clinician: Referring Physician: Deborra Medina Treating Physician/Extender:  Frann Rider in Treatment: 6 Subjective Chief Complaint Information obtained from Patient Patient seen for complaints of Non-Healing Wound to the right of from licorice on her abdominal wall for about a month History of Present Illness (HPI) The following HPI elements were documented for the patient's wound: Location: umbilical region of the abdominal wall Quality: an open ulcerated area some amount of pain and burning Severity: Patient states wound are getting worse. Duration: Patient has had the wound for < 4 weeks prior to presenting for treatment Timing: Pain in wound is Intermittent (comes and goes Context: The wound appeared gradually over time Modifying Factors: Other treatment(s) tried include:and doxycycline the present time Associated Signs and Symptoms: Patient reports having increase discharge. 70 year old patient who was recently seen by Dr. Tommi Rumps was noted to have a small ulceration on abdomen wall. the patient has had it for about 6 weeks in the periumbilical areaThe patient noted that it has become slightly larger and draining purulent material.was started on doxycycline and given an  appointment to the wound center. She does not recall if she had any insect bite or injury her past medical history significant for GERD, hypothyroidism, osteoporosis, chronic kidney disease stage III, chronic low back pain, anemia, vitamin D deficiency and urge incontinence. 7/27//2017 -- pathology report of specimen done -- DIAGNOSIS: A. SKIN, ABDOMEN; BIOPSY: - CHRONIC ULCER. - NEGATIVE FOR MALIGNANCY Objective Constitutional Pulse regular. Respirations normal and unlabored. Afebrile. Vitals Time Taken: 3:20 PM, Height: 61 in, Weight: 125 lbs, BMI: 23.6, Temperature: 98.1 F, Pulse: 78 bpm, Respiratory Rate: 18 breaths/min, Blood Pressure: 98/82 mmHg. Meagan Zavala, Meagan Zavala. ([id number]) Eyes Nonicteric. Reactive to light. Ears, Nose, Mouth, and Throat Lips, teeth, and gums  WNL.Marland Kitchen Moist mucosa without lesions. Neck supple and nontender. No palpable supraclavicular or cervical adenopathy. Normal sized without goiter. Respiratory WNL. No retractions.. Breath sounds WNL, No rubs, rales, rhonchi, or wheeze.. Cardiovascular Heart rhythm and rate regular, no murmur or gallop.. Pedal Pulses WNL. No clubbing, cyanosis or edema. Chest Breasts symmetical and no nipple discharge.. Breast tissue WNL, no masses, lumps, or tenderness.. Lymphatic No adneopathy. No adenopathy. No adenopathy. Musculoskeletal Adexa without tenderness or enlargement.. Digits and nails w/o clubbing, cyanosis, infection, petechiae, ischemia, or inflammatory conditions.Marland Kitchen Psychiatric Judgement and insight Intact.. No evidence of depression, anxiety, or agitation.. General Notes: the wound looks excellent and is shallow and has some epithelialization from the sides. No sharp debridement was required today. Integumentary (Hair, Skin) No suspicious lesions. No crepitus or fluctuance. No peri-wound warmth or erythema. No masses.. Wound #1 status is Open. Original cause of wound was Gradually Appeared. The wound is located on the Right,Medial Abdomen - midline. The wound measures 0.4cm length x 1.3cm width x 0.1cm depth; 0.408cm^2 area and 0.041cm^3 volume. The wound is limited to skin breakdown. There is no tunneling or undermining noted. There is a large amount of serous drainage noted. The wound margin is flat and intact. There is medium (34-66%) pink granulation within the wound bed. There is a medium (34-66%) amount of necrotic tissue within the wound bed including Adherent Slough. The periwound skin appearance exhibited: Moist. The periwound skin appearance did not exhibit: Callus, Crepitus, Excoriation, Fluctuance, Friable, Induration, Localized Edema, Rash, Scarring, Dry/Scaly, Maceration, Atrophie Blanche, Cyanosis, Ecchymosis, Hemosiderin Staining, Mottled, Pallor, Rubor, Erythema. Periwound  temperature was noted as No Abnormality. Meagan Zavala, Meagan Zavala. ([id number]) Assessment Active Problems ICD-10 S31.105A - Unspecified open wound of abdominal wall, periumbilic region without penetration into peritoneal cavity, initial encounter L02.221 - Furuncle of abdominal wall Plan Wound Cleansing: Wound #1 Right,Medial Abdomen - midline: Clean wound with Normal Saline. Anesthetic: Wound #1 Right,Medial Abdomen - midline: Topical Lidocaine 4% cream applied to wound bed prior to debridement Injected 2% Lidocaine without epinephrine prior to debridement Primary Wound Dressing: Wound #1 Right,Medial Abdomen - midline: Prisma Ag Secondary Dressing: Wound #1 Right,Medial Abdomen - midline: Dry Gauze Boardered Foam Dressing Dressing Change Frequency: Wound #1 Right,Medial Abdomen - midline: Change dressing every day. Follow-up Appointments: Wound #1 Right,Medial Abdomen - midline: Return Appointment in 1 week. Additional Orders / Instructions: Wound #1 Right,Medial Abdomen - midline: Increase protein intake. Activity as tolerated Other: - Vitamin C, A, MVI, ZINC Pujol, Meagan Zavala. ([id number]) The wound is looking very clean and Zavala have recommended we continue with Prisma AG on alternate days and offload this area as much as possible. Zavala have asked her to spread her visits to every 2 week if she so desires. Electronic Signature(s) Signed: 05/28/2016 3:43:53 PM By: Christin Fudge MD, FACS Entered By: Christin Fudge  on 05/28/2016 15:43:53 Meagan Zavala, Meagan IMarland Kitchen ([id number]) -------------------------------------------------------------------------------- SuperBill Details Patient Name: Meagan Zavala, Meagan Zavala. Date of Service: 05/28/2016 Medical Record Number: [id number] Patient Account Number: [id number] Date of Birth/Sex: [birth year] (70 y.o. 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

## 2016-05-29 NOTE — Progress Notes (Signed)
ARYAM, BARROS ([id number]) Visit Report for 05/28/2016 Arrival Information Details Patient Name: CODEY, MANKOWSKI I. Date of Service: 05/28/2016 3:00 PM Medical Record Number: [id number] Patient Account Number: [id number] Date of Birth/Sex: 05/08/1946 (70 y.o. Female) Treating RN: Montey Hora Primary Care Physician: Deborra Medina Other Clinician: Referring Physician: Deborra Medina Treating Physician/Extender: Frann Rider in Treatment: 6 Visit Information History Since Last Visit Added or deleted any medications: No Patient Arrived: Ambulatory Any new allergies or adverse reactions: No Arrival Time: 15:18 Had a fall or experienced change in No Accompanied By: self activities of daily living that may affect Transfer Assistance: None risk of falls: Patient Identification Verified: Yes Signs or symptoms of abuse/neglect since last No Secondary Verification Process Yes visito Completed: Hospitalized since last visit: No Patient Requires Transmission-Based No Pain Present Now: No Precautions: Patient Has Alerts: No Electronic Signature(s) Signed: 05/28/2016 4:52:33 PM By: Montey Hora Entered By: Montey Hora on 05/28/2016 15:18:55 [name] -------------------------------------------------------------------------------- Clinic Level of Care Assessment Details Patient Name: [name] I. Date of Service: 05/28/2016 3:00 PM Medical Record Number: [id number] Patient Account Number: [id number] Date of Birth/Sex: Apr 06, 1946 (70 y.o. Female) Treating RN: Afful, RN, BSN, Baggs Primary Care Physician: Deborra Medina Other Clinician: Referring Physician: Deborra Medina Treating Physician/Extender: Frann Rider in Treatment: 6 Clinic Level of Care Assessment Items TOOL 4 Quantity Score []  - Use when only an EandM is performed on FOLLOW-UP visit 0 ASSESSMENTS - Nursing Assessment / Reassessment X - Reassessment of Co-morbidities (includes  updates in patient status) 1 10 X - Reassessment of Adherence to Treatment Plan 1 5 ASSESSMENTS - Wound and Skin Assessment / Reassessment X - Simple Wound Assessment / Reassessment - one wound 1 5 []  - Complex Wound Assessment / Reassessment - multiple wounds 0 []  - Dermatologic / Skin Assessment (not related to wound area) 0 ASSESSMENTS - Focused Assessment []  - Circumferential Edema Measurements - multi extremities 0 []  - Nutritional Assessment / Counseling / Intervention 0 []  - Lower Extremity Assessment (monofilament, tuning fork, pulses) 0 []  - Peripheral Arterial Disease Assessment (using hand held doppler) 0 ASSESSMENTS - Ostomy and/or Continence Assessment and Care []  - Incontinence Assessment and Management 0 []  - Ostomy Care Assessment and Management (repouching, etc.) 0 PROCESS - Coordination of Care X - Simple Patient / Family Education for ongoing care 1 15 []  - Complex (extensive) Patient / Family Education for ongoing care 0 []  - Staff obtains Programmer, systems, Records, Test Results / Process Orders 0 []  - Staff telephones HHA, Nursing Homes / Clarify orders / etc 0 []  - Routine Transfer to another Facility (non-emergent condition) 0 Martone, Kaitelyn I. ([id number]) []  - Routine Hospital Admission (non-emergent condition) 0 []  - New Admissions / Biomedical engineer / Ordering NPWT, Apligraf, etc. 0 []  - Emergency Hospital Admission (emergent condition) 0 []  - Simple Discharge Coordination 0 []  - Complex (extensive) Discharge Coordination 0 PROCESS - Special Needs []  - Pediatric / Minor Patient Management 0 []  - Isolation Patient Management 0 []  - Hearing / Language / Visual special needs 0 []  - Assessment of Community assistance (transportation, D/C planning, etc.) 0 []  - Additional assistance / Altered mentation 0 []  - Support Surface(s) Assessment (bed, cushion, seat, etc.) 0 INTERVENTIONS - Wound Cleansing / Measurement X - Simple Wound Cleansing - one wound 1 5 []  -  Complex Wound Cleansing - multiple wounds 0 X - Wound Imaging (photographs - any number of wounds) 1 5 []  - Wound Tracing (instead of photographs) 0 X -  Simple Wound Measurement - one wound 1 5 []  - Complex Wound Measurement - multiple wounds 0 INTERVENTIONS - Wound Dressings X - Small Wound Dressing one or multiple wounds 1 10 []  - Medium Wound Dressing one or multiple wounds 0 []  - Large Wound Dressing one or multiple wounds 0 []  - Application of Medications - topical 0 []  - Application of Medications - injection 0 INTERVENTIONS - Miscellaneous []  - External ear exam 0 Gilchrest, Debara I. ([id number]) []  - Specimen Collection (cultures, biopsies, blood, body fluids, etc.) 0 []  - Specimen(s) / Culture(s) sent or taken to Lab for analysis 0 []  - Patient Transfer (multiple staff / Harrel Lemon Lift / Similar devices) 0 []  - Simple Staple / Suture removal (25 or less) 0 []  - Complex Staple / Suture removal (26 or more) 0 []  - Hypo / Hyperglycemic Management (close monitor of Blood Glucose) 0 []  - Ankle / Brachial Index (ABI) - do not check if billed separately 0 X - Vital Signs 1 5 Has the patient been seen at the hospital within the last three years: Yes Total Score: 65 Level Of Care: New/Established - Level 2 Electronic Signature(s) Signed: 05/28/2016 5:32:33 PM By: Regan Lemming BSN, RN Entered By: Regan Lemming on 05/28/2016 15:38:18 [name] ([id number]) -------------------------------------------------------------------------------- Encounter Discharge Information Details Patient Name: [name] I. Date of Service: 05/28/2016 3:00 PM Medical Record Number: [id number] Patient Account Number: [id number] Date of Birth/Sex: 03/10/1946 (70 y.o. Female) Treating RN: Montey Hora Primary Care Physician: Deborra Medina Other Clinician: Referring Physician: Deborra Medina Treating Physician/Extender: Frann Rider in Treatment: 6 Encounter Discharge Information Items Discharge  Pain Level: 0 Discharge Condition: Stable Ambulatory Status: Ambulatory Discharge Destination: Home Transportation: Private Auto Accompanied By: self Schedule Follow-up Appointment: Yes Medication Reconciliation completed and provided to Patient/Care No Jimie Kuwahara: Provided on Clinical Summary of Care: 05/28/2016 Form Type Recipient Paper Patient [facility] Electronic Signature(s) Signed: 05/28/2016 3:45:05 PM By: Ruthine Dose Entered By: Ruthine Dose on 05/28/2016 15:45:05 [name] I. ([id number]) -------------------------------------------------------------------------------- Multi Wound Chart Details Patient Name: [name] I. Date of Service: 05/28/2016 3:00 PM Medical Record Number: [id number] Patient Account Number: [id number] Date of Birth/Sex: May 08, 1946 (70 y.o. Female) Treating RN: Baruch Gouty, RN, BSN, Velva Harman Primary Care Physician: Deborra Medina Other Clinician: Referring Physician: Deborra Medina Treating Physician/Extender: Frann Rider in Treatment: 6 Vital Signs Height(in): 61 Pulse(bpm): 78 Weight(lbs): 125 Blood Pressure 98/82 (mmHg): Body Mass Index(BMI): 24 Temperature(F): 98.1 Respiratory Rate 18 (breaths/min): Photos: [N/A:N/A] Wound Location: Right Abdomen - midline - N/A N/A Medial Wounding Event: Gradually Appeared N/A N/A Primary Etiology: Abscess N/A N/A Comorbid History: Confinement Anxiety N/A N/A Date Acquired: 02/27/2016 N/A N/A Weeks of Treatment: 6 N/A N/A Wound Status: Open N/A N/A Measurements L x W x D 0.4x1.3x0.1 N/A N/A (cm) Area (cm) : 0.408 N/A N/A Volume (cm) : 0.041 N/A N/A % Reduction in Area: 58.50% N/A N/A % Reduction in Volume: 86.10% N/A N/A Classification: Partial Thickness N/A N/A Exudate Amount: Large N/A N/A Exudate Type: Serous N/A N/A Exudate Color: amber N/A N/A Wound Margin: Flat and Intact N/A N/A Granulation Amount: Medium (34-66%) N/A N/A Granulation Quality: Pink N/A N/A Necrotic Amount: Medium  (34-66%) N/A N/A [place], Rabiah I. ([id number]) Exposed Structures: Fascia: No N/A N/A Fat: No Tendon: No Muscle: No Joint: No Bone: No Limited to Skin Breakdown Epithelialization: Small (1-33%) N/A N/A Periwound Skin Texture: Edema: No N/A N/A Excoriation: No Induration: No Callus: No Crepitus: No Fluctuance: No Friable: No Rash: No Scarring: No Periwound  Skin Moist: Yes N/A N/A Moisture: Maceration: No Dry/Scaly: No Periwound Skin Color: Atrophie Blanche: No N/A N/A Cyanosis: No Ecchymosis: No Erythema: No Hemosiderin Staining: No Mottled: No Pallor: No Rubor: No Temperature: No Abnormality N/A N/A Tenderness on No N/A N/A Palpation: Wound Preparation: Ulcer Cleansing: N/A N/A Rinsed/Irrigated with Saline Topical Anesthetic Applied: Other: lidocaine 4% Treatment Notes Electronic Signature(s) Signed: 05/28/2016 5:32:33 PM By: Regan Lemming BSN, RN Entered By: Regan Lemming on 05/28/2016 15:36:09 [name] -------------------------------------------------------------------------------- [place] Details Patient Name: [name] I. Date of Service: 05/28/2016 3:00 PM Medical Record Number: [id number] Patient Account Number: [id number] Date of Birth/Sex: 1946/03/13 (69 y.o. Female) Treating RN: Afful, RN, BSN, Velva Harman Primary Care Physician: Deborra Medina Other Clinician: Referring Physician: Deborra Medina Treating Physician/Extender: Frann Rider in Treatment: 6 Active Inactive Malignancy/Atypical Etiology Nursing Diagnoses: Knowledge deficit related to disease process and management of atypical ulcer etiology Goals: Patient/caregiver will verbalize understanding of disease process and disease management of atypical ulcer etiology Date Initiated: 04/16/2016 Goal Status: Active Interventions: Assess patient and family medical history for signs and symptoms of malignancy/atypical etiology upon admission Provide  education on atypical ulcer etiologies Provide education on malignant ulcerations Treatment Activities: Biopsy for Pathology : 04/16/2016 Test ordered outside of clinic : 04/16/2016 Notes: Nutrition Nursing Diagnoses: Potential for alteratiion in Nutrition/Potential for imbalanced nutrition Goals: Patient/caregiver agrees to and verbalizes understanding of need to use nutritional supplements and/or vitamins as prescribed Date Initiated: 04/16/2016 Goal Status: Active Interventions: Assess patient nutrition upon admission and as needed per policy Provide education on nutrition JEYLEEN, LUCKER I. ([id number]) Notes: Orientation to the Wound Care Program Nursing Diagnoses: Knowledge deficit related to the wound healing center program Goals: Patient/caregiver will verbalize understanding of the [place] Program Date Initiated: 04/16/2016 Goal Status: Active Interventions: Provide education on orientation to the wound center Notes: Wound/Skin Impairment Nursing Diagnoses: Impaired tissue integrity Goals: Patient/caregiver will verbalize understanding of skin care regimen Date Initiated: 04/16/2016 Goal Status: Active Ulcer/skin breakdown will heal within 14 weeks Date Initiated: 04/16/2016 Goal Status: Active Interventions: Assess patient/caregiver ability to obtain necessary supplies Assess patient/caregiver ability to perform ulcer/skin care regimen upon admission and as needed Assess ulceration(s) every visit Treatment Activities: Skin care regimen initiated : 04/16/2016 Topical wound management initiated : 04/16/2016 Notes: Electronic Signature(s) Signed: 05/28/2016 5:32:33 PM By: Regan Lemming BSN, RN Entered By: Regan Lemming on 05/28/2016 15:35:47 [name] I. ([id number]) -------------------------------------------------------------------------------- Pain Assessment Details Patient Name: [name] I. Date of Service: 05/28/2016 3:00 PM Medical Record  Number: [id number] Patient Account Number: [id number] Date of Birth/Sex: 11/05/1945 (70 y.o. Female) Treating RN: Montey Hora Primary Care Physician: Deborra Medina Other Clinician: Referring Physician: Deborra Medina Treating Physician/Extender: Frann Rider in Treatment: 6 Active Problems Location of Pain Severity and Description of Pain Patient Has Paino No Site Locations Pain Management and Medication Current Pain Management: Notes Topical or injectable lidocaine is offered to patient for acute pain when surgical debridement is performed. If needed, Patient is instructed to use over the counter pain medication for the following 24-48 hours after debridement. Wound care MDs do not prescribed pain medications. Patient has chronic pain or uncontrolled pain. Patient has been instructed to make an appointment with their Primary Care Physician for pain management. Electronic Signature(s) Signed: 05/28/2016 4:52:33 PM By: Montey Hora Entered By: Montey Hora on 05/28/2016 15:19:05 [name] -------------------------------------------------------------------------------- Patient/Caregiver Education Details Patient Name: [name] I. Date of Service: 05/28/2016 3:00 PM Medical Record Number: [id number] Patient  Account Number: [id number] Date of Birth/Gender: [birth year] (70 y.o. Female) Treating RN: Montey Hora Primary Care Physician: Deborra Medina Other Clinician: Referring Physician: Deborra Medina Treating Physician/Extender: Frann Rider in Treatment: 6 Education Assessment Education Provided To: Patient Education Topics Provided Wound/Skin Impairment: Handouts: Other: wound care as ordered Methods: Demonstration, Explain/Verbal Responses: State content correctly Electronic Signature(s) Signed: 05/28/2016 4:52:33 PM By: Montey Hora Entered By: Montey Hora on 05/28/2016 15:43:14 [name] I.  ([id number]) -------------------------------------------------------------------------------- Wound Assessment Details Patient Name: [name] I. Date of Service: 05/28/2016 3:00 PM Medical Record Number: [id number] Patient Account Number: [id number] Date of Birth/Sex: [birth year] (70 y.o. Female) Treating RN: Montey Hora Primary Care Physician: Deborra Medina Other Clinician: Referring Physician: Deborra Medina Treating Physician/Extender: Frann Rider in Treatment: 6 Wound Status Wound Number: 1 Primary Etiology: Abscess Wound Location: Right Abdomen - midline - Wound Status: Open Medial Comorbid History: Confinement Anxiety Wounding Event: Gradually Appeared Date Acquired: 02/27/2016 Weeks Of Treatment: 6 Clustered Wound: No Photos Wound Measurements Length: (cm) 0.4 Width: (cm) 1.3 Depth: (cm) 0.1 Area: (cm) 0.408 Volume: (cm) 0.041 % Reduction in Area: 58.5% % Reduction in Volume: 86.1% Epithelialization: Small (1-33%) Tunneling: No Undermining: No Wound Description Classification: Partial Thickness Wound Margin: Flat and Intact Exudate Amount: Large Exudate Type: Serous Exudate Color: amber Foul Odor After Cleansing: No Wound Bed Granulation Amount: Medium (34-66%) Exposed Structure Granulation Quality: Pink Fascia Exposed: No Necrotic Amount: Medium (34-66%) Fat Layer Exposed: No Necrotic Quality: Adherent Slough Tendon Exposed: No Eastep, Arwen I. ([id number]) Muscle Exposed: No Joint Exposed: No Bone Exposed: No Limited to Skin Breakdown Periwound Skin Texture Texture Color No Abnormalities Noted: No No Abnormalities Noted: No Callus: No Atrophie Blanche: No Crepitus: No Cyanosis: No Excoriation: No Ecchymosis: No Fluctuance: No Erythema: No Friable: No Hemosiderin Staining: No Induration: No Mottled: No Localized Edema: No Pallor: No Rash: No Rubor: No Scarring: No Temperature / Pain Moisture Temperature: No  Abnormality No Abnormalities Noted: No Dry / Scaly: No Maceration: No Moist: Yes Wound Preparation Ulcer Cleansing: Rinsed/Irrigated with Saline Topical Anesthetic Applied: Other: lidocaine 4%, Treatment Notes Wound #1 (Right, Medial Abdomen - midline) 1. Cleansed with: Clean wound with Normal Saline 2. Anesthetic Topical Lidocaine 4% cream to wound bed prior to debridement 3. Peri-wound Care: Skin Prep 4. Dressing Applied: Prisma Ag 5. Secondary Dressing Applied Bordered Foam Dressing Dry Gauze Electronic Signature(s) Signed: 05/28/2016 4:52:33 PM By: Montey Hora Entered By: Montey Hora on 05/28/2016 15:29:08 [name] -------------------------------------------------------------------------------- Vitals Details Patient Name: [name] I. Date of Service: 05/28/2016 3:00 PM Medical Record Number: [id number] Patient Account Number: [id number] Date of Birth/Sex: [birth year] (70 y.o. Female) Treating RN: Montey Hora Primary Care Physician: Deborra Medina Other Clinician: Referring Physician: Deborra Medina Treating Physician/Extender: Frann Rider in Treatment: 6 Vital Signs Time Taken: 15:20 Temperature (F): 98.1 Height (in): 61 Pulse (bpm): 78 Weight (lbs): 125 Respiratory Rate (breaths/min): 18 Body Mass Index (BMI): 23.6 Blood Pressure (mmHg): 98/82 Reference Range: 80 - 120 mg / dl Electronic Signature(s) Signed: 05/28/2016 4:52:33 PM By: Montey Hora Entered By: Montey Hora on 05/28/2016 15:21:18

## 2016-06-02 ENCOUNTER — Ambulatory Visit: Payer: PPO | Admitting: Internal Medicine

## 2016-06-04 ENCOUNTER — Encounter: Payer: PPO | Attending: Surgery | Admitting: Surgery

## 2016-06-04 ENCOUNTER — Ambulatory Visit: Payer: PPO

## 2016-06-04 DIAGNOSIS — D631 Anemia in chronic kidney disease: Secondary | ICD-10-CM | POA: Insufficient documentation

## 2016-06-04 DIAGNOSIS — N3941 Urge incontinence: Secondary | ICD-10-CM | POA: Insufficient documentation

## 2016-06-04 DIAGNOSIS — K219 Gastro-esophageal reflux disease without esophagitis: Secondary | ICD-10-CM | POA: Insufficient documentation

## 2016-06-04 DIAGNOSIS — Z7982 Long term (current) use of aspirin: Secondary | ICD-10-CM | POA: Diagnosis not present

## 2016-06-04 DIAGNOSIS — Z87891 Personal history of nicotine dependence: Secondary | ICD-10-CM | POA: Diagnosis not present

## 2016-06-04 DIAGNOSIS — L02221 Furuncle of abdominal wall: Secondary | ICD-10-CM | POA: Diagnosis not present

## 2016-06-04 DIAGNOSIS — N183 Chronic kidney disease, stage 3 (moderate): Secondary | ICD-10-CM | POA: Diagnosis not present

## 2016-06-04 DIAGNOSIS — S31105A Unspecified open wound of abdominal wall, periumbilic region without penetration into peritoneal cavity, initial encounter: Secondary | ICD-10-CM | POA: Diagnosis present

## 2016-06-04 DIAGNOSIS — F419 Anxiety disorder, unspecified: Secondary | ICD-10-CM | POA: Insufficient documentation

## 2016-06-04 DIAGNOSIS — Z79899 Other long term (current) drug therapy: Secondary | ICD-10-CM | POA: Diagnosis not present

## 2016-06-04 DIAGNOSIS — E559 Vitamin D deficiency, unspecified: Secondary | ICD-10-CM | POA: Insufficient documentation

## 2016-06-04 DIAGNOSIS — E039 Hypothyroidism, unspecified: Secondary | ICD-10-CM | POA: Insufficient documentation

## 2016-06-04 DIAGNOSIS — X58XXXA Exposure to other specified factors, initial encounter: Secondary | ICD-10-CM | POA: Diagnosis not present

## 2016-06-04 DIAGNOSIS — M810 Age-related osteoporosis without current pathological fracture: Secondary | ICD-10-CM | POA: Insufficient documentation

## 2016-06-04 DIAGNOSIS — Z9884 Bariatric surgery status: Secondary | ICD-10-CM | POA: Diagnosis not present

## 2016-06-05 NOTE — Progress Notes (Signed)
DERYL, UMPHLETT ([id number]) Visit Report for 06/04/2016 Chief Complaint Document Details Patient Name: Meagan Zavala, [name] Date of Service: 06/04/2016 12:45 PM Medical Record Number: [id number] Patient Account Number: [id number] Date of Birth/Sex: [birth year] (70 y.o. Female) Treating RN: Montey Hora Primary Care Physician: Deborra Medina Other Clinician: Referring Physician: Deborra Medina Treating Physician/Extender: Frann Rider in Treatment: 7 Information Obtained from: Patient Chief Complaint Patient seen for complaints of Non-Healing Wound to the right of from licorice on her abdominal wall for about a month Electronic Signature(s) Signed: 06/04/2016 1:22:26 PM By: Christin Fudge MD, FACS Entered By: Christin Fudge on 06/04/2016 13:22:26 [name] ([id number]) -------------------------------------------------------------------------------- HPI Details Patient Name: Meagan Zavala I. Date of Service: 06/04/2016 12:45 PM Medical Record Number: [id number] Patient Account Number: [id number] Date of Birth/Sex: [birth year] (70 y.o. Female) Treating RN: Montey Hora Primary Care Physician: Deborra Medina Other Clinician: Referring Physician: Deborra Medina Treating Physician/Extender: Frann Rider in Treatment: 7 History of Present Illness Location: umbilical region of the abdominal wall Quality: an open ulcerated area some amount of pain and burning Severity: Patient states wound are getting worse. Duration: Patient has had the wound for < 4 weeks prior to presenting for treatment Timing: Pain in wound is Intermittent (comes and goes Context: The wound appeared gradually over time Modifying Factors: Other treatment(s) tried include:and doxycycline the present time Associated Signs and Symptoms: Patient reports having increase discharge. HPI Description: 70 year old patient who was recently seen by Dr. Tommi Rumps was noted to have a small ulceration on abdomen  wall. the patient has had it for about 6 weeks in the periumbilical areaThe patient noted that it has become slightly larger and draining purulent material.was started on doxycycline and given an appointment to the wound center. She does not recall if she had any insect bite or injury her past medical history significant for GERD, hypothyroidism, osteoporosis, chronic kidney disease stage III, chronic low back pain, anemia, vitamin D deficiency and urge incontinence. 7/27//2017 -- pathology report of specimen done -- DIAGNOSIS: A. SKIN, ABDOMEN; BIOPSY: - CHRONIC ULCER. - NEGATIVE FOR MALIGNANCY Electronic Signature(s) Signed: 06/04/2016 1:22:38 PM By: Christin Fudge MD, FACS Entered By: Christin Fudge on 06/04/2016 13:22:38 [name] ([id number]) -------------------------------------------------------------------------------- Physical Exam Details Patient Name: Meagan Zavala I. Date of Service: 06/04/2016 12:45 PM Medical Record Number: [id number] Patient Account Number: [id number] Date of Birth/Sex: [birth year] (70 y.o. Female) Treating RN: Montey Hora Primary Care Physician: Deborra Medina Other Clinician: Referring Physician: Deborra Medina Treating Physician/Extender: Frann Rider in Treatment: 7 Constitutional . Pulse regular. Respirations normal and unlabored. Afebrile. . Eyes Nonicteric. Reactive to light. Ears, Nose, Mouth, and Throat Lips, teeth, and gums WNL.Marland Kitchen Moist mucosa without lesions. Neck supple and nontender. No palpable supraclavicular or cervical adenopathy. Normal sized without goiter. Respiratory WNL. No retractions.. Breath sounds WNL, No rubs, rales, rhonchi, or wheeze.. Cardiovascular Meagan rhythm and rate regular, no murmur or gallop.. Pedal Pulses WNL. No clubbing, cyanosis or edema. Lymphatic No adneopathy. No adenopathy. No adenopathy. Musculoskeletal Adexa without tenderness or enlargement.. Digits and nails w/o clubbing, cyanosis, infection,  petechiae, ischemia, or inflammatory conditions.. Integumentary (Hair, Skin) No suspicious lesions. No crepitus or fluctuance. No peri-wound warmth or erythema. No masses.Marland Kitchen Psychiatric Judgement and insight Intact.. No evidence of depression, anxiety, or agitation.. Notes wound is looking good and has some epithelialization around one of the edges. No sharp debridement was required today. Electronic Signature(s) Signed: 06/04/2016 1:23:06 PM By: Christin Fudge MD, FACS Entered By: Christin Fudge on 06/04/2016 13:23:04 Pisani,  Lynnda Child ([id number]) -------------------------------------------------------------------------------- Physician Orders Details Patient Name: Zavala, [name] Date of Service: 06/04/2016 12:45 PM Medical Record Number: [id number] Patient Account Number: [id number] Date of Birth/Sex: [birth year] (70 y.o. Female) Treating RN: Montey Hora Primary Care Physician: Deborra Medina Other Clinician: Referring Physician: Deborra Medina Treating Physician/Extender: Frann Rider in Treatment: 7 Verbal / Phone Orders: Yes Clinician: Montey Hora Read Back and Verified: Yes Diagnosis Coding Wound Cleansing Wound #1 Right,Medial Abdomen - midline o Clean wound with Normal Saline. Anesthetic Wound #1 Right,Medial Abdomen - midline o Topical Lidocaine 4% cream applied to wound bed prior to debridement o Injected 2% Lidocaine without epinephrine prior to debridement Primary Wound Dressing Wound #1 Right,Medial Abdomen - midline o Prisma Ag Secondary Dressing Wound #1 Right,Medial Abdomen - midline o Dry Gauze o Boardered Foam Dressing Dressing Change Frequency Wound #1 Right,Medial Abdomen - midline o Change dressing every day. Follow-up Appointments Wound #1 Right,Medial Abdomen - midline o Return Appointment in 1 week. Additional Orders / Instructions Wound #1 Right,Medial Abdomen - midline o Increase protein intake. o Activity as  tolerated o Other: - Vitamin C, A, MVI, ZINC Electronic Signature(s) Signed: 06/04/2016 4:26:59 PM By: Christin Fudge MD, FACS Zavala, [name] ([id number]) Signed: 06/04/2016 5:53:50 PM By: Montey Hora Entered By: Montey Hora on 06/04/2016 13:06:50 Meagan Zavala IMarland Kitchen ([id number]) -------------------------------------------------------------------------------- Problem List Details Patient Name: Meagan Zavala I. Date of Service: 06/04/2016 12:45 PM Medical Record Number: [id number] Patient Account Number: [id number] Date of Birth/Sex: 1946-06-25 (70 y.o. 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 Date of Service: 06/04/2016 12:45 PM Medical Record Number: [id number] Patient Account Number: [id number] Date of Birth/Sex: November 06, 1945 (70 y.o. Female) Treating RN: Montey Hora Primary Care Physician: Deborra Medina Other Clinician: Referring Physician: Deborra Medina Treating Physician/Extender: Frann Rider in Treatment: 7 Subjective Chief Complaint Information obtained from Patient Patient seen for complaints of Non-Healing Wound to  the right of from licorice on her abdominal wall for about a month History of Present Illness (HPI) The following HPI elements were documented for the patient's wound: Location: umbilical region of the abdominal wall Quality: an open ulcerated area some amount of pain and burning Severity: Patient states wound are getting worse. Duration: Patient has had the wound for < 4 weeks prior to presenting for treatment Timing: Pain in wound is Intermittent (comes and goes Context: The wound appeared gradually over time Modifying Factors: Other treatment(s) tried include:and doxycycline the present time Associated Signs and Symptoms: Patient reports having increase discharge. 70 year old patient who was recently seen by Dr. Tommi Rumps was noted to have a small ulceration on abdomen wall. the patient has had it for about 6 weeks in the periumbilical areaThe patient noted that it has become slightly larger and draining purulent material.was started on doxycycline and given an appointment to the wound center. She does not recall if she had any insect bite or injury her past medical history significant  for GERD, hypothyroidism, osteoporosis, chronic kidney disease stage III, chronic low back pain, anemia, vitamin D deficiency and urge incontinence. 7/27//2017 -- pathology report of specimen done -- DIAGNOSIS: A. SKIN, ABDOMEN; BIOPSY: - CHRONIC ULCER. - NEGATIVE FOR MALIGNANCY Objective Constitutional Pulse regular. Respirations normal and unlabored. Afebrile. Vitals Time Taken: 12:57 PM, Height: 61 in, Weight: 125 lbs, BMI: 23.6, Temperature: 98.3 F, Pulse: 68 bpm, Respiratory Rate: 18 breaths/min, Blood Pressure: 139/79 mmHg. Zavala, [name] ([id number]) Eyes Nonicteric. Reactive to light. Ears, Nose, Mouth, and Throat Lips, teeth, and gums WNL.Marland Kitchen Moist mucosa without lesions. Neck supple and nontender. No palpable supraclavicular or cervical adenopathy. Normal sized without  goiter. Respiratory WNL. No retractions.. Breath sounds WNL, No rubs, rales, rhonchi, or wheeze.. Cardiovascular Meagan rhythm and rate regular, no murmur or gallop.. Pedal Pulses WNL. No clubbing, cyanosis or edema. Lymphatic No adneopathy. No adenopathy. No adenopathy. Musculoskeletal Adexa without tenderness or enlargement.. Digits and nails w/o clubbing, cyanosis, infection, petechiae, ischemia, or inflammatory conditions.Marland Kitchen Psychiatric Judgement and insight Intact.. No evidence of depression, anxiety, or agitation.. General Notes: wound is looking good and has some epithelialization around one of the edges. No sharp debridement was required today. Integumentary (Hair, Skin) No suspicious lesions. No crepitus or fluctuance. No peri-wound warmth or erythema. No masses.. Wound #1 status is Open. Original cause of wound was Gradually Appeared. The wound is located on the Right,Medial Abdomen - midline. The wound measures 0.5cm length x 0.9cm width x 0.1cm depth; 0.353cm^2 area and 0.035cm^3 volume. The wound is limited to skin breakdown. There is no tunneling or undermining noted. There is a large amount of serous drainage noted. The wound margin is flat and intact. There is large (67-100%) pink granulation within the wound bed. There is a small (1-33%) amount of necrotic tissue within the wound bed including Adherent Slough. The periwound skin appearance exhibited: Moist. The periwound skin appearance did not exhibit: Callus, Crepitus, Excoriation, Fluctuance, Friable, Induration, Localized Edema, Rash, Scarring, Dry/Scaly, Maceration, Atrophie Blanche, Cyanosis, Ecchymosis, Hemosiderin Staining, Mottled, Pallor, Rubor, Erythema. Periwound temperature was noted as No Abnormality. Assessment Zavala, [name] ([id number]) Active Problems ICD-10 S31.105A - Unspecified open wound of abdominal wall, periumbilic region without penetration into peritoneal cavity, initial encounter L02.221 -  Furuncle of abdominal wall Plan Wound Cleansing: Wound #1 Right,Medial Abdomen - midline: Clean wound with Normal Saline. Anesthetic: Wound #1 Right,Medial Abdomen - midline: Topical Lidocaine 4% cream applied to wound bed prior to debridement Injected 2% Lidocaine without epinephrine prior to debridement Primary Wound Dressing: Wound #1 Right,Medial Abdomen - midline: Prisma Ag Secondary Dressing: Wound #1 Right,Medial Abdomen - midline: Dry Gauze Boardered Foam Dressing Dressing Change Frequency: Wound #1 Right,Medial Abdomen - midline: Change dressing every day. Follow-up Appointments: Wound #1 Right,Medial Abdomen - midline: Return Appointment in 1 week. Additional Orders / Instructions: Wound #1 Right,Medial Abdomen - midline: Increase protein intake. Activity as tolerated Other: - Vitamin C, A, MVI, ZINC The wound is looking very clean and I have recommended we continue with Prisma AG on alternate days and offload this area as much as possible. I have asked her to spread her visits to every 2 week if she so desires. Zavala, Meagan Zavala ([id number]) Electronic Signature(s) Signed: 06/04/2016 1:23:43 PM By: Christin Fudge MD, FACS Entered By: Christin Fudge on 06/04/2016 13:23:42 [name] ([id number]) -------------------------------------------------------------------------------- SuperBill Details Patient Name: Meagan Zavala I. Date of Service: 06/04/2016 Medical Record Number: [id number] Patient Account Number: [id number] Date of Birth/Sex: 03-02-1946 (70 y.o. Female) Treating RN: Montey Hora  Primary Care Physician: Deborra Medina Other Clinician: Referring Physician: Deborra Medina Treating Physician/Extender: Frann Rider in Treatment: 7 Diagnosis Coding ICD-10 Codes Code Description Unspecified open wound of abdominal wall, periumbilic region without penetration into S31.105A peritoneal cavity, initial encounter L02.221 Furuncle of abdominal  wall Facility Procedures CPT4 Code: [id number] Description: [phone] - WOUND CARE VISIT-LEV 2 EST PT Modifier: Quantity: 1 Physician Procedures CPT4: Description Modifier Quantity Code [id number] - WC PHYS LEVEL 3 - EST PT 1 ICD-10 Description Diagnosis S31.105A Unspecified open wound of abdominal wall, periumbilic region without penetration into peritoneal cavity, initial encounter L02.221  Furuncle of abdominal wall Electronic Signature(s) Signed: 06/04/2016 1:23:54 PM By: Christin Fudge MD, FACS Entered By: Christin Fudge on 06/04/2016 13:23:54

## 2016-06-05 NOTE — Progress Notes (Signed)
Meagan Zavala, Meagan Zavala ([id number]) Visit Report for 06/04/2016 Arrival Information Details Patient Name: Meagan Zavala, Meagan Zavala I. Date of Service: 06/04/2016 12:45 PM Medical Record Number: [id number] Patient Account Number: [id number] Date of Birth/Sex: 1946/05/03 (70 y.o. Female) Treating RN: Montey Hora Primary Care Physician: Deborra Medina Other Clinician: Referring Physician: Deborra Medina Treating Physician/Extender: Frann Rider in Treatment: 7 Visit Information History Since Last Visit Added or deleted any medications: No Patient Arrived: Ambulatory Any new allergies or adverse reactions: No Arrival Time: 12:56 Had a fall or experienced change in No Accompanied By: self activities of daily living that may affect Transfer Assistance: None risk of falls: Patient Identification Verified: Yes Signs or symptoms of abuse/neglect since last No Secondary Verification Process Yes visito Completed: Hospitalized since last visit: No Patient Requires Transmission-Based No Pain Present Now: No Precautions: Patient Has Alerts: No Electronic Signature(s) Signed: 06/04/2016 5:53:50 PM By: Montey Hora Entered By: Montey Hora on 06/04/2016 12:57:06 [name] ([id number]) -------------------------------------------------------------------------------- Clinic Level of Care Assessment Details Patient Name: Meagan Zavala I. Date of Service: 06/04/2016 12:45 PM Medical Record Number: [id number] Patient Account Number: [id number] Date of Birth/Sex: 1946/08/19 (70 y.o. Female) Treating RN: Montey Hora Primary Care Physician: Deborra Medina Other Clinician: Referring Physician: Deborra Medina Treating Physician/Extender: Frann Rider in Treatment: 7 Clinic Level of Care Assessment Items TOOL 4 Quantity Score []  - Use when only an EandM is performed on FOLLOW-UP visit 0 ASSESSMENTS - Nursing Assessment / Reassessment X - Reassessment of Co-morbidities (includes updates in  patient status) 1 10 X - Reassessment of Adherence to Treatment Plan 1 5 ASSESSMENTS - Wound and Skin Assessment / Reassessment X - Simple Wound Assessment / Reassessment - one wound 1 5 []  - Complex Wound Assessment / Reassessment - multiple wounds 0 []  - Dermatologic / Skin Assessment (not related to wound area) 0 ASSESSMENTS - Focused Assessment []  - Circumferential Edema Measurements - multi extremities 0 []  - Nutritional Assessment / Counseling / Intervention 0 []  - Lower Extremity Assessment (monofilament, tuning fork, pulses) 0 []  - Peripheral Arterial Disease Assessment (using hand held doppler) 0 ASSESSMENTS - Ostomy and/or Continence Assessment and Care []  - Incontinence Assessment and Management 0 []  - Ostomy Care Assessment and Management (repouching, etc.) 0 PROCESS - Coordination of Care X - Simple Patient / Family Education for ongoing care 1 15 []  - Complex (extensive) Patient / Family Education for ongoing care 0 []  - Staff obtains Programmer, systems, Records, Test Results / Process Orders 0 []  - Staff telephones HHA, Nursing Homes / Clarify orders / etc 0 []  - Routine Transfer to another Facility (non-emergent condition) 0 Meagan Zavala, [name] ([id number]) []  - Routine Hospital Admission (non-emergent condition) 0 []  - New Admissions / Biomedical engineer / Ordering NPWT, Apligraf, etc. 0 []  - Emergency Hospital Admission (emergent condition) 0 X - Simple Discharge Coordination 1 10 []  - Complex (extensive) Discharge Coordination 0 PROCESS - Special Needs []  - Pediatric / Minor Patient Management 0 []  - Isolation Patient Management 0 []  - Hearing / Language / Visual special needs 0 []  - Assessment of Community assistance (transportation, D/C planning, etc.) 0 []  - Additional assistance / Altered mentation 0 []  - Support Surface(s) Assessment (bed, cushion, seat, etc.) 0 INTERVENTIONS - Wound Cleansing / Measurement X - Simple Wound Cleansing - one wound 1 5 []  - Complex  Wound Cleansing - multiple wounds 0 X - Wound Imaging (photographs - any number of wounds) 1 5 []  - Wound Tracing (instead of photographs) 0 X -  Simple Wound Measurement - one wound 1 5 []  - Complex Wound Measurement - multiple wounds 0 INTERVENTIONS - Wound Dressings X - Small Wound Dressing one or multiple wounds 1 10 []  - Medium Wound Dressing one or multiple wounds 0 []  - Large Wound Dressing one or multiple wounds 0 []  - Application of Medications - topical 0 []  - Application of Medications - injection 0 INTERVENTIONS - Miscellaneous []  - External ear exam 0 Meagan Zavala, [name] ([id number]) []  - Specimen Collection (cultures, biopsies, blood, body fluids, etc.) 0 []  - Specimen(s) / Culture(s) sent or taken to Lab for analysis 0 []  - Patient Transfer (multiple staff / Harrel Lemon Lift / Similar devices) 0 []  - Simple Staple / Suture removal (25 or less) 0 []  - Complex Staple / Suture removal (26 or more) 0 []  - Hypo / Hyperglycemic Management (close monitor of Blood Glucose) 0 []  - Ankle / Brachial Index (ABI) - do not check if billed separately 0 X - Vital Signs 1 5 Has the patient been seen at the hospital within the last three years: Yes Total Score: 75 Level Of Care: New/Established - Level 2 Electronic Signature(s) Signed: 06/04/2016 5:53:50 PM By: Montey Hora Entered By: Montey Hora on 06/04/2016 13:12:50 [name] ([id number]) -------------------------------------------------------------------------------- Encounter Discharge Information Details Patient Name: Meagan Zavala I. Date of Service: 06/04/2016 12:45 PM Medical Record Number: [id number] Patient Account Number: [id number] Date of Birth/Sex: Nov 29, 1945 (70 y.o. Female) Treating RN: Montey Hora Primary Care Physician: Deborra Medina Other Clinician: Referring Physician: Deborra Medina Treating Physician/Extender: Frann Rider in Treatment: 7 Encounter Discharge Information Items Discharge Pain Level:  0 Discharge Condition: Stable Ambulatory Status: Ambulatory Discharge Destination: Home Transportation: Private Auto Accompanied By: self Schedule Follow-up Appointment: Yes Medication Reconciliation completed and provided to Patient/Care No Jorden Minchey: Provided on Clinical Summary of Care: 06/04/2016 Form Type Recipient Paper Patient [facility] Electronic Signature(s) Signed: 06/04/2016 1:14:11 PM By: Ruthine Dose Entered By: Ruthine Dose on 06/04/2016 13:14:11 Meagan Zavala I. ([id number]) -------------------------------------------------------------------------------- Multi Wound Chart Details Patient Name: Meagan Zavala I. Date of Service: 06/04/2016 12:45 PM Medical Record Number: [id number] Patient Account Number: [id number] Date of Birth/Sex: 03/05/1946 (70 y.o. Female) Treating RN: Montey Hora Primary Care Physician: Deborra Medina Other Clinician: Referring Physician: Deborra Medina Treating Physician/Extender: Frann Rider in Treatment: 7 Vital Signs Height(in): 61 Pulse(bpm): 68 Weight(lbs): 125 Blood Pressure 139/79 (mmHg): Body Mass Index(BMI): 24 Temperature(F): 98.3 Respiratory Rate 18 (breaths/min): Photos: [N/A:N/A] Wound Location: Right Abdomen - midline - N/A N/A Medial Wounding Event: Gradually Appeared N/A N/A Primary Etiology: Abscess N/A N/A Comorbid History: Confinement Anxiety N/A N/A Date Acquired: 02/27/2016 N/A N/A Weeks of Treatment: 7 N/A N/A Wound Status: Open N/A N/A Measurements L x W x D 0.5x0.9x0.1 N/A N/A (cm) Area (cm) : 0.353 N/A N/A Volume (cm) : 0.035 N/A N/A % Reduction in Area: 64.10% N/A N/A % Reduction in Volume: 88.10% N/A N/A Classification: Partial Thickness N/A N/A Exudate Amount: Large N/A N/A Exudate Type: Serous N/A N/A Exudate Color: amber N/A N/A Wound Margin: Flat and Intact N/A N/A Granulation Amount: Large (67-100%) N/A N/A Granulation Quality: Pink N/A N/A Necrotic Amount: Small (1-33%) N/A  N/A Meagan Zavala, [name] ([id number]) Exposed Structures: Fascia: No N/A N/A Fat: No Tendon: No Muscle: No Joint: No Bone: No Limited to Skin Breakdown Epithelialization: Small (1-33%) N/A N/A Periwound Skin Texture: Edema: No N/A N/A Excoriation: No Induration: No Callus: No Crepitus: No Fluctuance: No Friable: No Rash: No Scarring: No Periwound Skin Moist: Yes N/A  N/A Moisture: Maceration: No Dry/Scaly: No Periwound Skin Color: Atrophie Blanche: No N/A N/A Cyanosis: No Ecchymosis: No Erythema: No Hemosiderin Staining: No Mottled: No Pallor: No Rubor: No Temperature: No Abnormality N/A N/A Tenderness on No N/A N/A Palpation: Wound Preparation: Ulcer Cleansing: N/A N/A Rinsed/Irrigated with Saline Topical Anesthetic Applied: Other: lidocaine 4% Treatment Notes Electronic Signature(s) Signed: 06/04/2016 5:53:50 PM By: Montey Hora Entered By: Montey Hora on 06/04/2016 13:03:49 [name] ([id number]) -------------------------------------------------------------------------------- [place] Details Patient Name: Meagan Zavala I. Date of Service: 06/04/2016 12:45 PM Medical Record Number: [id number] Patient Account Number: [id number] Date of Birth/Sex: July 15, 1946 (70 y.o. Female) Treating RN: Montey Hora Primary Care Physician: Deborra Medina Other Clinician: Referring Physician: Deborra Medina Treating Physician/Extender: Frann Rider in Treatment: 7 Active Inactive Malignancy/Atypical Etiology Nursing Diagnoses: Knowledge deficit related to disease process and management of atypical ulcer etiology Goals: Patient/caregiver will verbalize understanding of disease process and disease management of atypical ulcer etiology Date Initiated: 04/16/2016 Goal Status: Active Interventions: Assess patient and family medical history for signs and symptoms of malignancy/atypical etiology upon admission Provide education on atypical  ulcer etiologies Provide education on malignant ulcerations Treatment Activities: Biopsy for Pathology : 04/16/2016 Test ordered outside of clinic : 04/16/2016 Notes: Nutrition Nursing Diagnoses: Potential for alteratiion in Nutrition/Potential for imbalanced nutrition Goals: Patient/caregiver agrees to and verbalizes understanding of need to use nutritional supplements and/or vitamins as prescribed Date Initiated: 04/16/2016 Goal Status: Active Interventions: Assess patient nutrition upon admission and as needed per policy Provide education on nutrition Meagan Zavala, [name] ([id number]) Notes: Orientation to the Wound Care Program Nursing Diagnoses: Knowledge deficit related to the wound healing center program Goals: Patient/caregiver will verbalize understanding of the [place] Program Date Initiated: 04/16/2016 Goal Status: Active Interventions: Provide education on orientation to the wound center Notes: Wound/Skin Impairment Nursing Diagnoses: Impaired tissue integrity Goals: Patient/caregiver will verbalize understanding of skin care regimen Date Initiated: 04/16/2016 Goal Status: Active Ulcer/skin breakdown will heal within 14 weeks Date Initiated: 04/16/2016 Goal Status: Active Interventions: Assess patient/caregiver ability to obtain necessary supplies Assess patient/caregiver ability to perform ulcer/skin care regimen upon admission and as needed Assess ulceration(s) every visit Treatment Activities: Skin care regimen initiated : 04/16/2016 Topical wound management initiated : 04/16/2016 Notes: Electronic Signature(s) Signed: 06/04/2016 5:53:50 PM By: Montey Hora Entered By: Montey Hora on 06/04/2016 13:03:35 Meagan Zavala IMarland Kitchen ([id number]) -------------------------------------------------------------------------------- Pain Assessment Details Patient Name: Meagan Zavala I. Date of Service: 06/04/2016 12:45 PM Medical Record Number: [id number] Patient  Account Number: [id number] Date of Birth/Sex: 1946/04/06 (70 y.o. Female) Treating RN: Montey Hora Primary Care Physician: Deborra Medina Other Clinician: Referring Physician: Deborra Medina Treating Physician/Extender: Frann Rider in Treatment: 7 Active Problems Location of Pain Severity and Description of Pain Patient Has Paino No Site Locations Pain Management and Medication Current Pain Management: Notes Topical or injectable lidocaine is offered to patient for acute pain when surgical debridement is performed. If needed, Patient is instructed to use over the counter pain medication for the following 24-48 hours after debridement. Wound care MDs do not prescribed pain medications. Patient has chronic pain or uncontrolled pain. Patient has been instructed to make an appointment with their Primary Care Physician for pain management. Electronic Signature(s) Signed: 06/04/2016 5:53:50 PM By: Montey Hora Entered By: Montey Hora on 06/04/2016 12:57:14 [name] ([id number]) -------------------------------------------------------------------------------- Patient/Caregiver Education Details Patient Name: Meagan Zavala I. Date of Service: 06/04/2016 12:45 PM Medical Record Number: [id number] Patient Account Number: [id number] Date of Birth/Gender: 06/14/1946 (70 y.o. Female)  Treating RN: Montey Hora Primary Care Physician: Deborra Medina Other Clinician: Referring Physician: Deborra Medina Treating Physician/Extender: Frann Rider in Treatment: 7 Education Assessment Education Provided To: Patient Education Topics Provided Wound/Skin Impairment: Handouts: Other: wound care to continue as ordered Methods: Demonstration, Explain/Verbal Responses: State content correctly Electronic Signature(s) Signed: 06/04/2016 5:53:50 PM By: Montey Hora Entered By: Montey Hora on 06/04/2016 13:13:51 Meagan Zavala I.  ([id number]) -------------------------------------------------------------------------------- Wound Assessment Details Patient Name: Meagan Zavala I. Date of Service: 06/04/2016 12:45 PM Medical Record Number: [id number] Patient Account Number: [id number] Date of Birth/Sex: 1946/07/27 (70 y.o. Female) Treating RN: Montey Hora Primary Care Physician: Deborra Medina Other Clinician: Referring Physician: Deborra Medina Treating Physician/Extender: Frann Rider in Treatment: 7 Wound Status Wound Number: 1 Primary Etiology: Abscess Wound Location: Right Abdomen - midline - Wound Status: Open Medial Comorbid History: Confinement Anxiety Wounding Event: Gradually Appeared Date Acquired: 02/27/2016 Weeks Of Treatment: 7 Clustered Wound: No Photos Wound Measurements Length: (cm) 0.5 Width: (cm) 0.9 Depth: (cm) 0.1 Area: (cm) 0.353 Volume: (cm) 0.035 % Reduction in Area: 64.1% % Reduction in Volume: 88.1% Epithelialization: Small (1-33%) Tunneling: No Undermining: No Wound Description Classification: Partial Thickness Wound Margin: Flat and Intact Exudate Amount: Large Exudate Type: Serous Exudate Color: amber Foul Odor After Cleansing: No Wound Bed Granulation Amount: Large (67-100%) Exposed Structure Granulation Quality: Pink Fascia Exposed: No Necrotic Amount: Small (1-33%) Fat Layer Exposed: No Necrotic Quality: Adherent Slough Tendon Exposed: No Meagan Zavala, Ronnesha I. ([id number]) Muscle Exposed: No Joint Exposed: No Bone Exposed: No Limited to Skin Breakdown Periwound Skin Texture Texture Color No Abnormalities Noted: No No Abnormalities Noted: No Callus: No Atrophie Blanche: No Crepitus: No Cyanosis: No Excoriation: No Ecchymosis: No Fluctuance: No Erythema: No Friable: No Hemosiderin Staining: No Induration: No Mottled: No Localized Edema: No Pallor: No Rash: No Rubor: No Scarring: No Temperature / Pain Moisture Temperature: No Abnormality No  Abnormalities Noted: No Dry / Scaly: No Maceration: No Moist: Yes Wound Preparation Ulcer Cleansing: Rinsed/Irrigated with Saline Topical Anesthetic Applied: Other: lidocaine 4%, Treatment Notes Wound #1 (Right, Medial Abdomen - midline) 1. Cleansed with: Clean wound with Normal Saline 2. Anesthetic Topical Lidocaine 4% cream to wound bed prior to debridement 3. Peri-wound Care: Skin Prep 4. Dressing Applied: Prisma Ag 5. Secondary Dressing Applied Bordered Foam Dressing Dry Gauze Electronic Signature(s) Signed: 06/04/2016 5:53:50 PM By: Montey Hora Entered By: Montey Hora on 06/04/2016 13:03:14 Meagan Zavala IMarland Kitchen ([id number]) -------------------------------------------------------------------------------- Vitals Details Patient Name: Meagan Zavala I. Date of Service: 06/04/2016 12:45 PM Medical Record Number: [id number] Patient Account Number: [id number] Date of Birth/Sex: 05-23-1946 (70 y.o. Female) Treating RN: Montey Hora Primary Care Physician: Deborra Medina Other Clinician: Referring Physician: Deborra Medina Treating Physician/Extender: Frann Rider in Treatment: 7 Vital Signs Time Taken: 12:57 Temperature (F): 98.3 Height (in): 61 Pulse (bpm): 68 Weight (lbs): 125 Respiratory Rate (breaths/min): 18 Body Mass Index (BMI): 23.6 Blood Pressure (mmHg): 139/79 Reference Range: 80 - 120 mg / dl Electronic Signature(s) Signed: 06/04/2016 5:53:50 PM By: Montey Hora Entered By: Montey Hora on 06/04/2016 12:58:24

## 2016-06-09 ENCOUNTER — Encounter: Payer: Self-pay | Admitting: Internal Medicine

## 2016-06-09 ENCOUNTER — Encounter: Payer: Self-pay | Admitting: *Deleted

## 2016-06-09 ENCOUNTER — Inpatient Hospital Stay: Payer: PPO | Attending: Internal Medicine | Admitting: Internal Medicine

## 2016-06-09 DIAGNOSIS — Z85828 Personal history of other malignant neoplasm of skin: Secondary | ICD-10-CM | POA: Diagnosis not present

## 2016-06-09 DIAGNOSIS — Z9884 Bariatric surgery status: Secondary | ICD-10-CM | POA: Diagnosis not present

## 2016-06-09 DIAGNOSIS — Z79899 Other long term (current) drug therapy: Secondary | ICD-10-CM | POA: Diagnosis not present

## 2016-06-09 DIAGNOSIS — R0602 Shortness of breath: Secondary | ICD-10-CM | POA: Insufficient documentation

## 2016-06-09 DIAGNOSIS — H3530 Unspecified macular degeneration: Secondary | ICD-10-CM | POA: Insufficient documentation

## 2016-06-09 DIAGNOSIS — Z803 Family history of malignant neoplasm of breast: Secondary | ICD-10-CM | POA: Insufficient documentation

## 2016-06-09 DIAGNOSIS — K219 Gastro-esophageal reflux disease without esophagitis: Secondary | ICD-10-CM | POA: Diagnosis not present

## 2016-06-09 DIAGNOSIS — N183 Chronic kidney disease, stage 3 (moderate): Secondary | ICD-10-CM | POA: Diagnosis not present

## 2016-06-09 DIAGNOSIS — M810 Age-related osteoporosis without current pathological fracture: Secondary | ICD-10-CM | POA: Diagnosis not present

## 2016-06-09 DIAGNOSIS — Z7982 Long term (current) use of aspirin: Secondary | ICD-10-CM | POA: Insufficient documentation

## 2016-06-09 DIAGNOSIS — Z801 Family history of malignant neoplasm of trachea, bronchus and lung: Secondary | ICD-10-CM | POA: Insufficient documentation

## 2016-06-09 DIAGNOSIS — R5383 Other fatigue: Secondary | ICD-10-CM | POA: Diagnosis not present

## 2016-06-09 DIAGNOSIS — D509 Iron deficiency anemia, unspecified: Secondary | ICD-10-CM | POA: Insufficient documentation

## 2016-06-09 DIAGNOSIS — Z800 Family history of malignant neoplasm of digestive organs: Secondary | ICD-10-CM | POA: Diagnosis not present

## 2016-06-09 DIAGNOSIS — D500 Iron deficiency anemia secondary to blood loss (chronic): Secondary | ICD-10-CM | POA: Diagnosis not present

## 2016-06-09 DIAGNOSIS — F1020 Alcohol dependence, uncomplicated: Secondary | ICD-10-CM | POA: Diagnosis not present

## 2016-06-09 DIAGNOSIS — M1990 Unspecified osteoarthritis, unspecified site: Secondary | ICD-10-CM | POA: Insufficient documentation

## 2016-06-09 DIAGNOSIS — Z87891 Personal history of nicotine dependence: Secondary | ICD-10-CM | POA: Diagnosis not present

## 2016-06-09 NOTE — Progress Notes (Signed)
[place] NOTE  Patient Care Team: Crecencio Mc, MD as PCP - General (Internal Medicine)  CHIEF COMPLAINTS/PURPOSE OF CONSULTATION:   # IRON DEFICIENCY ANEMIA [sep 2017-Dr.Elliot-EGD/[facility]-Pending]  # "Gastric Bypass"  No history exists.     HISTORY OF PRESENTING ILLNESS:  Meagan Zavala 70 y.o.  female long-standing history of anemia was evaluated by GI yesterday- 4 anemia.   Patient has poor by mouth tolerance to iron. Patient denies any gross blood in stools. She complains of black stools. Stool occult was positive. She complains of shortness of breath patient complains of fatigue. No swelling in the legs.   Patient had iron studies that showed a ferritin of 7/hemoglobin 7.8. Saturation 4%. Had colonoscopy many years ago. No weight loss. No difficulty swallowing. No blood in urine. No vaginal bleeding.  ROS: A complete 10 point review of system is done which is negative except mentioned above in history of present illness  MEDICAL HISTORY:  Past Medical History:  Diagnosis Date  . Anemia   . Arthritis   . Basal cell carcinoma of face   . BULIMIA many decades of this   patient keeps alive by drinking ensure that she cannot purge  . GERD (gastroesophageal reflux disease)   . Hypercholesteremia   . KIDNEY DISEASE, CHRONIC, STAGE III 2010   Cr 1.3 -> .84, probably from NSAID's and lasix  . LEAKAGE, CONTINUOUS URINE 07/30/2009  . LOW BACK PAIN, CHRONIC    takes tylenol  . Macular degeneration disease    Dingledein, now Appenzeller  . Major depressive disorder, recurrent episode, severe ([place])    well treated with SSRI's  . OSTEOPOROSIS    severe, treated with infusions, followed by endocrine in [place]  . Wears dentures    full upper and lower    SURGICAL HISTORY: Past Surgical History:  Procedure Laterality Date  . CARPAL TUNNEL RELEASE    . CATARACT EXTRACTION    . COLON SURGERY  1980   partial colectomy  . ECTROPION REPAIR Bilateral  08/13/2015   Procedure: REPAIR OF ECTROPION;  Surgeon: Karle Starch, MD;  Location: [facility];  Service: Ophthalmology;  Laterality: Bilateral;  . 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 PTOSIS REPAIR Bilateral 08/13/2015   Procedure: PTOSIS REPAIR;  Surgeon: Karle Starch, MD;  Location: [facility];  Service: Ophthalmology;  Laterality: Bilateral;  . SURGICAL EXCISION OF EXCESSIVE SKIN  1979  . TUBAL LIGATION      SOCIAL HISTORY: Social History   Social History  . Marital status: Single    Spouse name: N/A  . Number of children: N/A  . Years of education: N/A   Occupational History  . Not on file.   Social History Main Topics  . Smoking status: Former Smoker    Quit date: 10/02/2010  . Smokeless tobacco: Never Used  . 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 Drug use: No  . Sexual activity: Not on file   Other Topics Concern  . Not on file   Social History Narrative   Patient has been living alone. She is on disability. She goes to church and has support there. She has a very supportive cousin, Adylin Piske: [phone].    FAMILY HISTORY: Family History  Problem Relation Age of Onset  . Cancer Mother     breast,   . Cancer Father  lung  . Cancer Sister     lung, brain  . Cancer Brother     throat    ALLERGIES:  has No Known Allergies.  MEDICATIONS:  Current Outpatient Prescriptions  Medication Sig Dispense Refill  . aspirin 81 MG tablet Take 1 tablet (81 mg total) by mouth daily. 30 tablet 11  . calcium citrate-vitamin D 200-200 MG-UNIT TABS Take 3 tablets by mouth daily.     . clonazePAM (KLONOPIN) 0.5 MG tablet 0.25 mg 2 (two) times daily as needed.   0  . docusate sodium (COLACE) 100 MG capsule Take 100 mg by mouth 2 (two) times daily.    Marland Kitchen donepezil (ARICEPT) 10 MG tablet Take 10 mg by mouth.    . fish oil-omega-3 fatty acids 1000 MG capsule Take 1 capsule by mouth daily.     Marland Kitchen FLUoxetine HCl 60 MG TABS TAKE ONE TABLET BY MOUTH ONCE DAILY 90 tablet 1  . HYDROcodone-acetaminophen (NORCO) 10-325 MG tablet Take 1 tablet by mouth 2 (two) times daily. As needed for severe pain 60 tablet 0  . mirtazapine (REMERON) 15 MG tablet Take 1 tablet (15 mg total) by mouth at bedtime. 90 tablet 1  . Multiple Vitamins-Minerals (ICAPS PO) Take by mouth.    Marland Kitchen omeprazole (PRILOSEC) 40 MG capsule TAKE ONE CAPSULE BY MOUTH ONCE DAILY 90 capsule 2  . oxybutynin (DITROPAN XL) 15 MG 24 hr tablet Take 1 tablet (15 mg total) by mouth at bedtime. 90 tablet 2  . pravastatin (PRAVACHOL) 20 MG tablet TAKE ONE TABLET BY MOUTH ONCE DAILY 90 tablet 3  . SYNTHROID 25 MCG tablet Take 25 mcg by mouth daily before breakfast.     . traZODone (DESYREL) 50 MG tablet TAKE ONE TO TWO TABLETS BY MOUTH ONCE DAILY 180 tablet 0  . Vitamin D, Ergocalciferol, (DRISDOL) 50000 UNITS CAPS Take 50,000 Units by mouth every 14 (fourteen) days.    . vitamin E 400 UNIT capsule Take 400 Units by mouth daily. Reported on 04/07/2016    . zoledronic acid (RECLAST) 5 MG/100ML SOLN Every year.    . Cyanocobalamin (RA VITAMIN B-12 TR) 1000 MCG TBCR Take by mouth.     No current facility-administered medications for this visit.       Marland Kitchen  PHYSICAL EXAMINATION: ECOG PERFORMANCE STATUS: 1 - Symptomatic but completely ambulatory  Vitals:   06/09/16 1413  BP: 94/61  Pulse: 71  Resp: 16  Temp: 97 F (36.1 C)   Filed Weights   06/09/16 1413  Weight: 127 lb 5.1 oz (57.7 kg)    GENERAL: Well-nourished well-developed; Alert, no distress and comfortable.  Alone.  EYES: no pallor or icterus OROPHARYNX: no thrush or ulceration; good dentition  NECK: supple, no masses felt LYMPH:  no palpable lymphadenopathy in the cervical, axillary or inguinal regions LUNGS: clear to auscultation and  No wheeze or crackles HEART/CVS: regular rate & rhythm and no murmurs; No lower extremity edema ABDOMEN: abdomen soft, non-tender and  normal bowel sounds Musculoskeletal:no cyanosis of digits and no clubbing  PSYCH: alert & oriented x 3 with fluent speech NEURO: no focal motor/sensory deficits SKIN:  no rashes or significant lesions  LABORATORY DATA:  I have reviewed the data as listed Lab Results  Component Value Date   WBC 4.6 05/19/2016   HGB 8.6 aL (L) 05/19/2016   HCT 26.7 aL (L) 05/19/2016   MCV 85.7 05/19/2016   PLT 240.0 05/19/2016    Recent Labs  08/20/15 1758 10/01/15 1338 10/15/15  1645  NA 134* 140 135  K 4.6 4.0 4.2  CL 103 102 101  CO2 25 31 28   GLUCOSE 100* 88 103*  BUN 29* 20 25*  CREATININE 0.84 0.86 0.69  CALCIUM 9.5 9.8 9.3  GFRNONAA >60  --  >60  GFRAA >60  --  >60  PROT  --  6.7  --   ALBUMIN  --  4.0  --   AST  --  19  --   ALT  --  18  --   ALKPHOS  --  75  --   BILITOT  --  0.4  --     RADIOGRAPHIC STUDIES: I have personally reviewed the radiological images as listed and agreed with the findings in the report. No results found.  ASSESSMENT & PLAN:   Iron deficiency anemia due to chronic blood loss Patient has iron deficiency anemia with a hemoglobin of 7.8 ferritin of 7. Unclear etiology. Awaiting EGD colonoscopy tomorrow morning.  # Recommend IV Venofer weekly 4; checked weekly CBC to make sure she does not drop her hemoglobin in the further. She'll probably need blood transfusion if her hemoglobin drops further/or she gets very symptomatic from anemia.  # Recommend follow-up with me in approximately 4 weeks with iron studies/ferritin.  Thank you Dr.Tullo for allowing me to participate in the care of your pleasant patient. Please do not hesitate to contact me with questions or concerns in the interim.  All questions were answered. The patient knows to call the clinic with any problems, questions or concerns.   Cammie Sickle, MD 06/09/2016 2:53 PM

## 2016-06-09 NOTE — Assessment & Plan Note (Addendum)
Patient has iron deficiency anemia with a hemoglobin of 7.8 ferritin of 7. Unclear etiology. Awaiting EGD colonoscopy tomorrow morning.  # Recommend IV Venofer weekly 4; checked weekly CBC to make sure she does not drop her hemoglobin in the further. She'll probably need blood transfusion if her hemoglobin drops further/or she gets very symptomatic from anemia.  # Recommend follow-up with me in approximately 4 weeks with iron studies/ferritin.  # Recommend holding off going to vacation next week/especially if the hemoglobin is 7.8/GI workup in progress.  Thank you Dr.Tullo for allowing me to participate in the care of your pleasant patient. Please do not hesitate to contact me with questions or concerns in the interim.

## 2016-06-09 NOTE — Progress Notes (Signed)
Pt reports "ulcer" at umbilicus area.  Seeing wound center.  Fatigue.  Seeing GI for upper and lower for hx blood in stool.

## 2016-06-10 ENCOUNTER — Encounter: Payer: Self-pay | Admitting: *Deleted

## 2016-06-10 ENCOUNTER — Ambulatory Visit: Payer: PPO | Admitting: Anesthesiology

## 2016-06-10 ENCOUNTER — Encounter
Admission: RE | Disposition: A | Discharge status: Home / Self Care | Payer: Self-pay | Source: Ambulatory Visit | Attending: Unknown Physician Specialty

## 2016-06-10 ENCOUNTER — Ambulatory Visit
Admission: RE | Admit: 2016-06-10 | Discharge: 2016-06-10 | Disposition: A | Discharge status: Home / Self Care | Payer: PPO | Source: Ambulatory Visit | Attending: Unknown Physician Specialty | Admitting: Unknown Physician Specialty

## 2016-06-10 DIAGNOSIS — Z79899 Other long term (current) drug therapy: Secondary | ICD-10-CM | POA: Insufficient documentation

## 2016-06-10 DIAGNOSIS — K219 Gastro-esophageal reflux disease without esophagitis: Secondary | ICD-10-CM | POA: Insufficient documentation

## 2016-06-10 DIAGNOSIS — F339 Major depressive disorder, recurrent, unspecified: Secondary | ICD-10-CM | POA: Diagnosis not present

## 2016-06-10 DIAGNOSIS — M810 Age-related osteoporosis without current pathological fracture: Secondary | ICD-10-CM | POA: Insufficient documentation

## 2016-06-10 DIAGNOSIS — E7800 Pure hypercholesterolemia, unspecified: Secondary | ICD-10-CM | POA: Diagnosis not present

## 2016-06-10 DIAGNOSIS — Z7983 Long term (current) use of bisphosphonates: Secondary | ICD-10-CM | POA: Insufficient documentation

## 2016-06-10 DIAGNOSIS — Z7982 Long term (current) use of aspirin: Secondary | ICD-10-CM | POA: Diagnosis not present

## 2016-06-10 DIAGNOSIS — Z9884 Bariatric surgery status: Secondary | ICD-10-CM | POA: Insufficient documentation

## 2016-06-10 DIAGNOSIS — Z87891 Personal history of nicotine dependence: Secondary | ICD-10-CM | POA: Diagnosis not present

## 2016-06-10 DIAGNOSIS — K449 Diaphragmatic hernia without obstruction or gangrene: Secondary | ICD-10-CM | POA: Diagnosis not present

## 2016-06-10 DIAGNOSIS — N183 Chronic kidney disease, stage 3 (moderate): Secondary | ICD-10-CM | POA: Diagnosis not present

## 2016-06-10 DIAGNOSIS — K640 First degree hemorrhoids: Secondary | ICD-10-CM | POA: Insufficient documentation

## 2016-06-10 DIAGNOSIS — F502 Bulimia nervosa: Secondary | ICD-10-CM | POA: Diagnosis not present

## 2016-06-10 DIAGNOSIS — D509 Iron deficiency anemia, unspecified: Secondary | ICD-10-CM | POA: Insufficient documentation

## 2016-06-10 DIAGNOSIS — E039 Hypothyroidism, unspecified: Secondary | ICD-10-CM | POA: Diagnosis not present

## 2016-06-10 DIAGNOSIS — N3945 Continuous leakage: Secondary | ICD-10-CM | POA: Diagnosis not present

## 2016-06-10 HISTORY — PX: COLONOSCOPY WITH PROPOFOL: SHX5780

## 2016-06-10 HISTORY — PX: ESOPHAGOGASTRODUODENOSCOPY (EGD) WITH PROPOFOL: SHX5813

## 2016-06-10 SURGERY — COLONOSCOPY WITH PROPOFOL
Anesthesia: General

## 2016-06-10 MED ORDER — SODIUM CHLORIDE 0.9 % IV SOLN
INTRAVENOUS | Status: DC
  Administered 2016-06-10: 1000 mL via INTRAVENOUS

## 2016-06-10 MED ORDER — FENTANYL CITRATE (PF) 100 MCG/2ML IJ SOLN
INTRAMUSCULAR | Status: DC | PRN
  Administered 2016-06-10: 50 ug via INTRAVENOUS

## 2016-06-10 MED ORDER — MIDAZOLAM HCL 5 MG/5ML IJ SOLN
INTRAMUSCULAR | Status: DC | PRN
  Administered 2016-06-10: 1 mg via INTRAVENOUS

## 2016-06-10 MED ORDER — PHENYLEPHRINE HCL 10 MG/ML IJ SOLN
INTRAMUSCULAR | Status: DC | PRN
  Administered 2016-06-10: 50 ug via INTRAVENOUS

## 2016-06-10 MED ORDER — PROPOFOL 10 MG/ML IV BOLUS
INTRAVENOUS | Status: DC | PRN
  Administered 2016-06-10: 10 mg via INTRAVENOUS
  Administered 2016-06-10 (×3): 20 mg via INTRAVENOUS
  Administered 2016-06-10: 10 mg via INTRAVENOUS
  Administered 2016-06-10: 20 mg via INTRAVENOUS
  Administered 2016-06-10: 10 mg via INTRAVENOUS
  Administered 2016-06-10: 20 mg via INTRAVENOUS

## 2016-06-10 MED ORDER — LIDOCAINE HCL (PF) 2 % IJ SOLN
INTRAMUSCULAR | Status: DC | PRN
  Administered 2016-06-10: 50 mg

## 2016-06-10 MED ORDER — EPHEDRINE SULFATE 50 MG/ML IJ SOLN
INTRAMUSCULAR | Status: DC | PRN
  Administered 2016-06-10: 10 mg via INTRAVENOUS
  Administered 2016-06-10: 5 mg via INTRAVENOUS
  Administered 2016-06-10: 10 mg via INTRAVENOUS

## 2016-06-10 MED ORDER — SODIUM CHLORIDE 0.9 % IV SOLN: INTRAVENOUS | Status: DC

## 2016-06-10 MED ORDER — PROPOFOL 500 MG/50ML IV EMUL
INTRAVENOUS | Status: DC | PRN
  Administered 2016-06-10: 50 ug/kg/min via INTRAVENOUS

## 2016-06-10 NOTE — Anesthesia Preprocedure Evaluation (Signed)
Anesthesia Evaluation  Patient identified by MRN, date of birth, ID band Patient awake    Reviewed: Allergy & Precautions, NPO status , Patient's Chart, lab work & pertinent test results  History of Anesthesia Complications Negative for: history of anesthetic complications  Airway Mallampati: II       Dental  (+) Upper Dentures, Lower Dentures   Pulmonary COPD, former smoker,    breath sounds clear to auscultation       Cardiovascular Exercise Tolerance: Poor  Rhythm:Regular Rate:Normal     Neuro/Psych Depression    GI/Hepatic Neg liver ROS, GERD  ,  Endo/Other  Hypothyroidism   Renal/GU      Musculoskeletal   Abdominal Normal abdominal exam  (+)   Peds  Hematology  (+) anemia ,   Anesthesia Other Findings   Reproductive/Obstetrics                             Anesthesia Physical Anesthesia Plan  ASA: III  Anesthesia Plan: General   Post-op Pain Management:    Induction: Intravenous  Airway Management Planned: Natural Airway and Nasal Cannula  Additional Equipment:   Intra-op Plan:   Post-operative Plan:   Informed Consent: I have reviewed the patients History and Physical, chart, labs and discussed the procedure including the risks, benefits and alternatives for the proposed anesthesia with the patient or authorized representative who has indicated his/her understanding and acceptance.     Plan Discussed with: CRNA  Anesthesia Plan Comments:         Anesthesia Quick Evaluation

## 2016-06-10 NOTE — H&P (Signed)
Primary Care Physician:  Crecencio Mc, MD Primary Gastroenterologist:  Dr. Vira Agar  Pre-Procedure History & Physical: HPI:  Meagan Zavala is a 70 y.o. female is here for an endoscopy and colonoscopy.   Past Medical History:  Diagnosis Date  . Anemia   . Arthritis   . Basal cell carcinoma of face   . BULIMIA many decades of this   patient keeps alive by drinking ensure that she cannot purge  . GERD (gastroesophageal reflux disease)   . Hypercholesteremia   . KIDNEY DISEASE, CHRONIC, STAGE III 2010   Cr 1.3 -> .84, probably from NSAID's and lasix  . LEAKAGE, CONTINUOUS URINE 07/30/2009  . LOW BACK PAIN, CHRONIC    takes tylenol  . Macular degeneration disease    Dingledein, now Appenzeller  . Major depressive disorder, recurrent episode, severe ([place])    well treated with SSRI's  . OSTEOPOROSIS    severe, treated with infusions, followed by endocrine in [place]  . Wears dentures    full upper and lower    Past Surgical History:  Procedure Laterality Date  . CARPAL TUNNEL RELEASE    . CATARACT EXTRACTION    . COLON SURGERY  1980   partial colectomy  . ECTROPION REPAIR Bilateral 08/13/2015   Procedure: REPAIR OF ECTROPION;  Surgeon: Karle Starch, MD;  Location: [facility];  Service: Ophthalmology;  Laterality: Bilateral;  . 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 PTOSIS REPAIR Bilateral 08/13/2015   Procedure: PTOSIS REPAIR;  Surgeon: Karle Starch, MD;  Location: [facility];  Service: Ophthalmology;  Laterality: Bilateral;  . SURGICAL EXCISION OF EXCESSIVE SKIN  1979  . TUBAL LIGATION      Prior to Admission medications   Medication Sig Start Date End Date Taking? Authorizing Provider  aspirin 81 MG tablet Take 1 tablet (81 mg total) by mouth daily. 12/05/13  Yes Crecencio Mc, MD  calcium citrate-vitamin D 200-200 MG-UNIT TABS Take 3 tablets by mouth daily.    Yes Historical Provider, MD  clonazePAM (KLONOPIN) 0.5 MG tablet  0.25 mg 2 (two) times daily as needed.  11/18/14  Yes Historical Provider, MD  Cyanocobalamin (RA VITAMIN B-12 TR) 1000 MCG TBCR Take by mouth.   Yes Historical Provider, MD  docusate sodium (COLACE) 100 MG capsule Take 100 mg by mouth 2 (two) times daily.   Yes Historical Provider, MD  donepezil (ARICEPT) 10 MG tablet Take 10 mg by mouth. 05/12/16  Yes Historical Provider, MD  fish oil-omega-3 fatty acids 1000 MG capsule Take 1 capsule by mouth daily.   Yes Historical Provider, MD  FLUoxetine HCl 60 MG TABS TAKE ONE TABLET BY MOUTH ONCE DAILY 05/25/16  Yes Crecencio Mc, MD  mirtazapine (REMERON) 15 MG tablet Take 1 tablet (15 mg total) by mouth at bedtime. 11/25/15  Yes Crecencio Mc, MD  Multiple Vitamins-Minerals (ICAPS PO) Take by mouth.   Yes Historical Provider, MD  omeprazole (PRILOSEC) 40 MG capsule TAKE ONE CAPSULE BY MOUTH ONCE DAILY 09/11/15  Yes Crecencio Mc, MD  oxybutynin (DITROPAN XL) 15 MG 24 hr tablet Take 1 tablet (15 mg total) by mouth at bedtime. 04/07/16  Yes Crecencio Mc, MD  pravastatin (PRAVACHOL) 20 MG tablet TAKE ONE TABLET BY MOUTH ONCE DAILY 11/19/15  Yes Crecencio Mc, MD  SYNTHROID 25 MCG tablet Take 25 mcg by mouth daily before breakfast.  03/30/16  Yes Historical Provider, MD  traZODone (DESYREL) 50 MG tablet TAKE ONE TO TWO TABLETS BY MOUTH ONCE DAILY 06/20/15  Yes Crecencio Mc, MD  Vitamin D, Ergocalciferol, (DRISDOL) 50000 UNITS CAPS Take 50,000 Units by mouth every 14 (fourteen) days.   Yes Historical Provider, MD  vitamin E 400 UNIT capsule Take 400 Units by mouth daily. Reported on 04/07/2016   Yes Historical Provider, MD  zoledronic acid (RECLAST) 5 MG/100ML SOLN Every year.   Yes Historical Provider, MD  HYDROcodone-acetaminophen (NORCO) 10-325 MG tablet Take 1 tablet by mouth 2 (two) times daily. As needed for severe pain Patient not taking: Reported on 06/10/2016 01/07/16   Crecencio Mc, MD    Allergies as of 06/09/2016  . (No Known Allergies)     Family History  Problem Relation Age of Onset  . Cancer Mother     breast,   . Cancer Father     lung  . Cancer Sister     lung, brain  . Cancer Brother     throat    Social History   Social History  . Marital status: Single    Spouse name: N/A  . Number of children: N/A  . Years of education: N/A   Occupational History  . Not on file.   Social History Main Topics  . Smoking status: Former Smoker    Quit date: 10/02/2010  . Smokeless tobacco: Never Used  . 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 Drug use: No  . Sexual activity: Not on file   Other Topics Concern  . Not on file   Social History Narrative   Patient has been living alone. She is on disability. She goes to church and has support there. She has a very supportive cousin, Maraina Grosz: [phone].    Review of Systems: See HPI, otherwise negative ROS  Physical Exam: BP 103/60   Pulse 89   Temp 97.8 F (36.6 C) (Oral)   Resp 16   Ht 5\' 2"  (1.575 m)   Wt 55.3 kg (122 lb)   SpO2 94%   BMI 22.31 kg/m  General:   Alert,  pleasant and cooperative in NAD Head:  Normocephalic and atraumatic. Neck:  Supple; no masses or thyromegaly. Lungs:  Clear throughout to auscultation.    Heart:  Regular rate and rhythm. Abdomen:  Soft, nontender and nondistended. Normal bowel sounds, without guarding, and without rebound.   Neurologic:  Alert and  oriented x4;  grossly normal neurologically.  Impression/Plan: ZAMYRA RISK is here for an endoscopy and colonoscopy to be performed for heme positive stool, iron def anemia  Risks, benefits, limitations, and alternatives regarding  endoscopy and colonoscopy have been reviewed with the patient.  Questions have been answered.  All parties agreeable.   Gaylyn Cheers, MD  06/10/2016, 2:06 PM

## 2016-06-10 NOTE — Op Note (Signed)
[facility] Gastroenterology Patient Name: Meagan Zavala Procedure Date: 06/10/2016 2:09 PM MRN: [id number] Account #: [id number] Date of Birth: 11/06/1945 Admit Type: Outpatient Age: 70 Room: [facility] ENDO ROOM 4 Gender: Female Note Status: Finalized Procedure:            Upper GI endoscopy Indications:          Iron deficiency anemia Providers:            Manya Silvas, MD Referring MD:         Deborra Medina, MD (Referring MD) Medicines:            Propofol per Anesthesia Complications:        No immediate complications. Procedure:            Pre-Anesthesia Assessment:                       - After reviewing the risks and benefits, the patient                        was deemed in satisfactory condition to undergo the                        procedure.                       After obtaining informed consent, the endoscope was                        passed under direct vision. Throughout the procedure,                        the patient's blood pressure, pulse, and oxygen                        saturations were monitored continuously. The Endoscope                        was introduced through the mouth, and advanced to the                        afferent jejunal loop. The upper GI endoscopy was                        accomplished without difficulty. The patient tolerated                        the procedure well. Findings:      The examined esophagus was normal. Small hiatal hernia.      Evidence of a gastric bypass was found. A gastric pouch was found. The       duodenum-to-jejunum limb was examined. Looked good, mucosa normal.       Afferent and efferent loops looked good. No ulcers, no blood.       Anastamosis looked great. Impression:           - Normal esophagus.                       - Gastric bypass.                       - No specimens collected. Recommendation:       -  Perform a colonoscopy as previously scheduled. Manya Silvas, MD 06/10/2016  2:23:35 PM This report has been signed electronically. Number of Addenda: 0 Note Initiated On: 06/10/2016 2:09 PM      [facility]

## 2016-06-10 NOTE — Op Note (Signed)
[facility] Gastroenterology Patient Name: Meagan Zavala Procedure Date: 06/10/2016 2:09 PM MRN: [id number] Account #: [id number] Date of Birth: 02-02-1946 Admit Type: Outpatient Age: 71 Room: [facility] ENDO ROOM 4 Gender: Female Note Status: Finalized Procedure:            Colonoscopy Indications:          Iron deficiency anemia, Unexplained iron deficiency                        anemia Providers:            Manya Silvas, MD Referring MD:         Deborra Medina, MD (Referring MD) Medicines:            Propofol per Anesthesia Complications:        No immediate complications. Procedure:            Pre-Anesthesia Assessment:                       - After reviewing the risks and benefits, the patient                        was deemed in satisfactory condition to undergo the                        procedure.                       After obtaining informed consent, the colonoscope was                        passed under direct vision. Throughout the procedure,                        the patient's blood pressure, pulse, and oxygen                        saturations were monitored continuously. The                        Colonoscope was introduced through the anus and                        advanced to the the cecum, identified by appendiceal                        orifice and ileocecal valve. The colonoscopy was                        performed without difficulty. The patient tolerated the                        procedure well. The quality of the bowel preparation                        was adequate to identify polyps 6 mm and larger in size. Findings:      Internal hemorrhoids were found during endoscopy. The hemorrhoids were       small and Grade I (internal hemorrhoids that do not prolapse).      Normal mucosa was found in the entire colon. No blood, no polyps or  tumors or colitis seen. Some patches had stool usually in discreat small       turds and lavage was  done to see most of the colon but small areas of       possible source of bleeding could not be excluded. Distal ileum entered       and was normal. Impression:           - Internal hemorrhoids.                       - Normal mucosa in the entire examined colon.                       - No specimens collected. Recommendation:       - The findings and recommendations were discussed with                        the patient's family. She needs iv iron since she                        cannot absorb it well given previous gastric bypass. Manya Silvas, MD 06/10/2016 2:55:41 PM This report has been signed electronically. Number of Addenda: 0 Note Initiated On: 06/10/2016 2:09 PM Scope Withdrawal Time: 0 hours 11 minutes 5 seconds  Total Procedure Duration: 0 hours 17 minutes 29 seconds       [facility]

## 2016-06-10 NOTE — Transfer of Care (Signed)
Immediate Anesthesia Transfer of Care Note  Patient: Meagan Zavala  Procedure(s) Performed: Procedure(s): COLONOSCOPY WITH PROPOFOL (N/A) ESOPHAGOGASTRODUODENOSCOPY (EGD) WITH PROPOFOL (N/A)  Patient Location: PACU  Anesthesia Type:General  Level of Consciousness: sedated  Airway & Oxygen Therapy: Patient Spontanous Breathing and Patient connected to nasal cannula oxygen  Post-op Assessment: Report given to RN and Post -op Vital signs reviewed and stable  Post vital signs: Reviewed and stable  Last Vitals:  Vitals:   06/10/16 1326  BP: 103/60  Pulse: 89  Resp: 16  Temp: 36.6 C    Last Pain:  Vitals:   06/10/16 1326  TempSrc: Oral         Complications: No apparent anesthesia complications

## 2016-06-11 ENCOUNTER — Encounter: Payer: Self-pay | Admitting: Unknown Physician Specialty

## 2016-06-11 ENCOUNTER — Ambulatory Visit: Payer: PPO

## 2016-06-11 ENCOUNTER — Encounter: Payer: PPO | Admitting: Surgery

## 2016-06-11 DIAGNOSIS — S31105A Unspecified open wound of abdominal wall, periumbilic region without penetration into peritoneal cavity, initial encounter: Secondary | ICD-10-CM | POA: Diagnosis not present

## 2016-06-11 LAB — HM COLONOSCOPY

## 2016-06-11 NOTE — Anesthesia Postprocedure Evaluation (Signed)
Anesthesia Post Note  Patient: Meagan Zavala  Procedure(s) Performed: Procedure(s) (LRB): COLONOSCOPY WITH PROPOFOL (N/A) ESOPHAGOGASTRODUODENOSCOPY (EGD) WITH PROPOFOL (N/A)  Patient location during evaluation: PACU Anesthesia Type: General Level of consciousness: awake Pain management: pain level controlled Vital Signs Assessment: post-procedure vital signs reviewed and stable Respiratory status: spontaneous breathing Cardiovascular status: stable Anesthetic complications: no    Last Vitals:  Vitals:   06/10/16 1508 06/10/16 1518  BP: (!) 86/58 (!) 92/49  Pulse: 77 74  Resp: 20 16  Temp:      Last Pain:  Vitals:   06/10/16 1448  TempSrc: Tympanic                 VAN STAVEREN,Stoney Karczewski

## 2016-06-12 ENCOUNTER — Inpatient Hospital Stay: Payer: PPO

## 2016-06-12 ENCOUNTER — Other Ambulatory Visit: Payer: Self-pay

## 2016-06-12 VITALS — BP 94/62 | HR 68 | Temp 97.1°F | Resp 16

## 2016-06-12 DIAGNOSIS — D500 Iron deficiency anemia secondary to blood loss (chronic): Secondary | ICD-10-CM

## 2016-06-12 LAB — CBC WITH DIFFERENTIAL/PLATELET
Basophils Absolute: 0 10*3/uL (ref 0–0.1)
Basophils Relative: 1 %
EOS PCT: 2 %
Eosinophils Absolute: 0.1 10*3/uL (ref 0–0.7)
HEMATOCRIT: 24.7 % — AB (ref 35.0–47.0)
Hemoglobin: 8 g/dL — ABNORMAL LOW (ref 12.0–16.0)
LYMPHS ABS: 1.1 10*3/uL (ref 1.0–3.6)
LYMPHS PCT: 24 %
MCH: 26 pg (ref 26.0–34.0)
MCHC: 32.2 g/dL (ref 32.0–36.0)
MCV: 80.6 fL (ref 80.0–100.0)
MONO ABS: 0.3 10*3/uL (ref 0.2–0.9)
MONOS PCT: 7 %
NEUTROS ABS: 3.2 10*3/uL (ref 1.4–6.5)
Neutrophils Relative %: 66 %
Platelets: 225 10*3/uL (ref 150–440)
RBC: 3.06 MIL/uL — ABNORMAL LOW (ref 3.80–5.20)
RDW: 15.7 % — AB (ref 11.5–14.5)
WBC: 4.8 10*3/uL (ref 3.6–11.0)

## 2016-06-12 MED ORDER — SODIUM CHLORIDE 0.9 % IV SOLN
200.0000 mg | Freq: Once | INTRAVENOUS | Status: AC
  Administered 2016-06-12: 200 mg via INTRAVENOUS
  Filled 2016-06-12: qty 10

## 2016-06-12 MED ORDER — SODIUM CHLORIDE 0.9 % IV SOLN
Freq: Once | INTRAVENOUS | Status: AC
  Administered 2016-06-12: 15:00:00 via INTRAVENOUS
  Filled 2016-06-12: qty 1000

## 2016-06-12 NOTE — Progress Notes (Signed)
Meagan, Zavala ([id number]) Visit Report for 06/11/2016 Chief Complaint Document Details Patient Name: Meagan Zavala, Meagan Zavala. Date of Service: 06/11/2016 1:30 PM Medical Record Number: [id number] Patient Account Number: [id number] Date of Birth/Sex: [birth year] (70 y.o. Female) Treating RN: Montey Hora Primary Care Physician: Deborra Medina Other Clinician: Referring Physician: Deborra Medina Treating Physician/Extender: Frann Rider in Treatment: 8 Information Obtained from: Patient Chief Complaint Patient seen for complaints of Non-Healing Wound to the right of from licorice on her abdominal wall for about a month Electronic Signature(s) Signed: 06/11/2016 2:36:37 PM By: Christin Fudge MD, FACS Entered By: Christin Fudge on 06/11/2016 14:36:36 Meagan Zavala. ([id number]) -------------------------------------------------------------------------------- HPI Details Patient Name: Meagan Zavala. Date of Service: 06/11/2016 1:30 PM Medical Record Number: [id number] Patient Account Number: [id number] Date of Birth/Sex: 07-Apr-1946 (70 y.o. Female) Treating RN: Montey Hora Primary Care Physician: Deborra Medina Other Clinician: Referring Physician: Deborra Medina Treating Physician/Extender: Frann Rider in Treatment: 8 History of Present Illness Location: umbilical region of the abdominal wall Quality: an open ulcerated area some amount of pain and burning Severity: Patient states wound are getting worse. Duration: Patient has had the wound for < 4 weeks prior to presenting for treatment Timing: Pain in wound is Intermittent (comes and goes Context: The wound appeared gradually over time Modifying Factors: Other treatment(s) tried include:and doxycycline the present time Associated Signs and Symptoms: Patient reports having increase discharge. HPI Description: 70 year old patient who was recently seen by Dr. Tommi Rumps was noted to have a small ulceration on abdomen  wall. the patient has had it for about 6 weeks in the periumbilical areaThe patient noted that it has become slightly larger and draining purulent material.was started on doxycycline and given an appointment to the wound center. She does not recall if she had any insect bite or injury her past medical history significant for GERD, hypothyroidism, osteoporosis, chronic kidney disease stage III, chronic low back pain, anemia, vitamin D deficiency and urge incontinence. 7/27//2017 -- pathology report of specimen done -- DIAGNOSIS: A. SKIN, ABDOMEN; BIOPSY: - CHRONIC ULCER. - NEGATIVE FOR MALIGNANCY Electronic Signature(s) Signed: 06/11/2016 2:36:44 PM By: Christin Fudge MD, FACS Entered By: Christin Fudge on 06/11/2016 14:36:43 [name] ([id number]) -------------------------------------------------------------------------------- Physical Exam Details Patient Name: Meagan Zavala. Date of Service: 06/11/2016 1:30 PM Medical Record Number: [id number] Patient Account Number: [id number] Date of Birth/Sex: [birth year] (70 y.o. Female) Treating RN: Montey Hora Primary Care Physician: Deborra Medina Other Clinician: Referring Physician: Deborra Medina Treating Physician/Extender: Frann Rider in Treatment: 8 Constitutional . Pulse regular. Respirations normal and unlabored. Afebrile. . Eyes Nonicteric. Reactive to light. Ears, Nose, Mouth, and Throat Lips, teeth, and gums WNL.Marland Kitchen Moist mucosa without lesions. Neck supple and nontender. No palpable supraclavicular or cervical adenopathy. Normal sized without goiter. Respiratory WNL. No retractions.. Breath sounds WNL, No rubs, rales, rhonchi, or wheeze.. Cardiovascular Heart rhythm and rate regular, no murmur or gallop.. Pedal Pulses WNL. No clubbing, cyanosis or edema. Lymphatic No adneopathy. No adenopathy. No adenopathy. Musculoskeletal Adexa without tenderness or enlargement.. Digits and nails w/o clubbing, cyanosis,  infection, petechiae, ischemia, or inflammatory conditions.. Integumentary (Hair, Skin) No suspicious lesions. No crepitus or fluctuance. No peri-wound warmth or erythema. No masses.Marland Kitchen Psychiatric Judgement and insight Intact.. No evidence of depression, anxiety, or agitation.. Notes wound is looking much better today with healthy epithelialization and minimal open area. Electronic Signature(s) Signed: 06/11/2016 2:37:08 PM By: Christin Fudge MD, FACS Entered By: Christin Fudge on 06/11/2016 14:37:07 [name] ([id number]) -------------------------------------------------------------------------------- Physician Orders  Details Patient Name: Meagan, BUJALSKI Zavala. Date of Service: 06/11/2016 1:30 PM Medical Record Patient Account Number: [id number] Number: Afful, RN, BSN, Treating RN: March 08, 1946 [phone] y.o. Velva Harman Date of Birth/Sex: Female) Other Clinician: Primary Care Physician: Deborra Medina Treating Christin Fudge Referring Physician: Deborra Medina Physician/Extender: Suella Grove in Treatment: 8 Verbal / Phone Orders: Yes Clinician: Afful, RN, BSN, Rita Read Back and Verified: Yes Diagnosis Coding Wound Cleansing Wound #1 Right,Medial Abdomen - midline o Clean wound with Normal Saline. Anesthetic Wound #1 Right,Medial Abdomen - midline o Topical Lidocaine 4% cream applied to wound bed prior to debridement o Injected 2% Lidocaine without epinephrine prior to debridement Primary Wound Dressing Wound #1 Right,Medial Abdomen - midline o Prisma Ag Secondary Dressing Wound #1 Right,Medial Abdomen - midline o Dry Gauze o Boardered Foam Dressing Dressing Change Frequency Wound #1 Right,Medial Abdomen - midline o Change dressing every day. Follow-up Appointments Wound #1 Right,Medial Abdomen - midline o Return Appointment in 1 week. Additional Orders / Instructions Wound #1 Right,Medial Abdomen - midline o Increase protein intake. o Activity as  tolerated o Other: - Vitamin C, A, MVI, ZINC GRAYLEE, PARKE ([id number]) Electronic Signature(s) Signed: 06/11/2016 3:58:34 PM By: Christin Fudge MD, FACS Signed: 06/11/2016 5:04:09 PM By: Regan Lemming BSN, RN Entered By: Regan Lemming on 06/11/2016 14:02:02 [name] -------------------------------------------------------------------------------- Problem List Details Patient Name: Meagan Zavala. Date of Service: 06/11/2016 1:30 PM Medical Record Number: [id number] Patient Account Number: [id number] Date of Birth/Sex: Jan 08, 1946 (70 y.o. 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 Date of Service: 06/11/2016 1:30 PM Medical Record Number: [id number] Patient Account Number: [id number] Date of Birth/Sex: [birth year] (70 y.o. Female) Treating RN: Montey Hora Primary Care Physician: Deborra Medina Other Clinician: Referring Physician: Deborra Medina Treating Physician/Extender: Frann Rider in Treatment: 8 Subjective Chief Complaint Information obtained from Patient Patient seen for complaints of Non-Healing  Wound to the right of from licorice on her abdominal wall for about a month History of Present Illness (HPI) The following HPI elements were documented for the patient's wound: Location: umbilical region of the abdominal wall Quality: an open ulcerated area some amount of pain and burning Severity: Patient states wound are getting worse. Duration: Patient has had the wound for < 4 weeks prior to presenting for treatment Timing: Pain in wound is Intermittent (comes and goes Context: The wound appeared gradually over time Modifying Factors: Other treatment(s) tried include:and doxycycline the present time Associated Signs and Symptoms: Patient reports having increase discharge. 70 year old patient who was recently seen by Dr. Tommi Rumps was noted to have a small ulceration on abdomen wall. the patient has had it for about 6 weeks in the periumbilical areaThe patient noted that it has become slightly larger and draining purulent material.was started on doxycycline and given an appointment to the wound center. She does not recall if she had any insect bite or injury her past medical history significant  for GERD, hypothyroidism, osteoporosis, chronic kidney disease stage III, chronic low back pain, anemia, vitamin D deficiency and urge incontinence. 7/27//2017 -- pathology report of specimen done -- DIAGNOSIS: A. SKIN, ABDOMEN; BIOPSY: - CHRONIC ULCER. - NEGATIVE FOR MALIGNANCY Objective Constitutional Pulse regular. Respirations normal and unlabored. Afebrile. Vitals Time Taken: 1:43 PM, Height: 61 in, Weight: 125 lbs, BMI: 23.6, Pulse: 75 bpm, Respiratory Rate: 18 breaths/min, Blood Pressure: 100/40 mmHg. Meagan Zavala, Meagan Zavala. ([id number]) Eyes Nonicteric. Reactive to light. Ears, Nose, Mouth, and Throat Lips, teeth, and gums WNL.Marland Kitchen Moist mucosa without lesions. Neck supple and nontender. No palpable supraclavicular or cervical adenopathy. Normal sized without goiter. Respiratory WNL. No  retractions.. Breath sounds WNL, No rubs, rales, rhonchi, or wheeze.. Cardiovascular Heart rhythm and rate regular, no murmur or gallop.. Pedal Pulses WNL. No clubbing, cyanosis or edema. Lymphatic No adneopathy. No adenopathy. No adenopathy. Musculoskeletal Adexa without tenderness or enlargement.. Digits and nails w/o clubbing, cyanosis, infection, petechiae, ischemia, or inflammatory conditions.Marland Kitchen Psychiatric Judgement and insight Intact.. No evidence of depression, anxiety, or agitation.. General Notes: wound is looking much better today with healthy epithelialization and minimal open area. Integumentary (Hair, Skin) No suspicious lesions. No crepitus or fluctuance. No peri-wound warmth or erythema. No masses.. Wound #1 status is Open. Original cause of wound was Gradually Appeared. The wound is located on the Right,Medial Abdomen - midline. The wound measures 0.3cm length x 0.9cm width x 0.1cm depth; 0.212cm^2 area and 0.021cm^3 volume. The wound is limited to skin breakdown. There is no tunneling or undermining noted. There is a large amount of serous drainage noted. The wound margin is flat and intact. There is large (67-100%) pink granulation within the wound bed. There is a small (1-33%) amount of necrotic tissue within the wound bed including Adherent Slough. The periwound skin appearance exhibited: Moist. The periwound skin appearance did not exhibit: Callus, Crepitus, Excoriation, Fluctuance, Friable, Induration, Localized Edema, Rash, Scarring, Dry/Scaly, Maceration, Atrophie Blanche, Cyanosis, Ecchymosis, Hemosiderin Staining, Mottled, Pallor, Rubor, Erythema. Periwound temperature was noted as No Abnormality. Assessment AVAYLA, LIESCH Zavala. ([id number]) Active Problems ICD-10 S31.105A - Unspecified open wound of abdominal wall, periumbilic region without penetration into peritoneal cavity, initial encounter L02.221 - Furuncle of abdominal wall Plan Wound Cleansing: Wound #1  Right,Medial Abdomen - midline: Clean wound with Normal Saline. Anesthetic: Wound #1 Right,Medial Abdomen - midline: Topical Lidocaine 4% cream applied to wound bed prior to debridement Injected 2% Lidocaine without epinephrine prior to debridement Primary Wound Dressing: Wound #1 Right,Medial Abdomen - midline: Prisma Ag Secondary Dressing: Wound #1 Right,Medial Abdomen - midline: Dry Gauze Boardered Foam Dressing Dressing Change Frequency: Wound #1 Right,Medial Abdomen - midline: Change dressing every day. Follow-up Appointments: Wound #1 Right,Medial Abdomen - midline: Return Appointment in 1 week. Additional Orders / Instructions: Wound #1 Right,Medial Abdomen - midline: Increase protein intake. Activity as tolerated Other: - Vitamin C, A, MVI, ZINC The wound is looking very clean and Zavala have recommended we continue with Prisma AG on alternate days and offload this area as much as possible. Zavala have asked her to spread her visits to every 2 week if she so desires. Meagan Zavala, Meagan Zavala ([id number]) Electronic Signature(s) Signed: 06/11/2016 2:37:39 PM By: Christin Fudge MD, FACS Entered By: Christin Fudge on 06/11/2016 14:37:38 [name] -------------------------------------------------------------------------------- SuperBill Details Patient Name: Meagan Zavala. Date of Service: 06/11/2016 Medical Record Number: [id number] Patient Account Number: [id number] Date of Birth/Sex: [birth year] (70 y.o. 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

## 2016-06-12 NOTE — Progress Notes (Signed)
EMERII, ESCANDON ([id number]) Visit Report for 06/11/2016 Arrival Information Details Patient Name: [name], [name] Date of Service: 06/11/2016 1:30 PM Medical Record Number: [id number] Patient Account Number: [id number] Date of Birth/Sex: [birth year] (70 y.o. Female) Treating RN: Montey Hora Primary Care Physician: Deborra Medina Other Clinician: Referring Physician: Deborra Medina Treating Physician/Extender: Frann Rider in Treatment: 8 Visit Information History Since Last Visit Added or deleted any medications: No Patient Arrived: Ambulatory Any new allergies or adverse reactions: No Arrival Time: 13:41 Had a fall or experienced change in No Accompanied By: self activities of daily living that may affect Transfer Assistance: None risk of falls: Patient Identification Verified: Yes Signs or symptoms of abuse/neglect since last No Secondary Verification Process Yes visito Completed: Hospitalized since last visit: No Patient Requires Transmission-Based No Pain Present Now: No Precautions: Patient Has Alerts: No Electronic Signature(s) Signed: 06/11/2016 4:30:55 PM By: Montey Hora Entered By: Montey Hora on 06/11/2016 13:42:00 Meagan Zavala ([id number]) -------------------------------------------------------------------------------- Clinic Level of Care Assessment Details Patient Name: Meagan Austin I. Date of Service: 06/11/2016 1:30 PM Medical Record Number: [id number] Patient Account Number: [id number] Date of Birth/Sex: 11/19/1945 (70 y.o. Female) Treating RN: Afful, RN, BSN, [place] Primary Care Physician: Deborra Medina Other Clinician: Referring Physician: Deborra Medina Treating Physician/Extender: Frann Rider in Treatment: 8 Clinic Level of Care Assessment Items TOOL 4 Quantity Score []  - Use when only an EandM is performed on FOLLOW-UP visit 0 ASSESSMENTS - Nursing Assessment / Reassessment X - Reassessment of Co-morbidities (includes  updates in patient status) 1 10 X - Reassessment of Adherence to Treatment Plan 1 5 ASSESSMENTS - Wound and Skin Assessment / Reassessment X - Simple Wound Assessment / Reassessment - one wound 1 5 []  - Complex Wound Assessment / Reassessment - multiple wounds 0 []  - Dermatologic / Skin Assessment (not related to wound area) 0 ASSESSMENTS - Focused Assessment []  - Circumferential Edema Measurements - multi extremities 0 X - Nutritional Assessment / Counseling / Intervention 1 10 []  - Lower Extremity Assessment (monofilament, tuning fork, pulses) 0 []  - Peripheral Arterial Disease Assessment (using hand held doppler) 0 ASSESSMENTS - Ostomy and/or Continence Assessment and Care []  - Incontinence Assessment and Management 0 []  - Ostomy Care Assessment and Management (repouching, etc.) 0 PROCESS - Coordination of Care X - Simple Patient / Family Education for ongoing care 1 15 []  - Complex (extensive) Patient / Family Education for ongoing care 0 []  - Staff obtains Programmer, systems, Records, Test Results / Process Orders 0 []  - Staff telephones HHA, Nursing Homes / Clarify orders / etc 0 []  - Routine Transfer to another Facility (non-emergent condition) 0 [name], [name] ([id number]) []  - Routine Hospital Admission (non-emergent condition) 0 []  - New Admissions / Biomedical engineer / Ordering NPWT, Apligraf, etc. 0 []  - Emergency Hospital Admission (emergent condition) 0 []  - Simple Discharge Coordination 0 []  - Complex (extensive) Discharge Coordination 0 PROCESS - Special Needs []  - Pediatric / Minor Patient Management 0 []  - Isolation Patient Management 0 []  - Hearing / Language / Visual special needs 0 []  - Assessment of Community assistance (transportation, D/C planning, etc.) 0 []  - Additional assistance / Altered mentation 0 []  - Support Surface(s) Assessment (bed, cushion, seat, etc.) 0 INTERVENTIONS - Wound Cleansing / Measurement X - Simple Wound Cleansing - one wound 1 5 []  -  Complex Wound Cleansing - multiple wounds 0 X - Wound Imaging (photographs - any number of wounds) 1 5 []  - Wound Tracing (instead of photographs) 0  X - Simple Wound Measurement - one wound 1 5 []  - Complex Wound Measurement - multiple wounds 0 INTERVENTIONS - Wound Dressings X - Small Wound Dressing one or multiple wounds 1 10 []  - Medium Wound Dressing one or multiple wounds 0 []  - Large Wound Dressing one or multiple wounds 0 []  - Application of Medications - topical 0 []  - Application of Medications - injection 0 INTERVENTIONS - Miscellaneous []  - External ear exam 0 [name], [name] ([id number]) []  - Specimen Collection (cultures, biopsies, blood, body fluids, etc.) 0 []  - Specimen(s) / Culture(s) sent or taken to Lab for analysis 0 []  - Patient Transfer (multiple staff / Harrel Lemon Lift / Similar devices) 0 []  - Simple Staple / Suture removal (25 or less) 0 []  - Complex Staple / Suture removal (26 or more) 0 []  - Hypo / Hyperglycemic Management (close monitor of Blood Glucose) 0 []  - Ankle / Brachial Index (ABI) - do not check if billed separately 0 X - Vital Signs 1 5 Has the patient been seen at the hospital within the last three years: Yes Total Score: 75 Level Of Care: New/Established - Level 2 Electronic Signature(s) Signed: 06/11/2016 5:04:09 PM By: Regan Lemming BSN, RN Entered By: Regan Lemming on 06/11/2016 14:02:52 Meagan Zavala ([id number]) -------------------------------------------------------------------------------- Encounter Discharge Information Details Patient Name: Meagan Austin I. Date of Service: 06/11/2016 1:30 PM Medical Record Number: [id number] Patient Account Number: [id number] Date of Birth/Sex: Feb 11, 1946 (70 y.o. Female) Treating RN: Montey Hora Primary Care Physician: Deborra Medina Other Clinician: Referring Physician: Deborra Medina Treating Physician/Extender: Frann Rider in Treatment: 8 Encounter Discharge Information Items Discharge  Pain Level: 0 Discharge Condition: Stable Ambulatory Status: Ambulatory Discharge Destination: Home Transportation: Private Auto Accompanied By: self Schedule Follow-up Appointment: Yes Medication Reconciliation completed and provided to Patient/Care No Kaycee Mcgaugh: Provided on Clinical Summary of Care: 06/11/2016 Form Type Recipient Paper Patient [facility] Electronic Signature(s) Signed: 06/11/2016 3:00:21 PM By: Montey Hora Previous Signature: 06/11/2016 2:08:28 PM Version By: Ruthine Dose Entered By: Montey Hora on 06/11/2016 15:00:21 Meagan Austin I. ([id number]) -------------------------------------------------------------------------------- Multi Wound Chart Details Patient Name: Meagan Austin I. Date of Service: 06/11/2016 1:30 PM Medical Record Number: [id number] Patient Account Number: [id number] Date of Birth/Sex: 1946-07-25 (70 y.o. Female) Treating RN: Baruch Gouty, RN, BSN, Velva Harman Primary Care Physician: Deborra Medina Other Clinician: Referring Physician: Deborra Medina Treating Physician/Extender: Frann Rider in Treatment: 8 Vital Signs Height(in): 61 Pulse(bpm): 75 Weight(lbs): 125 Blood Pressure 100/40 (mmHg): Body Mass Index(BMI): 24 Temperature(F): Respiratory Rate 18 (breaths/min): Photos: [N/A:N/A] Wound Location: Right Abdomen - midline - N/A N/A Medial Wounding Event: Gradually Appeared N/A N/A Primary Etiology: Abscess N/A N/A Comorbid History: Confinement Anxiety N/A N/A Date Acquired: 02/27/2016 N/A N/A Weeks of Treatment: 8 N/A N/A Wound Status: Open N/A N/A Measurements L x W x D 0.3x0.9x0.1 N/A N/A (cm) Area (cm) : 0.212 N/A N/A Volume (cm) : 0.021 N/A N/A % Reduction in Area: 78.40% N/A N/A % Reduction in Volume: 92.90% N/A N/A Classification: Partial Thickness N/A N/A Exudate Amount: Large N/A N/A Exudate Type: Serous N/A N/A Exudate Color: amber N/A N/A Wound Margin: Flat and Intact N/A N/A Granulation Amount: Large (67-100%) N/A  N/A Granulation Quality: Pink N/A N/A Necrotic Amount: Small (1-33%) N/A N/A Benassi, [name] ([id number]) Exposed Structures: Fascia: No N/A N/A Fat: No Tendon: No Muscle: No Joint: No Bone: No Limited to Skin Breakdown Epithelialization: Small (1-33%) N/A N/A Periwound Skin Texture: Edema: No N/A N/A Excoriation: No Induration: No Callus: No Crepitus:  No Fluctuance: No Friable: No Rash: No Scarring: No Periwound Skin Moist: Yes N/A N/A Moisture: Maceration: No Dry/Scaly: No Periwound Skin Color: Atrophie Blanche: No N/A N/A Cyanosis: No Ecchymosis: No Erythema: No Hemosiderin Staining: No Mottled: No Pallor: No Rubor: No Temperature: No Abnormality N/A N/A Tenderness on No N/A N/A Palpation: Wound Preparation: Ulcer Cleansing: N/A N/A Rinsed/Irrigated with Saline Topical Anesthetic Applied: Other: lidocaine 4% Treatment Notes Electronic Signature(s) Signed: 06/11/2016 5:04:09 PM By: Regan Lemming BSN, RN Entered By: Regan Lemming on 06/11/2016 14:00:26 Meagan Zavala ([id number]) -------------------------------------------------------------------------------- Silex Details Patient Name: Meagan Austin I. Date of Service: 06/11/2016 1:30 PM Medical Record Number: [id number] Patient Account Number: [id number] Date of Birth/Sex: February 27, 1946 (70 y.o. Female) Treating RN: Afful, RN, BSN, Velva Harman Primary Care Physician: Deborra Medina Other Clinician: Referring Physician: Deborra Medina Treating Physician/Extender: Frann Rider in Treatment: 8 Active Inactive Malignancy/Atypical Etiology Nursing Diagnoses: Knowledge deficit related to disease process and management of atypical ulcer etiology Goals: Patient/caregiver will verbalize understanding of disease process and disease management of atypical ulcer etiology Date Initiated: 04/16/2016 Goal Status: Active Interventions: Assess patient and family medical history for signs and  symptoms of malignancy/atypical etiology upon admission Provide education on atypical ulcer etiologies Provide education on malignant ulcerations Treatment Activities: Biopsy for Pathology : 04/16/2016 Test ordered outside of clinic : 04/16/2016 Notes: Nutrition Nursing Diagnoses: Potential for alteratiion in Nutrition/Potential for imbalanced nutrition Goals: Patient/caregiver agrees to and verbalizes understanding of need to use nutritional supplements and/or vitamins as prescribed Date Initiated: 04/16/2016 Goal Status: Active Interventions: Assess patient nutrition upon admission and as needed per policy Provide education on nutrition DERI, DEGUIRE I. ([id number]) Notes: Orientation to the Wound Care Program Nursing Diagnoses: Knowledge deficit related to the wound healing center program Goals: Patient/caregiver will verbalize understanding of the [place] Program Date Initiated: 04/16/2016 Goal Status: Active Interventions: Provide education on orientation to the wound center Notes: Wound/Skin Impairment Nursing Diagnoses: Impaired tissue integrity Goals: Patient/caregiver will verbalize understanding of skin care regimen Date Initiated: 04/16/2016 Goal Status: Active Ulcer/skin breakdown will heal within 14 weeks Date Initiated: 04/16/2016 Goal Status: Active Interventions: Assess patient/caregiver ability to obtain necessary supplies Assess patient/caregiver ability to perform ulcer/skin care regimen upon admission and as needed Assess ulceration(s) every visit Treatment Activities: Skin care regimen initiated : 04/16/2016 Topical wound management initiated : 04/16/2016 Notes: Electronic Signature(s) Signed: 06/11/2016 5:04:09 PM By: Regan Lemming BSN, RN Entered By: Regan Lemming on 06/11/2016 13:59:37 Meagan Austin I. ([id number]) -------------------------------------------------------------------------------- Pain Assessment Details Patient Name:  Meagan Austin I. Date of Service: 06/11/2016 1:30 PM Medical Record Number: [id number] Patient Account Number: [id number] Date of Birth/Sex: 10-Aug-1946 (70 y.o. Female) Treating RN: Montey Hora Primary Care Physician: Deborra Medina Other Clinician: Referring Physician: Deborra Medina Treating Physician/Extender: Frann Rider in Treatment: 8 Active Problems Location of Pain Severity and Description of Pain Patient Has Paino No Site Locations Pain Management and Medication Current Pain Management: Notes Topical or injectable lidocaine is offered to patient for acute pain when surgical debridement is performed. If needed, Patient is instructed to use over the counter pain medication for the following 24-48 hours after debridement. Wound care MDs do not prescribed pain medications. Patient has chronic pain or uncontrolled pain. Patient has been instructed to make an appointment with their Primary Care Physician for pain management. Electronic Signature(s) Signed: 06/11/2016 4:30:55 PM By: Montey Hora Entered By: Montey Hora on 06/11/2016 13:42:10 Meagan Zavala ([id number]) -------------------------------------------------------------------------------- Patient/Caregiver Education Details Patient Name: Meagan Austin I. Date  of Service: 06/11/2016 1:30 PM Medical Record Number: [id number] Patient Account Number: [id number] Date of Birth/Gender: 07-Jan-1946 (70 y.o. Female) Treating RN: Montey Hora Primary Care Physician: Deborra Medina Other Clinician: Referring Physician: Deborra Medina Treating Physician/Extender: Frann Rider in Treatment: 8 Education Assessment Education Provided To: Patient Education Topics Provided Wound/Skin Impairment: Handouts: Other: wound care as ordered Methods: Demonstration, Explain/Verbal Responses: State content correctly Electronic Signature(s) Signed: 06/11/2016 4:30:55 PM By: Montey Hora Entered By: Montey Hora on  06/11/2016 15:01:53 Meagan Austin I. ([id number]) -------------------------------------------------------------------------------- Wound Assessment Details Patient Name: Meagan Austin I. Date of Service: 06/11/2016 1:30 PM Medical Record Number: [id number] Patient Account Number: [id number] Date of Birth/Sex: 07-08-1946 (70 y.o. Female) Treating RN: Montey Hora Primary Care Physician: Deborra Medina Other Clinician: Referring Physician: Deborra Medina Treating Physician/Extender: Frann Rider in Treatment: 8 Wound Status Wound Number: 1 Primary Etiology: Abscess Wound Location: Right Abdomen - midline - Wound Status: Open Medial Comorbid History: Confinement Anxiety Wounding Event: Gradually Appeared Date Acquired: 02/27/2016 Weeks Of Treatment: 8 Clustered Wound: No Photos Wound Measurements Length: (cm) 0.3 Width: (cm) 0.9 Depth: (cm) 0.1 Area: (cm) 0.212 Volume: (cm) 0.021 % Reduction in Area: 78.4% % Reduction in Volume: 92.9% Epithelialization: Small (1-33%) Tunneling: No Undermining: No Wound Description Classification: Partial Thickness Wound Margin: Flat and Intact Exudate Amount: Large Exudate Type: Serous Exudate Color: amber Foul Odor After Cleansing: No Wound Bed Granulation Amount: Large (67-100%) Exposed Structure Granulation Quality: Pink Fascia Exposed: No Necrotic Amount: Small (1-33%) Fat Layer Exposed: No Necrotic Quality: Adherent Slough Tendon Exposed: No Riden, [name] ([id number]) Muscle Exposed: No Joint Exposed: No Bone Exposed: No Limited to Skin Breakdown Periwound Skin Texture Texture Color No Abnormalities Noted: No No Abnormalities Noted: No Callus: No Atrophie Blanche: No Crepitus: No Cyanosis: No Excoriation: No Ecchymosis: No Fluctuance: No Erythema: No Friable: No Hemosiderin Staining: No Induration: No Mottled: No Localized Edema: No Pallor: No Rash: No Rubor: No Scarring: No Temperature /  Pain Moisture Temperature: No Abnormality No Abnormalities Noted: No Dry / Scaly: No Maceration: No Moist: Yes Wound Preparation Ulcer Cleansing: Rinsed/Irrigated with Saline Topical Anesthetic Applied: Other: lidocaine 4%, Treatment Notes Wound #1 (Right, Medial Abdomen - midline) 1. Cleansed with: Clean wound with Normal Saline 2. Anesthetic Topical Lidocaine 4% cream to wound bed prior to debridement 4. Dressing Applied: Prisma Ag 5. Secondary Dressing Applied Bordered Foam Dressing Dry Gauze Electronic Signature(s) Signed: 06/11/2016 4:30:55 PM By: Montey Hora Entered By: Montey Hora on 06/11/2016 13:51:26 Meagan Austin IMarland Kitchen ([id number]) -------------------------------------------------------------------------------- Vitals Details Patient Name: Meagan Austin I. Date of Service: 06/11/2016 1:30 PM Medical Record Number: [id number] Patient Account Number: [id number] Date of Birth/Sex: March 26, 1946 (70 y.o. Female) Treating RN: Montey Hora Primary Care Physician: Deborra Medina Other Clinician: Referring Physician: Deborra Medina Treating Physician/Extender: Frann Rider in Treatment: 8 Vital Signs Time Taken: 13:43 Pulse (bpm): 75 Height (in): 61 Respiratory Rate (breaths/min): 18 Weight (lbs): 125 Blood Pressure (mmHg): 100/40 Body Mass Index (BMI): 23.6 Reference Range: 80 - 120 mg / dl Electronic Signature(s) Signed: 06/11/2016 4:30:55 PM By: Montey Hora Entered By: Montey Hora on 06/11/2016 13:44:37

## 2016-06-16 ENCOUNTER — Telehealth: Payer: Self-pay | Admitting: *Deleted

## 2016-06-16 NOTE — Telephone Encounter (Signed)
Received self referral for initial lung cancer screening scan. Contacted patient and obtained smoking history,(former smoker, quit 2012, 60 pack year) as well as answering questions related to screening process. Patient denies signs of lung cancer such as weight loss or hemoptysis. Patient denies comorbidity that would prevent curative treatment if lung cancer were found. Patient is tentatively scheduled for shared decision making visit and CT scan on 06/23/16 at 1:30pm, pending insurance approval from business office.

## 2016-06-17 ENCOUNTER — Inpatient Hospital Stay: Payer: PPO

## 2016-06-18 ENCOUNTER — Encounter: Payer: PPO | Admitting: Surgery

## 2016-06-18 DIAGNOSIS — S31105A Unspecified open wound of abdominal wall, periumbilic region without penetration into peritoneal cavity, initial encounter: Secondary | ICD-10-CM | POA: Diagnosis not present

## 2016-06-19 NOTE — Progress Notes (Signed)
SHIRLYN, SUTLIFF ([id number]) Visit Report for 06/18/2016 Chief Complaint Document Details Patient Name: KIANNI, SCHNORR I. Date of Service: 06/18/2016 3:00 PM Medical Record Number: [id number] Patient Account Number: [id number] Date of Birth/Sex: 1946-03-10 (70 y.o. Female) Treating RN: Montey Hora Primary Care Physician: Deborra Medina Other Clinician: Referring Physician: Deborra Medina Treating Physician/Extender: Frann Rider in Treatment: 9 Information Obtained from: Patient Chief Complaint Patient seen for complaints of Non-Healing Wound to the right of from licorice on her abdominal wall for about a month Electronic Signature(s) Signed: 06/18/2016 3:32:44 PM By: Christin Fudge MD, FACS Entered By: Christin Fudge on 06/18/2016 15:32:44 [name] ([id number]) -------------------------------------------------------------------------------- HPI Details Patient Name: [name] I. Date of Service: 06/18/2016 3:00 PM Medical Record Number: [id number] Patient Account Number: [id number] Date of Birth/Sex: 09-10-1946 (70 y.o. Female) Treating RN: Montey Hora Primary Care Physician: Deborra Medina Other Clinician: Referring Physician: Deborra Medina Treating Physician/Extender: Frann Rider in Treatment: 9 History of Present Illness Location: umbilical region of the abdominal wall Quality: an open ulcerated area some amount of pain and burning Severity: Patient states wound are getting worse. Duration: Patient has had the wound for < 4 weeks prior to presenting for treatment Timing: Pain in wound is Intermittent (comes and goes Context: The wound appeared gradually over time Modifying Factors: Other treatment(s) tried include:and doxycycline the present time Associated Signs and Symptoms: Patient reports having increase discharge. HPI Description: 70 year old patient who was recently seen by Dr. Tommi Rumps was noted to have a small ulceration on abdomen  wall. the patient has had it for about 6 weeks in the periumbilical areaThe patient noted that it has become slightly larger and draining purulent material.was started on doxycycline and given an appointment to the wound center. She does not recall if she had any insect bite or injury her past medical history significant for GERD, hypothyroidism, osteoporosis, chronic kidney disease stage III, chronic low back pain, anemia, vitamin D deficiency and urge incontinence. 7/27//2017 -- pathology report of specimen done -- DIAGNOSIS: A. SKIN, ABDOMEN; BIOPSY: - CHRONIC ULCER. - NEGATIVE FOR MALIGNANCY Electronic Signature(s) Signed: 06/18/2016 3:32:55 PM By: Christin Fudge MD, FACS Entered By: Christin Fudge on 06/18/2016 15:32:54 [name] ([id number]) -------------------------------------------------------------------------------- Physical Exam Details Patient Name: [name] I. Date of Service: 06/18/2016 3:00 PM Medical Record Number: [id number] Patient Account Number: [id number] Date of Birth/Sex: 07/19/1946 (70 y.o. Female) Treating RN: Montey Hora Primary Care Physician: Deborra Medina Other Clinician: Referring Physician: Deborra Medina Treating Physician/Extender: Frann Rider in Treatment: 9 Constitutional . Pulse regular. Respirations normal and unlabored. Afebrile. . Eyes Nonicteric. Reactive to light. Ears, Nose, Mouth, and Throat Lips, teeth, and gums WNL.Marland Kitchen Moist mucosa without lesions. Neck supple and nontender. No palpable supraclavicular or cervical adenopathy. Normal sized without goiter. Respiratory WNL. No retractions.. Cardiovascular Pedal Pulses WNL. No clubbing, cyanosis or edema. Lymphatic No adneopathy. No adenopathy. No adenopathy. Musculoskeletal Adexa without tenderness or enlargement.. Digits and nails w/o clubbing, cyanosis, infection, petechiae, ischemia, or inflammatory conditions.. Integumentary (Hair, Skin) No suspicious lesions. No  crepitus or fluctuance. No peri-wound warmth or erythema. No masses.Marland Kitchen Psychiatric Judgement and insight Intact.. No evidence of depression, anxiety, or agitation.. Notes there is healthy scar tissue and a minimal open area and overall there is much improvement Electronic Signature(s) Signed: 06/18/2016 3:33:28 PM By: Christin Fudge MD, FACS Entered By: Christin Fudge on 06/18/2016 15:33:27 [name] ([id number]) -------------------------------------------------------------------------------- Physician Orders Details Patient Name: [name] I. Date of Service: 06/18/2016 3:00 PM Medical Record Number:  [id number] Patient Account Number: [id number] Date of Birth/Sex: Aug 26, 1946 (70 y.o. Female) Treating RN: Cornell Barman Primary Care Physician: Deborra Medina Other Clinician: Referring Physician: Deborra Medina Treating Physician/Extender: Frann Rider in Treatment: 9 Verbal / Phone Orders: Yes Clinician: Cornell Barman Read Back and Verified: Yes Diagnosis Coding Wound Cleansing Wound #1 Right,Medial Abdomen - midline o Clean wound with Normal Saline. Anesthetic Wound #1 Right,Medial Abdomen - midline o Topical Lidocaine 4% cream applied to wound bed prior to debridement o Injected 2% Lidocaine without epinephrine prior to debridement Primary Wound Dressing Wound #1 Right,Medial Abdomen - midline o Prisma Ag Secondary Dressing Wound #1 Right,Medial Abdomen - midline o Dry Gauze o Boardered Foam Dressing Dressing Change Frequency Wound #1 Right,Medial Abdomen - midline o Change dressing every day. Follow-up Appointments Wound #1 Right,Medial Abdomen - midline o Return Appointment in 1 week. Additional Orders / Instructions Wound #1 Right,Medial Abdomen - midline o Increase protein intake. o Activity as tolerated o Other: - Vitamin C, A, MVI, ZINC Electronic Signature(s) Signed: 06/18/2016 4:25:20 PM By: Christin Fudge MD, FACS [name]  ([id number]) Signed: 06/19/2016 10:50:50 AM By: Gretta Cool, RN, BSN, Kim RN, BSN Entered By: Gretta Cool, RN, BSN, Kim on 06/18/2016 15:30:28 [name] -------------------------------------------------------------------------------- Problem List Details Patient Name: [name] I. Date of Service: 06/18/2016 3:00 PM Medical Record Number: [id number] Patient Account Number: [id number] Date of Birth/Sex: [birth year] (70 y.o. 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 Date of Service: 06/18/2016 3:00 PM Medical Record Number: [id number] Patient Account Number: [id number] Date of Birth/Sex: 27-Nov-1945 (70 y.o. Female) Treating RN: Montey Hora Primary Care Physician: Deborra Medina Other Clinician: Referring Physician: Deborra Medina Treating Physician/Extender: Frann Rider in Treatment: 9 Subjective Chief Complaint Information obtained from Patient Patient seen for complaints of Non-Healing Wound to the right of from licorice on her abdominal wall for about a month History of Present Illness (HPI) The following HPI  elements were documented for the patient's wound: Location: umbilical region of the abdominal wall Quality: an open ulcerated area some amount of pain and burning Severity: Patient states wound are getting worse. Duration: Patient has had the wound for < 4 weeks prior to presenting for treatment Timing: Pain in wound is Intermittent (comes and goes Context: The wound appeared gradually over time Modifying Factors: Other treatment(s) tried include:and doxycycline the present time Associated Signs and Symptoms: Patient reports having increase discharge. 70 year old patient who was recently seen by Dr. Tommi Rumps was noted to have a small ulceration on abdomen wall. the patient has had it for about 6 weeks in the periumbilical areaThe patient noted that it has become slightly larger and draining purulent material.was started on doxycycline and given an appointment to the wound center. She does not recall if she had any insect bite or injury her past medical history significant for GERD, hypothyroidism, osteoporosis, chronic kidney disease stage III, chronic low back pain, anemia, vitamin  D deficiency and urge incontinence. 7/27//2017 -- pathology report of specimen done -- DIAGNOSIS: A. SKIN, ABDOMEN; BIOPSY: - CHRONIC ULCER. - NEGATIVE FOR MALIGNANCY Objective Constitutional Pulse regular. Respirations normal and unlabored. Afebrile. Vitals Time Taken: 3:18 PM, Height: 61 in, Weight: 125 lbs, BMI: 23.6, Temperature: 98.1 F, Pulse: 71 bpm, Respiratory Rate: 16 breaths/min, Blood Pressure: 112/51 mmHg. JOLIANA, AHOLA I. ([id number]) General Notes: BP is patients normal Eyes Nonicteric. Reactive to light. Ears, Nose, Mouth, and Throat Lips, teeth, and gums WNL.Marland Kitchen Moist mucosa without lesions. Neck supple and nontender. No palpable supraclavicular or cervical adenopathy. Normal sized without goiter. Respiratory WNL. No retractions.. Cardiovascular Pedal Pulses WNL. No clubbing,  cyanosis or edema. Lymphatic No adneopathy. No adenopathy. No adenopathy. Musculoskeletal Adexa without tenderness or enlargement.. Digits and nails w/o clubbing, cyanosis, infection, petechiae, ischemia, or inflammatory conditions.Marland Kitchen Psychiatric Judgement and insight Intact.. No evidence of depression, anxiety, or agitation.. General Notes: there is healthy scar tissue and a minimal open area and overall there is much improvement Integumentary (Hair, Skin) No suspicious lesions. No crepitus or fluctuance. No peri-wound warmth or erythema. No masses.. Wound #1 status is Open. Original cause of wound was Gradually Appeared. The wound is located on the Right,Medial Abdomen - midline. The wound measures 0.2cm length x 0.7cm width x 0.1cm depth; 0.11cm^2 area and 0.011cm^3 volume. The wound is limited to skin breakdown. There is no tunneling or undermining noted. There is a large amount of serous drainage noted. The wound margin is flat and intact. There is large (67-100%) pink granulation within the wound bed. There is a small (1-33%) amount of necrotic tissue within the wound bed including Adherent Slough. The periwound skin appearance exhibited: Moist. The periwound skin appearance did not exhibit: Callus, Crepitus, Excoriation, Fluctuance, Friable, Induration, Localized Edema, Rash, Scarring, Dry/Scaly, Maceration, Atrophie Blanche, Cyanosis, Ecchymosis, Hemosiderin Staining, Mottled, Pallor, Rubor, Erythema. Periwound temperature was noted as No Abnormality. Assessment MARYDELL, VOHRA I. ([id number]) Active Problems ICD-10 S31.105A - Unspecified open wound of abdominal wall, periumbilic region without penetration into peritoneal cavity, initial encounter L02.221 - Furuncle of abdominal wall Plan Wound Cleansing: Wound #1 Right,Medial Abdomen - midline: Clean wound with Normal Saline. Anesthetic: Wound #1 Right,Medial Abdomen - midline: Topical Lidocaine 4% cream applied to wound bed  prior to debridement Injected 2% Lidocaine without epinephrine prior to debridement Primary Wound Dressing: Wound #1 Right,Medial Abdomen - midline: Prisma Ag Secondary Dressing: Wound #1 Right,Medial Abdomen - midline: Dry Gauze Boardered Foam Dressing Dressing Change Frequency: Wound #1 Right,Medial Abdomen - midline: Change dressing every day. Follow-up Appointments: Wound #1 Right,Medial Abdomen - midline: Return Appointment in 1 week. Additional Orders / Instructions: Wound #1 Right,Medial Abdomen - midline: Increase protein intake. Activity as tolerated Other: - Vitamin C, A, MVI, ZINC The wound is looking very clean and I have recommended we continue with Prisma AG on alternate days and offload this area as much as possible. I have asked her to spread her visits to every 2 week if she so desires. LOREL, NOLL ([id number]) Electronic Signature(s) Signed: 06/18/2016 3:34:07 PM By: Christin Fudge MD, FACS Entered By: Christin Fudge on 06/18/2016 15:34:06 [name] IMarland Kitchen ([id number]) -------------------------------------------------------------------------------- SuperBill Details Patient Name: [name] I. Date of Service: 06/18/2016 Medical Record Number: [id number] Patient Account Number: [id number] Date of Birth/Sex: December 24, 1945 (70 y.o. Female) Treating RN: Montey Hora Primary Care Physician: Deborra Medina Other Clinician: Referring Physician: Deborra Medina Treating Physician/Extender: Frann Rider in Treatment: 9 Diagnosis Coding ICD-10 Codes Code Description Unspecified open wound of abdominal  wall, periumbilic region without penetration into S31.105A peritoneal cavity, initial encounter 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 Facility Procedures CPT4 Code: [id number] Description: [phone] - WOUND CARE VISIT-LEV 2 EST PT Modifier: Quantity: 1 Physician Procedures CPT4: Description Modifier Quantity Code [id number] - WC PHYS LEVEL 3 - EST PT 1  ICD-10 Description Diagnosis S31.105A Unspecified open wound of abdominal wall, periumbilic region without penetration into peritoneal cavity, initial encounter L02.221  Furuncle of abdominal wall Electronic Signature(s) Signed: 06/19/2016 10:50:50 AM By: Gretta Cool RN, BSN, Kim RN, BSN Signed: 06/19/2016 3:52:17 PM By: Christin Fudge MD, FACS Previous Signature: 06/18/2016 3:34:36 PM Version By: Christin Fudge MD, FACS Previous Signature: 06/18/2016 3:34:22 PM Version By: Christin Fudge MD, FACS Entered By: Gretta Cool RN, BSN, Kim on 06/19/2016 10:38:41

## 2016-06-19 NOTE — Progress Notes (Signed)
Meagan Zavala, Meagan Zavala ([id number]) Visit Report for 06/18/2016 Arrival Information Details Patient Name: Meagan Zavala, Meagan Zavala. Date of Service: 06/18/2016 3:00 PM Medical Record Number: [id number] Patient Account Number: [id number] Date of Birth/Sex: 1946/06/08 (70 y.o. Female) Treating RN: Cornell Barman Primary Care Physician: Deborra Medina Other Clinician: Referring Physician: Deborra Medina Treating Physician/Extender: Frann Rider in Treatment: 9 Visit Information History Since Last Visit Added or deleted any medications: No Patient Arrived: Ambulatory Any new allergies or adverse reactions: No Arrival Time: 15:16 Had a fall or experienced change in No Accompanied By: self activities of daily living that may affect Transfer Assistance: Manual risk of falls: Patient Identification Verified: Yes Signs or symptoms of abuse/neglect since last No Secondary Verification Process Yes visito Completed: Hospitalized since last visit: No Patient Requires Transmission-Based No Has Dressing in Place as Prescribed: Yes Precautions: Pain Present Now: No Patient Has Alerts: No Electronic Signature(s) Signed: 06/19/2016 10:50:50 AM By: Gretta Cool, RN, BSN, Kim RN, BSN Entered By: Gretta Cool, RN, BSN, Kim on 06/18/2016 15:17:07 Meagan Zavala ([id number]) -------------------------------------------------------------------------------- Clinic Level of Care Assessment Details Patient Name: Meagan Zavala. Date of Service: 06/18/2016 3:00 PM Medical Record Number: [id number] Patient Account Number: [id number] Date of Birth/Sex: 15-Jul-1946 (70 y.o. Female) Treating RN: Cornell Barman Primary Care Physician: Deborra Medina Other Clinician: Referring Physician: Deborra Medina Treating Physician/Extender: Frann Rider in Treatment: 9 Clinic Level of Care Assessment Items TOOL 4 Quantity Score []  - Use when only Zavala EandM is performed on FOLLOW-UP visit 0 ASSESSMENTS - Nursing Assessment /  Reassessment []  - Reassessment of Co-morbidities (includes updates in patient status) 0 X - Reassessment of Adherence to Treatment Plan 1 5 ASSESSMENTS - Wound and Skin Assessment / Reassessment X - Simple Wound Assessment / Reassessment - one wound 1 5 []  - Complex Wound Assessment / Reassessment - multiple wounds 0 []  - Dermatologic / Skin Assessment (not related to wound area) 0 ASSESSMENTS - Focused Assessment []  - Circumferential Edema Measurements - multi extremities 0 []  - Nutritional Assessment / Counseling / Intervention 0 []  - Lower Extremity Assessment (monofilament, tuning fork, pulses) 0 []  - Peripheral Arterial Disease Assessment (using hand held doppler) 0 ASSESSMENTS - Ostomy and/or Continence Assessment and Care []  - Incontinence Assessment and Management 0 []  - Ostomy Care Assessment and Management (repouching, etc.) 0 PROCESS - Coordination of Care X - Simple Patient / Family Education for ongoing care 1 15 []  - Complex (extensive) Patient / Family Education for ongoing care 0 X - Staff obtains Programmer, systems, Records, Test Results / Process Orders 1 10 []  - Staff telephones HHA, Nursing Homes / Clarify orders / etc 0 []  - Routine Transfer to another Facility (non-emergent condition) 0 Holsonback, Meagan Zavala. ([id number]) []  - Routine Hospital Admission (non-emergent condition) 0 []  - New Admissions / Biomedical engineer / Ordering NPWT, Apligraf, etc. 0 []  - Emergency Hospital Admission (emergent condition) 0 X - Simple Discharge Coordination 1 10 []  - Complex (extensive) Discharge Coordination 0 PROCESS - Special Needs []  - Pediatric / Minor Patient Management 0 []  - Isolation Patient Management 0 []  - Hearing / Language / Visual special needs 0 []  - Assessment of Community assistance (transportation, D/C planning, etc.) 0 []  - Additional assistance / Altered mentation 0 []  - Support Surface(s) Assessment (bed, cushion, seat, etc.) 0 INTERVENTIONS - Wound Cleansing /  Measurement X - Simple Wound Cleansing - one wound 1 5 []  - Complex Wound Cleansing - multiple wounds 0 X - Wound Imaging (photographs - any number of  wounds) 1 5 []  - Wound Tracing (instead of photographs) 0 X - Simple Wound Measurement - one wound 1 5 []  - Complex Wound Measurement - multiple wounds 0 INTERVENTIONS - Wound Dressings X - Small Wound Dressing one or multiple wounds 1 10 []  - Medium Wound Dressing one or multiple wounds 0 []  - Large Wound Dressing one or multiple wounds 0 []  - Application of Medications - topical 0 []  - Application of Medications - injection 0 INTERVENTIONS - Miscellaneous []  - External ear exam 0 Sterry, Meagan Zavala. ([id number]) []  - Specimen Collection (cultures, biopsies, blood, body fluids, etc.) 0 []  - Specimen(s) / Culture(s) sent or taken to Lab for analysis 0 []  - Patient Transfer (multiple staff / Harrel Lemon Lift / Similar devices) 0 []  - Simple Staple / Suture removal (25 or less) 0 []  - Complex Staple / Suture removal (26 or more) 0 []  - Hypo / Hyperglycemic Management (close monitor of Blood Glucose) 0 []  - Ankle / Brachial Index (ABI) - do not check if billed separately 0 X - Vital Signs 1 5 Has the patient been seen at the hospital within the last three years: Yes Total Score: 75 Level Of Care: New/Established - Level 2 Electronic Signature(s) Signed: 06/19/2016 10:50:50 AM By: Gretta Cool, RN, BSN, Kim RN, BSN Entered By: Gretta Cool, RN, BSN, Kim on 06/18/2016 15:35:03 Meagan Zavala ([id number]) -------------------------------------------------------------------------------- Encounter Discharge Information Details Patient Name: Meagan Zavala. Date of Service: 06/18/2016 3:00 PM Medical Record Number: [id number] Patient Account Number: [id number] Date of Birth/Sex: 01-15-1946 (70 y.o. Female) Treating RN: Cornell Barman Primary Care Physician: Deborra Medina Other Clinician: Referring Physician: Deborra Medina Treating Physician/Extender: Frann Rider in Treatment: 9 Encounter Discharge Information Items Discharge Pain Level: 0 Discharge Condition: Stable Ambulatory Status: Ambulatory Discharge Destination: Home Transportation: Private Auto Accompanied By: self Schedule Follow-up Appointment: Yes Medication Reconciliation completed and provided to Patient/Care Yes Meagan Zavala: Provided on Clinical Summary of Care: 06/18/2016 Form Type Recipient Paper Patient [facility] Electronic Signature(s) Signed: 06/18/2016 3:36:14 PM By: Ruthine Dose Entered By: Ruthine Dose on 06/18/2016 15:36:14 Meagan Zavala. ([id number]) -------------------------------------------------------------------------------- Multi Wound Chart Details Patient Name: Meagan Zavala. Date of Service: 06/18/2016 3:00 PM Medical Record Number: [id number] Patient Account Number: [id number] Date of Birth/Sex: 12-Jan-1946 (70 y.o. Female) Treating RN: Cornell Barman Primary Care Physician: Deborra Medina Other Clinician: Referring Physician: Deborra Medina Treating Physician/Extender: Frann Rider in Treatment: 9 Vital Signs Height(in): 61 Pulse(bpm): 71 Weight(lbs): 125 Blood Pressure 112/51 (mmHg): Body Mass Index(BMI): 24 Temperature(F): 98.1 Respiratory Rate 16 (breaths/min): Photos: [N/A:N/A] Wound Location: Right Abdomen - midline - N/A N/A Medial Wounding Event: Gradually Appeared N/A N/A Primary Etiology: Abscess N/A N/A Comorbid History: Confinement Anxiety N/A N/A Date Acquired: 02/27/2016 N/A N/A Weeks of Treatment: 9 N/A N/A Wound Status: Open N/A N/A Measurements L x W x D 0.2x0.7x0.1 N/A N/A (cm) Area (cm) : 0.11 N/A N/A Volume (cm) : 0.011 N/A N/A % Reduction in Area: 88.80% N/A N/A % Reduction in Volume: 96.30% N/A N/A Classification: Partial Thickness N/A N/A Exudate Amount: Large N/A N/A Exudate Type: Serous N/A N/A Exudate Color: amber N/A N/A Wound Margin: Flat and Intact N/A N/A Granulation Amount: Large (67-100%)  N/A N/A Granulation Quality: Pink N/A N/A Necrotic Amount: Small (1-33%) N/A N/A Exposed Structures: Fascia: No N/A N/A Fat: No Ferraz, Meagan Zavala. ([id number]) Tendon: No Muscle: No Joint: No Bone: No Limited to Skin Breakdown Epithelialization: Small (1-33%) N/A N/A Periwound Skin Texture: Edema: No N/A N/A Excoriation: No  Induration: No Callus: No Crepitus: No Fluctuance: No Friable: No Rash: No Scarring: No Periwound Skin Moist: Yes N/A N/A Moisture: Maceration: No Dry/Scaly: No Periwound Skin Color: Atrophie Blanche: No N/A N/A Cyanosis: No Ecchymosis: No Erythema: No Hemosiderin Staining: No Mottled: No Pallor: No Rubor: No Temperature: No Abnormality N/A N/A Tenderness on No N/A N/A Palpation: Wound Preparation: Ulcer Cleansing: N/A N/A Rinsed/Irrigated with Saline Topical Anesthetic Applied: Other: lidocaine 4% Treatment Notes Electronic Signature(s) Signed: 06/19/2016 10:50:50 AM By: Gretta Cool, RN, BSN, Kim RN, BSN Entered By: Gretta Cool, RN, BSN, Kim on 06/18/2016 15:29:41 Meagan Zavala ([id number]) -------------------------------------------------------------------------------- [place] Details Patient Name: Meagan Zavala. Date of Service: 06/18/2016 3:00 PM Medical Record Number: [id number] Patient Account Number: [id number] Date of Birth/Sex: Dec 03, 1945 (70 y.o. Female) Treating RN: Cornell Barman Primary Care Physician: Deborra Medina Other Clinician: Referring Physician: Deborra Medina Treating Physician/Extender: Frann Rider in Treatment: 9 Active Inactive Malignancy/Atypical Etiology Nursing Diagnoses: Knowledge deficit related to disease process and management of atypical ulcer etiology Goals: Patient/caregiver will verbalize understanding of disease process and disease management of atypical ulcer etiology Date Initiated: 04/16/2016 Goal Status: Active Interventions: Assess patient and family medical history for  signs and symptoms of malignancy/atypical etiology upon admission Provide education on atypical ulcer etiologies Provide education on malignant ulcerations Treatment Activities: Biopsy for Pathology : 04/16/2016 Test ordered outside of clinic : 04/16/2016 Notes: Nutrition Nursing Diagnoses: Potential for alteratiion in Nutrition/Potential for imbalanced nutrition Goals: Patient/caregiver agrees to and verbalizes understanding of need to use nutritional supplements and/or vitamins as prescribed Date Initiated: 04/16/2016 Goal Status: Active Interventions: Assess patient nutrition upon admission and as needed per policy Provide education on nutrition Meagan, SROCZYNSKI Zavala. ([id number]) Notes: Orientation to the Wound Care Program Nursing Diagnoses: Knowledge deficit related to the wound healing center program Goals: Patient/caregiver will verbalize understanding of the [facility] Date Initiated: 04/16/2016 Goal Status: Active Interventions: Provide education on orientation to the wound center Notes: Wound/Skin Impairment Nursing Diagnoses: Impaired tissue integrity Goals: Patient/caregiver will verbalize understanding of skin care regimen Date Initiated: 04/16/2016 Goal Status: Active Ulcer/skin breakdown will heal within 14 weeks Date Initiated: 04/16/2016 Goal Status: Active Interventions: Assess patient/caregiver ability to obtain necessary supplies Assess patient/caregiver ability to perform ulcer/skin care regimen upon admission and as needed Assess ulceration(s) every visit Treatment Activities: Skin care regimen initiated : 04/16/2016 Topical wound management initiated : 04/16/2016 Notes: Electronic Signature(s) Signed: 06/19/2016 10:50:50 AM By: Gretta Cool, RN, BSN, Kim RN, BSN Entered By: Gretta Cool, RN, BSN, Kim on 06/18/2016 15:25:12 Meagan Zavala ([id number]) -------------------------------------------------------------------------------- Pain Assessment  Details Patient Name: Meagan Zavala. Date of Service: 06/18/2016 3:00 PM Medical Record Number: [id number] Patient Account Number: [id number] Date of Birth/Sex: 02-20-1946 (70 y.o. Female) Treating RN: Cornell Barman Primary Care Physician: Deborra Medina Other Clinician: Referring Physician: Deborra Medina Treating Physician/Extender: Frann Rider in Treatment: 9 Active Problems Location of Pain Severity and Description of Pain Patient Has Paino No Site Locations With Dressing Change: No Pain Management and Medication Current Pain Management: Electronic Signature(s) Signed: 06/19/2016 10:50:50 AM By: Gretta Cool, RN, BSN, Kim RN, BSN Entered By: Gretta Cool, RN, BSN, Kim on 06/18/2016 15:18:10 Meagan Zavala ([id number]) -------------------------------------------------------------------------------- Patient/Caregiver Education Details Patient Name: Meagan Zavala. Date of Service: 06/18/2016 3:00 PM Medical Record Number: [id number] Patient Account Number: [id number] Date of Birth/Gender: 11-15-1945 (70 y.o. Female) Treating RN: Cornell Barman Primary Care Physician: Deborra Medina Other Clinician: Referring Physician: Deborra Medina Treating Physician/Extender: Frann Rider in Treatment: 9 Education Assessment Education  Provided To: Patient Education Topics Provided Wound/Skin Impairment: Handouts: Caring for Your Ulcer Methods: Demonstration Responses: State content correctly Electronic Signature(s) Signed: 06/19/2016 10:50:50 AM By: Gretta Cool, RN, BSN, Kim RN, BSN Entered By: Gretta Cool, RN, BSN, Kim on 06/18/2016 15:36:03 Meagan Zavala ([id number]) -------------------------------------------------------------------------------- Wound Assessment Details Patient Name: Meagan Zavala. Date of Service: 06/18/2016 3:00 PM Medical Record Number: [id number] Patient Account Number: [id number] Date of Birth/Sex: 12-07-1945 (70 y.o. Female) Treating RN: Cornell Barman Primary Care  Physician: Deborra Medina Other Clinician: Referring Physician: Deborra Medina Treating Physician/Extender: Frann Rider in Treatment: 9 Wound Status Wound Number: 1 Primary Etiology: Abscess Wound Location: Right Abdomen - midline - Wound Status: Open Medial Comorbid History: Confinement Anxiety Wounding Event: Gradually Appeared Date Acquired: 02/27/2016 Weeks Of Treatment: 9 Clustered Wound: No Photos Wound Measurements Length: (cm) 0.2 Width: (cm) 0.7 Depth: (cm) 0.1 Area: (cm) 0.11 Volume: (cm) 0.011 % Reduction in Area: 88.8% % Reduction in Volume: 96.3% Epithelialization: Small (1-33%) Tunneling: No Undermining: No Wound Description Classification: Partial Thickness Wound Margin: Flat and Intact Exudate Amount: Large Exudate Type: Serous Exudate Color: amber Foul Odor After Cleansing: No Wound Bed Granulation Amount: Large (67-100%) Exposed Structure Granulation Quality: Pink Fascia Exposed: No Necrotic Amount: Small (1-33%) Fat Layer Exposed: No Necrotic Quality: Adherent Slough Tendon Exposed: No Muscle Exposed: No Joint Exposed: No Bone Exposed: No Pepper, Meagan Zavala. ([id number]) Limited to Skin Breakdown Periwound Skin Texture Texture Color No Abnormalities Noted: No No Abnormalities Noted: No Callus: No Atrophie Blanche: No Crepitus: No Cyanosis: No Excoriation: No Ecchymosis: No Fluctuance: No Erythema: No Friable: No Hemosiderin Staining: No Induration: No Mottled: No Localized Edema: No Pallor: No Rash: No Rubor: No Scarring: No Temperature / Pain Moisture Temperature: No Abnormality No Abnormalities Noted: No Dry / Scaly: No Maceration: No Moist: Yes Wound Preparation Ulcer Cleansing: Rinsed/Irrigated with Saline Topical Anesthetic Applied: Other: lidocaine 4%, Treatment Notes Wound #1 (Right, Medial Abdomen - midline) 1. Cleansed with: Clean wound with Normal Saline 2. Anesthetic Topical Lidocaine 4% cream to  wound bed prior to debridement 4. Dressing Applied: Prisma Ag 5. Secondary Dressing Applied Bordered Foam Dressing Saline moistened guaze Electronic Signature(s) Signed: 06/19/2016 10:50:50 AM By: Gretta Cool, RN, BSN, Kim RN, BSN Entered By: Gretta Cool, RN, BSN, Kim on 06/18/2016 15:24:00 Meagan Zavala ([id number]) -------------------------------------------------------------------------------- Vitals Details Patient Name: Meagan Zavala. Date of Service: 06/18/2016 3:00 PM Medical Record Number: [id number] Patient Account Number: [id number] Date of Birth/Sex: 09-23-1946 (70 y.o. Female) Treating RN: Cornell Barman Primary Care Physician: Deborra Medina Other Clinician: Referring Physician: Deborra Medina Treating Physician/Extender: Frann Rider in Treatment: 9 Vital Signs Time Taken: 15:18 Temperature (F): 98.1 Height (in): 61 Pulse (bpm): 71 Weight (lbs): 125 Respiratory Rate (breaths/min): 16 Body Mass Index (BMI): 23.6 Blood Pressure (mmHg): 112/51 Reference Range: 80 - 120 mg / dl Notes BP is patients normal Electronic Signature(s) Signed: 06/19/2016 10:50:50 AM By: Gretta Cool, RN, BSN, Kim RN, BSN Entered By: Gretta Cool, RN, BSN, Kim on 06/18/2016 15:19:17

## 2016-06-23 ENCOUNTER — Encounter: Payer: Self-pay | Admitting: Oncology

## 2016-06-23 ENCOUNTER — Other Ambulatory Visit: Payer: Self-pay | Admitting: *Deleted

## 2016-06-23 ENCOUNTER — Inpatient Hospital Stay: Payer: PPO | Admitting: Oncology

## 2016-06-23 ENCOUNTER — Ambulatory Visit: Payer: PPO | Attending: Oncology

## 2016-06-23 DIAGNOSIS — Z87891 Personal history of nicotine dependence: Secondary | ICD-10-CM

## 2016-06-24 ENCOUNTER — Inpatient Hospital Stay: Payer: PPO

## 2016-06-24 ENCOUNTER — Telehealth: Payer: Self-pay | Admitting: *Deleted

## 2016-06-24 VITALS — BP 102/64 | HR 68 | Temp 97.8°F | Resp 17

## 2016-06-24 DIAGNOSIS — D500 Iron deficiency anemia secondary to blood loss (chronic): Secondary | ICD-10-CM

## 2016-06-24 LAB — CBC WITH DIFFERENTIAL/PLATELET
BASOS ABS: 0 10*3/uL (ref 0–0.1)
Basophils Relative: 1 %
EOS PCT: 2 %
Eosinophils Absolute: 0.1 10*3/uL (ref 0–0.7)
HCT: 28.3 % — ABNORMAL LOW (ref 35.0–47.0)
HEMOGLOBIN: 8.9 g/dL — AB (ref 12.0–16.0)
LYMPHS PCT: 26 %
Lymphs Abs: 1.1 10*3/uL (ref 1.0–3.6)
MCH: 25.5 pg — ABNORMAL LOW (ref 26.0–34.0)
MCHC: 31.4 g/dL — ABNORMAL LOW (ref 32.0–36.0)
MCV: 81.2 fL (ref 80.0–100.0)
Monocytes Absolute: 0.3 10*3/uL (ref 0.2–0.9)
Monocytes Relative: 7 %
NEUTROS PCT: 64 %
Neutro Abs: 2.7 10*3/uL (ref 1.4–6.5)
PLATELETS: 234 10*3/uL (ref 150–440)
RBC: 3.49 MIL/uL — AB (ref 3.80–5.20)
RDW: 17.8 % — ABNORMAL HIGH (ref 11.5–14.5)
WBC: 4.1 10*3/uL (ref 3.6–11.0)

## 2016-06-24 MED ORDER — SODIUM CHLORIDE 0.9 % IV SOLN
Freq: Once | INTRAVENOUS | Status: AC
  Administered 2016-06-24: 14:00:00 via INTRAVENOUS
  Filled 2016-06-24: qty 1000

## 2016-06-24 MED ORDER — SODIUM CHLORIDE 0.9 % IV SOLN
200.0000 mg | Freq: Once | INTRAVENOUS | Status: AC
  Administered 2016-06-24: 200 mg via INTRAVENOUS
  Filled 2016-06-24: qty 10

## 2016-06-24 NOTE — Telephone Encounter (Signed)
Patient was not able to keep appointment for shared decision making visit and lung screening scan. She is rescheduled for 06/30/16 at 1:30pm.

## 2016-06-24 NOTE — Patient Instructions (Signed)
Iron Sucrose injection  What is this medicine?  IRON SUCROSE (AHY ern SOO krohs) is an iron complex. Iron is used to make healthy red blood cells, which carry oxygen and nutrients throughout the body. This medicine is used to treat iron deficiency anemia in people with chronic kidney disease.  This medicine may be used for other purposes; ask your health care provider or pharmacist if you have questions.  What should I tell my health care provider before I take this medicine?  They need to know if you have any of these conditions:  -anemia not caused by low iron levels  -heart disease  -high levels of iron in the blood  -kidney disease  -liver disease  -an unusual or allergic reaction to iron, other medicines, foods, dyes, or preservatives  -pregnant or trying to get pregnant  -breast-feeding  How should I use this medicine?  This medicine is for infusion into a vein. It is given by a health care professional in a hospital or clinic setting.  Talk to your pediatrician regarding the use of this medicine in children. While this drug may be prescribed for children as young as 2 years for selected conditions, precautions do apply.  Overdosage: If you think you have taken too much of this medicine contact a poison control center or emergency room at once.  NOTE: This medicine is only for you. Do not share this medicine with others.  What if I miss a dose?  It is important not to miss your dose. Call your doctor or health care professional if you are unable to keep an appointment.  What may interact with this medicine?  Do not take this medicine with any of the following medications:  -deferoxamine  -dimercaprol  -other iron products  This medicine may also interact with the following medications:  -chloramphenicol  -deferasirox  This list may not describe all possible interactions. Give your health care provider a list of all the medicines, herbs, non-prescription drugs, or dietary supplements you use. Also tell them if  you smoke, drink alcohol, or use illegal drugs. Some items may interact with your medicine.  What should I watch for while using this medicine?  Visit your doctor or healthcare professional regularly. Tell your doctor or healthcare professional if your symptoms do not start to get better or if they get worse. You may need blood work done while you are taking this medicine.  You may need to follow a special diet. Talk to your doctor. Foods that contain iron include: whole grains/cereals, dried fruits, beans, or peas, leafy green vegetables, and organ meats (liver, kidney).  What side effects may I notice from receiving this medicine?  Side effects that you should report to your doctor or health care professional as soon as possible:  -allergic reactions like skin rash, itching or hives, swelling of the face, lips, or tongue  -breathing problems  -changes in blood pressure  -cough  -fast, irregular heartbeat  -feeling faint or lightheaded, falls  -fever or chills  -flushing, sweating, or hot feelings  -joint or muscle aches/pains  -seizures  -swelling of the ankles or feet  -unusually weak or tired  Side effects that usually do not require medical attention (report to your doctor or health care professional if they continue or are bothersome):  -diarrhea  -feeling achy  -headache  -irritation at site where injected  -nausea, vomiting  -stomach upset  -tiredness  This list may not describe all possible side effects. Call your doctor   for medical advice about side effects. You may report side effects to FDA at 1-800-FDA-1088.  Where should I keep my medicine?  This drug is given in a hospital or clinic and will not be stored at home.  NOTE: This sheet is a summary. It may not cover all possible information. If you have questions about this medicine, talk to your doctor, pharmacist, or health care provider.      2016, [employer]. (2011-06-25 17:14:35)

## 2016-06-25 ENCOUNTER — Encounter: Payer: PPO | Admitting: Surgery

## 2016-06-25 DIAGNOSIS — S31105A Unspecified open wound of abdominal wall, periumbilic region without penetration into peritoneal cavity, initial encounter: Secondary | ICD-10-CM | POA: Diagnosis not present

## 2016-06-26 NOTE — Progress Notes (Signed)
Meagan Zavala, Meagan Zavala ([id number]) Visit Report for 06/25/2016 Arrival Information Details Patient Name: Meagan Zavala, [name] Date of Service: 06/25/2016 3:45 PM Medical Record Number: [id number] Patient Account Number: [id number] Date of Birth/Sex: [birth year] (70 y.o. Female) Treating RN: Montey Hora Primary Care Physician: Deborra Medina Other Clinician: Referring Physician: Deborra Medina Treating Physician/Extender: Frann Rider in Treatment: 10 Visit Information History Since Last Visit Added or deleted any medications: No Patient Arrived: Ambulatory Any new allergies or adverse reactions: No Arrival Time: 15:55 Had a fall or experienced change in No Accompanied By: self activities of daily living that may affect Transfer Assistance: None risk of falls: Patient Identification Verified: Yes Signs or symptoms of abuse/neglect since last No Secondary Verification Process Yes visito Completed: Hospitalized since last visit: No Patient Requires Transmission-Based No Pain Present Now: No Precautions: Patient Has Alerts: No Electronic Signature(s) Signed: 06/25/2016 4:40:32 PM By: Montey Hora Entered By: Montey Hora on 06/25/2016 16:00:41 Meagan Zavala ([id number]) -------------------------------------------------------------------------------- Clinic Level of Care Assessment Details Patient Name: Meagan Austin I. Date of Service: 06/25/2016 3:45 PM Medical Record Number: [id number] Patient Account Number: [id number] Date of Birth/Sex: 01/06/1946 (70 y.o. Female) Treating RN: Afful, RN, BSN, [place] Primary Care Physician: Deborra Medina Other Clinician: Referring Physician: Deborra Medina Treating Physician/Extender: Frann Rider in Treatment: 10 Clinic Level of Care Assessment Items TOOL 4 Quantity Score []  - Use when only an EandM is performed on FOLLOW-UP visit 0 ASSESSMENTS - Nursing Assessment / Reassessment X - Reassessment of Co-morbidities (includes  updates in patient status) 1 10 X - Reassessment of Adherence to Treatment Plan 1 5 ASSESSMENTS - Wound and Skin Assessment / Reassessment X - Simple Wound Assessment / Reassessment - one wound 1 5 []  - Complex Wound Assessment / Reassessment - multiple wounds 0 []  - Dermatologic / Skin Assessment (not related to wound area) 0 ASSESSMENTS - Focused Assessment []  - Circumferential Edema Measurements - multi extremities 0 []  - Nutritional Assessment / Counseling / Intervention 0 []  - Lower Extremity Assessment (monofilament, tuning fork, pulses) 0 []  - Peripheral Arterial Disease Assessment (using hand held doppler) 0 ASSESSMENTS - Ostomy and/or Continence Assessment and Care []  - Incontinence Assessment and Management 0 []  - Ostomy Care Assessment and Management (repouching, etc.) 0 PROCESS - Coordination of Care X - Simple Patient / Family Education for ongoing care 1 15 []  - Complex (extensive) Patient / Family Education for ongoing care 0 []  - Staff obtains Programmer, systems, Records, Test Results / Process Orders 0 []  - Staff telephones HHA, Nursing Homes / Clarify orders / etc 0 []  - Routine Transfer to another Facility (non-emergent condition) 0 Meagan Zavala, [name] ([id number]) []  - Routine Hospital Admission (non-emergent condition) 0 []  - New Admissions / Biomedical engineer / Ordering NPWT, Apligraf, etc. 0 []  - Emergency Hospital Admission (emergent condition) 0 []  - Simple Discharge Coordination 0 []  - Complex (extensive) Discharge Coordination 0 PROCESS - Special Needs []  - Pediatric / Minor Patient Management 0 []  - Isolation Patient Management 0 []  - Hearing / Language / Visual special needs 0 []  - Assessment of Community assistance (transportation, D/C planning, etc.) 0 []  - Additional assistance / Altered mentation 0 []  - Support Surface(s) Assessment (bed, cushion, seat, etc.) 0 INTERVENTIONS - Wound Cleansing / Measurement X - Simple Wound Cleansing - one wound 1 5 []  -  Complex Wound Cleansing - multiple wounds 0 X - Wound Imaging (photographs - any number of wounds) 1 5 []  - Wound Tracing (instead of photographs) 0 X -  Simple Wound Measurement - one wound 1 5 []  - Complex Wound Measurement - multiple wounds 0 INTERVENTIONS - Wound Dressings X - Small Wound Dressing one or multiple wounds 1 10 []  - Medium Wound Dressing one or multiple wounds 0 []  - Large Wound Dressing one or multiple wounds 0 []  - Application of Medications - topical 0 []  - Application of Medications - injection 0 INTERVENTIONS - Miscellaneous []  - External ear exam 0 Meagan Zavala, [name] ([id number]) []  - Specimen Collection (cultures, biopsies, blood, body fluids, etc.) 0 []  - Specimen(s) / Culture(s) sent or taken to Lab for analysis 0 []  - Patient Transfer (multiple staff / Harrel Lemon Lift / Similar devices) 0 []  - Simple Staple / Suture removal (25 or less) 0 []  - Complex Staple / Suture removal (26 or more) 0 []  - Hypo / Hyperglycemic Management (close monitor of Blood Glucose) 0 []  - Ankle / Brachial Index (ABI) - do not check if billed separately 0 X - Vital Signs 1 5 Has the patient been seen at the hospital within the last three years: Yes Total Score: 65 Level Of Care: New/Established - Level 2 Electronic Signature(s) Signed: 06/25/2016 5:32:18 PM By: Regan Lemming BSN, RN Entered By: Regan Lemming on 06/25/2016 16:15:29 Meagan Zavala ([id number]) -------------------------------------------------------------------------------- Encounter Discharge Information Details Patient Name: Meagan Austin I. Date of Service: 06/25/2016 3:45 PM Medical Record Number: [id number] Patient Account Number: [id number] Date of Birth/Sex: 12-17-1945 (70 y.o. Female) Treating RN: Montey Hora Primary Care Physician: Deborra Medina Other Clinician: Referring Physician: Deborra Medina Treating Physician/Extender: Frann Rider in Treatment: 10 Encounter Discharge Information Items Discharge  Pain Level: 0 Discharge Condition: Stable Ambulatory Status: Ambulatory Discharge Destination: Home Transportation: Private Auto Accompanied By: self Schedule Follow-up Appointment: Yes Medication Reconciliation completed and provided to Patient/Care No Jessejames Steelman: Provided on Clinical Summary of Care: 06/25/2016 Form Type Recipient Paper Patient [facility] Electronic Signature(s) Signed: 06/25/2016 4:24:34 PM By: Ruthine Dose Entered By: Ruthine Dose on 06/25/2016 16:24:33 Meagan Austin I. ([id number]) -------------------------------------------------------------------------------- Multi Wound Chart Details Patient Name: Meagan Austin I. Date of Service: 06/25/2016 3:45 PM Medical Record Number: [id number] Patient Account Number: [id number] Date of Birth/Sex: 1946-01-17 (70 y.o. Female) Treating RN: Baruch Gouty, RN, BSN, Velva Harman Primary Care Physician: Deborra Medina Other Clinician: Referring Physician: Deborra Medina Treating Physician/Extender: Frann Rider in Treatment: 10 Vital Signs Height(in): 61 Pulse(bpm): 78 Weight(lbs): 125 Blood Pressure 96/43 (mmHg): Body Mass Index(BMI): 24 Temperature(F): 98.2 Respiratory Rate 16 (breaths/min): Photos: [N/A:N/A] Wound Location: Right Abdomen - midline - N/A N/A Medial Wounding Event: Gradually Appeared N/A N/A Primary Etiology: Abscess N/A N/A Comorbid History: Confinement Anxiety N/A N/A Date Acquired: 02/27/2016 N/A N/A Weeks of Treatment: 10 N/A N/A Wound Status: Open N/A N/A Measurements L x W x D 0.2x0.5x0.1 N/A N/A (cm) Area (cm) : 0.079 N/A N/A Volume (cm) : 0.008 N/A N/A % Reduction in Area: 92.00% N/A N/A % Reduction in Volume: 97.30% N/A N/A Classification: Partial Thickness N/A N/A Exudate Amount: Large N/A N/A Exudate Type: Serous N/A N/A Exudate Color: amber N/A N/A Wound Margin: Flat and Intact N/A N/A Granulation Amount: Large (67-100%) N/A N/A Granulation Quality: Pink N/A N/A Necrotic Amount: Small  (1-33%) N/A N/A Exposed Structures: Fascia: No N/A N/A Fat: No Meagan Zavala, [name] ([id number]) Tendon: No Muscle: No Joint: No Bone: No Limited to Skin Breakdown Epithelialization: Small (1-33%) N/A N/A Periwound Skin Texture: Edema: No N/A N/A Excoriation: No Induration: No Callus: No Crepitus: No Fluctuance: No Friable: No Rash: No Scarring: No Periwound  Skin Moist: Yes N/A N/A Moisture: Maceration: No Dry/Scaly: No Periwound Skin Color: Atrophie Blanche: No N/A N/A Cyanosis: No Ecchymosis: No Erythema: No Hemosiderin Staining: No Mottled: No Pallor: No Rubor: No Temperature: No Abnormality N/A N/A Tenderness on No N/A N/A Palpation: Wound Preparation: Ulcer Cleansing: N/A N/A Rinsed/Irrigated with Saline Topical Anesthetic Applied: None Treatment Notes Electronic Signature(s) Signed: 06/25/2016 5:32:18 PM By: Regan Lemming BSN, RN Entered By: Regan Lemming on 06/25/2016 16:13:28 Meagan Zavala ([id number]) -------------------------------------------------------------------------------- [place] Details Patient Name: Meagan Austin I. Date of Service: 06/25/2016 3:45 PM Medical Record Number: [id number] Patient Account Number: [id number] Date of Birth/Sex: 1945-12-20 (70 y.o. Female) Treating RN: Afful, RN, BSN, Velva Harman Primary Care Physician: Deborra Medina Other Clinician: Referring Physician: Deborra Medina Treating Physician/Extender: Frann Rider in Treatment: 10 Active Inactive Malignancy/Atypical Etiology Nursing Diagnoses: Knowledge deficit related to disease process and management of atypical ulcer etiology Goals: Patient/caregiver will verbalize understanding of disease process and disease management of atypical ulcer etiology Date Initiated: 04/16/2016 Goal Status: Active Interventions: Assess patient and family medical history for signs and symptoms of malignancy/atypical etiology upon admission Provide education on  atypical ulcer etiologies Provide education on malignant ulcerations Treatment Activities: Biopsy for Pathology : 04/16/2016 Test ordered outside of clinic : 04/16/2016 Notes: Nutrition Nursing Diagnoses: Potential for alteratiion in Nutrition/Potential for imbalanced nutrition Goals: Patient/caregiver agrees to and verbalizes understanding of need to use nutritional supplements and/or vitamins as prescribed Date Initiated: 04/16/2016 Goal Status: Active Interventions: Assess patient nutrition upon admission and as needed per policy Provide education on nutrition Meagan Zavala, [name] ([id number]) Notes: Orientation to the Wound Care Program Nursing Diagnoses: Knowledge deficit related to the wound healing center program Goals: Patient/caregiver will verbalize understanding of the [place] Program Date Initiated: 04/16/2016 Goal Status: Active Interventions: Provide education on orientation to the wound center Notes: Wound/Skin Impairment Nursing Diagnoses: Impaired tissue integrity Goals: Patient/caregiver will verbalize understanding of skin care regimen Date Initiated: 04/16/2016 Goal Status: Active Ulcer/skin breakdown will heal within 14 weeks Date Initiated: 04/16/2016 Goal Status: Active Interventions: Assess patient/caregiver ability to obtain necessary supplies Assess patient/caregiver ability to perform ulcer/skin care regimen upon admission and as needed Assess ulceration(s) every visit Treatment Activities: Skin care regimen initiated : 04/16/2016 Topical wound management initiated : 04/16/2016 Notes: Electronic Signature(s) Signed: 06/25/2016 5:32:18 PM By: Regan Lemming BSN, RN Entered By: Regan Lemming on 06/25/2016 16:13:16 Meagan Austin I. ([id number]) -------------------------------------------------------------------------------- Pain Assessment Details Patient Name: Meagan Austin I. Date of Service: 06/25/2016 3:45 PM Medical Record Number:  [id number] Patient Account Number: [id number] Date of Birth/Sex: 1946/07/16 (70 y.o. Female) Treating RN: Montey Hora Primary Care Physician: Deborra Medina Other Clinician: Referring Physician: Deborra Medina Treating Physician/Extender: Frann Rider in Treatment: 10 Active Problems Location of Pain Severity and Description of Pain Patient Has Paino No Site Locations Pain Management and Medication Current Pain Management: Notes Topical or injectable lidocaine is offered to patient for acute pain when surgical debridement is performed. If needed, Patient is instructed to use over the counter pain medication for the following 24-48 hours after debridement. Wound care MDs do not prescribed pain medications. Patient has chronic pain or uncontrolled pain. Patient has been instructed to make an appointment with their Primary Care Physician for pain management. Electronic Signature(s) Signed: 06/25/2016 4:40:32 PM By: Montey Hora Entered By: Montey Hora on 06/25/2016 16:00:58 Meagan Zavala ([id number]) -------------------------------------------------------------------------------- Patient/Caregiver Education Details Patient Name: Meagan Austin I. Date of Service: 06/25/2016 3:45 PM Medical Record Number: [id number] Patient Account Number:  [id number] Date of Birth/Gender: 09-Apr-1946 (70 y.o. Female) Treating RN: Montey Hora Primary Care Physician: Deborra Medina Other Clinician: Referring Physician: Deborra Medina Treating Physician/Extender: Frann Rider in Treatment: 10 Education Assessment Education Provided To: Patient Education Topics Provided Wound/Skin Impairment: Handouts: Other: wound care as ordered Methods: Explain/Verbal Responses: State content correctly Electronic Signature(s) Signed: 06/25/2016 4:40:32 PM By: Montey Hora Entered By: Montey Hora on 06/25/2016 16:21:55 Meagan Austin I.  ([id number]) -------------------------------------------------------------------------------- Wound Assessment Details Patient Name: Meagan Austin I. Date of Service: 06/25/2016 3:45 PM Medical Record Number: [id number] Patient Account Number: [id number] Date of Birth/Sex: 1946-03-25 (70 y.o. Female) Treating RN: Montey Hora Primary Care Physician: Deborra Medina Other Clinician: Referring Physician: Deborra Medina Treating Physician/Extender: Frann Rider in Treatment: 10 Wound Status Wound Number: 1 Primary Etiology: Abscess Wound Location: Right Abdomen - midline - Wound Status: Open Medial Comorbid History: Confinement Anxiety Wounding Event: Gradually Appeared Date Acquired: 02/27/2016 Weeks Of Treatment: 10 Clustered Wound: No Photos Wound Measurements Length: (cm) 0.2 Width: (cm) 0.5 Depth: (cm) 0.1 Area: (cm) 0.079 Volume: (cm) 0.008 % Reduction in Area: 92% % Reduction in Volume: 97.3% Epithelialization: Small (1-33%) Tunneling: No Undermining: No Wound Description Classification: Partial Thickness Wound Margin: Flat and Intact Exudate Amount: Large Exudate Type: Serous Exudate Color: amber Foul Odor After Cleansing: No Wound Bed Granulation Amount: Large (67-100%) Exposed Structure Granulation Quality: Pink Fascia Exposed: No Necrotic Amount: Small (1-33%) Fat Layer Exposed: No Necrotic Quality: Adherent Slough Tendon Exposed: No Muscle Exposed: No Joint Exposed: No Bone Exposed: No Meagan Zavala, [name] ([id number]) Limited to Skin Breakdown Periwound Skin Texture Texture Color No Abnormalities Noted: No No Abnormalities Noted: No Callus: No Atrophie Blanche: No Crepitus: No Cyanosis: No Excoriation: No Ecchymosis: No Fluctuance: No Erythema: No Friable: No Hemosiderin Staining: No Induration: No Mottled: No Localized Edema: No Pallor: No Rash: No Rubor: No Scarring: No Temperature / Pain Moisture Temperature: No Abnormality No  Abnormalities Noted: No Dry / Scaly: No Maceration: No Moist: Yes Wound Preparation Ulcer Cleansing: Rinsed/Irrigated with Saline Topical Anesthetic Applied: None Treatment Notes Wound #1 (Right, Medial Abdomen - midline) 1. Cleansed with: Clean wound with Normal Saline 2. Anesthetic Topical Lidocaine 4% cream to wound bed prior to debridement 3. Peri-wound Care: Skin Prep 4. Dressing Applied: Prisma Ag 5. Secondary Dressing Applied Bordered Foam Dressing Dry Gauze Electronic Signature(s) Signed: 06/25/2016 4:40:32 PM By: Montey Hora Entered By: Montey Hora on 06/25/2016 16:06:36 Meagan Zavala ([id number]) -------------------------------------------------------------------------------- Vitals Details Patient Name: Meagan Austin I. Date of Service: 06/25/2016 3:45 PM Medical Record Number: [id number] Patient Account Number: [id number] Date of Birth/Sex: 1946/08/17 (70 y.o. Female) Treating RN: Montey Hora Primary Care Physician: Deborra Medina Other Clinician: Referring Physician: Deborra Medina Treating Physician/Extender: Frann Rider in Treatment: 10 Vital Signs Time Taken: 16:00 Temperature (F): 98.2 Height (in): 61 Pulse (bpm): 78 Weight (lbs): 125 Respiratory Rate (breaths/min): 16 Body Mass Index (BMI): 23.6 Blood Pressure (mmHg): 96/43 Reference Range: 80 - 120 mg / dl Electronic Signature(s) Signed: 06/25/2016 4:40:32 PM By: Montey Hora Entered By: Montey Hora on 06/25/2016 16:02:48

## 2016-06-26 NOTE — Progress Notes (Signed)
ARIENA, MOSSHOLDER ([id number]) Visit Report for 06/25/2016 Chief Complaint Document Details Patient Name: Meagan, FELVER Zavala. Date of Service: 06/25/2016 3:45 PM Medical Record Number: [id number] Patient Account Number: [id number] Date of Birth/Sex: April 11, 1946 (70 y.o. Female) Treating RN: Montey Hora Primary Care Physician: Deborra Medina Other Clinician: Referring Physician: Deborra Medina Treating Physician/Extender: Frann Rider in Treatment: 10 Information Obtained from: Patient Chief Complaint Patient seen for complaints of Non-Healing Wound to the right of from licorice on her abdominal wall for about a month Electronic Signature(s) Signed: 06/25/2016 4:16:48 PM By: Christin Fudge MD, FACS Entered By: Christin Fudge on 06/25/2016 16:16:47 Meagan Zavala ([id number]) -------------------------------------------------------------------------------- HPI Details Patient Name: Meagan Zavala. Date of Service: 06/25/2016 3:45 PM Medical Record Number: [id number] Patient Account Number: [id number] Date of Birth/Sex: [birth year] (70 y.o. Female) Treating RN: Montey Hora Primary Care Physician: Deborra Medina Other Clinician: Referring Physician: Deborra Medina Treating Physician/Extender: Frann Rider in Treatment: 10 History of Present Illness Location: umbilical region of the abdominal wall Quality: an open ulcerated area some amount of pain and burning Severity: Patient states wound are getting worse. Duration: Patient has had the wound for < 4 weeks prior to presenting for treatment Timing: Pain in wound is Intermittent (comes and goes Context: The wound appeared gradually over time Modifying Factors: Other treatment(s) tried include:and doxycycline the present time Associated Signs and Symptoms: Patient reports having increase discharge. HPI Description: 70 year old patient who was recently seen by Dr. Tommi Rumps was noted to have a small ulceration on abdomen  wall. the patient has had it for about 6 weeks in the periumbilical areaThe patient noted that it has become slightly larger and draining purulent material.was started on doxycycline and given an appointment to the wound center. She does not recall if she had any insect bite or injury her past medical history significant for GERD, hypothyroidism, osteoporosis, chronic kidney disease stage III, chronic low back pain, anemia, vitamin D deficiency and urge incontinence. 7/27//2017 -- pathology report of specimen done -- DIAGNOSIS: A. SKIN, ABDOMEN; BIOPSY: - CHRONIC ULCER. - NEGATIVE FOR MALIGNANCY Electronic Signature(s) Signed: 06/25/2016 4:16:53 PM By: Christin Fudge MD, FACS Entered By: Christin Fudge on 06/25/2016 16:16:53 Meagan Zavala ([id number]) -------------------------------------------------------------------------------- Physical Exam Details Patient Name: Meagan Zavala. Date of Service: 06/25/2016 3:45 PM Medical Record Number: [id number] Patient Account Number: [id number] Date of Birth/Sex: 05/01/1946 (70 y.o. Female) Treating RN: Montey Hora Primary Care Physician: Deborra Medina Other Clinician: Referring Physician: Deborra Medina Treating Physician/Extender: Frann Rider in Treatment: 10 Constitutional . Pulse regular. Respirations normal and unlabored. Afebrile. . Eyes Nonicteric. Reactive to light. Ears, Nose, Mouth, and Throat Lips, teeth, and gums WNL.Marland Kitchen Moist mucosa without lesions. Neck supple and nontender. No palpable supraclavicular or cervical adenopathy. Normal sized without goiter. Respiratory WNL. No retractions.. Breath sounds WNL, No rubs, rales, rhonchi, or wheeze.. Cardiovascular Heart rhythm and rate regular, no murmur or gallop.. Pedal Pulses WNL. No clubbing, cyanosis or edema. Chest Breasts symmetical and no nipple discharge.. Breast tissue WNL, no masses, lumps, or tenderness.. Lymphatic No adneopathy. No adenopathy. No  adenopathy. Musculoskeletal Adexa without tenderness or enlargement.. Digits and nails w/o clubbing, cyanosis, infection, petechiae, ischemia, or inflammatory conditions.. Integumentary (Hair, Skin) No suspicious lesions. No crepitus or fluctuance. No peri-wound warmth or erythema. No masses.Marland Kitchen Psychiatric Judgement and insight Intact.. No evidence of depression, anxiety, or agitation.. Notes there is healthy granulation tissue and minimal open area and no sharp debridement was required today. Electronic Signature(s) Signed: 06/25/2016 4:17:15 PM By:  Christin Fudge MD, FACS Entered By: Christin Fudge on 06/25/2016 16:17:15 Meagan Zavala ([id number]) -------------------------------------------------------------------------------- Physician Orders Details Patient Name: Meagan Zavala. Date of Service: 06/25/2016 3:45 PM Medical Record Patient Account Number: [id number] Number: Afful, RN, BSN, Treating RN: 1946-03-21 [phone] y.o. Meagan Zavala Date of Birth/Sex: Female) Other Clinician: Primary Care Physician: Deborra Medina Treating Christin Fudge Referring Physician: Deborra Medina Physician/Extender: Suella Grove in Treatment: 10 Verbal / Phone Orders: Yes Clinician: Afful, RN, BSN, Rita Read Back and Verified: Yes Diagnosis Coding Wound Cleansing Wound #1 Right,Medial Abdomen - midline o Clean wound with Normal Saline. Anesthetic Wound #1 Right,Medial Abdomen - midline o Topical Lidocaine 4% cream applied to wound bed prior to debridement o Injected 2% Lidocaine without epinephrine prior to debridement Primary Wound Dressing Wound #1 Right,Medial Abdomen - midline o Prisma Ag Secondary Dressing Wound #1 Right,Medial Abdomen - midline o Dry Gauze o Boardered Foam Dressing Dressing Change Frequency Wound #1 Right,Medial Abdomen - midline o Change dressing every day. Follow-up Appointments Wound #1 Right,Medial Abdomen - midline o Return Appointment in 1  week. Additional Orders / Instructions Wound #1 Right,Medial Abdomen - midline o Increase protein intake. o Activity as tolerated o Other: - Vitamin C, A, MVI, ZINC LEXEY, CADAVID ([id number]) Electronic Signature(s) Signed: 06/25/2016 4:25:16 PM By: Christin Fudge MD, FACS Signed: 06/25/2016 5:32:18 PM By: Regan Lemming BSN, RN Entered By: Regan Lemming on 06/25/2016 16:14:43 Meagan Zavala ([id number]) -------------------------------------------------------------------------------- Problem List Details Patient Name: Meagan Zavala. Date of Service: 06/25/2016 3:45 PM Medical Record Number: [id number] Patient Account Number: [id number] Date of Birth/Sex: 1946/01/21 (70 y.o. 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 Date of Service: 06/25/2016 3:45 PM Medical Record Number: [id number] Patient Account Number: [id number] Date of Birth/Sex: November 21, 1945 (70 y.o. Female) Treating RN: Montey Hora Primary Care Physician: Deborra Medina Other Clinician: Referring Physician: Deborra Medina Treating Physician/Extender: Frann Rider  in Treatment: 10 Subjective Chief Complaint Information obtained from Patient Patient seen for complaints of Non-Healing Wound to the right of from licorice on her abdominal wall for about a month History of Present Illness (HPI) The following HPI elements were documented for the patient's wound: Location: umbilical region of the abdominal wall Quality: an open ulcerated area some amount of pain and burning Severity: Patient states wound are getting worse. Duration: Patient has had the wound for < 4 weeks prior to presenting for treatment Timing: Pain in wound is Intermittent (comes and goes Context: The wound appeared gradually over time Modifying Factors: Other treatment(s) tried include:and doxycycline the present time Associated Signs and Symptoms: Patient reports having increase discharge. 70 year old patient who was recently seen by Dr. Tommi Rumps was noted to have a small ulceration on abdomen wall. the patient has had it for about 6 weeks in the periumbilical areaThe patient noted that it has become slightly larger and draining purulent material.was started on doxycycline and given an appointment to the wound  center. She does not recall if she had any insect bite or injury her past medical history significant for GERD, hypothyroidism, osteoporosis, chronic kidney disease stage III, chronic low back pain, anemia, vitamin D deficiency and urge incontinence. 7/27//2017 -- pathology report of specimen done -- DIAGNOSIS: A. SKIN, ABDOMEN; BIOPSY: - CHRONIC ULCER. - NEGATIVE FOR MALIGNANCY Objective Constitutional Pulse regular. Respirations normal and unlabored. Afebrile. Vitals Time Taken: 4:00 PM, Height: 61 in, Weight: 125 lbs, BMI: 23.6, Temperature: 98.2 F, Pulse: 78 bpm, Respiratory Rate: 16 breaths/min, Blood Pressure: 96/43 mmHg. Meagan, Meagan Zavala. ([id number]) Eyes Nonicteric. Reactive to light. Ears, Nose, Mouth, and Throat Lips, teeth, and gums WNL.Marland Kitchen Moist mucosa  without lesions. Neck supple and nontender. No palpable supraclavicular or cervical adenopathy. Normal sized without goiter. Respiratory WNL. No retractions.. Breath sounds WNL, No rubs, rales, rhonchi, or wheeze.. Cardiovascular Heart rhythm and rate regular, no murmur or gallop.. Pedal Pulses WNL. No clubbing, cyanosis or edema. Chest Breasts symmetical and no nipple discharge.. Breast tissue WNL, no masses, lumps, or tenderness.. Lymphatic No adneopathy. No adenopathy. No adenopathy. Musculoskeletal Adexa without tenderness or enlargement.. Digits and nails w/o clubbing, cyanosis, infection, petechiae, ischemia, or inflammatory conditions.Marland Kitchen Psychiatric Judgement and insight Intact.. No evidence of depression, anxiety, or agitation.. General Notes: there is healthy granulation tissue and minimal open area and no sharp debridement was required today. Integumentary (Hair, Skin) No suspicious lesions. No crepitus or fluctuance. No peri-wound warmth or erythema. No masses.. Wound #1 status is Open. Original cause of wound was Gradually Appeared. The wound is located on the Right,Medial Abdomen - midline. The wound measures 0.2cm length x 0.5cm width x 0.1cm depth; 0.079cm^2 area and 0.008cm^3 volume. The wound is limited to skin breakdown. There is no tunneling or undermining noted. There is a large amount of serous drainage noted. The wound margin is flat and intact. There is large (67-100%) pink granulation within the wound bed. There is a small (1-33%) amount of necrotic tissue within the wound bed including Adherent Slough. The periwound skin appearance exhibited: Moist. The periwound skin appearance did not exhibit: Callus, Crepitus, Excoriation, Fluctuance, Friable, Induration, Localized Edema, Rash, Scarring, Dry/Scaly, Maceration, Atrophie Blanche, Cyanosis, Ecchymosis, Hemosiderin Staining, Mottled, Pallor, Rubor, Erythema. Periwound temperature was noted as No  Abnormality. Meagan, Meagan Zavala. ([id number]) Assessment Active Problems ICD-10 S31.105A - Unspecified open wound of abdominal wall, periumbilic region without penetration into peritoneal cavity, initial encounter L02.221 - Furuncle of abdominal wall Plan Wound Cleansing: Wound #1 Right,Medial Abdomen - midline: Clean wound with Normal Saline. Anesthetic: Wound #1 Right,Medial Abdomen - midline: Topical Lidocaine 4% cream applied to wound bed prior to debridement Injected 2% Lidocaine without epinephrine prior to debridement Primary Wound Dressing: Wound #1 Right,Medial Abdomen - midline: Prisma Ag Secondary Dressing: Wound #1 Right,Medial Abdomen - midline: Dry Gauze Boardered Foam Dressing Dressing Change Frequency: Wound #1 Right,Medial Abdomen - midline: Change dressing every day. Follow-up Appointments: Wound #1 Right,Medial Abdomen - midline: Return Appointment in 1 week. Additional Orders / Instructions: Wound #1 Right,Medial Abdomen - midline: Increase protein intake. Activity as tolerated Other: - Vitamin C, A, MVI, ZINC Meagan Zavala, Meagan Zavala. ([id number]) The wound is looking very clean and Zavala have recommended we continue with Prisma AG on alternate days and offload this area as much as possible. Zavala have asked her to spread her visits to every 2 week if she so desires. Zavala anticipate discharge soon Electronic Signature(s) Signed: 06/25/2016 4:18:26 PM By: Christin Fudge MD, FACS Previous Signature: 06/25/2016 4:17:26 PM Version By: Con Memos  Roderick Pee MD, FACS Entered By: Christin Fudge on 06/25/2016 16:18:26 Meagan Zavala ([id number]) -------------------------------------------------------------------------------- SuperBill Details Patient Name: Meagan Zavala. Date of Service: 06/25/2016 Medical Record Number: [id number] Patient Account Number: [id number] Date of Birth/Sex: 05/04/1946 (70 y.o. Female) Treating RN: Montey Hora Primary Care Physician: Deborra Medina Other  Clinician: Referring Physician: Deborra Medina Treating Physician/Extender: Frann Rider in Treatment: 10 Diagnosis Coding ICD-10 Codes Code Description Unspecified open wound of abdominal wall, periumbilic region without penetration into S31.105A peritoneal cavity, initial encounter 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 Facility Procedures CPT4 Code: [id number] Description: [phone] - WOUND CARE VISIT-LEV 2 EST PT Modifier: Quantity: 1 Physician Procedures CPT4: Description Modifier Quantity Code [id number] - WC PHYS LEVEL 3 - EST PT 1 ICD-10 Description Diagnosis S31.105A Unspecified open wound of abdominal wall, periumbilic region without penetration into peritoneal cavity, initial encounter L02.221  Furuncle of abdominal wall Electronic Signature(s) Signed: 06/25/2016 4:17:41 PM By: Christin Fudge MD, FACS Entered By: Christin Fudge on 06/25/2016 16:17:41

## 2016-06-30 ENCOUNTER — Inpatient Hospital Stay: Payer: PPO | Attending: Internal Medicine

## 2016-06-30 ENCOUNTER — Inpatient Hospital Stay: Payer: PPO

## 2016-06-30 VITALS — BP 106/62 | HR 69 | Temp 96.8°F | Resp 18

## 2016-06-30 DIAGNOSIS — N183 Chronic kidney disease, stage 3 (moderate): Secondary | ICD-10-CM | POA: Diagnosis not present

## 2016-06-30 DIAGNOSIS — M545 Low back pain: Secondary | ICD-10-CM | POA: Diagnosis not present

## 2016-06-30 DIAGNOSIS — Z85828 Personal history of other malignant neoplasm of skin: Secondary | ICD-10-CM | POA: Diagnosis not present

## 2016-06-30 DIAGNOSIS — Z800 Family history of malignant neoplasm of digestive organs: Secondary | ICD-10-CM | POA: Diagnosis not present

## 2016-06-30 DIAGNOSIS — R0602 Shortness of breath: Secondary | ICD-10-CM | POA: Diagnosis not present

## 2016-06-30 DIAGNOSIS — R5383 Other fatigue: Secondary | ICD-10-CM | POA: Insufficient documentation

## 2016-06-30 DIAGNOSIS — G8929 Other chronic pain: Secondary | ICD-10-CM | POA: Diagnosis not present

## 2016-06-30 DIAGNOSIS — D500 Iron deficiency anemia secondary to blood loss (chronic): Secondary | ICD-10-CM | POA: Insufficient documentation

## 2016-06-30 DIAGNOSIS — M810 Age-related osteoporosis without current pathological fracture: Secondary | ICD-10-CM | POA: Diagnosis not present

## 2016-06-30 DIAGNOSIS — K219 Gastro-esophageal reflux disease without esophagitis: Secondary | ICD-10-CM | POA: Diagnosis not present

## 2016-06-30 DIAGNOSIS — H3530 Unspecified macular degeneration: Secondary | ICD-10-CM | POA: Diagnosis not present

## 2016-06-30 DIAGNOSIS — Z801 Family history of malignant neoplasm of trachea, bronchus and lung: Secondary | ICD-10-CM | POA: Insufficient documentation

## 2016-06-30 DIAGNOSIS — Z7982 Long term (current) use of aspirin: Secondary | ICD-10-CM | POA: Diagnosis not present

## 2016-06-30 DIAGNOSIS — E7800 Pure hypercholesterolemia, unspecified: Secondary | ICD-10-CM | POA: Insufficient documentation

## 2016-06-30 DIAGNOSIS — Z87891 Personal history of nicotine dependence: Secondary | ICD-10-CM | POA: Insufficient documentation

## 2016-06-30 DIAGNOSIS — Z79899 Other long term (current) drug therapy: Secondary | ICD-10-CM | POA: Diagnosis not present

## 2016-06-30 DIAGNOSIS — Z9884 Bariatric surgery status: Secondary | ICD-10-CM | POA: Insufficient documentation

## 2016-06-30 DIAGNOSIS — Z803 Family history of malignant neoplasm of breast: Secondary | ICD-10-CM | POA: Insufficient documentation

## 2016-06-30 DIAGNOSIS — M1990 Unspecified osteoarthritis, unspecified site: Secondary | ICD-10-CM | POA: Insufficient documentation

## 2016-06-30 LAB — IRON AND TIBC
IRON: 36 ug/dL (ref 28–170)
Saturation Ratios: 9 % — ABNORMAL LOW (ref 10.4–31.8)
TIBC: 384 ug/dL (ref 250–450)
UIBC: 348 ug/dL

## 2016-06-30 LAB — CBC WITH DIFFERENTIAL/PLATELET
BASOS ABS: 0.1 10*3/uL (ref 0–0.1)
BASOS PCT: 1 %
EOS ABS: 0.1 10*3/uL (ref 0–0.7)
Eosinophils Relative: 2 %
HCT: 30.1 % — ABNORMAL LOW (ref 35.0–47.0)
Hemoglobin: 9.5 g/dL — ABNORMAL LOW (ref 12.0–16.0)
Lymphocytes Relative: 15 %
Lymphs Abs: 1.1 10*3/uL (ref 1.0–3.6)
MCH: 26.1 pg (ref 26.0–34.0)
MCHC: 31.6 g/dL — AB (ref 32.0–36.0)
MCV: 82.6 fL (ref 80.0–100.0)
MONO ABS: 0.6 10*3/uL (ref 0.2–0.9)
MONOS PCT: 8 %
Neutro Abs: 5.7 10*3/uL (ref 1.4–6.5)
Neutrophils Relative %: 74 %
PLATELETS: 230 10*3/uL (ref 150–440)
RBC: 3.65 MIL/uL — ABNORMAL LOW (ref 3.80–5.20)
RDW: 20 % — AB (ref 11.5–14.5)
WBC: 7.6 10*3/uL (ref 3.6–11.0)

## 2016-06-30 LAB — FERRITIN: FERRITIN: 121 ng/mL (ref 11–307)

## 2016-06-30 MED ORDER — SODIUM CHLORIDE 0.9 % IV SOLN
200.0000 mg | Freq: Once | INTRAVENOUS | Status: AC
  Administered 2016-06-30: 200 mg via INTRAVENOUS
  Filled 2016-06-30: qty 10

## 2016-06-30 MED ORDER — SODIUM CHLORIDE 0.9 % IV SOLN
Freq: Once | INTRAVENOUS | Status: AC
  Administered 2016-06-30: 14:00:00 via INTRAVENOUS
  Filled 2016-06-30: qty 1000

## 2016-07-01 ENCOUNTER — Inpatient Hospital Stay: Payer: PPO

## 2016-07-02 ENCOUNTER — Ambulatory Visit
Admission: RE | Admit: 2016-07-02 | Discharge: 2016-07-02 | Disposition: A | Discharge status: Home / Self Care | Payer: PPO | Source: Ambulatory Visit | Attending: Internal Medicine | Admitting: Internal Medicine

## 2016-07-02 ENCOUNTER — Ambulatory Visit: Payer: PPO | Admitting: General Surgery

## 2016-07-02 DIAGNOSIS — Z1231 Encounter for screening mammogram for malignant neoplasm of breast: Secondary | ICD-10-CM | POA: Insufficient documentation

## 2016-07-02 DIAGNOSIS — Z1239 Encounter for other screening for malignant neoplasm of breast: Secondary | ICD-10-CM

## 2016-07-07 ENCOUNTER — Other Ambulatory Visit: Payer: Self-pay | Admitting: *Deleted

## 2016-07-07 ENCOUNTER — Other Ambulatory Visit: Payer: PPO

## 2016-07-07 ENCOUNTER — Inpatient Hospital Stay (HOSPITAL_BASED_OUTPATIENT_CLINIC_OR_DEPARTMENT_OTHER): Payer: PPO | Admitting: Internal Medicine

## 2016-07-07 ENCOUNTER — Ambulatory Visit
Admission: RE | Admit: 2016-07-07 | Discharge: 2016-07-07 | Disposition: A | Discharge status: Home / Self Care | Payer: Self-pay | Source: Ambulatory Visit | Attending: *Deleted | Admitting: *Deleted

## 2016-07-07 ENCOUNTER — Inpatient Hospital Stay: Payer: PPO

## 2016-07-07 VITALS — BP 93/56 | HR 73 | Temp 97.6°F | Resp 18

## 2016-07-07 DIAGNOSIS — M1990 Unspecified osteoarthritis, unspecified site: Secondary | ICD-10-CM

## 2016-07-07 DIAGNOSIS — G8929 Other chronic pain: Secondary | ICD-10-CM

## 2016-07-07 DIAGNOSIS — N183 Chronic kidney disease, stage 3 (moderate): Secondary | ICD-10-CM

## 2016-07-07 DIAGNOSIS — H3530 Unspecified macular degeneration: Secondary | ICD-10-CM

## 2016-07-07 DIAGNOSIS — D500 Iron deficiency anemia secondary to blood loss (chronic): Secondary | ICD-10-CM

## 2016-07-07 DIAGNOSIS — M810 Age-related osteoporosis without current pathological fracture: Secondary | ICD-10-CM

## 2016-07-07 DIAGNOSIS — Z9884 Bariatric surgery status: Secondary | ICD-10-CM

## 2016-07-07 DIAGNOSIS — R5383 Other fatigue: Secondary | ICD-10-CM | POA: Diagnosis not present

## 2016-07-07 DIAGNOSIS — K219 Gastro-esophageal reflux disease without esophagitis: Secondary | ICD-10-CM

## 2016-07-07 DIAGNOSIS — Z79899 Other long term (current) drug therapy: Secondary | ICD-10-CM

## 2016-07-07 DIAGNOSIS — Z87891 Personal history of nicotine dependence: Secondary | ICD-10-CM

## 2016-07-07 DIAGNOSIS — M545 Low back pain: Secondary | ICD-10-CM

## 2016-07-07 DIAGNOSIS — R0602 Shortness of breath: Secondary | ICD-10-CM

## 2016-07-07 DIAGNOSIS — Z9289 Personal history of other medical treatment: Secondary | ICD-10-CM

## 2016-07-07 DIAGNOSIS — Z7982 Long term (current) use of aspirin: Secondary | ICD-10-CM

## 2016-07-07 DIAGNOSIS — E7800 Pure hypercholesterolemia, unspecified: Secondary | ICD-10-CM

## 2016-07-07 DIAGNOSIS — Z85828 Personal history of other malignant neoplasm of skin: Secondary | ICD-10-CM

## 2016-07-07 MED ORDER — IRON SUCROSE 20 MG/ML IV SOLN
200.0000 mg | Freq: Once | INTRAVENOUS | Status: AC
  Administered 2016-07-07: 200 mg via INTRAVENOUS
  Filled 2016-07-07: qty 10

## 2016-07-07 MED ORDER — SODIUM CHLORIDE 0.9 % IV SOLN
Freq: Once | INTRAVENOUS | Status: AC
Start: 2016-07-07 — End: 2016-07-07
  Administered 2016-07-07: 15:00:00 via INTRAVENOUS
  Filled 2016-07-07: qty 1000

## 2016-07-07 NOTE — Progress Notes (Signed)
[facility] NOTE  Patient Care Team: Crecencio Mc, MD as PCP - General (Internal Medicine)  CHIEF COMPLAINTS/PURPOSE OF CONSULTATION:   # IRON DEFICIENCY ANEMIA [SEP 2017-Dr.Elliot-EGD/Colo-Negative; Stool occult Positive]  # "Gastric Bypass"  No history exists.     HISTORY OF PRESENTING ILLNESS:  Meagan Zavala 70 y.o.  female iron deficiency anemia unclear etiology- currently status post IV iron 3 is here for follow-up  Patient energy levels have improved. However she continues to still feels fatigued.   Patient has poor by mouth tolerance to iron. Patient denies any gross blood in stools. She complains of shortness of breath patient complains of fatigue. No swelling in the legs.   ROS: A complete 10 point review of system is done which is negative except mentioned above in history of present illness  MEDICAL HISTORY:  Past Medical History:  Diagnosis Date  . Anemia   . Arthritis   . Basal cell carcinoma of face   . BULIMIA many decades of this   patient keeps alive by drinking ensure that she cannot purge  . GERD (gastroesophageal reflux disease)   . Hypercholesteremia   . KIDNEY DISEASE, CHRONIC, STAGE III 2010   Cr 1.3 -> .84, probably from NSAID's and lasix  . LEAKAGE, CONTINUOUS URINE 07/30/2009  . LOW BACK PAIN, CHRONIC    takes tylenol  . Macular degeneration disease    [place], now [place]  . Major depressive disorder, recurrent episode, severe (Onset)    well treated with SSRI's  . OSTEOPOROSIS    severe, treated with infusions, followed by endocrine in [place]  . Wears dentures    full upper and lower    SURGICAL HISTORY: Past Surgical History:  Procedure Laterality Date  . CARPAL TUNNEL RELEASE    . CATARACT EXTRACTION    . COLON SURGERY  1980   partial colectomy  . COLONOSCOPY WITH PROPOFOL N/A 06/10/2016   Procedure: COLONOSCOPY WITH PROPOFOL;  Surgeon: Manya Silvas, MD;  Location: [facility] ENDOSCOPY;  Service:  Endoscopy;  Laterality: N/A;  . ECTROPION REPAIR Bilateral 08/13/2015   Procedure: REPAIR OF ECTROPION;  Surgeon: Karle Starch, MD;  Location: [facility];  Service: Ophthalmology;  Laterality: Bilateral;  . ESOPHAGOGASTRODUODENOSCOPY (EGD) WITH PROPOFOL N/A 06/10/2016   Procedure: ESOPHAGOGASTRODUODENOSCOPY (EGD) WITH PROPOFOL;  Surgeon: Manya Silvas, MD;  Location: [facility] ENDOSCOPY;  Service: Endoscopy;  Laterality: N/A;  . 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 PTOSIS REPAIR Bilateral 08/13/2015   Procedure: PTOSIS REPAIR;  Surgeon: Karle Starch, MD;  Location: [facility];  Service: Ophthalmology;  Laterality: Bilateral;  . SURGICAL EXCISION OF EXCESSIVE SKIN  1979  . TUBAL LIGATION      SOCIAL HISTORY: Social History   Social History  . Marital status: Single    Spouse name: N/A  . Number of children: N/A  . Years of education: N/A   Occupational History  . Not on file.   Social History Main Topics  . Smoking status: Former Smoker    Packs/day: 1.50    Years: 40.00    Quit date: 10/02/2010  . Smokeless tobacco: Never Used  . 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 Drug use: No  . Sexual activity: Not on file   Other Topics Concern  . Not on file   Social History Narrative   Patient has been living alone.  She is on disability. She goes to church and has support there. She has a very supportive cousin, Zorka Savarese: [phone].    FAMILY HISTORY: Family History  Problem Relation Age of Onset  . Cancer Mother     breast,   . Breast cancer Mother   . Cancer Father     lung  . Cancer Sister     lung, brain  . Cancer Brother     throat    ALLERGIES:  has No Known Allergies.  MEDICATIONS:  Current Outpatient Prescriptions  Medication Sig Dispense Refill  . aspirin 81 MG tablet Take 1 tablet (81 mg total) by mouth daily. 30 tablet 11  . calcium citrate-vitamin D 200-200 MG-UNIT TABS  Take 3 tablets by mouth daily.     . clonazePAM (KLONOPIN) 0.5 MG tablet 0.25 mg 2 (two) times daily as needed.   0  . Cyanocobalamin (RA VITAMIN B-12 TR) 1000 MCG TBCR Take 1 tablet by mouth daily.     Marland Kitchen docusate sodium (COLACE) 100 MG capsule Take 100 mg by mouth 2 (two) times daily.    Marland Kitchen donepezil (ARICEPT) 10 MG tablet Take 10 mg by mouth.    . fish oil-omega-3 fatty acids 1000 MG capsule Take 1 capsule by mouth daily.    Marland Kitchen FLUoxetine HCl 60 MG TABS TAKE ONE TABLET BY MOUTH ONCE DAILY 90 tablet 1  . HYDROcodone-acetaminophen (NORCO) 10-325 MG tablet Take 1 tablet by mouth 2 (two) times daily. As needed for severe pain 60 tablet 0  . mirtazapine (REMERON) 15 MG tablet Take 1 tablet (15 mg total) by mouth at bedtime. 90 tablet 1  . Multiple Vitamins-Minerals (ICAPS PO) Take by mouth.    Marland Kitchen omeprazole (PRILOSEC) 40 MG capsule TAKE ONE CAPSULE BY MOUTH ONCE DAILY 90 capsule 2  . oxybutynin (DITROPAN XL) 15 MG 24 hr tablet Take 1 tablet (15 mg total) by mouth at bedtime. 90 tablet 2  . pravastatin (PRAVACHOL) 20 MG tablet TAKE ONE TABLET BY MOUTH ONCE DAILY 90 tablet 3  . SYNTHROID 25 MCG tablet Take 25 mcg by mouth daily before breakfast.     . traZODone (DESYREL) 50 MG tablet TAKE ONE TO TWO TABLETS BY MOUTH ONCE DAILY 180 tablet 0  . Vitamin D, Ergocalciferol, (DRISDOL) 50000 UNITS CAPS Take 50,000 Units by mouth every 14 (fourteen) days.    . vitamin E 400 UNIT capsule Take 400 Units by mouth daily. Reported on 04/07/2016    . zoledronic acid (RECLAST) 5 MG/100ML SOLN Every year.     No current facility-administered medications for this visit.       Marland Kitchen  PHYSICAL EXAMINATION: ECOG PERFORMANCE STATUS: 1 - Symptomatic but completely ambulatory  Vitals:   07/07/16 1345  BP: (!) 93/56  Pulse: 73  Resp: 18  Temp: 97.6 F (36.4 C)   There were no vitals filed for this visit.  GENERAL: Well-nourished well-developed; Alert, no distress and comfortable.  Alone.  EYES: no pallor or  icterus OROPHARYNX: no thrush or ulceration; good dentition  NECK: supple, no masses felt LYMPH:  no palpable lymphadenopathy in the cervical, axillary or inguinal regions LUNGS: clear to auscultation and  No wheeze or crackles HEART/CVS: regular rate & rhythm and no murmurs; No lower extremity edema ABDOMEN: abdomen soft, non-tender and normal bowel sounds Musculoskeletal:no cyanosis of digits and no clubbing  PSYCH: alert & oriented x 3 with fluent speech NEURO: no focal motor/sensory deficits SKIN:  no rashes or significant lesions  LABORATORY  DATA:  I have reviewed the data as listed Lab Results  Component Value Date   WBC 7.6 06/30/2016   HGB 9.5 (L) 06/30/2016   HCT 30.1 (L) 06/30/2016   MCV 82.6 06/30/2016   PLT 230 06/30/2016    Recent Labs  08/20/15 1758 10/01/15 1338 10/15/15 1645  NA 134* 140 135  K 4.6 4.0 4.2  CL 103 102 101  CO2 25 31 28   GLUCOSE 100* 88 103*  BUN 29* 20 25*  CREATININE 0.84 0.86 0.69  CALCIUM 9.5 9.8 9.3  GFRNONAA >60  --  >60  GFRAA >60  --  >60  PROT  --  6.7  --   ALBUMIN  --  4.0  --   AST  --  19  --   ALT  --  18  --   ALKPHOS  --  75  --   BILITOT  --  0.4  --     RADIOGRAPHIC STUDIES: I have personally reviewed the radiological images as listed and agreed with the findings in the report. Mm Digital Screening  Result Date: 07/07/2016 CLINICAL DATA:  Screening. EXAM: DIGITAL SCREENING BILATERAL MAMMOGRAM WITH CAD COMPARISON:  Previous exam(s). ACR Breast Density Category b: There are scattered areas of fibroglandular density. FINDINGS: There are no findings suspicious for malignancy. Images were processed with CAD. IMPRESSION: No mammographic evidence of malignancy. A result letter of this screening mammogram will be mailed directly to the patient. RECOMMENDATION: Screening mammogram in one year. (Code:SM-B-01Y) BI-RADS CATEGORY  1: Negative. Electronically Signed   By: Fidela Salisbury M.D.   On: 07/07/2016 11:11   Mm  Outside Films Mammo  Result Date: 07/07/2016 This examination belongs to an outside facility and is stored here for comparison purposes only.  Contact the originating outside institution for any associated report or interpretation.   ASSESSMENT & PLAN:   Iron deficiency anemia due to chronic blood loss Patient has iron deficiency anemia with a hemoglobin of 7.8 ferritin of 7. EGD/Colo- negative September 2017. Status post IV iron 3 hemoglobin improved at 9.5. Ferritin improved.; However saturation still low at 8%.  # Recommend 3 more IV Venofer weekly.  # Follow-up in 3 months labs few days prior IV Venofer again.   All questions were answered. The patient knows to call the clinic with any problems, questions or concerns.   Cammie Sickle, MD 07/07/2016 2:23 PM

## 2016-07-07 NOTE — Assessment & Plan Note (Signed)
Patient has iron deficiency anemia with a hemoglobin of 7.8 ferritin of 7. EGD/Colo- negative September 2017. Status post IV iron 3 hemoglobin improved at 9.5. Ferritin improved.; However saturation still low at 8%.  # Recommend 3 more IV Venofer weekly.  # Follow-up in 3 months labs few days prior IV Venofer again.

## 2016-07-08 ENCOUNTER — Inpatient Hospital Stay: Payer: PPO

## 2016-07-09 ENCOUNTER — Ambulatory Visit: Payer: PPO | Admitting: General Surgery

## 2016-07-14 ENCOUNTER — Inpatient Hospital Stay: Payer: PPO

## 2016-07-14 ENCOUNTER — Telehealth: Payer: Self-pay | Admitting: *Deleted

## 2016-07-14 ENCOUNTER — Telehealth: Payer: Self-pay | Admitting: Internal Medicine

## 2016-07-14 ENCOUNTER — Ambulatory Visit: Payer: PPO

## 2016-07-14 ENCOUNTER — Ambulatory Visit: Payer: PPO | Admitting: Internal Medicine

## 2016-07-14 NOTE — Telephone Encounter (Signed)
Patient canceled appointment for today on 07/01/16, patient last hydrocodone filled 01/07/16 patient has canceled several appointment with other providers in [place] also. Just FYI, Patient also canceled with PCP in July.

## 2016-07-14 NOTE — Telephone Encounter (Signed)
Read previous message

## 2016-07-14 NOTE — Telephone Encounter (Signed)
Pt doesn't have an appt until next month. She needs a refill on her HYDROcodone-acetaminophen (NORCO) 10-325 MG tablet.

## 2016-07-15 ENCOUNTER — Telehealth: Payer: Self-pay | Admitting: *Deleted

## 2016-07-15 NOTE — Telephone Encounter (Signed)
Patient notified and aware.

## 2016-07-15 NOTE — Telephone Encounter (Signed)
No refills until seen ,  Has to be seen every 3 months for narcotics to be refilled.

## 2016-07-15 NOTE — Telephone Encounter (Signed)
Received referral for low dose lung cancer screening CT scan. Voicemail left at phone number listed in EMR for patient to call me back to facilitate scheduling scan.  

## 2016-07-16 ENCOUNTER — Inpatient Hospital Stay: Payer: PPO

## 2016-07-16 ENCOUNTER — Ambulatory Visit: Payer: PPO | Admitting: Surgery

## 2016-07-17 ENCOUNTER — Emergency Department
Admission: EM | Admit: 2016-07-17 | Discharge: 2016-07-17 | Disposition: A | Discharge status: Home / Self Care | Payer: PPO | Attending: Emergency Medicine | Admitting: Emergency Medicine

## 2016-07-17 ENCOUNTER — Encounter: Payer: Self-pay | Admitting: Emergency Medicine

## 2016-07-17 ENCOUNTER — Other Ambulatory Visit: Payer: Self-pay

## 2016-07-17 DIAGNOSIS — Z87891 Personal history of nicotine dependence: Secondary | ICD-10-CM | POA: Insufficient documentation

## 2016-07-17 DIAGNOSIS — N183 Chronic kidney disease, stage 3 (moderate): Secondary | ICD-10-CM | POA: Diagnosis not present

## 2016-07-17 DIAGNOSIS — N390 Urinary tract infection, site not specified: Secondary | ICD-10-CM | POA: Diagnosis not present

## 2016-07-17 DIAGNOSIS — Z7982 Long term (current) use of aspirin: Secondary | ICD-10-CM | POA: Insufficient documentation

## 2016-07-17 DIAGNOSIS — Z79899 Other long term (current) drug therapy: Secondary | ICD-10-CM | POA: Insufficient documentation

## 2016-07-17 DIAGNOSIS — Z85828 Personal history of other malignant neoplasm of skin: Secondary | ICD-10-CM | POA: Insufficient documentation

## 2016-07-17 DIAGNOSIS — R5383 Other fatigue: Secondary | ICD-10-CM | POA: Diagnosis present

## 2016-07-17 LAB — URINALYSIS COMPLETE WITH MICROSCOPIC (ARMC ONLY)
Bilirubin Urine: NEGATIVE
GLUCOSE, UA: 50 mg/dL — AB
Hgb urine dipstick: NEGATIVE
NITRITE: NEGATIVE
PROTEIN: 100 mg/dL — AB
SPECIFIC GRAVITY, URINE: 1.024 (ref 1.005–1.030)
pH: 5 (ref 5.0–8.0)

## 2016-07-17 LAB — TROPONIN I

## 2016-07-17 LAB — TYPE AND SCREEN
ABO/RH(D): O POS
ANTIBODY SCREEN: NEGATIVE

## 2016-07-17 LAB — CBC
HEMATOCRIT: 32.2 % — AB (ref 35.0–47.0)
Hemoglobin: 10.6 g/dL — ABNORMAL LOW (ref 12.0–16.0)
MCH: 27.7 pg (ref 26.0–34.0)
MCHC: 32.8 g/dL (ref 32.0–36.0)
MCV: 84.3 fL (ref 80.0–100.0)
PLATELETS: 174 10*3/uL (ref 150–440)
RBC: 3.82 MIL/uL (ref 3.80–5.20)
RDW: 22.5 % — AB (ref 11.5–14.5)
WBC: 6.9 10*3/uL (ref 3.6–11.0)

## 2016-07-17 LAB — BASIC METABOLIC PANEL
Anion gap: 10 (ref 5–15)
BUN: 24 mg/dL — AB (ref 6–20)
CO2: 25 mmol/L (ref 22–32)
CREATININE: 1.06 mg/dL — AB (ref 0.44–1.00)
Calcium: 10.5 mg/dL — ABNORMAL HIGH (ref 8.9–10.3)
Chloride: 100 mmol/L — ABNORMAL LOW (ref 101–111)
GFR calc Af Amer: 59 mL/min — ABNORMAL LOW (ref >60)
GFR, EST NON AFRICAN AMERICAN: 51 mL/min — AB (ref >60)
GLUCOSE: 125 mg/dL — AB (ref 65–99)
Potassium: 3.5 mmol/L (ref 3.5–5.1)
SODIUM: 135 mmol/L (ref 135–145)

## 2016-07-17 LAB — TSH: TSH: 1.999 u[IU]/mL (ref 0.350–4.500)

## 2016-07-17 MED ORDER — CEPHALEXIN 500 MG PO CAPS: 500.0000 mg | ORAL_CAPSULE | Freq: Two times a day (BID) | ORAL | 0 refills | Status: AC

## 2016-07-17 MED ORDER — SODIUM CHLORIDE 0.9 % IV BOLUS (SEPSIS)
500.0000 mL | Freq: Once | INTRAVENOUS | Status: AC
  Administered 2016-07-17: 500 mL via INTRAVENOUS

## 2016-07-17 NOTE — ED Triage Notes (Addendum)
Pt c/o weakness and fatigue since yesterday. receives weekly blood transfusion for anemia. Came from [facility] for low blood pressure.  Pressure in 0000000 systolic there but in 0000000 at previous office visits in epic.  Fell last night and hit head. No blood thinners or LOC. No acute distress at this time.

## 2016-07-17 NOTE — ED Notes (Signed)
Pt up to bathroom.

## 2016-07-17 NOTE — ED Notes (Addendum)
Pt stating that has been receiving iron from the cancer center. Pt stating the last time she had iron was last week. Pt stating she began feeling really weak yesterday. Pt stating she fell yesterday and hit her head and was not able to get up. Pt stating that the weakness has not improved at all. Pt stating that "I have not been this weak since I was sick." Pt is being seen at the wound clinic for a wound on her abdomen. Pt denying n/v. Pt stating, "it has been awhile since I have been hungry."

## 2016-07-17 NOTE — ED Provider Notes (Signed)
Time Seen: Approximately 1804  I have reviewed the triage notes  Chief Complaint: Fatigue   History of Present Illness: Meagan Zavala is a 70 y.o. female *who presents with some generalized weakness and fatigue. She states she feels like when her iron levels are low and is currently followed by hematology with iron shots. She was referred here from [facility] clinic with a low blood pressure however the patient has a long history of similar blood pressures in his denying any feelings of lightheadedness, shortness of breath or chest discomfort. Repeat blood pressure here was 91/51. She is apparently been evaluated for her anemia and has been ruled out for cancer or gastrointestinal source. The patient denies any focal weakness, trouble with speech or swallowing. [name] Patient had issues of driving poorly.   Past Medical History:  Diagnosis Date  . Anemia   . Arthritis   . Basal cell carcinoma of face   . BULIMIA many decades of this   patient keeps alive by drinking ensure that she cannot purge  . GERD (gastroesophageal reflux disease)   . Hypercholesteremia   . KIDNEY DISEASE, CHRONIC, STAGE III 2010   Cr 1.3 -> .84, probably from NSAID's and lasix  . LEAKAGE, CONTINUOUS URINE 07/30/2009  . LOW BACK PAIN, CHRONIC    takes tylenol  . Macular degeneration disease    [place], now [place]  . Major depressive disorder, recurrent episode, severe ([place])    well treated with SSRI's  . OSTEOPOROSIS    severe, treated with infusions, followed by endocrine in [place]  . Wears dentures    full upper and lower    Patient Active Problem List   Diagnosis Date Noted  . Iron deficiency anemia due to chronic blood loss 06/09/2016  . Vitamin D deficiency 04/09/2016  . Hypothyroidism 04/09/2016  .  Ulcer of abdomen wall with fat layer exposed ([place]) 04/09/2016  . Intention tremor 01/10/2016  . Anemia of chronic disease 11/27/2015  . Fatty liver disease, nonalcoholic [id number]  . Other and unspecified hyperlipidemia 09/01/2013  . Medicare annual wellness visit, subsequent 09/16/2012  . Polyarthritis 04/16/2012  . Macular degeneration disease   . Neck pain 03/03/2011  . Insomnia 11/25/2010  . MCI (mild cognitive impairment) with memory loss 11/18/2010  . BULIMIA 07/30/2009  . Major depressive disorder, recurrent episode, moderate ([place]) 07/30/2009  . GERD 07/30/2009  . KIDNEY DISEASE, CHRONIC, STAGE III 07/30/2009  . LOW BACK PAIN, CHRONIC 07/30/2009  . Osteoporosis 07/30/2009  . Urinary incontinence 07/30/2009    Past Surgical History:  Procedure Laterality Date  . CARPAL TUNNEL RELEASE    . CATARACT EXTRACTION    . COLON SURGERY  1980   partial colectomy  . COLONOSCOPY WITH PROPOFOL N/A 06/10/2016   Procedure: COLONOSCOPY WITH PROPOFOL;  Surgeon: Manya Silvas, MD;  Location[facility] ENDOSCOPY;  Service: Endoscopy;  Laterality: N/A;  . ECTROPION REPAIR Bilateral 08/13/2015   Procedure: REPAIR OF ECTROPION;  Surgeon: Karle Starch, MD;  Location: [place];  Service: Ophthalmology;  Laterality: Bilateral;  . ESOPHAGOGASTRODUODENOSCOPY (EGD) WITH PROPOFOL N/A 06/10/2016   Procedure: ESOPHAGOGASTRODUODENOSCOPY (EGD) WITH PROPOFOL;  Surgeon: Manya Silvas, MD;  Location: [facility] ENDOSCOPY;  Service: Endoscopy;  Laterality: N/A;  . 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 PTOSIS  REPAIR Bilateral 08/13/2015   Procedure: PTOSIS REPAIR;  Surgeon: Karle Starch, MD;  Location: [place];  Service: Ophthalmology;  Laterality: Bilateral;  . SURGICAL EXCISION OF EXCESSIVE SKIN  1979  . TUBAL LIGATION      Past Surgical History:  Procedure Laterality Date  . CARPAL TUNNEL RELEASE    . CATARACT EXTRACTION    . COLON SURGERY  1980   partial  colectomy  . COLONOSCOPY WITH PROPOFOL N/A 06/10/2016   Procedure: COLONOSCOPY WITH PROPOFOL;  Surgeon: Manya Silvas, MD;  Location: [facility] ENDOSCOPY;  Service: Endoscopy;  Laterality: N/A;  . ECTROPION REPAIR Bilateral 08/13/2015   Procedure: REPAIR OF ECTROPION;  Surgeon: Karle Starch, MD;  Location: [place];  Service: Ophthalmology;  Laterality: Bilateral;  . ESOPHAGOGASTRODUODENOSCOPY (EGD) WITH PROPOFOL N/A 06/10/2016   Procedure: ESOPHAGOGASTRODUODENOSCOPY (EGD) WITH PROPOFOL;  Surgeon: Manya Silvas, MD;  Location[facility] ENDOSCOPY;  Service: Endoscopy;  Laterality: N/A;  . 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 PTOSIS REPAIR Bilateral 08/13/2015   Procedure: PTOSIS REPAIR;  Surgeon: Karle Starch, MD;  Location: [place];  Service: Ophthalmology;  Laterality: Bilateral;  . SURGICAL EXCISION OF EXCESSIVE SKIN  1979  . TUBAL LIGATION      Current Outpatient Rx  . Order #: [id number] Class: OTC  . Order #: [id number] Class: Historical Med  . Order #: [id number] Class: Print  . Order #: [id number] Class: Historical Med  . Order #: [id number] Class: Historical Med  . Order #: [id number] Class: Historical Med  . Order #: [id number] Class: Historical Med  . Order #: [id number] Class: Historical Med  . Order #: [id number] Class: Normal  . Order #: [id number] Class: Print  . Order #: [id number] Class: Normal  . Order #: [id number] Class: Historical Med  . Order #: [id number] Class: Normal  . Order #: [id number] Class: Normal  . Order #: [id number] Class: Normal  . Order #: [id number] Class: Historical Med  . Order #: [id number] Class: Normal  . Order #: [id number] Class: Historical Med  . Order #: [id number] Class: Historical Med  . Order #: [id number] Class: Historical Med    Allergies:  Review of patient's allergies indicates no known allergies.  Family History: Family History  Problem Relation Age of Onset  . Cancer Mother     breast,   . Breast cancer Mother   . Cancer  Father     lung  . Cancer Sister     lung, brain  . Cancer Brother     throat    Social History: Social History  Substance Use Topics  . Smoking status: Former Smoker    Packs/day: 1.50    Years: 40.00    Quit date: 10/02/2010  . Smokeless tobacco: Never Used  . 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     Review of Systems:   10 point review of systems was performed and was otherwise negative:  Constitutional: No fever Eyes: No visual disturbances ENT: No sore throat, ear pain Cardiac: No chest pain Respiratory: No shortness of breath, wheezing, or stridor Abdomen: No abdominal pain, no vomiting, No diarrhea Endocrine: No weight loss, No night sweats Extremities: No peripheral edema, cyanosis Skin: No rashes, easy bruising Neurologic: No focal weakness, trouble with speech or swollowing Urologic: No dysuria, Hematuria, or urinary frequency   Physical Exam:  ED Triage Vitals  Enc Vitals Group     BP 07/17/16 1537 (!) 91/51     Pulse Rate 07/17/16 1537  63     Resp 07/17/16 1537 16     Temp 07/17/16 1537 97.6 F (36.4 C)     Temp Source 07/17/16 1537 Oral     SpO2 07/17/16 1537 96 %     Weight 07/17/16 1531 122 lb (55.3 kg)     Height 07/17/16 1531 5\' 1"  (1.549 m)     Head Circumference --      Peak Flow --      Pain Score 07/17/16 1738 0     Pain Loc --      Pain Edu? --      Excl. in [place]? --     General: Awake , Alert , and Oriented times 3; GCS 15 Head: Normal cephalic , atraumatic Eyes: Pupils equal , round, reactive to light Nose/Throat: No nasal drainage, patent upper airway without erythema or exudate.  Neck: Supple, Full range of motion, No anterior adenopathy or palpable thyroid masses Lungs: Clear to ascultation without wheezes , rhonchi, or rales Heart: Regular rate, regular rhythm without murmurs , gallops , or rubs Abdomen: Soft, non tender without rebound, guarding , or rigidity; bowel sounds positive and  symmetric in all 4 quadrants. No organomegaly .        Extremities: 2 plus symmetric pulses. No edema, clubbing or cyanosis Neurologic: normal ambulation, Motor symmetric without deficits, sensory intact Skin: warm, dry, no rashes   Labs:   All laboratory work was reviewed including any pertinent negatives or positives listed below:  Labs Reviewed  BASIC METABOLIC PANEL - Abnormal; Notable for the following:       Result Value   Chloride 100 (*)    Glucose, Bld 125 (*)    BUN 24 (*)    Creatinine, Ser 1.06 (*)    Calcium 10.5 (*)    GFR calc non Af Amer 51 (*)    GFR calc Af Amer 59 (*)    All other components within normal limits  CBC - Abnormal; Notable for the following:    Hemoglobin 10.6 (*)    HCT 32.2 (*)    RDW 22.5 (*)    All other components within normal limits  URINALYSIS COMPLETEWITH MICROSCOPIC ([facility] ONLY) - Abnormal; Notable for the following:    Color, Urine AMBER (*)    APPearance CLOUDY (*)    Glucose, UA 50 (*)    Ketones, ur TRACE (*)    Protein, ur 100 (*)    Leukocytes, UA 2+ (*)    Bacteria, UA MANY (*)    Squamous Epithelial / LPF 0-5 (*)    All other components within normal limits  URINE CULTURE  TROPONIN I  TSH  CBG MONITORING, ED  TYPE AND SCREEN  Review of her laboratory work shows a stable hemoglobin for the patient. She does have finding of some dehydration along with a urinary tract infection  EKG: * ED ECG REPORT I, Daymon Larsen, the attending physician, personally viewed and interpreted this ECG.  Date: 07/17/2016 EKG Time: 1546 Rate: *72 Rhythm: normal sinus rhythm QRS Axis: normal Intervals: normal ST/T Wave abnormalities: normal Conduction Disturbances: none Narrative Interpretation: unremarkable No acute ischemic changes   ED Course:  Patient was given some IV fluids here in emergency department and has a urine culture pending. I felt it the urinary tract infection was unlikely to be the cause of the patient's  symptoms however I felt it was necessary to clear the urine and perform a urine culture for further outpatient assessments. The  patient denies any dysuria, hematuria though she states her urine has been "" cloudy "". She denies any back or flank pain. Her memory deficits and based on conversation with the patient and also with her friend may be the indications of early dementia. She does not exhibit any focal neurologic symptoms. Clinical Course     Assessment: * Lower urinary tract infection Generalized weakness and fatigue Possible early dementia  Final Clinical Impression:   Final diagnoses:  Lower urinary tract infectious disease     Plan: * Her family was advised her workup is not complete and she does need to follow up with her primary physician. " Discharge Medication List as of 07/17/2016  7:30 PM    START taking these medications   Details  cephALEXin (KEFLEX) 500 MG capsule Take 1 capsule (500 mg total) by mouth 2 (two) times daily., Starting Fri 07/17/2016, Until Fri 07/24/2016, Print      " Patient was advised to return immediately if condition worsens. Patient was advised to follow up with their primary care physician or other specialized physicians involved in their outpatient care. The patient and/or family member/power of attorney had laboratory results reviewed at the bedside. All questions and concerns were addressed and appropriate discharge instructions were distributed by the nursing staff.            Daymon Larsen, MD 07/17/16 2141

## 2016-07-17 NOTE — Discharge Instructions (Signed)
Please return immediately if condition worsens. Please contact her primary physician or the physician you were given for referral. If you have any specialist physicians involved in her treatment and plan please also contact them. Thank you for using [facility]. ° °

## 2016-07-17 NOTE — ED Notes (Signed)
Pt informed other RN that she received iron infusions not blood

## 2016-07-17 NOTE — ED Notes (Signed)
Pt alert and oriented X4, active, cooperative, pt in NAD. RR even and unlabored, color WNL.  Pt informed to return if any life threatening symptoms occur.   

## 2016-07-20 LAB — URINE CULTURE: Culture: >=100000 — AB

## 2016-07-21 ENCOUNTER — Inpatient Hospital Stay: Payer: PPO

## 2016-07-21 VITALS — BP 104/71 | HR 63 | Temp 97.1°F | Resp 20

## 2016-07-21 DIAGNOSIS — D500 Iron deficiency anemia secondary to blood loss (chronic): Secondary | ICD-10-CM | POA: Diagnosis not present

## 2016-07-21 MED ORDER — IRON SUCROSE 20 MG/ML IV SOLN
200.0000 mg | Freq: Once | INTRAVENOUS | Status: AC
  Administered 2016-07-21: 200 mg via INTRAVENOUS
  Filled 2016-07-21: qty 10

## 2016-07-21 MED ORDER — SODIUM CHLORIDE 0.9 % IV SOLN
Freq: Once | INTRAVENOUS | Status: AC
  Administered 2016-07-21: 14:00:00 via INTRAVENOUS
  Filled 2016-07-21: qty 1000

## 2016-07-21 MED ORDER — SODIUM CHLORIDE 0.9 % IV SOLN: 200.0000 mg | Freq: Once | INTRAVENOUS | Status: DC

## 2016-07-22 ENCOUNTER — Telehealth: Payer: Self-pay | Admitting: *Deleted

## 2016-07-22 NOTE — Telephone Encounter (Signed)
Patient has previously not been able to attend lung cancer screening scan. Is scheduled for 08/11/16 at 1:30 pending insurance approval.

## 2016-07-27 ENCOUNTER — Encounter: Payer: PPO | Attending: Surgery | Admitting: Surgery

## 2016-07-27 DIAGNOSIS — F419 Anxiety disorder, unspecified: Secondary | ICD-10-CM | POA: Insufficient documentation

## 2016-07-27 DIAGNOSIS — Z7982 Long term (current) use of aspirin: Secondary | ICD-10-CM | POA: Insufficient documentation

## 2016-07-27 DIAGNOSIS — X58XXXA Exposure to other specified factors, initial encounter: Secondary | ICD-10-CM | POA: Insufficient documentation

## 2016-07-27 DIAGNOSIS — D631 Anemia in chronic kidney disease: Secondary | ICD-10-CM | POA: Insufficient documentation

## 2016-07-27 DIAGNOSIS — N3941 Urge incontinence: Secondary | ICD-10-CM | POA: Diagnosis not present

## 2016-07-27 DIAGNOSIS — S31105A Unspecified open wound of abdominal wall, periumbilic region without penetration into peritoneal cavity, initial encounter: Secondary | ICD-10-CM | POA: Insufficient documentation

## 2016-07-27 DIAGNOSIS — M810 Age-related osteoporosis without current pathological fracture: Secondary | ICD-10-CM | POA: Insufficient documentation

## 2016-07-27 DIAGNOSIS — E039 Hypothyroidism, unspecified: Secondary | ICD-10-CM | POA: Diagnosis not present

## 2016-07-27 DIAGNOSIS — Z87891 Personal history of nicotine dependence: Secondary | ICD-10-CM | POA: Diagnosis not present

## 2016-07-27 DIAGNOSIS — K219 Gastro-esophageal reflux disease without esophagitis: Secondary | ICD-10-CM | POA: Diagnosis not present

## 2016-07-27 DIAGNOSIS — Z79899 Other long term (current) drug therapy: Secondary | ICD-10-CM | POA: Diagnosis not present

## 2016-07-27 DIAGNOSIS — Z9884 Bariatric surgery status: Secondary | ICD-10-CM | POA: Insufficient documentation

## 2016-07-27 DIAGNOSIS — E559 Vitamin D deficiency, unspecified: Secondary | ICD-10-CM | POA: Insufficient documentation

## 2016-07-27 DIAGNOSIS — L02221 Furuncle of abdominal wall: Secondary | ICD-10-CM | POA: Insufficient documentation

## 2016-07-27 DIAGNOSIS — N183 Chronic kidney disease, stage 3 (moderate): Secondary | ICD-10-CM | POA: Diagnosis not present

## 2016-07-28 ENCOUNTER — Inpatient Hospital Stay: Payer: PPO

## 2016-07-28 VITALS — BP 107/69 | HR 69 | Temp 97.5°F | Resp 20

## 2016-07-28 DIAGNOSIS — D500 Iron deficiency anemia secondary to blood loss (chronic): Secondary | ICD-10-CM | POA: Diagnosis not present

## 2016-07-28 MED ORDER — IRON SUCROSE 20 MG/ML IV SOLN
200.0000 mg | Freq: Once | INTRAVENOUS | Status: AC
  Administered 2016-07-28: 200 mg via INTRAVENOUS

## 2016-07-28 MED ORDER — SODIUM CHLORIDE 0.9 % IV SOLN: 200.0000 mg | Freq: Once | INTRAVENOUS | Status: DC

## 2016-07-28 MED ORDER — SODIUM CHLORIDE 0.9 % IV SOLN
Freq: Once | INTRAVENOUS | Status: AC
  Administered 2016-07-28: 15:00:00 via INTRAVENOUS
  Filled 2016-07-28: qty 1000

## 2016-07-28 NOTE — Progress Notes (Signed)
Meagan Zavala ([id number]) Visit Report for 07/27/2016 Chief Complaint Document Details Patient Name: Meagan Zavala, Meagan Zavala 07/27/2016 3:30 Date of Service: PM Medical Record [id number] Number: Patient Account Number: [id number] 1946/09/05 (70 y.o. Treating RN: Ahmed Prima Date of Birth/Sex: Female) Other Clinician: Primary Care Physician: Deborra Medina Treating Christin Fudge Referring Physician: Deborra Medina Physician/Extender: Suella Grove in Treatment: 14 Information Obtained from: Patient Chief Complaint Patient seen for complaints of Non-Healing Wound to the right of from licorice on her abdominal wall for about a month Electronic Signature(s) Signed: 07/27/2016 3:56:14 PM By: Christin Fudge MD, FACS Entered By: Christin Fudge on 07/27/2016 15:56:14 [name] ([id number]) -------------------------------------------------------------------------------- HPI Details Patient Name: Meagan Austin I. 07/27/2016 3:30 Date of Service: PM Medical Record [id number] Number: Patient Account Number: [id number] [birth year] (70 y.o. Treating RN: Ahmed Prima Date of Birth/Sex: Female) Other Clinician: Primary Care Physician: Deborra Medina Treating Christin Fudge Referring Physician: Deborra Medina Physician/Extender: Suella Grove in Treatment: 14 History of Present Illness Location: umbilical region of the abdominal wall Quality: an open ulcerated area some amount of pain and burning Severity: Patient states wound are getting worse. Duration: Patient has had the wound for < 4 weeks prior to presenting for treatment Timing: Pain in wound is Intermittent (comes and goes Context: The wound appeared gradually over time Modifying Factors: Other treatment(s) tried include:and doxycycline the present time Associated Signs and Symptoms: Patient reports having increase discharge. HPI Description: 70 year old patient who was recently seen by Dr. Tommi Rumps was noted to have a small  ulceration on abdomen wall. the patient has had it for about 6 weeks in the periumbilical areaThe patient noted that it has become slightly larger and draining purulent material.was started on doxycycline and given an appointment to the wound center. She does not recall if she had any insect bite or injury her past medical history significant for GERD, hypothyroidism, osteoporosis, chronic kidney disease stage III, chronic low back pain, anemia, vitamin D deficiency and urge incontinence. 7/27//2017 -- pathology report of specimen done -- DIAGNOSIS: A. SKIN, ABDOMEN; BIOPSY: - CHRONIC ULCER. - NEGATIVE FOR MALIGNANCY Electronic Signature(s) Signed: 07/27/2016 3:56:19 PM By: Christin Fudge MD, FACS Entered By: Christin Fudge on 07/27/2016 15:56:19 [name] ([id number]) -------------------------------------------------------------------------------- Physical Exam Details Patient Name: Meagan Austin I. 07/27/2016 3:30 Date of Service: PM Medical Record [id number] Number: Patient Account Number: [id number] 1946-08-16 (70 y.o. Treating RN: Ahmed Prima Date of Birth/Sex: Female) Other Clinician: Primary Care Physician: Deborra Medina Treating Christin Fudge Referring Physician: Deborra Medina Physician/Extender: Weeks in Treatment: 14 Constitutional . Pulse regular. Respirations normal and unlabored. Afebrile. . Eyes Nonicteric. Reactive to light. Ears, Nose, Mouth, and Throat Lips, teeth, and gums WNL.Marland Kitchen Moist mucosa without lesions. Neck supple and nontender. No palpable supraclavicular or cervical adenopathy. Normal sized without goiter. Respiratory WNL. No retractions.. Breath sounds WNL, No rubs, rales, rhonchi, or wheeze.. Cardiovascular Heart rhythm and rate regular, no murmur or gallop.. Pedal Pulses WNL. No clubbing, cyanosis or edema. Chest Breasts symmetical and no nipple discharge.. Breast tissue WNL, no masses, lumps, or tenderness.. Lymphatic No adneopathy. No  adenopathy. No adenopathy. Musculoskeletal Adexa without tenderness or enlargement.. Digits and nails w/o clubbing, cyanosis, infection, petechiae, ischemia, or inflammatory conditions.. Integumentary (Hair, Skin) No suspicious lesions. No crepitus or fluctuance. No peri-wound warmth or erythema. No masses.Marland Kitchen Psychiatric Judgement and insight Intact.. No evidence of depression, anxiety, or agitation.. Notes wound is completely healed and she has a supple scar Electronic Signature(s) Signed: 07/27/2016 3:56:44 PM By: Christin Fudge MD, FACS Entered By:  Christin Fudge on 07/27/2016 15:56:44 Meagan Zavala, Meagan Zavala ([id number]) -------------------------------------------------------------------------------- Physician Orders Details Patient Name: Meagan Zavala 07/27/2016 3:30 Date of Service: PM Medical Record [id number] Number: Patient Account Number: [id number] [birth year] (70 y.o. Treating RN: Ahmed Prima Date of Birth/Sex: Female) Other Clinician: Primary Care Physician: Deborra Medina Treating Christin Fudge Referring Physician: Deborra Medina Physician/Extender: Suella Grove in Treatment: 73 Verbal / Phone Orders: Yes Clinician: Carolyne Fiscal, Debi Read Back and Verified: Yes Diagnosis Coding Discharge From [facility] Services o Discharge from [place] area, keep clean and dry. If you notice any drainage please let us know. If you have any questions or concerns please contact our office. Electronic Signature(s) Signed: 07/27/2016 3:58:21 PM By: Christin Fudge MD, FACS Signed: 07/27/2016 4:46:11 PM By: Alric Quan Entered By: Alric Quan on 07/27/2016 15:45:04 [name] -------------------------------------------------------------------------------- Problem List Details Patient Name: Meagan Zavala, Meagan Zavala 07/27/2016 3:30 Date of Service: PM Medical Record [id number] Number: Patient Account Number: [id number] [birth year] (70 y.o. 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 07/27/2016 3:30 Date of Service: PM Medical Record [id number] Number: Patient Account Number: [id number] 06/21/1946 (70 y.o. Treating RN: Ahmed Prima Date of Birth/Sex: Female) Other Clinician: Primary Care Physician: Deborra Medina Treating Christin Fudge Referring Physician: Deborra Medina Physician/Extender: Suella Grove in Treatment: 14 Subjective Chief Complaint Information obtained from Patient Patient seen for complaints of Non-Healing Wound to the right of from licorice on her abdominal wall for about a month History of Present Illness (HPI) The following HPI elements were documented for the patient's wound: Location: umbilical region of the abdominal wall Quality: an open ulcerated area some amount of pain and burning Severity: Patient states wound are getting worse. Duration: Patient has had the wound for < 4 weeks prior to presenting for treatment Timing: Pain in wound is Intermittent (comes and goes Context: The wound appeared gradually over time Modifying Factors: Other  treatment(s) tried include:and doxycycline the present time Associated Signs and Symptoms: Patient reports having increase discharge. 70 year old patient who was recently seen by Dr. Tommi Rumps was noted to have a small ulceration on abdomen wall. the patient has had it for about 6 weeks in the periumbilical areaThe patient noted that it has become slightly larger and draining purulent material.was started on doxycycline and given an appointment to the wound center. She does not recall if she had any insect bite or injury her past medical history significant for GERD, hypothyroidism, osteoporosis, chronic kidney disease stage III, chronic low back pain, anemia, vitamin D deficiency and urge incontinence. 7/27//2017 -- pathology report of specimen done -- DIAGNOSIS: A. SKIN, ABDOMEN; BIOPSY: - CHRONIC ULCER. - NEGATIVE FOR MALIGNANCY Objective Constitutional Pulse regular. Respirations normal and unlabored. Afebrile. Meagan Zavala, [name] ([id number]) Vitals Time Taken: 3:35 PM, Height: 61 in, Weight: 125 lbs, BMI: 23.6, Temperature: 98.1 F, Pulse: 64 bpm, Respiratory Rate: 16 breaths/min, Blood Pressure: 117/52 mmHg. Eyes Nonicteric. Reactive to  light. Ears, Nose, Mouth, and Throat Lips, teeth, and gums WNL.Marland Kitchen Moist mucosa without lesions. Neck supple and nontender. No palpable supraclavicular or cervical adenopathy. Normal sized without goiter. Respiratory WNL. No retractions.. Breath sounds WNL, No rubs, rales, rhonchi, or wheeze.. Cardiovascular Heart rhythm and rate regular, no murmur or gallop.. Pedal Pulses WNL. No clubbing, cyanosis or edema. Chest Breasts symmetical and no nipple discharge.. Breast tissue WNL, no masses, lumps, or tenderness.. Lymphatic No adneopathy. No adenopathy. No adenopathy. Musculoskeletal Adexa without tenderness or enlargement.. Digits and nails w/o clubbing, cyanosis, infection, petechiae, ischemia, or inflammatory  conditions.Marland Kitchen Psychiatric Judgement and insight Intact.. No evidence of depression, anxiety, or agitation.. General Notes: wound is completely healed and she has a supple scar Integumentary (Hair, Skin) No suspicious lesions. No crepitus or fluctuance. No peri-wound warmth or erythema. No masses.. Wound #1 status is Open. Original cause of wound was Gradually Appeared. The wound is located on the Right,Medial Abdomen - midline. The wound measures 0cm length x 0cm width x 0cm depth; 0cm^2 area and 0cm^3 volume. The wound is limited to skin breakdown. There is no tunneling or undermining noted. There is a none present amount of drainage noted. The wound margin is flat and intact. There is no granulation within the wound bed. There is no necrotic tissue within the wound bed. The periwound skin appearance did not exhibit: Callus, Crepitus, Excoriation, Fluctuance, Friable, Induration, Localized Edema, Rash, Scarring, Dry/Scaly, Maceration, Moist, Atrophie Blanche, Cyanosis, Ecchymosis, Hemosiderin Staining, Mottled, Pallor, Rubor, Erythema. Periwound temperature was noted as No Abnormality. Assessment Meagan Zavala, Meagan Zavala ([id number]) Active Problems ICD-10 S31.105A - Unspecified open wound of abdominal wall, periumbilic region without penetration into peritoneal cavity, initial encounter L02.221 - Furuncle of abdominal wall Plan Discharge From [facility] Services: Discharge from [place] area, keep clean and dry. If you notice any drainage please let us know. If you have any questions or concerns please contact our office. I have discharged from the wound care services and will be seeing him back only if required. I have asked her to protect the wound with a large Band-Aid and return only if she has recurrence or discharge from the wound. Electronic Signature(s) Signed: 07/27/2016 3:57:26 PM By: Christin Fudge MD, FACS Entered By: Christin Fudge on 07/27/2016 15:57:26 [name] -------------------------------------------------------------------------------- SuperBill Details Patient Name: Meagan Austin I. Date of Service: 07/27/2016 Medical Record Number: [id number] Patient Account Number: [id number] Date of Birth/Sex: 1946/06/08 (70 y.o. Female) Treating RN: Carolyne Fiscal, Debi Primary Care Physician: Deborra Medina Other Clinician: Referring Physician: Deborra Medina Treating Physician/Extender: Frann Rider in Treatment: 14 Diagnosis Coding ICD-10 Codes Code Description Unspecified open wound of abdominal wall, periumbilic region without penetration into S31.105A peritoneal cavity, initial encounter 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 Facility Procedures CPT4 Code: [id number] Description: [id number] - WOUND CARE VISIT-LEV 2 EST PT Modifier: Quantity: 1 Physician Procedures CPT4: Description Modifier Quantity Code [id number] - WC PHYS LEVEL 2 - EST PT 1 ICD-10 Description Diagnosis S31.105A Unspecified open wound of abdominal wall, periumbilic region without penetration into peritoneal cavity, initial encounter L02.221  Furuncle of abdominal wall Electronic Signature(s) Signed: 07/27/2016 4:46:11 PM By: Alric Quan Previous Signature: 07/27/2016 3:58:00 PM Version By: Christin Fudge MD, FACS Entered By: Alric Quan on 07/27/2016 16:08:59

## 2016-07-28 NOTE — Progress Notes (Signed)
[name], [name] ([id number]) Visit Report for 07/27/2016 Arrival Information Details Patient Name: Meagan Zavala, Meagan Zavala Zavala. Date of Service: 07/27/2016 3:30 PM Medical Record Number: [id number] Patient Account Number: [id number] Date of Birth/Sex: Apr 11, 1946 (70 y.o. Female) Treating RN: Carolyne Fiscal, Debi Primary Care Physician: Deborra Medina Other Clinician: Referring Physician: Deborra Medina Treating Physician/Extender: Frann Rider in Treatment: 14 Visit Information History Since Last Visit All ordered tests and consults were completed: No Patient Arrived: Ambulatory Added or deleted any medications: No Arrival Time: 15:34 Any new allergies or adverse reactions: No Accompanied By: self Had a fall or experienced change in No Transfer Assistance: None activities of daily living that may affect Patient Identification Verified: Yes risk of falls: Secondary Verification Process Yes Signs or symptoms of abuse/neglect since last No Completed: visito Patient Requires Transmission-Based No Pain Present Now: No Precautions: Patient Has Alerts: No Electronic Signature(s) Signed: 07/27/2016 4:46:11 PM By: Alric Quan Entered By: Alric Quan on 07/27/2016 15:35:03 Meagan Zavala ([id number]) -------------------------------------------------------------------------------- Clinic Level of Care Assessment Details Patient Name: Meagan Zavala. Date of Service: 07/27/2016 3:30 PM Medical Record Number: [id number] Patient Account Number: [id number] Date of Birth/Sex: July 03, 1946 (70 y.o. Female) Treating RN: Carolyne Fiscal, Debi Primary Care Physician: Deborra Medina Other Clinician: Referring Physician: Deborra Medina Treating Physician/Extender: Frann Rider in Treatment: 14 Clinic Level of Care Assessment Items TOOL 4 Quantity Score X - Use when only an EandM is performed on FOLLOW-UP visit 1 0 ASSESSMENTS - Nursing Assessment / Reassessment X - Reassessment of  Co-morbidities (includes updates in patient status) 1 10 X - Reassessment of Adherence to Treatment Plan 1 5 ASSESSMENTS - Wound and Skin Assessment / Reassessment X - Simple Wound Assessment / Reassessment - one wound 1 5 []  - Complex Wound Assessment / Reassessment - multiple wounds 0 []  - Dermatologic / Skin Assessment (not related to wound area) 0 ASSESSMENTS - Focused Assessment []  - Circumferential Edema Measurements - multi extremities 0 []  - Nutritional Assessment / Counseling / Intervention 0 []  - Lower Extremity Assessment (monofilament, tuning fork, pulses) 0 []  - Peripheral Arterial Disease Assessment (using hand held doppler) 0 ASSESSMENTS - Ostomy and/or Continence Assessment and Care []  - Incontinence Assessment and Management 0 []  - Ostomy Care Assessment and Management (repouching, etc.) 0 PROCESS - Coordination of Care X - Simple Patient / Family Education for ongoing care 1 15 []  - Complex (extensive) Patient / Family Education for ongoing care 0 X - Staff obtains Programmer, systems, Records, Test Results / Process Orders 1 10 []  - Staff telephones HHA, Nursing Homes / Clarify orders / etc 0 []  - Routine Transfer to another Facility (non-emergent condition) 0 Meagan Zavala, Meagan Zavala. ([id number]) []  - Routine Hospital Admission (non-emergent condition) 0 []  - New Admissions / Biomedical engineer / Ordering NPWT, Apligraf, etc. 0 []  - Emergency Hospital Admission (emergent condition) 0 X - Simple Discharge Coordination 1 10 []  - Complex (extensive) Discharge Coordination 0 PROCESS - Special Needs []  - Pediatric / Minor Patient Management 0 []  - Isolation Patient Management 0 []  - Hearing / Language / Visual special needs 0 []  - Assessment of Community assistance (transportation, D/C planning, etc.) 0 []  - Additional assistance / Altered mentation 0 []  - Support Surface(s) Assessment (bed, cushion, seat, etc.) 0 INTERVENTIONS - Wound Cleansing / Measurement X - Simple Wound  Cleansing - one wound 1 5 []  - Complex Wound Cleansing - multiple wounds 0 X - Wound Imaging (photographs - any number of wounds) 1 5 []  - Wound Tracing (instead  of photographs) 0 []  - Simple Wound Measurement - one wound 0 []  - Complex Wound Measurement - multiple wounds 0 INTERVENTIONS - Wound Dressings []  - Small Wound Dressing one or multiple wounds 0 []  - Medium Wound Dressing one or multiple wounds 0 []  - Large Wound Dressing one or multiple wounds 0 []  - Application of Medications - topical 0 []  - Application of Medications - injection 0 INTERVENTIONS - Miscellaneous []  - External ear exam 0 Meagan Zavala, Meagan Zavala. ([id number]) []  - Specimen Collection (cultures, biopsies, blood, body fluids, etc.) 0 []  - Specimen(s) / Culture(s) sent or taken to Lab for analysis 0 []  - Patient Transfer (multiple staff / Harrel Lemon Lift / Similar devices) 0 []  - Simple Staple / Suture removal (25 or less) 0 []  - Complex Staple / Suture removal (26 or more) 0 []  - Hypo / Hyperglycemic Management (close monitor of Blood Glucose) 0 []  - Ankle / Brachial Index (ABI) - do not check if billed separately 0 X - Vital Signs 1 5 Has the patient been seen at the hospital within the last three years: Yes Total Score: 70 Level Of Care: New/Established - Level 2 Electronic Signature(s) Signed: 07/27/2016 4:46:11 PM By: Alric Quan Entered By: Alric Quan on 07/27/2016 16:07:14 Meagan Zavala ([id number]) -------------------------------------------------------------------------------- Encounter Discharge Information Details Patient Name: Meagan Zavala. Date of Service: 07/27/2016 3:30 PM Medical Record Number: [id number] Patient Account Number: [id number] Date of Birth/Sex: [birth year] (70 y.o. Female) Treating RN: Meagan Zavala Primary Care Physician: Deborra Medina Other Clinician: Referring Physician: Deborra Medina Treating Physician/Extender: Frann Rider in Treatment: 91 Encounter  Discharge Information Items Discharge Pain Level: 0 Discharge Condition: Stable Ambulatory Status: Ambulatory Discharge Destination: Home Transportation: Private Auto Accompanied By: self Schedule Follow-up Appointment: Yes Medication Reconciliation completed and provided to Patient/Care Yes Meagan Zavala: Provided on Clinical Summary of Care: 07/27/2016 Form Type Recipient Paper Patient Meagan Zavala Electronic Signature(s) Signed: 07/27/2016 3:50:15 PM By: Ruthine Dose Entered By: Ruthine Dose on 07/27/2016 15:50:14 Meagan Zavala ([id number]) -------------------------------------------------------------------------------- Lower Extremity Assessment Details Patient Name: Meagan Zavala. Date of Service: 07/27/2016 3:30 PM Medical Record Number: [id number] Patient Account Number: [id number] Date of Birth/Sex: [birth year] (70 y.o. Female) Treating RN: Carolyne Fiscal, Debi Primary Care Physician: Deborra Medina Other Clinician: Referring Physician: Deborra Medina Treating Physician/Extender: Frann Rider in Treatment: 14 Electronic Signature(s) Signed: 07/27/2016 4:46:11 PM By: Alric Quan Entered By: Alric Quan on 07/27/2016 15:36:51 Meagan Zavala ([id number]) -------------------------------------------------------------------------------- Multi Wound Chart Details Patient Name: Meagan Zavala. Date of Service: 07/27/2016 3:30 PM Medical Record Number: [id number] Patient Account Number: [id number] Date of Birth/Sex: [birth year] (70 y.o. Female) Treating RN: Meagan Zavala Primary Care Physician: Deborra Medina Other Clinician: Referring Physician: Deborra Medina Treating Physician/Extender: Frann Rider in Treatment: 14 Vital Signs Height(in): 61 Pulse(bpm): 64 Weight(lbs): 125 Blood Pressure 117/52 (mmHg): Body Mass Index(BMI): 24 Temperature(F): 98.1 Respiratory Rate 16 (breaths/min): Photos: [N/A:N/A] Wound Location: Right Abdomen - midline - N/A  N/A Medial Wounding Event: Gradually Appeared N/A N/A Primary Etiology: Abscess N/A N/A Comorbid History: Confinement Anxiety N/A N/A Date Acquired: 02/27/2016 N/A N/A Weeks of Treatment: 14 N/A N/A Wound Status: Open N/A N/A Measurements L x W x D 0x0x0 N/A N/A (cm) Area (cm) : 0 N/A N/A Volume (cm) : 0 N/A N/A % Reduction in Area: 100.00% N/A N/A % Reduction in Volume: 100.00% N/A N/A Classification: Partial Thickness N/A N/A Exudate Amount: None Present N/A N/A Wound Margin: Flat and Intact N/A N/A Granulation Amount: None Present (0%) N/A  N/A Necrotic Amount: None Present (0%) N/A N/A Exposed Structures: Fascia: No N/A N/A Fat: No Tendon: No Muscle: No Betzold, Alanah Zavala. ([id number]) Joint: No Bone: No Limited to Skin Breakdown Epithelialization: Large (67-100%) N/A N/A Periwound Skin Texture: Edema: No N/A N/A Excoriation: No Induration: No Callus: No Crepitus: No Fluctuance: No Friable: No Rash: No Scarring: No Periwound Skin Maceration: No N/A N/A Moisture: Moist: No Dry/Scaly: No Periwound Skin Color: Atrophie Blanche: No N/A N/A Cyanosis: No Ecchymosis: No Erythema: No Hemosiderin Staining: No Mottled: No Pallor: No Rubor: No Temperature: No Abnormality N/A N/A Tenderness on No N/A N/A Palpation: Wound Preparation: Ulcer Cleansing: N/A N/A Rinsed/Irrigated with Saline Topical Anesthetic Applied: None Treatment Notes Electronic Signature(s) Signed: 07/27/2016 4:46:11 PM By: Alric Quan Entered By: Alric Quan on 07/27/2016 15:43:33 Meagan Zavala ([id number]) -------------------------------------------------------------------------------- Meagan Zavala Plan Details Patient Name: Meagan Zavala. Date of Service: 07/27/2016 3:30 PM Medical Record Number: [id number] Patient Account Number: [id number] Date of Birth/Sex: [birth year] (70 y.o. Female) Treating RN: Carolyne Fiscal, Debi Primary Care Physician: Deborra Medina Other  Clinician: Referring Physician: Deborra Medina Treating Physician/Extender: Frann Rider in Treatment: 14 Active Inactive Electronic Signature(s) Signed: 07/27/2016 4:46:11 PM By: Alric Quan Entered By: Alric Quan on 07/27/2016 15:43:28 Meagan Zavala ([id number]) -------------------------------------------------------------------------------- Pain Assessment Details Patient Name: Meagan Zavala. Date of Service: 07/27/2016 3:30 PM Medical Record Number: [id number] Patient Account Number: [id number] Date of Birth/Sex: 06-22-1946 (70 y.o. Female) Treating RN: Carolyne Fiscal, Debi Primary Care Physician: Deborra Medina Other Clinician: Referring Physician: Deborra Medina Treating Physician/Extender: Frann Rider in Treatment: 14 Active Problems Location of Pain Severity and Description of Pain Patient Has Paino No Site Locations With Dressing Change: No Pain Management and Medication Current Pain Management: Electronic Signature(s) Signed: 07/27/2016 4:46:11 PM By: Alric Quan Entered By: Alric Quan on 07/27/2016 15:35:09 Meagan Zavala ([id number]) -------------------------------------------------------------------------------- Patient/Caregiver Education Details Patient Name: Meagan Zavala. Date of Service: 07/27/2016 3:30 PM Medical Record Number: [id number] Patient Account Number: [id number] Date of Birth/Gender: August 27, 1946 (70 y.o. Female) Treating RN: Meagan Zavala Primary Care Physician: Deborra Medina Other Clinician: Referring Physician: Deborra Medina Treating Physician/Extender: Frann Rider in Treatment: 14 Education Assessment Education Provided To: Patient Education Topics Provided Wound/Skin Impairment: Handouts: Other: If you have any questions or concerns please contact our office. Methods: Demonstration, Explain/Verbal Responses: State content correctly Electronic Signature(s) Signed: 07/27/2016 4:46:11 PM  By: Alric Quan Entered By: Alric Quan on 07/27/2016 15:45:43 Meagan Zavala ([id number]) -------------------------------------------------------------------------------- Wound Assessment Details Patient Name: Meagan Zavala. Date of Service: 07/27/2016 3:30 PM Medical Record Number: [id number] Patient Account Number: [id number] Date of Birth/Sex: 12-07-1945 (70 y.o. Female) Treating RN: Carolyne Fiscal, Debi Primary Care Physician: Deborra Medina Other Clinician: Referring Physician: Deborra Medina Treating Physician/Extender: Frann Rider in Treatment: 14 Wound Status Wound Number: 1 Primary Etiology: Abscess Wound Location: Right Abdomen - midline - Wound Status: Open Medial Comorbid History: Confinement Anxiety Wounding Event: Gradually Appeared Date Acquired: 02/27/2016 Weeks Of Treatment: 14 Clustered Wound: No Photos Wound Measurements Length: (cm) 0 % Reduction Width: (cm) 0 % Reduction Depth: (cm) 0 Epithelializ Area: (cm) 0 Tunneling: Volume: (cm) 0 Undermining in Area: 100% in Volume: 100% ation: Large (67-100%) No : No Wound Description Classification: Partial Thickness Wound Margin: Flat and Intact Exudate Amount: None Present Foul Odor After Cleansing: No Wound Bed Granulation Amount: None Present (0%) Exposed Structure Necrotic Amount: None Present (0%) Fascia Exposed: No Fat Layer Exposed: No Tendon Exposed: No Muscle Exposed: No Joint Exposed: No Meagan Zavala, Meagan  Zavala. ([id number]) Bone Exposed: No Limited to Skin Breakdown Periwound Skin Texture Texture Color No Abnormalities Noted: No No Abnormalities Noted: No Callus: No Atrophie Blanche: No Crepitus: No Cyanosis: No Excoriation: No Ecchymosis: No Fluctuance: No Erythema: No Friable: No Hemosiderin Staining: No Induration: No Mottled: No Localized Edema: No Pallor: No Rash: No Rubor: No Scarring: No Temperature / Pain Moisture Temperature: No Abnormality No  Abnormalities Noted: No Dry / Scaly: No Maceration: No Moist: No Wound Preparation Ulcer Cleansing: Rinsed/Irrigated with Saline Topical Anesthetic Applied: None Electronic Signature(s) Signed: 07/27/2016 4:46:11 PM By: Alric Quan Entered By: Alric Quan on 07/27/2016 15:43:00 Meagan Zavala ([id number]) -------------------------------------------------------------------------------- Vitals Details Patient Name: Meagan Zavala. Date of Service: 07/27/2016 3:30 PM Medical Record Number: [id number] Patient Account Number: [id number] Date of Birth/Sex: [birth year] (70 y.o. Female) Treating RN: Carolyne Fiscal, Debi Primary Care Physician: Deborra Medina Other Clinician: Referring Physician: Deborra Medina Treating Physician/Extender: Frann Rider in Treatment: 14 Vital Signs Time Taken: 15:35 Temperature (F): 98.1 Height (in): 61 Pulse (bpm): 64 Weight (lbs): 125 Respiratory Rate (breaths/min): 16 Body Mass Index (BMI): 23.6 Blood Pressure (mmHg): 117/52 Reference Range: 80 - 120 mg / dl Electronic Signature(s) Signed: 07/27/2016 4:46:11 PM By: Alric Quan Entered By: Alric Quan on 07/27/2016 15:36:45

## 2016-07-28 NOTE — Patient Instructions (Signed)
Iron Sucrose injection  What is this medicine?  IRON SUCROSE (AHY ern SOO krohs) is an iron complex. Iron is used to make healthy red blood cells, which carry oxygen and nutrients throughout the body. This medicine is used to treat iron deficiency anemia in people with chronic kidney disease.  This medicine may be used for other purposes; ask your health care provider or pharmacist if you have questions.  What should I tell my health care provider before I take this medicine?  They need to know if you have any of these conditions:  -anemia not caused by low iron levels  -heart disease  -high levels of iron in the blood  -kidney disease  -liver disease  -an unusual or allergic reaction to iron, other medicines, foods, dyes, or preservatives  -pregnant or trying to get pregnant  -breast-feeding  How should I use this medicine?  This medicine is for infusion into a vein. It is given by a health care professional in a hospital or clinic setting.  Talk to your pediatrician regarding the use of this medicine in children. While this drug may be prescribed for children as young as 2 years for selected conditions, precautions do apply.  Overdosage: If you think you have taken too much of this medicine contact a poison control center or emergency room at once.  NOTE: This medicine is only for you. Do not share this medicine with others.  What if I miss a dose?  It is important not to miss your dose. Call your doctor or health care professional if you are unable to keep an appointment.  What may interact with this medicine?  Do not take this medicine with any of the following medications:  -deferoxamine  -dimercaprol  -other iron products  This medicine may also interact with the following medications:  -chloramphenicol  -deferasirox  This list may not describe all possible interactions. Give your health care provider a list of all the medicines, herbs, non-prescription drugs, or dietary supplements you use. Also tell them if  you smoke, drink alcohol, or use illegal drugs. Some items may interact with your medicine.  What should I watch for while using this medicine?  Visit your doctor or healthcare professional regularly. Tell your doctor or healthcare professional if your symptoms do not start to get better or if they get worse. You may need blood work done while you are taking this medicine.  You may need to follow a special diet. Talk to your doctor. Foods that contain iron include: whole grains/cereals, dried fruits, beans, or peas, leafy green vegetables, and organ meats (liver, kidney).  What side effects may I notice from receiving this medicine?  Side effects that you should report to your doctor or health care professional as soon as possible:  -allergic reactions like skin rash, itching or hives, swelling of the face, lips, or tongue  -breathing problems  -changes in blood pressure  -cough  -fast, irregular heartbeat  -feeling faint or lightheaded, falls  -fever or chills  -flushing, sweating, or hot feelings  -joint or muscle aches/pains  -seizures  -swelling of the ankles or feet  -unusually weak or tired  Side effects that usually do not require medical attention (report to your doctor or health care professional if they continue or are bothersome):  -diarrhea  -feeling achy  -headache  -irritation at site where injected  -nausea, vomiting  -stomach upset  -tiredness  This list may not describe all possible side effects. Call your doctor   for medical advice about side effects. You may report side effects to FDA at 1-800-FDA-1088.  Where should I keep my medicine?  This drug is given in a hospital or clinic and will not be stored at home.  NOTE: This sheet is a summary. It may not cover all possible information. If you have questions about this medicine, talk to your doctor, pharmacist, or health care provider.      2016, [employer]. (2011-06-25 17:14:35)

## 2016-07-30 ENCOUNTER — Encounter: Payer: Self-pay | Admitting: *Deleted

## 2016-07-30 ENCOUNTER — Ambulatory Visit (INDEPENDENT_AMBULATORY_CARE_PROVIDER_SITE_OTHER): Payer: PPO

## 2016-07-30 ENCOUNTER — Inpatient Hospital Stay: Admit: 2016-07-30 | Payer: Self-pay

## 2016-07-30 ENCOUNTER — Ambulatory Visit
Admission: EM | Admit: 2016-07-30 | Discharge: 2016-07-30 | Disposition: A | Discharge status: Home / Self Care | Payer: PPO | Attending: Family Medicine | Admitting: Family Medicine

## 2016-07-30 DIAGNOSIS — S22000A Wedge compression fracture of unspecified thoracic vertebra, initial encounter for closed fracture: Secondary | ICD-10-CM

## 2016-07-30 DIAGNOSIS — R911 Solitary pulmonary nodule: Secondary | ICD-10-CM

## 2016-07-30 DIAGNOSIS — R1032 Left lower quadrant pain: Secondary | ICD-10-CM

## 2016-07-30 LAB — URINALYSIS COMPLETE WITH MICROSCOPIC (ARMC ONLY)
Bilirubin Urine: NEGATIVE
Glucose, UA: NEGATIVE mg/dL
Hgb urine dipstick: NEGATIVE
Nitrite: NEGATIVE
Protein, ur: 30 mg/dL — AB
Specific Gravity, Urine: >1.03 — ABNORMAL HIGH (ref 1.005–1.030)
pH: 5.5 (ref 5.0–8.0)

## 2016-07-30 LAB — CBC WITH DIFFERENTIAL/PLATELET
Basophils Absolute: 0.1 10*3/uL (ref 0–0.1)
Basophils Relative: 1 %
Eosinophils Absolute: 0.1 10*3/uL (ref 0–0.7)
Eosinophils Relative: 1 %
HCT: 33.3 % — ABNORMAL LOW (ref 35.0–47.0)
Hemoglobin: 10.7 g/dL — ABNORMAL LOW (ref 12.0–16.0)
Lymphocytes Relative: 9 %
Lymphs Abs: 0.9 10*3/uL — ABNORMAL LOW (ref 1.0–3.6)
MCH: 27.5 pg (ref 26.0–34.0)
MCHC: 32.2 g/dL (ref 32.0–36.0)
MCV: 85.4 fL (ref 80.0–100.0)
Monocytes Absolute: 0.4 10*3/uL (ref 0.2–0.9)
Monocytes Relative: 4 %
Neutro Abs: 8.4 10*3/uL — ABNORMAL HIGH (ref 1.4–6.5)
Neutrophils Relative %: 85 %
Platelets: 234 10*3/uL (ref 150–440)
RBC: 3.9 MIL/uL (ref 3.80–5.20)
RDW: 21.8 % — ABNORMAL HIGH (ref 11.5–14.5)
WBC: 9.9 10*3/uL (ref 3.6–11.0)

## 2016-07-30 LAB — COMPREHENSIVE METABOLIC PANEL
ALT: 29 U/L (ref 14–54)
AST: 39 U/L (ref 15–41)
Albumin: 4.1 g/dL (ref 3.5–5.0)
Alkaline Phosphatase: 86 U/L (ref 38–126)
Anion gap: 7 (ref 5–15)
BUN: 26 mg/dL — ABNORMAL HIGH (ref 6–20)
CO2: 25 mmol/L (ref 22–32)
Calcium: 9.3 mg/dL (ref 8.9–10.3)
Chloride: 104 mmol/L (ref 101–111)
Creatinine, Ser: 0.9 mg/dL (ref 0.44–1.00)
GFR calc Af Amer: >60 mL/min (ref >60)
GFR calc non Af Amer: >60 mL/min (ref >60)
Glucose, Bld: 102 mg/dL — ABNORMAL HIGH (ref 65–99)
Potassium: 3.9 mmol/L (ref 3.5–5.1)
Sodium: 136 mmol/L (ref 135–145)
Total Bilirubin: 0.4 mg/dL (ref 0.3–1.2)
Total Protein: 7.9 g/dL (ref 6.5–8.1)

## 2016-07-30 MED ORDER — PREDNISONE 10 MG PO TABS: 20.0000 mg | ORAL_TABLET | Freq: Every day | ORAL | 0 refills | Status: DC

## 2016-07-30 MED ORDER — PREDNISONE 10 MG PO TABS: 20.0000 mg | ORAL_TABLET | Freq: Every day | ORAL | 0 refills | Status: AC

## 2016-07-30 NOTE — ED Triage Notes (Signed)
Patient reports symptoms of left flank pain starting 3 days ago. Patient unsure of cause.

## 2016-07-30 NOTE — ED Provider Notes (Signed)
CSN: [id number]     Arrival date & time 07/30/16  1232 History   None    Chief Complaint  Patient presents with  . Flank Pain   (Consider location/radiation/quality/duration/timing/severity/associated sxs/prior Treatment) HPI  This 70 year old female very flat affect presents with a three-day history of left lower abdominal pain that when it is worse radiates to her back. She states she has had no nausea vomiting diarrhea has had no fever or chills. Temperature is 96.8 today. Blood pressure is 100/51 2 sats are 100% on room air. She was seen in the emergency room at [facility] on 07/17/16 for a UTI. The patient is also receiving iron infusions because of iron deficiency anemia. Review of her records it does appear that she has about mild dementia and major depressive disorder. The patient does live by herself.       Past Medical History:  Diagnosis Date  . Anemia   . Arthritis   . Basal cell carcinoma of face   . BULIMIA many decades of this   patient keeps alive by drinking ensure that she cannot purge  . GERD (gastroesophageal reflux disease)   . Hypercholesteremia   . KIDNEY DISEASE, CHRONIC, STAGE III 2010   Cr 1.3 -> .84, probably from NSAID's and lasix  . LEAKAGE, CONTINUOUS URINE 07/30/2009  . LOW BACK PAIN, CHRONIC    takes tylenol  . Macular degeneration disease    [place], now [place]  . Major depressive disorder, recurrent episode, severe ([place])    well treated with SSRI's  . OSTEOPOROSIS    severe, treated with infusions, followed by endocrine in [place]  . Wears dentures    full upper and lower   Past Surgical History:  Procedure Laterality Date  . CARPAL TUNNEL RELEASE    . CATARACT EXTRACTION    . COLON SURGERY  1980   partial colectomy  . COLONOSCOPY WITH PROPOFOL N/A 06/10/2016   Procedure: COLONOSCOPY WITH PROPOFOL;  Surgeon: Manya Silvas, MD;  Location: [facility] ENDOSCOPY;  Service: Endoscopy;  Laterality: N/A;  . ECTROPION REPAIR Bilateral  08/13/2015   Procedure: REPAIR OF ECTROPION;  Surgeon: Karle Starch, MD;  Location: [place];  Service: Ophthalmology;  Laterality: Bilateral;  . ESOPHAGOGASTRODUODENOSCOPY (EGD) WITH PROPOFOL N/A 06/10/2016   Procedure: ESOPHAGOGASTRODUODENOSCOPY (EGD) WITH PROPOFOL;  Surgeon: Manya Silvas, MD;  Location: [facility] ENDOSCOPY;  Service: Endoscopy;  Laterality: N/A;  . 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 PTOSIS REPAIR Bilateral 08/13/2015   Procedure: PTOSIS REPAIR;  Surgeon: Karle Starch, MD;  Location: [place];  Service: Ophthalmology;  Laterality: Bilateral;  . SURGICAL EXCISION OF EXCESSIVE SKIN  1979  . TUBAL LIGATION     Family History  Problem Relation Age of Onset  . Cancer Mother     breast,   . Breast cancer Mother   . Cancer Father     lung  . Cancer Sister     lung, brain  . Cancer Brother     throat   Social History  Substance Use Topics  . Smoking status: Former Smoker    Packs/day: 1.50    Years: 40.00    Quit date: 10/02/2010  . Smokeless tobacco: Never Used  . 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 Negative for chills, fatigue and fever.  Gastrointestinal: Positive for abdominal pain. Negative for constipation, diarrhea, nausea and vomiting.  Musculoskeletal: Positive for back pain.  Psychiatric/Behavioral: Positive for dysphoric mood.  All other systems reviewed and are negative.   Allergies  Review of patient's allergies indicates no known allergies.  Home Medications   Prior to Admission medications   Medication Sig Start Date End Date Taking? Authorizing Provider  aspirin 81 MG tablet Take 1 tablet (81 mg total) by mouth daily. 12/05/13  Yes Crecencio Mc, MD  calcium citrate-vitamin D 200-200 MG-UNIT TABS Take 3 tablets by mouth daily.    Yes  Historical Provider, MD  clonazePAM (KLONOPIN) 0.5 MG tablet 0.25 mg 2 (two) times daily as needed.  11/18/14  Yes Historical Provider, MD  Cyanocobalamin (RA VITAMIN B-12 TR) 1000 MCG TBCR Take 1 tablet by mouth daily.    Yes Historical Provider, MD  docusate sodium (COLACE) 100 MG capsule Take 100 mg by mouth 2 (two) times daily.   Yes Historical Provider, MD  donepezil (ARICEPT) 10 MG tablet Take 10 mg by mouth. 05/12/16  Yes Historical Provider, MD  fish oil-omega-3 fatty acids 1000 MG capsule Take 1 capsule by mouth daily.   Yes Historical Provider, MD  FLUoxetine HCl 60 MG TABS TAKE ONE TABLET BY MOUTH ONCE DAILY 05/25/16  Yes Crecencio Mc, MD  HYDROcodone-acetaminophen (NORCO) 10-325 MG tablet Take 1 tablet by mouth 2 (two) times daily. As needed for severe pain 01/07/16  Yes Crecencio Mc, MD  mirtazapine (REMERON) 15 MG tablet Take 1 tablet (15 mg total) by mouth at bedtime. 11/25/15  Yes Crecencio Mc, MD  Multiple Vitamins-Minerals (ICAPS PO) Take by mouth.   Yes Historical Provider, MD  omeprazole (PRILOSEC) 40 MG capsule TAKE ONE CAPSULE BY MOUTH ONCE DAILY 09/11/15  Yes Crecencio Mc, MD  oxybutynin (DITROPAN XL) 15 MG 24 hr tablet Take 1 tablet (15 mg total) by mouth at bedtime. 04/07/16  Yes Crecencio Mc, MD  pravastatin (PRAVACHOL) 20 MG tablet TAKE ONE TABLET BY MOUTH ONCE DAILY 11/19/15  Yes Crecencio Mc, MD  SYNTHROID 25 MCG tablet Take 25 mcg by mouth daily before breakfast.  03/30/16  Yes Historical Provider, MD  traZODone (DESYREL) 50 MG tablet TAKE ONE TO TWO TABLETS BY MOUTH ONCE DAILY 06/20/15  Yes Crecencio Mc, MD  Vitamin D, Ergocalciferol, (DRISDOL) 50000 UNITS CAPS Take 50,000 Units by mouth every 14 (fourteen) days.   Yes Historical Provider, MD  vitamin E 400 UNIT capsule Take 400 Units by mouth daily. Reported on 04/07/2016   Yes Historical Provider, MD  zoledronic acid (RECLAST) 5 MG/100ML SOLN Every year.   Yes Historical Provider, MD  predniSONE (DELTASONE) 10 MG  tablet Take 2 tablets (20 mg total) by mouth daily. 07/30/16   Lorin Picket, PA-C   Meds Ordered and Administered this Visit  Medications - No data to display  BP (!) 100/51 (BP Location: Right Arm)   Pulse 74   Temp (!) 96.8 F (36 C) (Tympanic)   Resp 14   Ht 5\' 1"  (1.549 m)   Wt 122 lb (55.3 kg)   SpO2 100%   BMI 23.05 kg/m  No data found.   Physical Exam  Constitutional: She is oriented to person, place, and time. She appears well-developed. No distress.  HENT:  Head: Normocephalic and atraumatic.  Eyes: EOM are normal. Pupils are equal, round, and reactive to light.  Neck:  Normal range of motion. Neck supple.  Pulmonary/Chest: She has rales.   examination of the lungs shows decreased respiratory effort. There are crackles scattered over the right lung fields mostly mid and upper not present on the left.  Abdominal: Soft. Bowel sounds are normal. She exhibits no distension and no mass. There is tenderness. There is no rebound and no guarding.  Musculoskeletal: Normal range of motion.  Neurological: She is alert and oriented to person, place, and time.  Skin: Skin is warm and dry. She is not diaphoretic.  Psychiatric: She has a normal mood and affect. Her behavior is normal. Judgment and thought content normal.  Nursing note and vitals reviewed.   Urgent Care Course   Clinical Course    Procedures (including critical care time)  Labs Review Labs Reviewed  CBC WITH DIFFERENTIAL/PLATELET - Abnormal; Notable for the following:       Result Value   Hemoglobin 10.7 (*)    HCT 33.3 (*)    RDW 21.8 (*)    Neutro Abs 8.4 (*)    Lymphs Abs 0.9 (*)    All other components within normal limits  COMPREHENSIVE METABOLIC PANEL - Abnormal; Notable for the following:    Glucose, Bld 102 (*)    BUN 26 (*)    All other components within normal limits  URINALYSIS COMPLETEWITH MICROSCOPIC ([facility] ONLY) - Abnormal; Notable for the following:    APPearance HAZY (*)    Ketones,  ur TRACE (*)    Specific Gravity, Urine >1.030 (*)    Protein, ur 30 (*)    Leukocytes, UA TRACE (*)    Bacteria, UA FEW (*)    Squamous Epithelial / LPF 0-5 (*)    All other components within normal limits    Imaging Review Dg Chest 2 View  Result Date: 07/30/2016 CLINICAL DATA:  Left flank pain EXAM: CHEST  2 VIEW COMPARISON:  08/20/2015 FINDINGS: Cardiomediastinal silhouette is stable. No infiltrate or pulmonary edema. Multiple compression deformities thoracolumbar spine are noted of indeterminate age. Clinical correlation is necessary. There is vague nodular density in right upper lobe measures about 9 mm. Further correlation with CT scan of the chest is recommended to exclude a lung nodule. Surgical clips are noted in left upper abdomen. IMPRESSION: No infiltrate or pulmonary edema. Multiple compression deformities thoracolumbar spine are noted of indeterminate age. Clinical correlation is necessary. There is vague nodular density in right upper lobe measures about 9 mm. Further correlation with CT scan of the chest is recommended to exclude a lung nodule. Electronically Signed   By: Lahoma Crocker M.D.   On: 07/30/2016 14:48     Visual Acuity Review  Right Eye Distance:   Left Eye Distance:   Bilateral Distance:    Right Eye Near:   Left Eye Near:    Bilateral Near:         MDM   1. Fracture of thoracic vertebra, compression, closed, initial encounter ([place])   2. Left lower quadrant pain   3. Pulmonary nodule, right    Discharge Medication List as of 07/30/2016  4:19 PM    START taking these medications   Details  predniSONE (DELTASONE) 10 MG tablet Take 2 tablets (20 mg total) by mouth daily., Starting Thu 07/30/2016, Normal      Plan: 1. Test/x-ray results and diagnosis reviewed with patient 2. rx as per orders; risks, benefits, potential side effects reviewed with patient 3. Recommend supportive treatment with symptom avoidance. He should avoid bending to  decrease  forces over the spine and preventing further collapse. Her left abdominal pain is likely arising from her back. Her physical exam did not seem to indicate any diverticulitis. She has been taking Vicodin for her pain. I will give her some prednisone for a few days since that she doesn't usually take anti-inflammatory medications well. I've discussed and shown her the pulmonary nodule in her right lung and have recommended highly that she make an appointment with her primary care physician next week if he films and further evaluation as they deem necessary. If she worsens today or tonight she should go immediately to the emergency department. Unfortunately the patient does not have any help at home and does not have any relatives. He does have some friends that can check on her. I question if there is an element of dementia and would do better with help at her home. He does have some support from her church but she would have to reach out to them.  4. F/u prn if symptoms worsen or don't improve     Lorin Picket, PA-C 07/30/16 1922

## 2016-07-31 ENCOUNTER — Telehealth: Payer: Self-pay | Admitting: *Deleted

## 2016-07-31 NOTE — Telephone Encounter (Signed)
Pt scheduled  

## 2016-07-31 NOTE — Telephone Encounter (Signed)
November 13 th at 5.00

## 2016-07-31 NOTE — Telephone Encounter (Signed)
Pt stated that she was seen at urgent care on 11/02. She stated that she was told that she a nodule o n her right lung. Please give a time and date to place pt on Dr. Derrel Nip schedule to discuss this issue.  Pt contact [phone]

## 2016-08-04 ENCOUNTER — Inpatient Hospital Stay: Payer: PPO | Attending: Internal Medicine

## 2016-08-04 DIAGNOSIS — K912 Postsurgical malabsorption, not elsewhere classified: Secondary | ICD-10-CM | POA: Insufficient documentation

## 2016-08-04 DIAGNOSIS — I700 Atherosclerosis of aorta: Secondary | ICD-10-CM | POA: Insufficient documentation

## 2016-08-04 DIAGNOSIS — N281 Cyst of kidney, acquired: Secondary | ICD-10-CM | POA: Insufficient documentation

## 2016-08-04 DIAGNOSIS — Z87891 Personal history of nicotine dependence: Secondary | ICD-10-CM | POA: Insufficient documentation

## 2016-08-04 DIAGNOSIS — H3530 Unspecified macular degeneration: Secondary | ICD-10-CM | POA: Insufficient documentation

## 2016-08-04 DIAGNOSIS — N183 Chronic kidney disease, stage 3 (moderate): Secondary | ICD-10-CM | POA: Insufficient documentation

## 2016-08-04 DIAGNOSIS — Z7952 Long term (current) use of systemic steroids: Secondary | ICD-10-CM | POA: Insufficient documentation

## 2016-08-04 DIAGNOSIS — R1032 Left lower quadrant pain: Secondary | ICD-10-CM | POA: Insufficient documentation

## 2016-08-04 DIAGNOSIS — F502 Bulimia nervosa: Secondary | ICD-10-CM | POA: Insufficient documentation

## 2016-08-04 DIAGNOSIS — D500 Iron deficiency anemia secondary to blood loss (chronic): Secondary | ICD-10-CM | POA: Insufficient documentation

## 2016-08-04 DIAGNOSIS — M545 Low back pain: Secondary | ICD-10-CM | POA: Insufficient documentation

## 2016-08-04 DIAGNOSIS — Z85828 Personal history of other malignant neoplasm of skin: Secondary | ICD-10-CM | POA: Insufficient documentation

## 2016-08-04 DIAGNOSIS — G8929 Other chronic pain: Secondary | ICD-10-CM | POA: Insufficient documentation

## 2016-08-04 DIAGNOSIS — K769 Liver disease, unspecified: Secondary | ICD-10-CM | POA: Insufficient documentation

## 2016-08-04 DIAGNOSIS — E7800 Pure hypercholesterolemia, unspecified: Secondary | ICD-10-CM | POA: Insufficient documentation

## 2016-08-04 DIAGNOSIS — R630 Anorexia: Secondary | ICD-10-CM | POA: Insufficient documentation

## 2016-08-04 DIAGNOSIS — Z800 Family history of malignant neoplasm of digestive organs: Secondary | ICD-10-CM | POA: Insufficient documentation

## 2016-08-04 DIAGNOSIS — Z803 Family history of malignant neoplasm of breast: Secondary | ICD-10-CM | POA: Insufficient documentation

## 2016-08-04 DIAGNOSIS — K869 Disease of pancreas, unspecified: Secondary | ICD-10-CM | POA: Insufficient documentation

## 2016-08-04 DIAGNOSIS — K219 Gastro-esophageal reflux disease without esophagitis: Secondary | ICD-10-CM | POA: Insufficient documentation

## 2016-08-04 DIAGNOSIS — Z7982 Long term (current) use of aspirin: Secondary | ICD-10-CM | POA: Insufficient documentation

## 2016-08-04 DIAGNOSIS — Z9884 Bariatric surgery status: Secondary | ICD-10-CM | POA: Insufficient documentation

## 2016-08-04 DIAGNOSIS — M1990 Unspecified osteoarthritis, unspecified site: Secondary | ICD-10-CM | POA: Insufficient documentation

## 2016-08-04 DIAGNOSIS — Z9049 Acquired absence of other specified parts of digestive tract: Secondary | ICD-10-CM | POA: Insufficient documentation

## 2016-08-04 DIAGNOSIS — F1021 Alcohol dependence, in remission: Secondary | ICD-10-CM | POA: Insufficient documentation

## 2016-08-04 DIAGNOSIS — R160 Hepatomegaly, not elsewhere classified: Secondary | ICD-10-CM | POA: Insufficient documentation

## 2016-08-04 DIAGNOSIS — R531 Weakness: Secondary | ICD-10-CM | POA: Insufficient documentation

## 2016-08-04 DIAGNOSIS — Z801 Family history of malignant neoplasm of trachea, bronchus and lung: Secondary | ICD-10-CM | POA: Insufficient documentation

## 2016-08-04 DIAGNOSIS — Z79899 Other long term (current) drug therapy: Secondary | ICD-10-CM | POA: Insufficient documentation

## 2016-08-10 ENCOUNTER — Ambulatory Visit (INDEPENDENT_AMBULATORY_CARE_PROVIDER_SITE_OTHER): Payer: PPO | Admitting: Internal Medicine

## 2016-08-10 ENCOUNTER — Encounter: Payer: Self-pay | Admitting: Internal Medicine

## 2016-08-10 VITALS — BP 98/60 | HR 65 | Temp 97.9°F | Resp 12 | Ht 61.0 in | Wt 120.5 lb

## 2016-08-10 DIAGNOSIS — F331 Major depressive disorder, recurrent, moderate: Secondary | ICD-10-CM

## 2016-08-10 DIAGNOSIS — G252 Other specified forms of tremor: Secondary | ICD-10-CM

## 2016-08-10 DIAGNOSIS — R911 Solitary pulmonary nodule: Secondary | ICD-10-CM | POA: Diagnosis not present

## 2016-08-10 DIAGNOSIS — R109 Unspecified abdominal pain: Secondary | ICD-10-CM

## 2016-08-10 DIAGNOSIS — D508 Other iron deficiency anemias: Secondary | ICD-10-CM | POA: Diagnosis not present

## 2016-08-10 DIAGNOSIS — G3184 Mild cognitive impairment, so stated: Secondary | ICD-10-CM

## 2016-08-10 DIAGNOSIS — R10A0 Flank pain, unspecified side: Secondary | ICD-10-CM

## 2016-08-10 DIAGNOSIS — M8080XA Other osteoporosis with current pathological fracture, unspecified site, initial encounter for fracture: Secondary | ICD-10-CM

## 2016-08-10 LAB — POCT URINALYSIS DIPSTICK
BILIRUBIN UA: NEGATIVE
GLUCOSE UA: NEGATIVE
NITRITE UA: NEGATIVE
PH UA: 6
Urobilinogen, UA: 0.2

## 2016-08-10 NOTE — Patient Instructions (Addendum)
Your flank pain may be coming from your fractured vertebrae or from a kidney stone or a kidney infection  We will know more in 2 days.  No more antibiotics until we have evidence of infection  Please limit your use of vicodin  And clonazepam because they may be causing  You to fall  CT of chest will be ordered  To evaluate the lung nodule   Your anemia IS responding to the iron infusions.  Your most recent hgb is  now 10.6

## 2016-08-10 NOTE — Progress Notes (Signed)
Subjective:  Patient ID: Meagan Zavala, female    DOB: 1946/03/17  Age: 70 y.o. MRN: [id number]  CC: The primary encounter diagnosis was Flank pain. Diagnoses of Lung nodule, solitary, Iron deficiency anemia secondary to inadequate dietary iron intake, Major depressive disorder, recurrent episode, moderate (HCC), MCI (mild cognitive impairment) with memory loss, Other osteoporosis with current pathological fracture, initial encounter, and Intention tremor were also pertinent to this visit.  HPI CHEVI BURKLE presents for follow up on flank pain .  Patient is accompanied by a friend who provides additional history today.  Patient is lethargic , unable to provide histor of recent events including her ER Visits on Oct 20th and Nov 2  . Approximately 40 minutes was spent with patient reviewing records from ER and discussing  Multiple issues;  1) recurrent falls,  X 3.  Per history provided by friend,  She had three falls in a short period of tine due to orthostatic symptoms and was seen at Urgent Care on Oct 20 and subsequently sent to ER for evaluation of potential head trauma.  Told she had a fracture of  A thoracic vertebrae, which she does not recall. She does have a history of osteoporosos currently treated with Reclast infusions annually by Dr. Dareen Piano.   2) generalized weakness and presyncope .  Seen in ER Oct 20th  After presenting to Urgent Care with  Hypotension.  Treated for UTI keflex 500 mg bid and give IV fluids for  dehydration .  hgb was stable.     3) Iron deficiency anemia. She reports that she missed an iron infusion last week because she was too weak to leave the house.  Reviewed serial hgb measurements which reflect improved anemia, last hgb 10.6   4) Pulmonary nodule:  Seen on chest x ray done Nov 2 for evaluation of flank pain.  CT scan of chest neede as she is an x smoker to eval 9 mm RUL vague nodule  . Discussed getting CT ordered for Afternoons  Not Wednesday  5) Multiple  Neurologic complaints including dementia and tremors.  She has regular follow up with [facility] neurologist Dr. Melrose Nakayama,  Her gabapentin dose was increased  To 100 mg tid    6) left flank pain   Aching throbbing intermittent  for two weeks,  Aggravated by work activities. Improves with rest . No early morning symptoms.  No nausea , but dry heaves twicd twice in the past 2 weeks  After taking medications on empty stomach.  History of eating disorder with chronic avoidance and restrictive habits. . Noted to have an acute appearing thoracic vertebral fracture on chest x ray done during  Nov 2 ER visit .      Outpatient Medications Prior to Visit  Medication Sig Dispense Refill  . aspirin 81 MG tablet Take 1 tablet (81 mg total) by mouth daily. 30 tablet 11  . calcium citrate-vitamin D 200-200 MG-UNIT TABS Take 3 tablets by mouth daily.     . clonazePAM (KLONOPIN) 0.5 MG tablet 0.25 mg 2 (two) times daily as needed.   0  . Cyanocobalamin (RA VITAMIN B-12 TR) 1000 MCG TBCR Take 1 tablet by mouth daily.     Marland Kitchen docusate sodium (COLACE) 100 MG capsule Take 100 mg by mouth 2 (two) times daily.    Marland Kitchen donepezil (ARICEPT) 10 MG tablet Take 10 mg by mouth.    . fish oil-omega-3 fatty acids 1000 MG capsule Take 1 capsule by mouth daily.    Marland Kitchen  FLUoxetine HCl 60 MG TABS TAKE ONE TABLET BY MOUTH ONCE DAILY 90 tablet 1  . HYDROcodone-acetaminophen (NORCO) 10-325 MG tablet Take 1 tablet by mouth 2 (two) times daily. As needed for severe pain 60 tablet 0  . mirtazapine (REMERON) 15 MG tablet Take 1 tablet (15 mg total) by mouth at bedtime. 90 tablet 1  . Multiple Vitamins-Minerals (ICAPS PO) Take by mouth.    Marland Kitchen omeprazole (PRILOSEC) 40 MG capsule TAKE ONE CAPSULE BY MOUTH ONCE DAILY 90 capsule 2  . oxybutynin (DITROPAN XL) 15 MG 24 hr tablet Take 1 tablet (15 mg total) by mouth at bedtime. 90 tablet 2  . pravastatin (PRAVACHOL) 20 MG tablet TAKE ONE TABLET BY MOUTH ONCE DAILY 90 tablet 3  . predniSONE (DELTASONE) 10  MG tablet Take 2 tablets (20 mg total) by mouth daily. 6 tablet 0  . SYNTHROID 25 MCG tablet Take 25 mcg by mouth daily before breakfast.     . traZODone (DESYREL) 50 MG tablet TAKE ONE TO TWO TABLETS BY MOUTH ONCE DAILY 180 tablet 0  . Vitamin D, Ergocalciferol, (DRISDOL) 50000 UNITS CAPS Take 50,000 Units by mouth every 14 (fourteen) days.    . vitamin E 400 UNIT capsule Take 400 Units by mouth daily. Reported on 04/07/2016    . zoledronic acid (RECLAST) 5 MG/100ML SOLN Every year.     No facility-administered medications prior to visit.     Review of Systems;  Patient denies headache, fevers, , unintentional weight loss, skin rash, eye pain, sinus congestion and sinus pain, sore throat, dysphagia,  hemoptysis , cough, dyspnea, wheezing, chest pain, palpitations, orthopnea, edema, abdominal pain, nausea, melena, diarrhea, constipation,  dysuria, hematuria, urinary  Frequency, nocturia, numbness, tingling, seizures,  , Loss of consciousness,  , insomnia, , anxiety, and suicidal ideation.      Objective:  BP 98/60   Pulse 65   Temp 97.9 F (36.6 C) (Oral)   Resp 12   Ht 5\' 1"  (1.549 m)   Wt 120 lb 8 oz (54.7 kg)   SpO2 99%   BMI 22.77 kg/m   BP Readings from Last 3 Encounters:  08/10/16 98/60  07/30/16 (!) 100/51  07/28/16 107/69    Wt Readings from Last 3 Encounters:  08/10/16 120 lb 8 oz (54.7 kg)  07/30/16 122 lb (55.3 kg)  07/17/16 122 lb (55.3 kg)    General appearance: lethargic but  cooperative and appears older than stated age Neck: no adenopathy, no carotid bruit, supple, symmetrical, trachea midline and thyroid not enlarged, symmetric, no tenderness/mass/nodules Back: symmetric, no curvature. ROM normal. No CVA tenderness. Lungs:kyphotic,  clear to auscultation bilaterally Heart: regular rate and rhythm, S1, S2 normal, no murmur, click, rub or gallop Abdomen: soft, non-tender; bowel sounds normal; no masses,  no organomegaly Pulses: 2+ and symmetric Skin: Skin  color, texture, turgor normal. No rashes or lesions Lymph nodes: Cervical, supraclavicular, and axillary nodes normal. Psych: affect flat, makes good eye contact. No fidgeting,  Bradykinetic.   Denies suicidal thoughts    No results found for: HGBA1C  Lab Results  Component Value Date   CREATININE 0.90 07/30/2016   CREATININE 1.06 (H) 07/17/2016   CREATININE 0.69 10/15/2015    Lab Results  Component Value Date   WBC 9.9 07/30/2016   HGB 10.7 (L) 07/30/2016   HCT 33.3 (L) 07/30/2016   PLT 234 07/30/2016   GLUCOSE 102 (H) 07/30/2016   CHOL 141 11/09/2013   TRIG 40.0 11/09/2013   HDL 68.20 11/09/2013  LDLCALC 65 11/09/2013   ALT 29 07/30/2016   AST 39 07/30/2016   NA 136 07/30/2016   K 3.9 07/30/2016   CL 104 07/30/2016   CREATININE 0.90 07/30/2016   BUN 26 (H) 07/30/2016   CO2 25 07/30/2016   TSH 1.999 07/17/2016    Dg Chest 2 View  Result Date: 07/30/2016 CLINICAL DATA:  Left flank pain EXAM: CHEST  2 VIEW COMPARISON:  08/20/2015 FINDINGS: Cardiomediastinal silhouette is stable. No infiltrate or pulmonary edema. Multiple compression deformities thoracolumbar spine are noted of indeterminate age. Clinical correlation is necessary. There is vague nodular density in right upper lobe measures about 9 mm. Further correlation with CT scan of the chest is recommended to exclude a lung nodule. Surgical clips are noted in left upper abdomen. IMPRESSION: No infiltrate or pulmonary edema. Multiple compression deformities thoracolumbar spine are noted of indeterminate age. Clinical correlation is necessary. There is vague nodular density in right upper lobe measures about 9 mm. Further correlation with CT scan of the chest is recommended to exclude a lung nodule. Electronically Signed   By: Lahoma Crocker M.D.   On: 07/30/2016 14:48    Assessment & Plan:   Problem List Items Addressed This Visit    Osteoporosis (Chronic)    With recent thoracic vertebral fracture noted on chest x ray.   Flank pain currently attributed to fracture.  Will need MRI to rule out spinal cord compression      Major depressive disorder, recurrent episode, moderate (HCC)    Worsening despite useof proazac and trazodone, since diagnosis of MCI.  Referral to psychiatry advised       MCI (mild cognitive impairment) with memory loss    Worsening, with inability to recall events of recent ER visits.  Patient lives alone ,  Had Sports coach.  Has some emotional support of friends and church but her local family consists of nieces and nephews who have been characterized for years as free loading and  Manipulative by patient.        Intention tremor    Managed with increased gabapentin dose by Neurology which may be contributing to her acute confusional/lethargic state. Will recommend decreasing her dose       IDA (iron deficiency anemia)    Secondary to gastric bypass. First noted in 2016 improving with iron infusions,  Last colonoscopy was  Normal Sept 2017 along with EGD.   Lab Results  Component Value Date   WBC 9.9 07/30/2016   HGB 10.7 (L) 07/30/2016   HCT 33.3 (L) 07/30/2016   MCV 85.4 07/30/2016   PLT 234 07/30/2016     Lab Results  Component Value Date   IRON 36 06/30/2016   TIBC 384 06/30/2016   FERRITIN 121 06/30/2016   . Lab Results  Component Value Date   VITAMINB12 >1500 (H) 11/25/2015   No results found for: FOLATE        Lung nodule, solitary    concerning for CA given her past history of tobacco abuse.,  CT chest ordered.       Relevant Orders   CT Chest Wo Contrast    Other Visit Diagnoses    Flank pain    -  Primary   Relevant Orders   POCT Urinalysis Dipstick (Completed)   Urine Culture   Urinalysis, Routine w reflex microscopic (Completed)      I am having Ms. Ysidro Evert maintain her zoledronic acid, Vitamin D (Ergocalciferol), calcium citrate-vitamin D, Multiple Vitamins-Minerals (ICAPS PO), fish  oil-omega-3 fatty acids, vitamin E, aspirin,  clonazePAM, traZODone, docusate sodium, omeprazole, pravastatin, mirtazapine, HYDROcodone-acetaminophen, SYNTHROID, oxybutynin, FLUoxetine HCl, donepezil, Cyanocobalamin, predniSONE, and gabapentin.  Meds ordered this encounter  Medications  . gabapentin (NEURONTIN) 100 MG capsule    Sig: Take 100 mg by mouth 4 (four) times daily.    There are no discontinued medications.  Follow-up: Return in about 4 weeks (around 09/07/2016).   Crecencio Mc, MD

## 2016-08-10 NOTE — Progress Notes (Signed)
Pre-visit discussion using our clinic review tool. No additional management support is needed unless otherwise documented below in the visit note.  

## 2016-08-11 ENCOUNTER — Inpatient Hospital Stay: Payer: PPO | Admitting: Oncology

## 2016-08-11 ENCOUNTER — Ambulatory Visit: Payer: PPO | Attending: Oncology

## 2016-08-11 ENCOUNTER — Other Ambulatory Visit: Payer: Self-pay

## 2016-08-11 DIAGNOSIS — Z87891 Personal history of nicotine dependence: Secondary | ICD-10-CM | POA: Insufficient documentation

## 2016-08-11 HISTORY — DX: Personal history of nicotine dependence: Z87.891

## 2016-08-11 LAB — URINALYSIS, ROUTINE W REFLEX MICROSCOPIC
Bilirubin Urine: NEGATIVE
Nitrite: NEGATIVE
SPECIFIC GRAVITY, URINE: 1.025 (ref 1.000–1.030)
TOTAL PROTEIN, URINE-UPE24: 30 — AB
URINE GLUCOSE: NEGATIVE
UROBILINOGEN UA: 0.2 (ref 0.0–1.0)
pH: 6 (ref 5.0–8.0)

## 2016-08-11 NOTE — Assessment & Plan Note (Signed)
Managed with increased gabapentin dose by Neurology which may be contributing to her acute confusional/lethargic state. Will recommend decreasing her dose

## 2016-08-11 NOTE — Assessment & Plan Note (Signed)
With recent thoracic vertebral fracture noted on chest x ray.  Flank pain currently attributed to fracture.  Will need MRI to rule out spinal cord compression

## 2016-08-11 NOTE — Assessment & Plan Note (Signed)
Worsening despite useof proazac and trazodone, since diagnosis of MCI.  Referral to psychiatry advised

## 2016-08-11 NOTE — Assessment & Plan Note (Addendum)
Secondary to gastric bypass. First noted in 2016 improving with iron infusions,  Last colonoscopy was  Normal Sept 2017 along with EGD.   Lab Results  Component Value Date   WBC 9.9 07/30/2016   HGB 10.7 (L) 07/30/2016   HCT 33.3 (L) 07/30/2016   MCV 85.4 07/30/2016   PLT 234 07/30/2016     Lab Results  Component Value Date   IRON 36 06/30/2016   TIBC 384 06/30/2016   FERRITIN 121 06/30/2016   . Lab Results  Component Value Date   VITAMINB12 >1500 (H) 11/25/2015   No results found for: FOLATE

## 2016-08-11 NOTE — Assessment & Plan Note (Signed)
Worsening, with inability to recall events of recent ER visits.  Patient lives alone ,  Had Sports coach.  Has some emotional support of friends and church but her local family consists of nieces and nephews who have been characterized for years as free loading and  Manipulative by patient.

## 2016-08-11 NOTE — Assessment & Plan Note (Signed)
concerning for CA given her past history of tobacco abuse.,  CT chest ordered.

## 2016-08-12 ENCOUNTER — Ambulatory Visit: Payer: PPO

## 2016-08-12 ENCOUNTER — Ambulatory Visit: Payer: PPO | Admitting: Internal Medicine

## 2016-08-13 ENCOUNTER — Inpatient Hospital Stay: Payer: PPO

## 2016-08-13 VITALS — BP 110/72 | HR 70 | Temp 96.4°F | Resp 16

## 2016-08-13 DIAGNOSIS — R531 Weakness: Secondary | ICD-10-CM | POA: Diagnosis not present

## 2016-08-13 DIAGNOSIS — D509 Iron deficiency anemia, unspecified: Secondary | ICD-10-CM

## 2016-08-13 DIAGNOSIS — Z7952 Long term (current) use of systemic steroids: Secondary | ICD-10-CM | POA: Diagnosis not present

## 2016-08-13 DIAGNOSIS — N183 Chronic kidney disease, stage 3 (moderate): Secondary | ICD-10-CM | POA: Diagnosis not present

## 2016-08-13 DIAGNOSIS — I700 Atherosclerosis of aorta: Secondary | ICD-10-CM | POA: Diagnosis not present

## 2016-08-13 DIAGNOSIS — N281 Cyst of kidney, acquired: Secondary | ICD-10-CM | POA: Diagnosis not present

## 2016-08-13 DIAGNOSIS — E7800 Pure hypercholesterolemia, unspecified: Secondary | ICD-10-CM | POA: Diagnosis not present

## 2016-08-13 DIAGNOSIS — K219 Gastro-esophageal reflux disease without esophagitis: Secondary | ICD-10-CM | POA: Diagnosis not present

## 2016-08-13 DIAGNOSIS — Z7982 Long term (current) use of aspirin: Secondary | ICD-10-CM | POA: Diagnosis not present

## 2016-08-13 DIAGNOSIS — G8929 Other chronic pain: Secondary | ICD-10-CM | POA: Diagnosis not present

## 2016-08-13 DIAGNOSIS — R1032 Left lower quadrant pain: Secondary | ICD-10-CM | POA: Diagnosis not present

## 2016-08-13 DIAGNOSIS — D500 Iron deficiency anemia secondary to blood loss (chronic): Secondary | ICD-10-CM | POA: Diagnosis not present

## 2016-08-13 DIAGNOSIS — M1990 Unspecified osteoarthritis, unspecified site: Secondary | ICD-10-CM | POA: Diagnosis not present

## 2016-08-13 DIAGNOSIS — F502 Bulimia nervosa: Secondary | ICD-10-CM | POA: Diagnosis not present

## 2016-08-13 DIAGNOSIS — F1021 Alcohol dependence, in remission: Secondary | ICD-10-CM | POA: Diagnosis not present

## 2016-08-13 DIAGNOSIS — K912 Postsurgical malabsorption, not elsewhere classified: Secondary | ICD-10-CM | POA: Diagnosis not present

## 2016-08-13 DIAGNOSIS — K769 Liver disease, unspecified: Secondary | ICD-10-CM | POA: Diagnosis not present

## 2016-08-13 DIAGNOSIS — M545 Low back pain: Secondary | ICD-10-CM | POA: Diagnosis not present

## 2016-08-13 DIAGNOSIS — Z9884 Bariatric surgery status: Secondary | ICD-10-CM | POA: Diagnosis not present

## 2016-08-13 DIAGNOSIS — Z87891 Personal history of nicotine dependence: Secondary | ICD-10-CM | POA: Diagnosis not present

## 2016-08-13 DIAGNOSIS — K869 Disease of pancreas, unspecified: Secondary | ICD-10-CM | POA: Diagnosis present

## 2016-08-13 DIAGNOSIS — H3530 Unspecified macular degeneration: Secondary | ICD-10-CM | POA: Diagnosis not present

## 2016-08-13 DIAGNOSIS — R630 Anorexia: Secondary | ICD-10-CM | POA: Diagnosis not present

## 2016-08-13 DIAGNOSIS — R160 Hepatomegaly, not elsewhere classified: Secondary | ICD-10-CM | POA: Diagnosis not present

## 2016-08-13 DIAGNOSIS — Z79899 Other long term (current) drug therapy: Secondary | ICD-10-CM | POA: Diagnosis not present

## 2016-08-13 MED ORDER — SODIUM CHLORIDE 0.9 % IV SOLN: 200.0000 mg | Freq: Once | INTRAVENOUS | Status: DC

## 2016-08-13 MED ORDER — IRON SUCROSE 20 MG/ML IV SOLN
200.0000 mg | Freq: Once | INTRAVENOUS | Status: AC
  Administered 2016-08-13: 200 mg via INTRAVENOUS
  Filled 2016-08-13: qty 10

## 2016-08-14 LAB — URINE CULTURE

## 2016-08-15 MED ORDER — CIPROFLOXACIN HCL 250 MG PO TABS: 250.0000 mg | ORAL_TABLET | Freq: Two times a day (BID) | ORAL | 0 refills | Status: DC

## 2016-08-15 NOTE — Addendum Note (Signed)
Addended by: Crecencio Mc on: 08/15/2016 11:14 AM   Modules accepted: Orders

## 2016-08-16 ENCOUNTER — Ambulatory Visit
Admission: EM | Admit: 2016-08-16 | Discharge: 2016-08-16 | Disposition: A | Discharge status: Home / Self Care | Payer: PPO | Attending: Family Medicine | Admitting: Family Medicine

## 2016-08-16 ENCOUNTER — Encounter: Payer: Self-pay | Admitting: Emergency Medicine

## 2016-08-16 DIAGNOSIS — R1032 Left lower quadrant pain: Secondary | ICD-10-CM | POA: Diagnosis not present

## 2016-08-16 NOTE — ED Triage Notes (Signed)
Patient c/o left sided lower abdominal pain that started on Friday.  Patient denies fevers. Patient reports nausea.

## 2016-08-16 NOTE — Discharge Instructions (Signed)
Due to uncertain cause of abdominal pain and nausea, recommend go to ER for IV hydration and further testing and imaging (ultrasound or CT scan).

## 2016-08-17 ENCOUNTER — Emergency Department: Payer: PPO

## 2016-08-17 ENCOUNTER — Telehealth: Payer: Self-pay | Admitting: Internal Medicine

## 2016-08-17 ENCOUNTER — Emergency Department
Admission: EM | Admit: 2016-08-17 | Discharge: 2016-08-17 | Disposition: A | Discharge status: Home / Self Care | Payer: PPO | Attending: Emergency Medicine | Admitting: Emergency Medicine

## 2016-08-17 ENCOUNTER — Ambulatory Visit: Admission: RE | Admit: 2016-08-17 | Payer: PPO | Source: Ambulatory Visit

## 2016-08-17 ENCOUNTER — Encounter: Payer: Self-pay | Admitting: *Deleted

## 2016-08-17 DIAGNOSIS — E039 Hypothyroidism, unspecified: Secondary | ICD-10-CM | POA: Insufficient documentation

## 2016-08-17 DIAGNOSIS — Z7982 Long term (current) use of aspirin: Secondary | ICD-10-CM | POA: Diagnosis not present

## 2016-08-17 DIAGNOSIS — Z79899 Other long term (current) drug therapy: Secondary | ICD-10-CM | POA: Diagnosis not present

## 2016-08-17 DIAGNOSIS — R109 Unspecified abdominal pain: Secondary | ICD-10-CM

## 2016-08-17 DIAGNOSIS — N390 Urinary tract infection, site not specified: Secondary | ICD-10-CM

## 2016-08-17 DIAGNOSIS — Z87891 Personal history of nicotine dependence: Secondary | ICD-10-CM | POA: Insufficient documentation

## 2016-08-17 DIAGNOSIS — K869 Disease of pancreas, unspecified: Secondary | ICD-10-CM | POA: Insufficient documentation

## 2016-08-17 DIAGNOSIS — K8689 Other specified diseases of pancreas: Secondary | ICD-10-CM

## 2016-08-17 DIAGNOSIS — R1032 Left lower quadrant pain: Secondary | ICD-10-CM | POA: Diagnosis present

## 2016-08-17 DIAGNOSIS — N183 Chronic kidney disease, stage 3 (moderate): Secondary | ICD-10-CM | POA: Insufficient documentation

## 2016-08-17 LAB — COMPREHENSIVE METABOLIC PANEL
ALBUMIN: 3.7 g/dL (ref 3.5–5.0)
ALT: 25 U/L (ref 14–54)
ANION GAP: 8 (ref 5–15)
AST: 36 U/L (ref 15–41)
Alkaline Phosphatase: 91 U/L (ref 38–126)
BUN: 25 mg/dL — AB (ref 6–20)
CHLORIDE: 102 mmol/L (ref 101–111)
CO2: 27 mmol/L (ref 22–32)
Calcium: 9.7 mg/dL (ref 8.9–10.3)
Creatinine, Ser: 0.93 mg/dL (ref 0.44–1.00)
GFR calc Af Amer: >60 mL/min (ref >60)
GFR calc non Af Amer: 60 mL/min — ABNORMAL LOW (ref >60)
GLUCOSE: 106 mg/dL — AB (ref 65–99)
POTASSIUM: 3.5 mmol/L (ref 3.5–5.1)
Sodium: 137 mmol/L (ref 135–145)
Total Bilirubin: 0.4 mg/dL (ref 0.3–1.2)
Total Protein: 7.6 g/dL (ref 6.5–8.1)

## 2016-08-17 LAB — CBC
HEMATOCRIT: 31.9 % — AB (ref 35.0–47.0)
HEMOGLOBIN: 10.7 g/dL — AB (ref 12.0–16.0)
MCH: 29.3 pg (ref 26.0–34.0)
MCHC: 33.7 g/dL (ref 32.0–36.0)
MCV: 87.2 fL (ref 80.0–100.0)
Platelets: 215 10*3/uL (ref 150–440)
RBC: 3.66 MIL/uL — ABNORMAL LOW (ref 3.80–5.20)
RDW: 21.5 % — AB (ref 11.5–14.5)
WBC: 4.8 10*3/uL (ref 3.6–11.0)

## 2016-08-17 LAB — URINALYSIS COMPLETE WITH MICROSCOPIC (ARMC ONLY)
Bilirubin Urine: NEGATIVE
GLUCOSE, UA: NEGATIVE mg/dL
HGB URINE DIPSTICK: NEGATIVE
Ketones, ur: NEGATIVE mg/dL
Nitrite: NEGATIVE
PH: 5 (ref 5.0–8.0)
PROTEIN: NEGATIVE mg/dL
Specific Gravity, Urine: 1.01 (ref 1.005–1.030)

## 2016-08-17 LAB — LIPASE, BLOOD: LIPASE: 30 U/L (ref 11–51)

## 2016-08-17 MED ORDER — IOPAMIDOL (ISOVUE-300) INJECTION 61%
75.0000 mL | Freq: Once | INTRAVENOUS | Status: AC | PRN
  Administered 2016-08-17: 75 mL via INTRAVENOUS

## 2016-08-17 MED ORDER — CEFTRIAXONE SODIUM-DEXTROSE 1-3.74 GM-% IV SOLR
1.0000 g | Freq: Once | INTRAVENOUS | Status: AC
  Administered 2016-08-17: 1 g via INTRAVENOUS
  Filled 2016-08-17: qty 50

## 2016-08-17 MED ORDER — TRAMADOL HCL 50 MG PO TABS: 50.0000 mg | ORAL_TABLET | Freq: Four times a day (QID) | ORAL | 0 refills | Status: AC | PRN

## 2016-08-17 MED ORDER — IOPAMIDOL (ISOVUE-300) INJECTION 61%
15.0000 mL | INTRAVENOUS | Status: AC
  Administered 2016-08-17: 15 mL via ORAL

## 2016-08-17 MED ORDER — DEXTROSE 5 % IV SOLN: 1.0000 g | Freq: Once | INTRAVENOUS | Status: DC

## 2016-08-17 MED ORDER — CEPHALEXIN 500 MG PO CAPS: 500.0000 mg | ORAL_CAPSULE | Freq: Three times a day (TID) | ORAL | 0 refills | Status: DC

## 2016-08-17 MED ORDER — MORPHINE SULFATE (PF) 4 MG/ML IV SOLN
2.0000 mg | Freq: Once | INTRAVENOUS | Status: AC
  Administered 2016-08-17: 2 mg via INTRAVENOUS
  Filled 2016-08-17: qty 1

## 2016-08-17 MED ORDER — SODIUM CHLORIDE 0.9 % IV BOLUS (SEPSIS)
500.0000 mL | Freq: Once | INTRAVENOUS | Status: AC
  Administered 2016-08-17: 500 mL via INTRAVENOUS

## 2016-08-17 NOTE — ED Triage Notes (Addendum)
States lower abd pain, LLQ for several days, states vomiting, states little PO intake, pt awake and alert, states nodules on her lungs and is to have CT scan tomorrow, states she saw Dr. Lacinda Axon today and was sent to ER for CT scan of abd

## 2016-08-17 NOTE — ED Provider Notes (Signed)
[facility] Emergency Department Provider Note    ____________________________________________   I have reviewed the triage vital signs and the nursing notes.   HISTORY  Chief Complaint Abdominal Pain   History limited by: Not Limited   HPI LEIGHNA WORMAN is a 70 y.o. female who presents to the emergency department today with concerns for left lower quadrant abdominal pain. It is been going on for the past few weeks. It does somewhat come and go. She states that it is somewhat reminiscent of when she has had a small bowel obstruction the past. She states her last bowel movement was a few days ago. She has not been passing gas. Patient has had some nausea with this pain. Denies any fevers.   Past Medical History:  Diagnosis Date  . Anemia   . Arthritis   . Basal cell carcinoma of face   . BULIMIA many decades of this   patient keeps alive by drinking ensure that she cannot purge  . GERD (gastroesophageal reflux disease)   . Hypercholesteremia   . KIDNEY DISEASE, CHRONIC, STAGE III 2010   Cr 1.3 -> .84, probably from NSAID's and lasix  . LEAKAGE, CONTINUOUS URINE 07/30/2009  . LOW BACK PAIN, CHRONIC    takes tylenol  . Macular degeneration disease    Dingledein, now [place]eller  . Major depressive disorder, recurrent episode, severe ([place])    well treated with SSRI's  . OSTEOPOROSIS    severe, treated with infusions, followed by endocrine in [place]  . Personal history of tobacco use, presenting hazards to health 08/11/2016  . Wears dentures    full upper and lower    Patient Active Problem List   Diagnosis Date Noted  . Personal history of tobacco use, presenting hazards to health 08/11/2016  . Lung nodule, solitary 08/10/2016  . IDA (iron deficiency anemia) 06/09/2016  . Vitamin D deficiency 04/09/2016  . Hypothyroidism 04/09/2016  . Intention tremor 01/10/2016  . Anemia of chronic disease 11/27/2015  . Fatty liver disease,  nonalcoholic [id number]  . Other and unspecified hyperlipidemia 09/01/2013  . Medicare annual wellness visit, subsequent 09/16/2012  . Polyarthritis 04/16/2012  . Macular degeneration disease   . Neck pain 03/03/2011  . Insomnia 11/25/2010  . MCI (mild cognitive impairment) with memory loss 11/18/2010  . BULIMIA 07/30/2009  . Major depressive disorder, recurrent episode, moderate ([place]) 07/30/2009  . GERD 07/30/2009  . KIDNEY DISEASE, CHRONIC, STAGE III 07/30/2009  . LOW BACK PAIN, CHRONIC 07/30/2009  . Osteoporosis 07/30/2009  . Urinary incontinence 07/30/2009    Past Surgical History:  Procedure Laterality Date  . CARPAL TUNNEL RELEASE    . CATARACT EXTRACTION    . COLON SURGERY  1980   partial colectomy  . COLONOSCOPY WITH PROPOFOL N/A 06/10/2016   Procedure: COLONOSCOPY WITH PROPOFOL;  Surgeon: Manya Silvas, MD;  [facility] END[facility]COPY;  Service: Endoscopy;  Laterality: N/A;  . ECTROPION REPAIR Bilateral 08/13/2015   Procedure: REPAIR OF ECTROPION;  Surgeon: Karle Starch, MD;  Location: [place];  Service: Ophthalmology;  Laterality: Bilateral;  . ESOPHAGOGASTRODUODENOSCOPY (EGD) WITH PROPOFOL N/A 06/10/2016   Procedure: ESOPHAGOGASTRODUODENOSCOPY (EGD) WITH PROPOFOL;  Surgeon: Manya Silvas, MD;  Location: [facility] ENDOSCOPY;  Service: Endoscopy;  Laterality: N/A;  . 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 PTOSIS REPAIR Bilateral 08/13/2015   Procedure: PTOSIS REPAIR;  Surgeon: Karle Starch, MD;  Location: [place];  Service: Ophthalmology;  Laterality: Bilateral;  .  SURGICAL EXCISION OF EXCESSIVE SKIN  1979  . TUBAL LIGATION      Prior to Admission medications   Medication Sig Start Date End Date Taking? Authorizing Provider  aspirin 81 MG tablet Take 1 tablet (81 mg total) by mouth daily. 12/05/13   Crecencio Mc, MD  calcium citrate-vitamin D 200-200 MG-UNIT TABS Take 3 tablets by mouth daily.     Historical Provider, MD   clonazePAM (KLONOPIN) 0.5 MG tablet 0.25 mg 2 (two) times daily as needed.  11/18/14   Historical Provider, MD  Cyanocobalamin (RA VITAMIN B-12 TR) 1000 MCG TBCR Take 1 tablet by mouth daily.     Historical Provider, MD  docusate sodium (COLACE) 100 MG capsule Take 100 mg by mouth 2 (two) times daily.    Historical Provider, MD  donepezil (ARICEPT) 10 MG tablet Take 10 mg by mouth. 05/12/16   Historical Provider, MD  fish oil-omega-3 fatty acids 1000 MG capsule Take 1 capsule by mouth daily.    Historical Provider, MD  FLUoxetine HCl 60 MG TABS TAKE ONE TABLET BY MOUTH ONCE DAILY 05/25/16   Crecencio Mc, MD  gabapentin (NEURONTIN) 100 MG capsule Take 100 mg by mouth 4 (four) times daily.    Historical Provider, MD  HYDROcodone-acetaminophen (NORCO) 10-325 MG tablet Take 1 tablet by mouth 2 (two) times daily. As needed for severe pain 01/07/16   Crecencio Mc, MD  mirtazapine (REMERON) 15 MG tablet Take 1 tablet (15 mg total) by mouth at bedtime. 11/25/15   Crecencio Mc, MD  Multiple Vitamins-Minerals (ICAPS PO) Take by mouth.    Historical Provider, MD  omeprazole (PRILOSEC) 40 MG capsule TAKE ONE CAPSULE BY MOUTH ONCE DAILY 09/11/15   Crecencio Mc, MD  oxybutynin (DITROPAN XL) 15 MG 24 hr tablet Take 1 tablet (15 mg total) by mouth at bedtime. 04/07/16   Crecencio Mc, MD  pravastatin (PRAVACHOL) 20 MG tablet TAKE ONE TABLET BY MOUTH ONCE DAILY 11/19/15   Crecencio Mc, MD  predniSONE (DELTASONE) 10 MG tablet Take 2 tablets (20 mg total) by mouth daily. 07/30/16   Lorin Picket, PA-C  SYNTHROID 25 MCG tablet Take 25 mcg by mouth daily before breakfast.  03/30/16   Historical Provider, MD  traZODone (DESYREL) 50 MG tablet TAKE ONE TO TWO TABLETS BY MOUTH ONCE DAILY 06/20/15   Crecencio Mc, MD  Vitamin D, Ergocalciferol, (DRISDOL) 50000 UNITS CAPS Take 50,000 Units by mouth every 14 (fourteen) days.    Historical Provider, MD  vitamin E 400 UNIT capsule Take 400 Units by mouth daily. Reported on  04/07/2016    Historical Provider, MD  zoledronic acid (RECLAST) 5 MG/100ML SOLN Every year.    Historical Provider, MD    Allergies Patient has no known allergies.  Family History  Problem Relation Age of Onset  . Cancer Mother     breast,   . Breast cancer Mother   . Cancer Father     lung  . Cancer Sister     lung, brain  . Cancer Brother     throat    Social History Social History  Substance Use Topics  . Smoking status: Former Smoker    Packs/day: 1.50    Years: 40.00    Quit date: 10/02/2010  . Smokeless tobacco: Never Used  . 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 Cardiovascular: Negative for chest pain. Respiratory: Negative for shortness of breath. Gastrointestinal: Positive for abdominal pain. Genitourinary: Negative for dysuria. Musculoskeletal: Negative for back pain. Skin: Negative for rash. Neurological: Negative for headaches, focal weakness or numbness.  10-point ROS otherwise negative.  ____________________________________________   PHYSICAL EXAM:  VITAL SIGNS: ED Triage Vitals [08/17/16 1409]  Enc Vitals Group     BP 112/68     Pulse Rate 61     Resp 18     Temp 97.5 F (36.4 C)     Temp Source Oral     SpO2 98 %     Weight 120 lb (54.4 kg)     Height 5\' 1"  (1.549 m)     Head Circumference      Peak Flow      Pain Score 8   Constitutional: Alert and oriented. Well appearing and in no distress. Eyes: Conjunctivae are normal. Normal extraocular movements. ENT   Head: Normocephalic and atraumatic.   Nose: No congestion/rhinnorhea.   Mouth/Throat: Mucous membranes are moist.   Neck: No stridor. Hematological/Lymphatic/Immunilogical: No cervical lymphadenopathy. Cardiovascular: Normal rate, regular rhythm.  No murmurs, rubs, or gallops. Respiratory: Normal respiratory effort without tachypnea nor retractions. Breath sounds are clear  and equal bilaterally. No wheezes/rales/rhonchi. Gastrointestinal: Soft. Tender to palpation in the lower abdomen. No distention. Genitourinary: Deferred Musculoskeletal: Normal range of motion in all extremities. No lower extremity edema. Neurologic:  Normal speech and language. No gross focal neurologic deficits are appreciated.  Skin:  Skin is warm, dry and intact. No rash noted. Psychiatric: Mood and affect are normal. Speech and behavior are normal. Patient exhibits appropriate insight and judgment.  ____________________________________________    LABS (pertinent positives/negatives)  Labs Reviewed  COMPREHENSIVE METABOLIC PANEL - Abnormal; Notable for the following:       Result Value   Glucose, Bld 106 (*)    BUN 25 (*)    GFR calc non Af Amer 60 (*)    All other components within normal limits  CBC - Abnormal; Notable for the following:    RBC 3.66 (*)    Hemoglobin 10.7 (*)    HCT 31.9 (*)    RDW 21.5 (*)    All other components within normal limits  URINALYSIS COMPLETEWITH MICROSCOPIC ([facility] ONLY) - Abnormal; Notable for the following:    Color, Urine YELLOW (*)    APPearance CLOUDY (*)    Leukocytes, UA 3+ (*)    Bacteria, UA MANY (*)    Squamous Epithelial / LPF 0-5 (*)    All other components within normal limits  URINE CULTURE  LIPASE, BLOOD     ____________________________________________   EKG  None  ____________________________________________    RADIOLOGY  CT abd/pel IMPRESSION:  1. Apparent large heterogeneous mass at the pancreatic head,  compressing the second segment of the duodenum, measuring perhaps  4.9 x 3.2 x 3.2 cm. 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 MRCP is recommended for further evaluation.  2. Scattered heterogeneous hypodense masses throughout the liver,  measuring up to 3.2 cm in size, compatible with metastatic disease.  3. Diffuse irregular wall thickening along the bladder, raising  concern for  cystitis. Underlying mass cannot be entirely excluded.  Cystoscopy would be helpful for further evaluation, when and as  deemed clinically appropriate.  4. Mild left renal scarring noted. Small bilateral renal cysts seen.  5. Scattered aortic atherosclerosis.  6. Multilevel compression deformities along the lower thoracic and  lumbar spine, from T10-L4.  7. Scattered coronary artery calcifications seen.  8. Atelectasis or scarring at the lung bases.   ____________________________________________   PROCEDURES  Procedures  ____________________________________________   INITIAL IMPRESSION / ASSESSMENT AND PLAN / ED COURSE  Pertinent labs & imaging results that were available during my care of the patient were reviewed by me and considered in my medical decision making (see chart for details).  Patient presented to the emergency department today because of concerns for abdominal pain. Patient was recently treated for UTI. Pain is located in the lower abdomen left lower quadrant. Would have concern for recurrent UTI, diverticulitis, small bowel obstruction. Will obtain blood work, urine HCT.  Clinical Course    CT scan shows some findings consistent with cystitis and UA is concerning for UTI. I think this is likely the cause of patient's pain. We will give IV antibiotics here and discharged with prescription for antibiotic. Somewhat more concerning however the findings of the pancreatic mass and liver metastatic disease. I did have a frank discussion with the patient that this likely represents cancer. In addition I talked to Dr. Grayland Ormond with oncology. He will try to have the patient be seen in clinic either tomorrow or the day after. I discussed this with the patient. ____________________________________________   FINAL CLINICAL IMPRESSION(S) / ED DIAGNOSES  Final diagnoses:  Pancreatic mass  Urinary tract infection without hematuria, site unspecified  Abdominal pain, unspecified  abdominal location     Note: This dictation was prepared with Dragon dictation. Any transcriptional errors that result from this process are unintentional    Nance Pear, MD 08/17/16 2310

## 2016-08-17 NOTE — Discharge Instructions (Signed)
As we discussed your CT scan showed a pancreatic mass, concerning for cancer. In addition there were findings concerning for metastatic disease in your liver. Expect a call from Dr. Gary Fleet office tomorrow. Please seek medical attention for any high fevers, chest pain, shortness of breath, change in behavior, persistent vomiting, bloody stool or any other new or concerning symptoms.

## 2016-08-17 NOTE — ED Notes (Signed)
Pt reports having left lower abd pain for the last 3-4 days with decreased appetite - pt reports that when she eats she becomes nauseated and vomits (once a day for the last 4 days) - pt states history of bowel blockage - states she has not had a BM in the last 2-3 days - pt has a CT of her left lung scheduled for tomorrow (unclear reasoning)

## 2016-08-17 NOTE — ED Notes (Signed)
Pt transported to CT via stretcher.  

## 2016-08-17 NOTE — ED Notes (Signed)
Pt received bad news about medical condition - pt and family tearful - offered fluids/food - offered pastor (they requested he come and say a pray for the pt) - Network engineer notified and pastor paged

## 2016-08-17 NOTE — Telephone Encounter (Signed)
Patient cam e into clinic with C/O left sided flank pain duration of x 4 days with nausea times X 3 patient stated she has vomited X1 and has no appetite and does not want to t drink she feels full . Denies any problems urinating and denies any Dysuria , patient stated she cannot remember last BM , but admitted last BM relieved pain . Vitals as follows

## 2016-08-17 NOTE — ED Provider Notes (Signed)
CSN: [id number]     Arrival date & time 08/16/16  1200 History   First MD Initiated Contact with Patient 08/16/16 1357     Chief Complaint  Patient presents with  . Abdominal Pain    LLQ   (Consider location/radiation/quality/duration/timing/severity/associated sxs/prior Treatment) 70 year old female accompanied by a friend with concern over continued left mid to lower abdominal pain for many weeks that has gotten worse over the past 2 days. She denies any fever, diarrhea or constipation but has some nausea and dry-heaved due to pain. She has been unable to eat due to pain but is taking in limited liquids. She was seen here on 11/2 with multiple issues as well as abdominal pain- uncertain of etiology- told to follow-up with her PCP. She did go to her PCP and is scheduled for a CT scan of her abdomen tomorrow at [facility]. She mainly was interested in getting fluid replacement here at Urgent Care.    The history is provided by the patient.  Abdominal Pain  Associated symptoms: fatigue, nausea and vomiting   Associated symptoms: no chest pain, no chills, no constipation, no diarrhea, no dysuria, no fever, no shortness of breath, no sore throat and no vaginal discharge     Past Medical History:  Diagnosis Date  . Anemia   . Arthritis   . Basal cell carcinoma of face   . BULIMIA many decades of this   patient keeps alive by drinking ensure that she cannot purge  . GERD (gastroesophageal reflux disease)   . Hypercholesteremia   . KIDNEY DISEASE, CHRONIC, STAGE III 2010   Cr 1.3 -> .84, probably from NSAID's and lasix  . LEAKAGE, CONTINUOUS URINE 07/30/2009  . LOW BACK PAIN, CHRONIC    takes tylenol  . Macular degeneration disease    Dingledein, now [place]  . Major depressive disorder, recurrent episode, severe ([place])    well treated with SSRI's  . OSTEOPOROSIS    severe, treated with infusions, followed by endocrine in [place]  . Personal history of tobacco use,  presenting hazards to health 08/11/2016  . Wears dentures    full upper and lower   Past Surgical History:  Procedure Laterality Date  . CARPAL TUNNEL RELEASE    . CATARACT EXTRACTION    . COLON SURGERY  1980   partial colectomy  . COLONOSCOPY WITH PROPOFOL N/A 06/10/2016   Procedure: COLONOSCOPY WITH PROPOFOL;  Surgeon: Manya Silvas, MD;  Location: [facility] ENDOSCOPY;  Service: Endoscopy;  Laterality: N/A;  . ECTROPION REPAIR Bilateral 08/13/2015   Procedure: REPAIR OF ECTROPION;  Surgeon: Karle Starch, MD;  Location: [place];  Service: Ophthalmology;  Laterality: Bilateral;  . ESOPHAGOGASTRODUODENOSCOPY (EGD) WITH PROPOFOL N/A 06/10/2016   Procedure: ESOPHAGOGASTRODUODENOSCOPY (EGD) WITH PROPOFOL;  Surgeon: Manya Silvas, MD;  Location: [facility] ENDOSCOPY;  Service: Endoscopy;  Laterality: N/A;  . 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 PTOSIS REPAIR Bilateral 08/13/2015   Procedure: PTOSIS REPAIR;  Surgeon: Karle Starch, MD;  Location: [place];  Service: Ophthalmology;  Laterality: Bilateral;  . SURGICAL EXCISION OF EXCESSIVE SKIN  1979  . TUBAL LIGATION     Family History  Problem Relation Age of Onset  . Cancer Mother     breast,   . Breast cancer Mother   . Cancer Father     lung  . Cancer Sister     lung, brain  . Cancer Brother  throat   Social History  Substance Use Topics  . Smoking status: Former Smoker    Packs/day: 1.50    Years: 40.00    Quit date: 10/02/2010  . Smokeless tobacco: Never Used  . 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 Negative for chills and fever.  HENT: Negative for congestion and sore throat.   Respiratory: Negative for shortness of breath.   Cardiovascular: Negative for chest pain.  Gastrointestinal: Positive for abdominal pain, nausea and  vomiting. Negative for blood in stool, constipation and diarrhea.  Genitourinary: Negative for difficulty urinating, dysuria, pelvic pain and vaginal discharge.  Skin: Negative for rash and wound.  Neurological: Negative for syncope.  Hematological: Negative for adenopathy.    Allergies  Patient has no known allergies.  Home Medications   Prior to Admission medications   Medication Sig Start Date End Date Taking? Authorizing Provider  aspirin 81 MG tablet Take 1 tablet (81 mg total) by mouth daily. 12/05/13   Crecencio Mc, MD  calcium citrate-vitamin D 200-200 MG-UNIT TABS Take 3 tablets by mouth daily.     Historical Provider, MD  clonazePAM (KLONOPIN) 0.5 MG tablet 0.25 mg 2 (two) times daily as needed.  11/18/14   Historical Provider, MD  Cyanocobalamin (RA VITAMIN B-12 TR) 1000 MCG TBCR Take 1 tablet by mouth daily.     Historical Provider, MD  docusate sodium (COLACE) 100 MG capsule Take 100 mg by mouth 2 (two) times daily.    Historical Provider, MD  donepezil (ARICEPT) 10 MG tablet Take 10 mg by mouth. 05/12/16   Historical Provider, MD  fish oil-omega-3 fatty acids 1000 MG capsule Take 1 capsule by mouth daily.    Historical Provider, MD  FLUoxetine HCl 60 MG TABS TAKE ONE TABLET BY MOUTH ONCE DAILY 05/25/16   Crecencio Mc, MD  gabapentin (NEURONTIN) 100 MG capsule Take 100 mg by mouth 4 (four) times daily.    Historical Provider, MD  HYDROcodone-acetaminophen (NORCO) 10-325 MG tablet Take 1 tablet by mouth 2 (two) times daily. As needed for severe pain 01/07/16   Crecencio Mc, MD  mirtazapine (REMERON) 15 MG tablet Take 1 tablet (15 mg total) by mouth at bedtime. 11/25/15   Crecencio Mc, MD  Multiple Vitamins-Minerals (ICAPS PO) Take by mouth.    Historical Provider, MD  omeprazole (PRILOSEC) 40 MG capsule TAKE ONE CAPSULE BY MOUTH ONCE DAILY 09/11/15   Crecencio Mc, MD  oxybutynin (DITROPAN XL) 15 MG 24 hr tablet Take 1 tablet (15 mg total) by mouth at bedtime. 04/07/16    Crecencio Mc, MD  pravastatin (PRAVACHOL) 20 MG tablet TAKE ONE TABLET BY MOUTH ONCE DAILY 11/19/15   Crecencio Mc, MD  predniSONE (DELTASONE) 10 MG tablet Take 2 tablets (20 mg total) by mouth daily. 07/30/16   Lorin Picket, PA-C  SYNTHROID 25 MCG tablet Take 25 mcg by mouth daily before breakfast.  03/30/16   Historical Provider, MD  traZODone (DESYREL) 50 MG tablet TAKE ONE TO TWO TABLETS BY MOUTH ONCE DAILY 06/20/15   Crecencio Mc, MD  Vitamin D, Ergocalciferol, (DRISDOL) 50000 UNITS CAPS Take 50,000 Units by mouth every 14 (fourteen) days.    Historical Provider, MD  vitamin E 400 UNIT capsule Take 400 Units by mouth daily.  Reported on 04/07/2016    Historical Provider, MD  zoledronic acid (RECLAST) 5 MG/100ML SOLN Every year.    Historical Provider, MD   Meds Ordered and Administered this Visit  Medications - No data to display  BP (!) 96/57 (BP Location: Left Arm)   Pulse 72   Temp 97.6 F (36.4 C) (Tympanic)   Resp 16   Ht 5\' 1"  (1.549 m)   Wt 120 lb (54.4 kg)   SpO2 100%   BMI 22.67 kg/m  No data found.   Physical Exam  Constitutional: She is oriented to person, place, and time. She appears well-developed. She appears cachectic. No distress.  HENT:  Head: Normocephalic and atraumatic.  Nose: Nose normal.  Mouth/Throat: Oropharynx is clear and moist.  Cardiovascular: Normal rate and regular rhythm.   Pulmonary/Chest: Effort normal and breath sounds normal.  Abdominal: Soft. Normal appearance and bowel sounds are normal. She exhibits no distension and no mass. There is tenderness in the left upper quadrant and left lower quadrant. There is no rigidity, no rebound, no guarding and no CVA tenderness.  Neurological: She is alert and oriented to person, place, and time.  Skin: Skin is warm.  Psychiatric: Her speech is normal. Thought content normal. She is slowed. She exhibits a depressed mood.    Urgent Care Course   Clinical Course     Procedures (including  critical care time)  Labs Review Labs Reviewed - No data to display  Imaging Review No results found.   Visual Acuity Review  Right Eye Distance:   Left Eye Distance:   Bilateral Distance:    Right Eye Near:   Left Eye Near:    Bilateral Near:         MDM   1. Recurrent left lower quadrant abdominal pain    Discussed with patient and her friend that there are many causes of left sided abdominal pain. Discussed that we do not perform fluid replacement here and that she needs to go to the ER for further evaluation. Encouraged to drink Gatorade or Powerade to push fluid intake. Reviewed that CT scan will show more information in comparison to an ultrasound and she already has a CT scheduled for tomorrow. Recommend go to the ER today- patient hesitant to go. Strongly encouraged she go today since symptomatic and pain continues to get worse.     Katy Apo, NP 08/17/16 0110

## 2016-08-18 ENCOUNTER — Ambulatory Visit: Payer: PPO

## 2016-08-18 ENCOUNTER — Ambulatory Visit: Admission: RE | Admit: 2016-08-18 | Payer: PPO | Source: Ambulatory Visit

## 2016-08-18 ENCOUNTER — Inpatient Hospital Stay (HOSPITAL_BASED_OUTPATIENT_CLINIC_OR_DEPARTMENT_OTHER): Payer: PPO | Admitting: Internal Medicine

## 2016-08-18 DIAGNOSIS — M1990 Unspecified osteoarthritis, unspecified site: Secondary | ICD-10-CM

## 2016-08-18 DIAGNOSIS — K912 Postsurgical malabsorption, not elsewhere classified: Secondary | ICD-10-CM

## 2016-08-18 DIAGNOSIS — D500 Iron deficiency anemia secondary to blood loss (chronic): Secondary | ICD-10-CM

## 2016-08-18 DIAGNOSIS — F1021 Alcohol dependence, in remission: Secondary | ICD-10-CM

## 2016-08-18 DIAGNOSIS — G8929 Other chronic pain: Secondary | ICD-10-CM

## 2016-08-18 DIAGNOSIS — Z7982 Long term (current) use of aspirin: Secondary | ICD-10-CM

## 2016-08-18 DIAGNOSIS — Z9884 Bariatric surgery status: Secondary | ICD-10-CM

## 2016-08-18 DIAGNOSIS — N281 Cyst of kidney, acquired: Secondary | ICD-10-CM

## 2016-08-18 DIAGNOSIS — K869 Disease of pancreas, unspecified: Secondary | ICD-10-CM

## 2016-08-18 DIAGNOSIS — Z79899 Other long term (current) drug therapy: Secondary | ICD-10-CM

## 2016-08-18 DIAGNOSIS — I700 Atherosclerosis of aorta: Secondary | ICD-10-CM

## 2016-08-18 DIAGNOSIS — K219 Gastro-esophageal reflux disease without esophagitis: Secondary | ICD-10-CM

## 2016-08-18 DIAGNOSIS — Z79891 Long term (current) use of opiate analgesic: Secondary | ICD-10-CM

## 2016-08-18 DIAGNOSIS — K769 Liver disease, unspecified: Secondary | ICD-10-CM | POA: Diagnosis not present

## 2016-08-18 DIAGNOSIS — R502 Drug induced fever: Secondary | ICD-10-CM

## 2016-08-18 DIAGNOSIS — Z85828 Personal history of other malignant neoplasm of skin: Secondary | ICD-10-CM

## 2016-08-18 DIAGNOSIS — R160 Hepatomegaly, not elsewhere classified: Secondary | ICD-10-CM | POA: Diagnosis not present

## 2016-08-18 DIAGNOSIS — Z9049 Acquired absence of other specified parts of digestive tract: Secondary | ICD-10-CM

## 2016-08-18 DIAGNOSIS — K8689 Other specified diseases of pancreas: Secondary | ICD-10-CM

## 2016-08-18 DIAGNOSIS — R630 Anorexia: Secondary | ICD-10-CM

## 2016-08-18 DIAGNOSIS — E7800 Pure hypercholesterolemia, unspecified: Secondary | ICD-10-CM

## 2016-08-18 DIAGNOSIS — R1032 Left lower quadrant pain: Secondary | ICD-10-CM

## 2016-08-18 DIAGNOSIS — M549 Dorsalgia, unspecified: Secondary | ICD-10-CM

## 2016-08-18 DIAGNOSIS — N183 Chronic kidney disease, stage 3 (moderate): Secondary | ICD-10-CM

## 2016-08-18 DIAGNOSIS — H3530 Unspecified macular degeneration: Secondary | ICD-10-CM

## 2016-08-18 DIAGNOSIS — Z952 Presence of prosthetic heart valve: Secondary | ICD-10-CM

## 2016-08-18 DIAGNOSIS — R531 Weakness: Secondary | ICD-10-CM

## 2016-08-18 LAB — PROTIME-INR
INR: 1.03
PROTHROMBIN TIME: 13.6 s (ref 11.4–15.2)

## 2016-08-18 NOTE — Progress Notes (Signed)
Patient is here for new pancreatic mass.

## 2016-08-18 NOTE — Progress Notes (Signed)
[facility] NOTE  Patient Care Team: Crecencio Mc, MD as PCP - General (Internal Medicine)  CHIEF COMPLAINTS/PURPOSE OF CONSULTATION:   # NOV 2017- PANCREATIC HEAD MASS [~4cm]; multiple liver lesions  #  IRON DEFICIENCY ANEMIA [SEP 2017-Dr.Elliot-EGD/Colo-Negative; Stool occult Positive]  # "Gastric Bypass"   No history exists.     HISTORY OF PRESENTING ILLNESS:  Meagan Zavala 70 y.o.  female were previously seen me because of iron deficiency anemia; has been referred to [place] because of new diagnosis of pancreatic mass.  Patient stated that she had been having poor appetite week abdominal discomfort over the last many months. And the pain subsequently got worse over the last few days- which led to an emergency room visit that included a CT scan- that showed pancreatic head mass and multiple liver lesions as described above. She has been deferred was for further workup and recommendations.     Patient takes occasional hydrocodone for pain control. She lost some weight. Poor appetite; complains of generalized weakness. No unusual swelling legs or arms. No unusual chest pain or shortness of breath or cough  ROS: A complete 10 point review of system is done which is negative except mentioned above in history of present illness  MEDICAL HISTORY:  Past Medical History:  Diagnosis Date  . Anemia   . Arthritis   . Basal cell carcinoma of face   . BULIMIA many decades of this   patient keeps alive by drinking ensure that she cannot purge  . GERD (gastroesophageal reflux disease)   . Hypercholesteremia   . KIDNEY DISEASE, CHRONIC, STAGE III 2010   Cr 1.3 -> .84, probably from NSAID's and lasix  . LEAKAGE, CONTINUOUS URINE 07/30/2009  . LOW BACK PAIN, CHRONIC    takes tylenol  . Macular degeneration disease    Dingledein, now [place]eller  . Major depressive disorder, recurrent episode, severe ([place])    well treated with SSRI's  . OSTEOPOROSIS    severe,  treated with infusions, followed by endocrine in [place]  . Personal history of tobacco use, presenting hazards to health 08/11/2016  . Wears dentures    full upper and lower    SURGICAL HISTORY: Past Surgical History:  Procedure Laterality Date  . CARPAL TUNNEL RELEASE    . CATARACT EXTRACTION    . COLON SURGERY  1980   partial colectomy  . COLONOSCOPY WITH PROPOFOL N/A 06/10/2016   Procedure: COLONOSCOPY WITH PROPOFOL;  Surgeon: Manya Silvas, MD;  Location: [facility] ENDOSCOPY;  Service: Endoscopy;  Laterality: N/A;  . ECTROPION REPAIR Bilateral 08/13/2015   Procedure: REPAIR OF ECTROPION;  Surgeon: Karle Starch, MD;  Location: [place];  Service: Ophthalmology;  Laterality: Bilateral;  . ESOPHAGOGASTRODUODENOSCOPY (EGD) WITH PROPOFOL N/A 06/10/2016   Procedure: ESOPHAGOGASTRODUODENOSCOPY (EGD) WITH PROPOFOL;  Surgeon: Manya Silvas, MD;  Location: [facility] ENDOSCOPY;  Service: Endoscopy;  Laterality: N/A;  . 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 PTOSIS REPAIR Bilateral 08/13/2015   Procedure: PTOSIS REPAIR;  Surgeon: Karle Starch, MD;  Location: [place];  Service: Ophthalmology;  Laterality: Bilateral;  . SURGICAL EXCISION OF EXCESSIVE SKIN  1979  . TUBAL LIGATION      SOCIAL HISTORY: Social History   Social History  . Marital status: Single    Spouse name: N/A  . Number of children: N/A  . Years of education: N/A   Occupational History  . Not on file.  Social History Main Topics  . Smoking status: Former Smoker    Packs/day: 1.50    Years: 40.00    Quit date: 10/02/2010  . Smokeless tobacco: Never Used  . 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 Drug use: No  . Sexual activity: Not on file   Other Topics Concern  . Not on file   Social History Narrative   Patient has been living alone. She is on disability. She goes to church and has support there. She has a very supportive cousin,  Tatanisha Pullar: [phone].    FAMILY HISTORY: Family History  Problem Relation Age of Onset  . Cancer Mother     breast,   . Breast cancer Mother   . Cancer Father     lung  . Cancer Sister     lung, brain  . Cancer Brother     throat    ALLERGIES:  has No Known Allergies.  MEDICATIONS:  Current Outpatient Prescriptions  Medication Sig Dispense Refill  . aspirin 81 MG tablet Take 1 tablet (81 mg total) by mouth daily. 30 tablet 11  . BIOTIN PO Take by mouth.    . calcium citrate-vitamin D 200-200 MG-UNIT TABS Take 3 tablets by mouth daily.     . cephALEXin (KEFLEX) 500 MG capsule Take 1 capsule (500 mg total) by mouth 3 (three) times daily. 30 capsule 0  . clonazePAM (KLONOPIN) 0.5 MG tablet 0.25 mg 2 (two) times daily as needed.   0  . Cyanocobalamin (RA VITAMIN B-12 TR) 1000 MCG TBCR Take 1 tablet by mouth daily.     Marland Kitchen docusate sodium (COLACE) 100 MG capsule Take 100 mg by mouth 2 (two) times daily.    Marland Kitchen donepezil (ARICEPT) 10 MG tablet Take 10 mg by mouth.    . fish oil-omega-3 fatty acids 1000 MG capsule Take 1 capsule by mouth daily.    Marland Kitchen FLUoxetine HCl 60 MG TABS TAKE ONE TABLET BY MOUTH ONCE DAILY 90 tablet 1  . gabapentin (NEURONTIN) 100 MG capsule Take 100 mg by mouth 4 (four) times daily.    Marland Kitchen HYDROcodone-acetaminophen (NORCO) 10-325 MG tablet Take 1 tablet by mouth 2 (two) times daily. As needed for severe pain 60 tablet 0  . meloxicam (MOBIC) 7.5 MG tablet     . Multiple Vitamins-Minerals (ICAPS PO) Take by mouth.    Marland Kitchen omeprazole (PRILOSEC) 40 MG capsule TAKE ONE CAPSULE BY MOUTH ONCE DAILY 90 capsule 2  . oxybutynin (DITROPAN XL) 15 MG 24 hr tablet Take 1 tablet (15 mg total) by mouth at bedtime. 90 tablet 2  . pravastatin (PRAVACHOL) 20 MG tablet TAKE ONE TABLET BY MOUTH ONCE DAILY 90 tablet 3  . SYNTHROID 25 MCG tablet Take 25 mcg by mouth daily before breakfast.     . traZODone (DESYREL) 50 MG tablet TAKE ONE TO TWO TABLETS BY MOUTH ONCE DAILY 180 tablet 0   . Vitamin D, Ergocalciferol, (DRISDOL) 50000 UNITS CAPS Take 50,000 Units by mouth every 14 (fourteen) days.    . vitamin E 400 UNIT capsule Take 400 Units by mouth daily. Reported on 04/07/2016    . zoledronic acid (RECLAST) 5 MG/100ML SOLN Every year.    . ciprofloxacin (CIPRO) 250 MG tablet     . mirtazapine (REMERON) 15 MG tablet Take 1 tablet (15 mg total) by mouth at bedtime. (Patient not taking: Reported on 08/18/2016) 90 tablet 1  . predniSONE (  DELTASONE) 10 MG tablet Take 2 tablets (20 mg total) by mouth daily. (Patient not taking: Reported on 08/18/2016) 6 tablet 0  . traMADol (ULTRAM) 50 MG tablet Take 1 tablet (50 mg total) by mouth every 6 (six) hours as needed for severe pain. (Patient not taking: Reported on 08/18/2016) 15 tablet 0   No current facility-administered medications for this visit.       Marland Kitchen  PHYSICAL EXAMINATION: ECOG PERFORMANCE STATUS: 1 - Symptomatic but completely ambulatory  Vitals:   08/18/16 1459  BP: 101/68  Pulse: 70  Resp: 18  Temp: 97.3 F (36.3 C)   Filed Weights   08/18/16 1459  Weight: 120 lb (54.4 kg)    GENERAL: Thin built moderately nourished Caucasian female patient Alert, no distress and comfortable. Accompanied by a friend. EYES: no pallor or icterus OROPHARYNX: no thrush or ulceration; good dentition  NECK: supple, no masses felt LYMPH:  no palpable lymphadenopathy in the cervical, axillary or inguinal regions LUNGS: clear to auscultation and  No wheeze or crackles HEART/CVS: regular rate & rhythm and no murmurs; No lower extremity edema ABDOMEN: abdomen soft, non-tender and normal bowel sounds Musculoskeletal:no cyanosis of digits and no clubbing  PSYCH: alert & oriented x 3 with fluent speech NEURO: no focal motor/sensory deficits SKIN:  no rashes or significant lesions  LABORATORY DATA:  I have reviewed the data as listed Lab Results  Component Value Date   WBC 4.8 08/17/2016   HGB 10.7 (L) 08/17/2016   HCT 31.9 (L)  08/17/2016   MCV 87.2 08/17/2016   PLT 215 08/17/2016    Recent Labs  10/01/15 1338  07/17/16 1556 07/30/16 1524 08/17/16 1412  NA 140  < > 135 136 137  K 4.0  < > 3.5 3.9 3.5  CL 102  < > 100* 104 102  CO2 31  < > 25 25 27   GLUCOSE 88  < > 125* 102* 106*  BUN 20  < > 24* 26* 25*  CREATININE 0.86  < > 1.06* 0.90 0.93  CALCIUM 9.8  < > 10.5* 9.3 9.7  GFRNONAA  --   < > 51* >60 60*  GFRAA  --   < > 59* >60 >60  PROT 6.7  --   --  7.9 7.6  ALBUMIN 4.0  --   --  4.1 3.7  AST 19  --   --  39 36  ALT 18  --   --  29 25  ALKPHOS 75  --   --  86 91  BILITOT 0.4  --   --  0.4 0.4  < > = values in this interval not displayed.  RADIOGRAPHIC STUDIES: I have personally reviewed the radiological images as listed and agreed with the findings in the report. Dg Chest 2 View  Result Date: 07/30/2016 CLINICAL DATA:  Left flank pain EXAM: CHEST  2 VIEW COMPARISON:  08/20/2015 FINDINGS: Cardiomediastinal silhouette is stable. No infiltrate or pulmonary edema. Multiple compression deformities thoracolumbar spine are noted of indeterminate age. Clinical correlation is necessary. There is vague nodular density in right upper lobe measures about 9 mm. Further correlation with CT scan of the chest is recommended to exclude a lung nodule. Surgical clips are noted in left upper abdomen. IMPRESSION: No infiltrate or pulmonary edema. Multiple compression deformities thoracolumbar spine are noted of indeterminate age. Clinical correlation is necessary. There is vague nodular density in right upper lobe measures about 9 mm. Further correlation with CT scan of the chest  is recommended to exclude a lung nodule. Electronically Signed   By: Lahoma Crocker M.D.   On: 07/30/2016 14:48   Ct Abdomen Pelvis W Contrast  Result Date: 08/17/2016 CLINICAL DATA:  Acute onset of left lower quadrant abdominal pain and vomiting. Initial encounter. EXAM: CT ABDOMEN AND PELVIS WITH CONTRAST TECHNIQUE: Multidetector CT imaging of the  abdomen and pelvis was performed using the standard protocol following bolus administration of intravenous contrast. CONTRAST:  37mL ISOVUE-300 IOPAMIDOL (ISOVUE-300) INJECTION 61% COMPARISON:  None. FINDINGS: Lower chest: Mild coronary artery calcifications are seen. Bibasilar atelectasis or scarring is noted. Hepatobiliary: Scattered heterogeneous hypodense masses are noted throughout the liver, measuring up to 3.2 cm in size. These are compatible with metastatic disease. The patient is status post cholecystectomy. The common bile duct remains normal in caliber. Pancreas: There appears to be a large heterogeneous mass at the pancreatic head, compressing the second segment of the duodenum, measuring perhaps 4.9 x 3.2 x 3.2 cm. This is concerning for primary pancreatic malignancy. MRCP is recommended for further evaluation. Spleen: The spleen is unremarkable in appearance. Adrenals/Urinary Tract: The adrenal glands are unremarkable in appearance. Mild left renal scarring is noted. Small bilateral renal cysts are noted. There is no evidence of hydronephrosis. No renal or ureteral stones are identified. Stomach/Bowel: The stomach is unremarkable in appearance. The small bowel is within normal limits. The appendix is normal in caliber, without evidence of appendicitis. The colon is unremarkable in appearance. Vascular/Lymphatic: Scattered calcification is seen along the abdominal aorta and its branches. The abdominal aorta is otherwise grossly unremarkable. The inferior vena cava is grossly unremarkable. No retroperitoneal lymphadenopathy is seen. No pelvic sidewall lymphadenopathy is identified. Reproductive: Diffuse irregular wall thickening is noted along the bladder, raising concern for cystitis. Underlying mass cannot be entirely excluded. The remaining uterus is grossly unremarkable. The ovaries are grossly symmetric. No suspicious adnexal masses are seen. Other: No additional soft tissue abnormalities are seen.  Musculoskeletal: No acute osseous abnormalities are identified. Multilevel chronic compression deformities are noted along the lower thoracic and lumbar spine, from T10-L4. The visualized musculature is unremarkable in appearance. IMPRESSION: 1. Apparent large heterogeneous mass at the pancreatic head, compressing the second segment of the duodenum, measuring perhaps 4.9 x 3.2 x 3.2 cm. This is concerning for primary pancreatic malignancy. MRCP is recommended for further evaluation. 2. Scattered heterogeneous hypodense masses throughout the liver, measuring up to 3.2 cm in size, compatible with metastatic disease. 3. Diffuse irregular wall thickening along the bladder, raising concern for cystitis. Underlying mass cannot be entirely excluded. Cystoscopy would be helpful for further evaluation, when and as deemed clinically appropriate. 4. Mild left renal scarring noted. Small bilateral renal cysts seen. 5. Scattered aortic atherosclerosis. 6. Multilevel compression deformities along the lower thoracic and lumbar spine, from T10-L4. 7. Scattered coronary artery calcifications seen. 8. Atelectasis or scarring at the lung bases. Electronically Signed   By: Garald Balding M.D.   On: 08/17/2016 19:56    ASSESSMENT & PLAN:   Pancreatic mass Approximately 4 cm pancreatic head mass; with multiple lesions in the liver highly suspicious for pancreatic malignancy. Check CA-19-9. Recommend biopsy of the liver lesions if amenable. Tried to reach interventional radiology.Check PET scan. Check PT PTT.  # History of iron deficiency anemia/malabsorption secondary to gastric bypass-status post IV iron however most recent iron studies suggestive of chronic disease/malignancy.  # Recommend follow-up few days after the results of the biopsy.  # I reviewed the blood work- with the patient in  detail; also reviewed the imaging independently [as summarized above]; and with the patient in detail.   All questions were answered.  The patient knows to call the clinic with any problems, questions or concerns.   Cammie Sickle, MD 08/19/2016 5:34 PM

## 2016-08-18 NOTE — Assessment & Plan Note (Signed)
Approximately 4 cm pancreatic head mass; with multiple lesions in the liver highly suspicious for pancreatic malignancy. Check CA-19-9. Recommend biopsy of the liver lesions if amenable. Tried to reach interventional radiology.Check PET scan. Check PT PTT.  # History of iron deficiency anemia/malabsorption secondary to gastric bypass-status post IV iron however most recent iron studies suggestive of chronic disease/malignancy.  # Recommend follow-up few days after the results of the biopsy.  # I reviewed the blood work- with the patient in detail; also reviewed the imaging independently [as summarized above]; and with the patient in detail.

## 2016-08-19 LAB — CANCER ANTIGEN 19-9: CA 19 9: 180 U/mL — AB (ref 0–35)

## 2016-08-19 LAB — APTT: APTT: 37 s — AB (ref 24–36)

## 2016-08-20 LAB — URINE CULTURE: Culture: >=100000 — AB

## 2016-08-24 ENCOUNTER — Telehealth: Payer: Self-pay | Admitting: Internal Medicine

## 2016-08-24 ENCOUNTER — Other Ambulatory Visit: Payer: Self-pay | Admitting: Internal Medicine

## 2016-08-24 NOTE — Telephone Encounter (Signed)
bx form was faxed last Tuesday. confirmation rcvd at that time.  Almyra Free, CMA contacted sch. Team to clarify when the bx will be scheduled.

## 2016-08-24 NOTE — Telephone Encounter (Signed)
She wants to know if we have a date yet for her bx. Please advise. Thanks.

## 2016-08-24 NOTE — Telephone Encounter (Signed)
Called [facility] specialty scheduling at [phone], left vm to return our call as to why patient has not been scheduled for biopsy, order was sent to them on 08/18/16.

## 2016-08-25 ENCOUNTER — Telehealth: Payer: Self-pay | Admitting: Internal Medicine

## 2016-08-25 ENCOUNTER — Other Ambulatory Visit: Payer: Self-pay | Admitting: Internal Medicine

## 2016-08-25 ENCOUNTER — Ambulatory Visit
Admission: RE | Admit: 2016-08-25 | Discharge: 2016-08-25 | Disposition: A | Discharge status: Home / Self Care | Payer: PPO | Source: Ambulatory Visit | Attending: Internal Medicine | Admitting: Internal Medicine

## 2016-08-25 ENCOUNTER — Telehealth: Payer: Self-pay | Admitting: *Deleted

## 2016-08-25 DIAGNOSIS — C787 Secondary malignant neoplasm of liver and intrahepatic bile duct: Secondary | ICD-10-CM | POA: Insufficient documentation

## 2016-08-25 DIAGNOSIS — C772 Secondary and unspecified malignant neoplasm of intra-abdominal lymph nodes: Secondary | ICD-10-CM | POA: Insufficient documentation

## 2016-08-25 DIAGNOSIS — C801 Malignant (primary) neoplasm, unspecified: Secondary | ICD-10-CM | POA: Insufficient documentation

## 2016-08-25 DIAGNOSIS — K8689 Other specified diseases of pancreas: Secondary | ICD-10-CM

## 2016-08-25 DIAGNOSIS — K869 Disease of pancreas, unspecified: Secondary | ICD-10-CM | POA: Insufficient documentation

## 2016-08-25 LAB — GLUCOSE, CAPILLARY: GLUCOSE-CAPILLARY: 81 mg/dL (ref 65–99)

## 2016-08-25 MED ORDER — PROMETHAZINE HCL 12.5 MG PO TABS: 12.5000 mg | ORAL_TABLET | Freq: Three times a day (TID) | ORAL | 0 refills | Status: DC | PRN

## 2016-08-25 MED ORDER — ONDANSETRON 4 MG PO TBDP: 4.0000 mg | ORAL_TABLET | Freq: Three times a day (TID) | ORAL | 5 refills | Status: DC | PRN

## 2016-08-25 MED ORDER — FLUDEOXYGLUCOSE F - 18 (FDG) INJECTION
13.0000 | Freq: Once | INTRAVENOUS | Status: AC | PRN
  Administered 2016-08-25: 12.28 via INTRAVENOUS

## 2016-08-25 MED ORDER — HYDROCODONE-ACETAMINOPHEN 10-325 MG PO TABS: 1.0000 | ORAL_TABLET | Freq: Four times a day (QID) | ORAL | 0 refills | Status: DC | PRN

## 2016-08-25 NOTE — Telephone Encounter (Signed)
Given to patient in office by Fransisco Beau.

## 2016-08-25 NOTE — Telephone Encounter (Signed)
Meagan Zavala in [place], has requested to have pt's Rx for  Zofran switched to promethazine

## 2016-08-25 NOTE — Telephone Encounter (Signed)
ON 11/19 patient came into clinic with left sided flank pain and was advised by nurse, and MD she needs to go to ED patient went to ED and was DX with Pancreatic mass patient awaiting biopsy, Dr. Rogue Bussing, Patient came into clinic today with same symptoms and ask for something for nausea, patient is being seen by NP to [place] at 10.30 but would like something for nausea til appointment.

## 2016-08-25 NOTE — Telephone Encounter (Signed)
ZOFRAN SENT.   VICODIN REFILLED

## 2016-08-26 ENCOUNTER — Encounter: Payer: Self-pay | Admitting: Family

## 2016-08-26 ENCOUNTER — Telehealth: Payer: Self-pay

## 2016-08-26 ENCOUNTER — Other Ambulatory Visit: Payer: Self-pay

## 2016-08-26 ENCOUNTER — Ambulatory Visit (INDEPENDENT_AMBULATORY_CARE_PROVIDER_SITE_OTHER): Payer: PPO | Admitting: Family

## 2016-08-26 ENCOUNTER — Encounter: Payer: Self-pay | Admitting: *Deleted

## 2016-08-26 ENCOUNTER — Emergency Department
Admission: EM | Admit: 2016-08-26 | Discharge: 2016-08-26 | Disposition: A | Discharge status: Home / Self Care | Payer: PPO | Source: Home / Self Care | Attending: Emergency Medicine | Admitting: Emergency Medicine

## 2016-08-26 VITALS — BP 120/50 | HR 100 | Temp 98.0°F

## 2016-08-26 DIAGNOSIS — Z87891 Personal history of nicotine dependence: Secondary | ICD-10-CM | POA: Insufficient documentation

## 2016-08-26 DIAGNOSIS — E860 Dehydration: Secondary | ICD-10-CM | POA: Insufficient documentation

## 2016-08-26 DIAGNOSIS — K858 Other acute pancreatitis without necrosis or infection: Secondary | ICD-10-CM | POA: Diagnosis not present

## 2016-08-26 DIAGNOSIS — Z85828 Personal history of other malignant neoplasm of skin: Secondary | ICD-10-CM | POA: Insufficient documentation

## 2016-08-26 DIAGNOSIS — R1084 Generalized abdominal pain: Secondary | ICD-10-CM

## 2016-08-26 DIAGNOSIS — Z7982 Long term (current) use of aspirin: Secondary | ICD-10-CM | POA: Insufficient documentation

## 2016-08-26 DIAGNOSIS — N183 Chronic kidney disease, stage 3 (moderate): Secondary | ICD-10-CM

## 2016-08-26 DIAGNOSIS — R110 Nausea: Secondary | ICD-10-CM

## 2016-08-26 DIAGNOSIS — N390 Urinary tract infection, site not specified: Secondary | ICD-10-CM

## 2016-08-26 DIAGNOSIS — Z79899 Other long term (current) drug therapy: Secondary | ICD-10-CM

## 2016-08-26 DIAGNOSIS — E039 Hypothyroidism, unspecified: Secondary | ICD-10-CM | POA: Insufficient documentation

## 2016-08-26 DIAGNOSIS — R627 Adult failure to thrive: Secondary | ICD-10-CM | POA: Diagnosis not present

## 2016-08-26 LAB — CBC
HCT: 33.4 % — ABNORMAL LOW (ref 35.0–47.0)
HEMOGLOBIN: 11 g/dL — AB (ref 12.0–16.0)
MCH: 29.1 pg (ref 26.0–34.0)
MCHC: 32.8 g/dL (ref 32.0–36.0)
MCV: 88.5 fL (ref 80.0–100.0)
PLATELETS: 175 10*3/uL (ref 150–440)
RBC: 3.77 MIL/uL — AB (ref 3.80–5.20)
RDW: 22.2 % — ABNORMAL HIGH (ref 11.5–14.5)
WBC: 4.9 10*3/uL (ref 3.6–11.0)

## 2016-08-26 LAB — COMPREHENSIVE METABOLIC PANEL
ALK PHOS: 244 U/L — AB (ref 38–126)
ALT: 435 U/L — AB (ref 14–54)
ANION GAP: 8 (ref 5–15)
AST: 385 U/L — ABNORMAL HIGH (ref 15–41)
Albumin: 3.8 g/dL (ref 3.5–5.0)
BUN: 20 mg/dL (ref 6–20)
CALCIUM: 10.1 mg/dL (ref 8.9–10.3)
CO2: 26 mmol/L (ref 22–32)
CREATININE: 0.86 mg/dL (ref 0.44–1.00)
Chloride: 104 mmol/L (ref 101–111)
Glucose, Bld: 109 mg/dL — ABNORMAL HIGH (ref 65–99)
Potassium: 4.1 mmol/L (ref 3.5–5.1)
SODIUM: 138 mmol/L (ref 135–145)
TOTAL PROTEIN: 7.5 g/dL (ref 6.5–8.1)
Total Bilirubin: 0.4 mg/dL (ref 0.3–1.2)

## 2016-08-26 LAB — URINALYSIS COMPLETE WITH MICROSCOPIC (ARMC ONLY)
BILIRUBIN URINE: NEGATIVE
Bacteria, UA: NONE SEEN
GLUCOSE, UA: NEGATIVE mg/dL
HGB URINE DIPSTICK: NEGATIVE
NITRITE: NEGATIVE
PH: 5 (ref 5.0–8.0)
Protein, ur: 30 mg/dL — AB
Specific Gravity, Urine: 1.023 (ref 1.005–1.030)

## 2016-08-26 LAB — LIPASE, BLOOD: LIPASE: 33 U/L (ref 11–51)

## 2016-08-26 MED ORDER — ONDANSETRON HCL 4 MG/2ML IJ SOLN
INTRAMUSCULAR | Status: AC
  Filled 2016-08-26: qty 2

## 2016-08-26 MED ORDER — SODIUM CHLORIDE 0.9 % IV SOLN
Freq: Once | INTRAVENOUS | Status: AC
  Administered 2016-08-26: 13:00:00 via INTRAVENOUS

## 2016-08-26 MED ORDER — MORPHINE SULFATE (PF) 4 MG/ML IV SOLN
4.0000 mg | Freq: Once | INTRAVENOUS | Status: AC
  Administered 2016-08-26: 4 mg via INTRAVENOUS

## 2016-08-26 MED ORDER — ONDANSETRON HCL 4 MG/2ML IJ SOLN
4.0000 mg | Freq: Once | INTRAMUSCULAR | Status: AC
  Administered 2016-08-26: 4 mg via INTRAVENOUS

## 2016-08-26 MED ORDER — METOCLOPRAMIDE HCL 10 MG PO TABS: 10.0000 mg | ORAL_TABLET | Freq: Three times a day (TID) | ORAL | 1 refills | Status: AC | PRN

## 2016-08-26 MED ORDER — HYDROCODONE-ACETAMINOPHEN 5-325 MG PO TABS: 1.0000 | ORAL_TABLET | Freq: Four times a day (QID) | ORAL | 0 refills | Status: DC | PRN

## 2016-08-26 MED ORDER — PROMETHAZINE HCL 25 MG PO TABS: 25.0000 mg | ORAL_TABLET | Freq: Four times a day (QID) | ORAL | 0 refills | Status: AC | PRN

## 2016-08-26 MED ORDER — SODIUM CHLORIDE 0.9 % IV SOLN
Freq: Once | INTRAVENOUS | Status: AC
  Administered 2016-08-26: 14:00:00 via INTRAVENOUS

## 2016-08-26 MED ORDER — MORPHINE SULFATE (PF) 2 MG/ML IV SOLN
INTRAVENOUS | Status: AC
  Filled 2016-08-26: qty 2

## 2016-08-26 NOTE — Progress Notes (Signed)
Pre visit review using our clinic review tool, if applicable. No additional management support is needed unless otherwise documented below in the visit note. 

## 2016-08-26 NOTE — Patient Instructions (Signed)
Advised patient to go immediately to closest emergency department. Patient and I agreed with this plan due to urgent nature of symptoms.     

## 2016-08-26 NOTE — ED Triage Notes (Signed)
States she was sent from PCP for fluids and nausea meds, pt states she was recently diagnosed with metastatic cancer last week, states lung and liver and pancreas cancer

## 2016-08-26 NOTE — ED Provider Notes (Signed)
[facility] Emergency Department Provider Note        Time seen: ----------------------------------------- 12:48 PM on 08/26/2016 -----------------------------------------    I have reviewed the triage vital signs and the nursing notes.   HISTORY  Chief Complaint Nausea    HPI Meagan Zavala is a 70 y.o. female who presents the ER for dehydration and nausea. She was sent by her primary care doctor for fluids. Patient states she was recently diagnosed with metastatic cancer last week. She states she has liver and pancreas cancer with possible lung cancer. She denies fevers or chills, complains of weakness and dry mouth.   Past Medical History:  Diagnosis Date  . Anemia   . Arthritis   . Basal cell carcinoma of face   . BULIMIA many decades of this   patient keeps alive by drinking ensure that she cannot purge  . GERD (gastroesophageal reflux disease)   . Hypercholesteremia   . KIDNEY DISEASE, CHRONIC, STAGE III 2010   Cr 1.3 -> .84, probably from NSAID's and lasix  . LEAKAGE, CONTINUOUS URINE 07/30/2009  . LOW BACK PAIN, CHRONIC    takes tylenol  . Macular degeneration disease    Dingledein, now [place]  . Major depressive disorder, recurrent episode, severe ([place])    well treated with SSRI's  . OSTEOPOROSIS    severe, treated with infusions, followed by endocrine in [place]  . Personal history of tobacco use, presenting hazards to health 08/11/2016  . Wears dentures    full upper and lower    Patient Active Problem List   Diagnosis Date Noted  . Pancreatic mass 08/18/2016  . Personal history of tobacco use, presenting hazards to health 08/11/2016  . Lung nodule, solitary 08/10/2016  . IDA (iron deficiency anemia) 06/09/2016  . Vitamin D deficiency 04/09/2016  . Hypothyroidism 04/09/2016  . Intention tremor 01/10/2016  . Anemia of chronic disease 11/27/2015  . Fatty liver disease, nonalcoholic [id number]  . Other and unspecified  hyperlipidemia 09/01/2013  . Medicare annual wellness visit, subsequent 09/16/2012  . Polyarthritis 04/16/2012  . Macular degeneration disease   . Neck pain 03/03/2011  . Insomnia 11/25/2010  . MCI (mild cognitive impairment) with memory loss 11/18/2010  . BULIMIA 07/30/2009  . Major depressive disorder, recurrent episode, moderate ([place]) 07/30/2009  . GERD 07/30/2009  . KIDNEY DISEASE, CHRONIC, STAGE III 07/30/2009  . LOW BACK PAIN, CHRONIC 07/30/2009  . Osteoporosis 07/30/2009  . Urinary incontinence 07/30/2009    Past Surgical History:  Procedure Laterality Date  . CARPAL TUNNEL RELEASE    . CATARACT EXTRACTION    . COLON SURGERY  1980   partial colectomy  . COLONOSCOPY WITH PROPOFOL N/A 06/10/2016   Procedure: COLONOSCOPY WITH PROPOFOL;  Surgeon: Manya Silvas, MD;  Location: [facility] ENDOSCOPY;  Service: Endoscopy;  Laterality: N/A;  . ECTROPION REPAIR Bilateral 08/13/2015   Procedure: REPAIR OF ECTROPION;  Surgeon: Karle Starch, MD;  Location: [place];  Service: Ophthalmology;  Laterality: Bilateral;  . ESOPHAGOGASTRODUODENOSCOPY (EGD) WITH PROPOFOL N/A 06/10/2016   Procedure: ESOPHAGOGASTRODUODENOSCOPY (EGD) WITH PROPOFOL;  Surgeon: Manya Silvas, MD;  Location[facility] ENDOSCOPY;  Service: Endoscopy;  Laterality: N/A;  . 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 PTOSIS REPAIR Bilateral 08/13/2015   Procedure: PTOSIS REPAIR;  Surgeon: Karle Starch, MD;  Location: [place];  Service: Ophthalmology;  Laterality: Bilateral;  . SURGICAL EXCISION OF EXCESSIVE SKIN  1979  . TUBAL LIGATION  Allergies Patient has no known allergies.  Social History Social History  Substance Use Topics  . Smoking status: Former Smoker    Packs/day: 1.50    Years: 40.00    Quit date: 10/02/2010  . Smokeless tobacco: Never Used  . 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 Cardiovascular: Negative for chest pain. Respiratory: Negative for shortness of breath. Gastrointestinal: Positive for abdominal pain, vomiting Genitourinary: Negative for dysuria. Musculoskeletal: Negative for back pain. Skin: Negative for rash. Neurological: Negative for headaches, positive for weakness  10-point ROS otherwise negative.  ____________________________________________   PHYSICAL EXAM:  VITAL SIGNS: ED Triage Vitals  Enc Vitals Group     BP 08/26/16 1202 110/62     Pulse Rate 08/26/16 1202 76     Resp 08/26/16 1202 18     Temp 08/26/16 1202 97.8 F (36.6 C)     Temp Source 08/26/16 1202 Oral     SpO2 08/26/16 1202 98 %     Weight 08/26/16 1203 120 lb (54.4 kg)     Height 08/26/16 1203 5\' 1"  (1.549 m)     Head Circumference --      Peak Flow --      Pain Score 08/26/16 1203 8     Pain Loc --      Pain Edu? --      Excl. in [place]? --     Constitutional: Alert and oriented. Chronically ill-appearing, in no distress Eyes: Conjunctivae are normal. PERRL. Normal extraocular movements. ENT   Head: Normocephalic and atraumatic.   Nose: No congestion/rhinnorhea.   Mouth/Throat: Mucous membranes are moist.   Neck: No stridor. Cardiovascular: Normal rate, regular rhythm. No murmurs, rubs, or gallops. Respiratory: Normal respiratory effort without tachypnea nor retractions. Breath sounds are clear and equal bilaterally. No wheezes/rales/rhonchi. Gastrointestinal: Nonfocal abdominal tenderness, normal bowel sounds. Musculoskeletal: Nontender with normal range of motion in all extremities. No lower extremity tenderness nor edema. Neurologic:  Normal speech and language. No gross focal neurologic deficits are appreciated.  Skin:  Skin is warm, dry and intact. No rash noted. Psychiatric: Mood and affect are normal. Speech and behavior are normal.  ____________________________________________  EKG: Interpreted by me. Sinus rhythm with rate of 80 bpm,  normal PR interval, low voltage, normal QT.  ____________________________________________  ED COURSE:  Pertinent labs & imaging results that were available during my care of the patient were reviewed by me and considered in my medical decision making (see chart for details). Clinical Course   Patient presents to the ER and likely dehydrated needed fluids. We will give IV fluids and reevaluate with basic labs.  Procedures ____________________________________________   LABS (pertinent positives/negatives)  Labs Reviewed  COMPREHENSIVE METABOLIC PANEL - Abnormal; Notable for the following:       Result Value   Glucose, Bld 109 (*)    AST 385 (*)    ALT 435 (*)    Alkaline Phosphatase 244 (*)    All other components within normal limits  CBC - Abnormal; Notable for the following:    RBC 3.77 (*)    Hemoglobin 11.0 (*)    HCT 33.4 (*)    RDW 22.2 (*)    All other components within normal limits  URINALYSIS COMPLETEWITH MICROSCOPIC ([facility] ONLY) - Abnormal; Notable for the following:    Color, Urine YELLOW (*)    APPearance CLEAR (*)  Ketones, ur TRACE (*)    Protein, ur 30 (*)    Leukocytes, UA 1+ (*)    Squamous Epithelial / LPF 0-5 (*)    All other components within normal limits  LIPASE, BLOOD   ____________________________________________  FINAL ASSESSMENT AND PLAN  Dehydration, nausea, UTI  Plan: Patient with labs as dictated above. Patient is in no acute distress, will be discharged with antiemetics, pain medicine and we will have her continue a course of antibiotics for UTI. She is stable for outpatient follow-up with her doctor. 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   Earleen Newport, MD   Note: This dictation was prepared with Dragon dictation. Any transcriptional errors that result from this process are unintentional    Earleen Newport, MD 08/26/16 1438

## 2016-08-26 NOTE — Progress Notes (Signed)
Subjective:    Patient ID: Meagan Zavala, female    DOB: 31-Jan-1946, 70 y.o.   MRN: [id number]  CC: Meagan Zavala is a 70 y.o. female who presents today for an acute visit.    HPI: CC: nausea and dry heaving 5 days, unchanged. Endorses left sided abdominal pain. Feels 'very weak.' Lives alone.  Recently diagnosed with pancreatic cancer.     11/20 CT abdomen shows large pancreatic mass, mets to liver.   HISTORY:  Past Medical History:  Diagnosis Date  . Anemia   . Arthritis   . Basal cell carcinoma of face   . BULIMIA many decades of this   patient keeps alive by drinking ensure that she cannot purge  . GERD (gastroesophageal reflux disease)   . Hypercholesteremia   . KIDNEY DISEASE, CHRONIC, STAGE III 2010   Cr 1.3 -> .84, probably from NSAID's and lasix  . LEAKAGE, CONTINUOUS URINE 07/30/2009  . LOW BACK PAIN, CHRONIC    takes tylenol  . Macular degeneration disease    Dingledein, now [place]  . Major depressive disorder, recurrent episode, severe ([place])    well treated with SSRI's  . OSTEOPOROSIS    severe, treated with infusions, followed by endocrine in [place]  . Personal history of tobacco use, presenting hazards to health 08/11/2016  . Wears dentures    full upper and lower   Past Surgical History:  Procedure Laterality Date  . CARPAL TUNNEL RELEASE    . CATARACT EXTRACTION    . COLON SURGERY  1980   partial colectomy  . COLONOSCOPY WITH PROPOFOL N/A 06/10/2016   Procedure: COLONOSCOPY WITH PROPOFOL;  Surgeon: Manya Silvas, MD;  Location: [facility] ENDOSCOPY;  Service: Endoscopy;  Laterality: N/A;  . ECTROPION REPAIR Bilateral 08/13/2015   Procedure: REPAIR OF ECTROPION;  Surgeon: Karle Starch, MD;  Location: [place];  Service: Ophthalmology;  Laterality: Bilateral;  . ESOPHAGOGASTRODUODENOSCOPY (EGD) WITH PROPOFOL N/A 06/10/2016   Procedure: ESOPHAGOGASTRODUODENOSCOPY (EGD) WITH PROPOFOL;  Surgeon: Manya Silvas, MD;  Location: [facility]  ENDOSCOPY;  Service: Endoscopy;  Laterality: N/A;  . 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 PTOSIS REPAIR Bilateral 08/13/2015   Procedure: PTOSIS REPAIR;  Surgeon: Karle Starch, MD;  Location: [place];  Service: Ophthalmology;  Laterality: Bilateral;  . SURGICAL EXCISION OF EXCESSIVE SKIN  1979  . TUBAL LIGATION     Family History  Problem Relation Age of Onset  . Cancer Mother     breast,   . Breast cancer Mother   . Cancer Father     lung  . Cancer Sister     lung, brain  . Cancer Brother     throat    Allergies: Patient has no known allergies. Current Outpatient Prescriptions on File Prior to Visit  Medication Sig Dispense Refill  . aspirin 81 MG tablet Take 1 tablet (81 mg total) by mouth daily. 30 tablet 11  . BIOTIN PO Take by mouth.    . calcium citrate-vitamin D 200-200 MG-UNIT TABS Take 3 tablets by mouth daily.     . cephALEXin (KEFLEX) 500 MG capsule Take 1 capsule (500 mg total) by mouth 3 (three) times daily. 30 capsule 0  . ciprofloxacin (CIPRO) 250 MG tablet     . clonazePAM (KLONOPIN) 0.5 MG tablet 0.25 mg 2 (two) times daily as needed.   0  . Cyanocobalamin (RA VITAMIN B-12 TR) 1000 MCG TBCR Take 1 tablet  by mouth daily.     Marland Kitchen docusate sodium (COLACE) 100 MG capsule Take 100 mg by mouth 2 (two) times daily.    Marland Kitchen donepezil (ARICEPT) 10 MG tablet Take 10 mg by mouth.    . fish oil-omega-3 fatty acids 1000 MG capsule Take 1 capsule by mouth daily.    Marland Kitchen FLUoxetine HCl 60 MG TABS TAKE ONE TABLET BY MOUTH ONCE DAILY 90 tablet 1  . gabapentin (NEURONTIN) 100 MG capsule Take 100 mg by mouth 4 (four) times daily.    Marland Kitchen HYDROcodone-acetaminophen (NORCO) 10-325 MG tablet Take 1 tablet by mouth every 6 (six) hours as needed. As needed for severe pain 120 tablet 0  . meloxicam (MOBIC) 7.5 MG tablet     . mirtazapine (REMERON) 15 MG tablet Take 1 tablet (15 mg total) by mouth at bedtime. 90 tablet 1  . Multiple Vitamins-Minerals (ICAPS PO)  Take by mouth.    Marland Kitchen omeprazole (PRILOSEC) 40 MG capsule TAKE ONE CAPSULE BY MOUTH ONCE DAILY 90 capsule 1  . oxybutynin (DITROPAN XL) 15 MG 24 hr tablet Take 1 tablet (15 mg total) by mouth at bedtime. 90 tablet 2  . pravastatin (PRAVACHOL) 20 MG tablet TAKE ONE TABLET BY MOUTH ONCE DAILY 90 tablet 3  . predniSONE (DELTASONE) 10 MG tablet Take 2 tablets (20 mg total) by mouth daily. 6 tablet 0  . promethazine (PHENERGAN) 12.5 MG tablet Take 1 tablet (12.5 mg total) by mouth every 8 (eight) hours as needed for nausea or vomiting. 20 tablet 0  . SYNTHROID 25 MCG tablet Take 25 mcg by mouth daily before breakfast.     . traMADol (ULTRAM) 50 MG tablet Take 1 tablet (50 mg total) by mouth every 6 (six) hours as needed for severe pain. 15 tablet 0  . traZODone (DESYREL) 50 MG tablet TAKE ONE TO TWO TABLETS BY MOUTH ONCE DAILY 180 tablet 0  . traZODone (DESYREL) 50 MG tablet TAKE ONE TO TWO TABLETS BY MOUTH ONCE DAILY 180 tablet 0  . Vitamin D, Ergocalciferol, (DRISDOL) 50000 UNITS CAPS Take 50,000 Units by mouth every 14 (fourteen) days.    . vitamin E 400 UNIT capsule Take 400 Units by mouth daily. Reported on 04/07/2016    . zoledronic acid (RECLAST) 5 MG/100ML SOLN Every year.     No current facility-administered medications on file prior to visit.     Social History  Substance Use Topics  . Smoking status: Former Smoker    Packs/day: 1.50    Years: 40.00    Quit date: 10/02/2010  . Smokeless tobacco: Never Used  . 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  Respiratory: Negative for cough.   Cardiovascular: Negative for chest pain and palpitations.  Gastrointestinal: Positive for nausea. Negative for vomiting.      Objective:    BP (!) 120/50   Pulse 100   Temp 98 F (36.7 C) (Oral)   SpO2 98%    Physical Exam  Constitutional: She appears well-developed and well-nourished.  HENT:    Oral mucosa dry.    Eyes: Conjunctivae are normal.  Cardiovascular: Normal rate, regular rhythm, normal heart sounds and normal pulses.   Pulmonary/Chest: Effort normal and breath sounds normal. She has no wheezes. She has no rhonchi. She has no rales.  Neurological: She is alert.  Skin: Skin is warm and dry.  Skin  tenting  Psychiatric: She has a normal mood and affect. Her speech is normal and behavior is normal. Thought content normal.  Vitals reviewed.      Assessment & Plan:   1. Nausea without vomiting Patient does not look well and appears very dehydrated. Hypotensive, slightly tachycardic. Patient and I jointly agreed she needed to go to the emergency room likely for IV fluids, pain management. Patient agreed and friend states she will drive her to [facility]. Report given to triage RN.    I am having Ms. Ysidro Evert maintain her zoledronic acid, Vitamin D (Ergocalciferol), calcium citrate-vitamin D, Multiple Vitamins-Minerals (ICAPS PO), fish oil-omega-3 fatty acids, vitamin E, aspirin, clonazePAM, traZODone, docusate sodium, pravastatin, mirtazapine, SYNTHROID, oxybutynin, FLUoxetine HCl, donepezil, Cyanocobalamin, predniSONE, gabapentin, cephALEXin, traMADol, ciprofloxacin, meloxicam, BIOTIN PO, traZODone, omeprazole, HYDROcodone-acetaminophen, and promethazine.   No orders of the defined types were placed in this encounter.   Return precautions given.   Risks, benefits, and alternatives of the medications and treatment plan prescribed today were discussed, and patient expressed understanding.   Education regarding symptom management and diagnosis given to patient on AVS.  Continue to follow with TULLO, Aris Everts, MD for routine health maintenance.   [name] and I agreed with plan.   Mable Paris, FNP

## 2016-08-26 NOTE — Telephone Encounter (Signed)
Promethazine sent

## 2016-08-26 NOTE — Telephone Encounter (Signed)
PA for Promethazine completed on cover my meds

## 2016-08-29 ENCOUNTER — Inpatient Hospital Stay
Admission: EM | Admit: 2016-08-29 | Discharge: 2016-09-02 | DRG: 438 | Disposition: A | Discharge status: Home Health | Payer: PPO | Attending: Internal Medicine | Admitting: Internal Medicine

## 2016-08-29 ENCOUNTER — Other Ambulatory Visit: Payer: Self-pay

## 2016-08-29 DIAGNOSIS — R945 Abnormal results of liver function studies: Secondary | ICD-10-CM

## 2016-08-29 DIAGNOSIS — R627 Adult failure to thrive: Secondary | ICD-10-CM | POA: Diagnosis not present

## 2016-08-29 DIAGNOSIS — F332 Major depressive disorder, recurrent severe without psychotic features: Secondary | ICD-10-CM | POA: Diagnosis present

## 2016-08-29 DIAGNOSIS — C250 Malignant neoplasm of head of pancreas: Secondary | ICD-10-CM | POA: Diagnosis present

## 2016-08-29 DIAGNOSIS — K838 Other specified diseases of biliary tract: Secondary | ICD-10-CM | POA: Diagnosis not present

## 2016-08-29 DIAGNOSIS — F502 Bulimia nervosa: Secondary | ICD-10-CM | POA: Diagnosis not present

## 2016-08-29 DIAGNOSIS — Z7952 Long term (current) use of systemic steroids: Secondary | ICD-10-CM

## 2016-08-29 DIAGNOSIS — D649 Anemia, unspecified: Secondary | ICD-10-CM | POA: Diagnosis not present

## 2016-08-29 DIAGNOSIS — R7989 Other specified abnormal findings of blood chemistry: Secondary | ICD-10-CM | POA: Diagnosis not present

## 2016-08-29 DIAGNOSIS — E860 Dehydration: Secondary | ICD-10-CM | POA: Diagnosis not present

## 2016-08-29 DIAGNOSIS — E46 Unspecified protein-calorie malnutrition: Secondary | ICD-10-CM | POA: Diagnosis not present

## 2016-08-29 DIAGNOSIS — G893 Neoplasm related pain (acute) (chronic): Secondary | ICD-10-CM | POA: Diagnosis not present

## 2016-08-29 DIAGNOSIS — H3530 Unspecified macular degeneration: Secondary | ICD-10-CM | POA: Diagnosis not present

## 2016-08-29 DIAGNOSIS — Z79899 Other long term (current) drug therapy: Secondary | ICD-10-CM | POA: Diagnosis not present

## 2016-08-29 DIAGNOSIS — E7800 Pure hypercholesterolemia, unspecified: Secondary | ICD-10-CM | POA: Diagnosis not present

## 2016-08-29 DIAGNOSIS — D62 Acute posthemorrhagic anemia: Secondary | ICD-10-CM | POA: Diagnosis not present

## 2016-08-29 DIAGNOSIS — Z87891 Personal history of nicotine dependence: Secondary | ICD-10-CM

## 2016-08-29 DIAGNOSIS — Z791 Long term (current) use of non-steroidal anti-inflammatories (NSAID): Secondary | ICD-10-CM

## 2016-08-29 DIAGNOSIS — Z801 Family history of malignant neoplasm of trachea, bronchus and lung: Secondary | ICD-10-CM

## 2016-08-29 DIAGNOSIS — C787 Secondary malignant neoplasm of liver and intrahepatic bile duct: Secondary | ICD-10-CM | POA: Diagnosis present

## 2016-08-29 DIAGNOSIS — K219 Gastro-esophageal reflux disease without esophagitis: Secondary | ICD-10-CM | POA: Diagnosis not present

## 2016-08-29 DIAGNOSIS — C779 Secondary and unspecified malignant neoplasm of lymph node, unspecified: Secondary | ICD-10-CM | POA: Diagnosis present

## 2016-08-29 DIAGNOSIS — K858 Other acute pancreatitis without necrosis or infection: Secondary | ICD-10-CM | POA: Diagnosis not present

## 2016-08-29 DIAGNOSIS — Z6824 Body mass index (BMI) 24.0-24.9, adult: Secondary | ICD-10-CM | POA: Diagnosis not present

## 2016-08-29 DIAGNOSIS — K921 Melena: Secondary | ICD-10-CM | POA: Diagnosis not present

## 2016-08-29 DIAGNOSIS — K831 Obstruction of bile duct: Secondary | ICD-10-CM | POA: Diagnosis present

## 2016-08-29 DIAGNOSIS — Z66 Do not resuscitate: Secondary | ICD-10-CM | POA: Diagnosis present

## 2016-08-29 DIAGNOSIS — M6281 Muscle weakness (generalized): Secondary | ICD-10-CM

## 2016-08-29 DIAGNOSIS — E869 Volume depletion, unspecified: Secondary | ICD-10-CM

## 2016-08-29 DIAGNOSIS — Z803 Family history of malignant neoplasm of breast: Secondary | ICD-10-CM

## 2016-08-29 DIAGNOSIS — M810 Age-related osteoporosis without current pathological fracture: Secondary | ICD-10-CM | POA: Diagnosis not present

## 2016-08-29 DIAGNOSIS — Z85828 Personal history of other malignant neoplasm of skin: Secondary | ICD-10-CM | POA: Diagnosis not present

## 2016-08-29 DIAGNOSIS — K859 Acute pancreatitis without necrosis or infection, unspecified: Secondary | ICD-10-CM

## 2016-08-29 DIAGNOSIS — K922 Gastrointestinal hemorrhage, unspecified: Secondary | ICD-10-CM

## 2016-08-29 DIAGNOSIS — Z9884 Bariatric surgery status: Secondary | ICD-10-CM | POA: Diagnosis not present

## 2016-08-29 DIAGNOSIS — C259 Malignant neoplasm of pancreas, unspecified: Secondary | ICD-10-CM | POA: Diagnosis present

## 2016-08-29 DIAGNOSIS — Z7982 Long term (current) use of aspirin: Secondary | ICD-10-CM | POA: Diagnosis not present

## 2016-08-29 DIAGNOSIS — R262 Difficulty in walking, not elsewhere classified: Secondary | ICD-10-CM

## 2016-08-29 DIAGNOSIS — C786 Secondary malignant neoplasm of retroperitoneum and peritoneum: Secondary | ICD-10-CM | POA: Diagnosis not present

## 2016-08-29 LAB — BASIC METABOLIC PANEL
ANION GAP: 9 (ref 5–15)
BUN: 17 mg/dL (ref 6–20)
CHLORIDE: 101 mmol/L (ref 101–111)
CO2: 25 mmol/L (ref 22–32)
Calcium: 9.5 mg/dL (ref 8.9–10.3)
Creatinine, Ser: 0.88 mg/dL (ref 0.44–1.00)
GFR calc Af Amer: >60 mL/min (ref >60)
GFR calc non Af Amer: >60 mL/min (ref >60)
Glucose, Bld: 99 mg/dL (ref 65–99)
POTASSIUM: 4 mmol/L (ref 3.5–5.1)
SODIUM: 135 mmol/L (ref 135–145)

## 2016-08-29 LAB — CBC
HEMATOCRIT: 30.3 % — AB (ref 35.0–47.0)
HEMOGLOBIN: 10.2 g/dL — AB (ref 12.0–16.0)
MCH: 30 pg (ref 26.0–34.0)
MCHC: 33.7 g/dL (ref 32.0–36.0)
MCV: 89 fL (ref 80.0–100.0)
Platelets: 150 10*3/uL (ref 150–440)
RBC: 3.41 MIL/uL — AB (ref 3.80–5.20)
RDW: 22.3 % — AB (ref 11.5–14.5)
WBC: 3.6 10*3/uL (ref 3.6–11.0)

## 2016-08-29 LAB — HEPATIC FUNCTION PANEL
ALBUMIN: 3.5 g/dL (ref 3.5–5.0)
ALK PHOS: 337 U/L — AB (ref 38–126)
ALT: 493 U/L — AB (ref 14–54)
AST: 386 U/L — AB (ref 15–41)
Bilirubin, Direct: 0.3 mg/dL (ref 0.1–0.5)
Indirect Bilirubin: 0.2 mg/dL — ABNORMAL LOW (ref 0.3–0.9)
TOTAL PROTEIN: 6.9 g/dL (ref 6.5–8.1)
Total Bilirubin: 0.5 mg/dL (ref 0.3–1.2)

## 2016-08-29 LAB — LIPASE, BLOOD: Lipase: 223 U/L — ABNORMAL HIGH (ref 11–51)

## 2016-08-29 MED ORDER — BISACODYL 10 MG RE SUPP: 10.0000 mg | Freq: Every day | RECTAL | Status: DC | PRN

## 2016-08-29 MED ORDER — MIRTAZAPINE 15 MG PO TABS
15.0000 mg | ORAL_TABLET | Freq: Every day | ORAL | Status: DC
  Administered 2016-08-29 – 2016-09-01 (×4): 15 mg via ORAL
  Filled 2016-08-29 (×4): qty 1

## 2016-08-29 MED ORDER — VITAMIN B-12 1000 MCG PO TABS
1000.0000 ug | ORAL_TABLET | Freq: Every day | ORAL | Status: DC
  Administered 2016-08-30 – 2016-09-02 (×4): 1000 ug via ORAL
  Filled 2016-08-29 (×5): qty 1

## 2016-08-29 MED ORDER — SODIUM CHLORIDE 0.9 % IV SOLN
8.0000 mg/h | INTRAVENOUS | Status: DC
  Administered 2016-08-29 – 2016-08-30 (×2): 8 mg/h via INTRAVENOUS
  Filled 2016-08-29 (×2): qty 80

## 2016-08-29 MED ORDER — SODIUM CHLORIDE 0.9 % IV SOLN
INTRAVENOUS | Status: DC
  Administered 2016-08-29 – 2016-09-02 (×8): via INTRAVENOUS

## 2016-08-29 MED ORDER — ONDANSETRON HCL 4 MG PO TABS: 4.0000 mg | ORAL_TABLET | Freq: Four times a day (QID) | ORAL | Status: DC | PRN

## 2016-08-29 MED ORDER — CLONAZEPAM 0.5 MG PO TABS
0.2500 mg | ORAL_TABLET | Freq: Two times a day (BID) | ORAL | Status: DC | PRN
  Administered 2016-08-31: 0.25 mg via ORAL
  Filled 2016-08-29: qty 1

## 2016-08-29 MED ORDER — SODIUM CHLORIDE 0.9 % IV BOLUS (SEPSIS)
1000.0000 mL | Freq: Once | INTRAVENOUS | Status: AC
  Administered 2016-08-29: 1000 mL via INTRAVENOUS

## 2016-08-29 MED ORDER — METOCLOPRAMIDE HCL 10 MG PO TABS
10.0000 mg | ORAL_TABLET | Freq: Three times a day (TID) | ORAL | Status: DC | PRN
  Filled 2016-08-29: qty 1

## 2016-08-29 MED ORDER — PREDNISONE 20 MG PO TABS
20.0000 mg | ORAL_TABLET | Freq: Every day | ORAL | Status: DC
  Administered 2016-08-30 – 2016-09-02 (×4): 20 mg via ORAL
  Filled 2016-08-29 (×4): qty 1

## 2016-08-29 MED ORDER — ACETAMINOPHEN 650 MG RE SUPP: 650.0000 mg | Freq: Four times a day (QID) | RECTAL | Status: DC | PRN

## 2016-08-29 MED ORDER — ONDANSETRON HCL 4 MG/2ML IJ SOLN: 4.0000 mg | Freq: Four times a day (QID) | INTRAMUSCULAR | Status: DC | PRN

## 2016-08-29 MED ORDER — TRAZODONE HCL 50 MG PO TABS
50.0000 mg | ORAL_TABLET | Freq: Every day | ORAL | Status: DC
  Administered 2016-08-29 – 2016-09-01 (×4): 50 mg via ORAL
  Filled 2016-08-29 (×4): qty 1

## 2016-08-29 MED ORDER — GABAPENTIN 100 MG PO CAPS
100.0000 mg | ORAL_CAPSULE | Freq: Four times a day (QID) | ORAL | Status: DC
  Administered 2016-08-29 – 2016-09-02 (×13): 100 mg via ORAL
  Filled 2016-08-29 (×13): qty 1

## 2016-08-29 MED ORDER — ACETAMINOPHEN 325 MG PO TABS: 650.0000 mg | ORAL_TABLET | Freq: Four times a day (QID) | ORAL | Status: DC | PRN

## 2016-08-29 MED ORDER — FLUOXETINE HCL 20 MG PO CAPS
60.0000 mg | ORAL_CAPSULE | Freq: Every day | ORAL | Status: DC
  Administered 2016-08-30 – 2016-09-02 (×4): 60 mg via ORAL
  Filled 2016-08-29 (×4): qty 3

## 2016-08-29 MED ORDER — DONEPEZIL HCL 5 MG PO TABS
10.0000 mg | ORAL_TABLET | Freq: Every day | ORAL | Status: DC
  Administered 2016-08-29 – 2016-09-01 (×4): 10 mg via ORAL
  Filled 2016-08-29 (×4): qty 2

## 2016-08-29 MED ORDER — MORPHINE SULFATE (PF) 4 MG/ML IV SOLN
2.0000 mg | INTRAVENOUS | Status: DC | PRN
  Administered 2016-08-29 – 2016-09-02 (×9): 2 mg via INTRAVENOUS
  Filled 2016-08-29 (×9): qty 1

## 2016-08-29 MED ORDER — DOCUSATE SODIUM 100 MG PO CAPS
100.0000 mg | ORAL_CAPSULE | Freq: Two times a day (BID) | ORAL | Status: DC
  Administered 2016-08-29 – 2016-09-02 (×8): 100 mg via ORAL
  Filled 2016-08-29 (×8): qty 1

## 2016-08-29 MED ORDER — SODIUM CHLORIDE 0.9 % IV SOLN
80.0000 mg | Freq: Once | INTRAVENOUS | Status: AC
  Administered 2016-08-29: 80 mg via INTRAVENOUS
  Filled 2016-08-29 (×2): qty 80

## 2016-08-29 MED ORDER — PANTOPRAZOLE SODIUM 40 MG IV SOLR: 40.0000 mg | Freq: Two times a day (BID) | INTRAVENOUS | Status: DC

## 2016-08-29 MED ORDER — HYDROCODONE-ACETAMINOPHEN 10-325 MG PO TABS
1.0000 | ORAL_TABLET | Freq: Four times a day (QID) | ORAL | Status: DC | PRN
  Administered 2016-08-30 – 2016-09-02 (×6): 1 via ORAL
  Filled 2016-08-29 (×6): qty 1

## 2016-08-29 MED ORDER — LEVOTHYROXINE SODIUM 25 MCG PO TABS
25.0000 ug | ORAL_TABLET | Freq: Every day | ORAL | Status: DC
  Administered 2016-08-30 – 2016-09-02 (×4): 25 ug via ORAL
  Filled 2016-08-29 (×4): qty 1

## 2016-08-29 NOTE — ED Triage Notes (Signed)
Pt arrives to ER via POV c/o dehydration due to not eating or drinking X 2 days. Pt CA patient. Pt alert and oriented X4, active, cooperative, pt in NAD. RR even and unlabored, color WNL.  Denies NVD. Fatigue and weakness. PT pale and thin.

## 2016-08-29 NOTE — ED Notes (Signed)
Dr Doy Hutching in to see pt.

## 2016-08-29 NOTE — H&P (Signed)
History and Physical    Meagan Zavala [id number] DOB: Feb 29, 1946 DOA: 08/29/2016  Referring physician: Dr. Karma Greaser PCP: Crecencio Mc, MD  Specialists: Dr. Rogue Bussing  Chief Complaint: abdominal pain with anorexia  HPI: Meagan Zavala is a 70 y.o. female has a past medical history significant for pancreatic cancer now with worsening upper abdominal pain associated with "dark stools" and anorexia. Very weak. Unable to care for self. Presents to ER where hgb is down and lipase is elevated. She is now admited. No fever. Denies Cp or SOB. No vomiting or hematemesis  Review of Systems: The patient denies  fever, vision loss, decreased hearing, hoarseness, chest pain, syncope, dyspnea on exertion, peripheral edema, balance deficits, hemoptysis,  hematochezia, severe indigestion/heartburn, hematuria, incontinence, genital sores, muscle weakness, suspicious skin lesions, transient blindness, difficulty walking, depression, unusual weight change, abnormal bleeding, enlarged lymph nodes, angioedema, and breast masses.   Past Medical History:  Diagnosis Date  . Anemia   . Arthritis   . Basal cell carcinoma of face   . BULIMIA many decades of this   patient keeps alive by drinking ensure that she cannot purge  . GERD (gastroesophageal reflux disease)   . Hypercholesteremia   . KIDNEY DISEASE, CHRONIC, STAGE III 2010   Cr 1.3 -> .84, probably from NSAID's and lasix  . LEAKAGE, CONTINUOUS URINE 07/30/2009  . LOW BACK PAIN, CHRONIC    takes tylenol  . Macular degeneration disease    Dingledein, now [place]  . Major depressive disorder, recurrent episode, severe ([place])    well treated with SSRI's  . OSTEOPOROSIS    severe, treated with infusions, followed by endocrine in [place]  . Personal history of tobacco use, presenting hazards to health 08/11/2016  . Wears dentures    full upper and lower   Past Surgical History:  Procedure Laterality Date  . CARPAL TUNNEL RELEASE    .  CATARACT EXTRACTION    . COLON SURGERY  1980   partial colectomy  . COLONOSCOPY WITH PROPOFOL N/A 06/10/2016   Procedure: COLONOSCOPY WITH PROPOFOL;  Surgeon: Manya Silvas, MD;  Location: [facility] ENDOSCOPY;  Service: Endoscopy;  Laterality: N/A;  . ECTROPION REPAIR Bilateral 08/13/2015   Procedure: REPAIR OF ECTROPION;  Surgeon: Karle Starch, MD;  Location: [place];  Service: Ophthalmology;  Laterality: Bilateral;  . ESOPHAGOGASTRODUODENOSCOPY (EGD) WITH PROPOFOL N/A 06/10/2016   Procedure: ESOPHAGOGASTRODUODENOSCOPY (EGD) WITH PROPOFOL;  Surgeon: Manya Silvas, MD;  Location: [facility] ENDOSCOPY;  Service: Endoscopy;  Laterality: N/A;  . 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 PTOSIS REPAIR Bilateral 08/13/2015   Procedure: PTOSIS REPAIR;  Surgeon: Karle Starch, MD;  Location: [place];  Service: Ophthalmology;  Laterality: Bilateral;  . SURGICAL EXCISION OF EXCESSIVE SKIN  1979  . TUBAL LIGATION     Social History:  reports that she quit smoking about 5 years ago. She has a 60.00 pack-year smoking history. She has never used smokeless tobacco. She reports that she does not drink alcohol or use drugs.  No Known Allergies  Family History  Problem Relation Age of Onset  . Cancer Mother     breast,   . Breast cancer Mother   . Cancer Father     lung  . Cancer Sister     lung, brain  . Cancer Brother     throat    Prior to Admission medications   Medication Sig Start Date End Date Taking? Authorizing  Provider  aspirin 81 MG tablet Take 1 tablet (81 mg total) by mouth daily. 12/05/13   Crecencio Mc, MD  BIOTIN PO Take by mouth.    Historical Provider, MD  calcium citrate-vitamin D 200-200 MG-UNIT TABS Take 3 tablets by mouth daily.     Historical Provider, MD  cephALEXin (KEFLEX) 500 MG capsule Take 1 capsule (500 mg total) by mouth 3 (three) times daily. 08/17/16   Nance Pear, MD  ciprofloxacin (CIPRO) 250 MG tablet  08/15/16    Historical Provider, MD  clonazePAM (KLONOPIN) 0.5 MG tablet 0.25 mg 2 (two) times daily as needed.  11/18/14   Historical Provider, MD  Cyanocobalamin (RA VITAMIN B-12 TR) 1000 MCG TBCR Take 1 tablet by mouth daily.     Historical Provider, MD  docusate sodium (COLACE) 100 MG capsule Take 100 mg by mouth 2 (two) times daily.    Historical Provider, MD  donepezil (ARICEPT) 10 MG tablet Take 10 mg by mouth. 05/12/16   Historical Provider, MD  fish oil-omega-3 fatty acids 1000 MG capsule Take 1 capsule by mouth daily.    Historical Provider, MD  FLUoxetine HCl 60 MG TABS TAKE ONE TABLET BY MOUTH ONCE DAILY 05/25/16   Crecencio Mc, MD  gabapentin (NEURONTIN) 100 MG capsule Take 100 mg by mouth 4 (four) times daily.    Historical Provider, MD  HYDROcodone-acetaminophen (NORCO) 10-325 MG tablet Take 1 tablet by mouth every 6 (six) hours as needed. As needed for severe pain 08/25/16   Crecencio Mc, MD  HYDROcodone-acetaminophen (NORCO/VICODIN) 5-325 MG tablet Take 1 tablet by mouth every 6 (six) hours as needed for moderate pain. 08/26/16   Earleen Newport, MD  meloxicam [facility]) 7.5 MG tablet  08/06/16   Historical Provider, MD  metoCLOPramide (REGLAN) 10 MG tablet Take 1 tablet (10 mg total) by mouth every 8 (eight) hours as needed for nausea or vomiting. 08/26/16   Earleen Newport, MD  mirtazapine (REMERON) 15 MG tablet Take 1 tablet (15 mg total) by mouth at bedtime. 11/25/15   Crecencio Mc, MD  Multiple Vitamins-Minerals (ICAPS PO) Take by mouth.    Historical Provider, MD  omeprazole (PRILOSEC) 40 MG capsule TAKE ONE CAPSULE BY MOUTH ONCE DAILY 08/25/16   Crecencio Mc, MD  oxybutynin (DITROPAN XL) 15 MG 24 hr tablet Take 1 tablet (15 mg total) by mouth at bedtime. 04/07/16   Crecencio Mc, MD  pravastatin (PRAVACHOL) 20 MG tablet TAKE ONE TABLET BY MOUTH ONCE DAILY 11/19/15   Crecencio Mc, MD  predniSONE (DELTASONE) 10 MG tablet Take 2 tablets (20 mg total) by mouth daily. 07/30/16    Lorin Picket, PA-C  promethazine (PHENERGAN) 12.5 MG tablet Take 1 tablet (12.5 mg total) by mouth every 8 (eight) hours as needed for nausea or vomiting. 08/25/16   Crecencio Mc, MD  promethazine (PHENERGAN) 25 MG tablet Take 1 tablet (25 mg total) by mouth every 6 (six) hours as needed for nausea or vomiting. 08/26/16   Earleen Newport, MD  SYNTHROID 25 MCG tablet Take 25 mcg by mouth daily before breakfast.  03/30/16   Historical Provider, MD  traMADol (ULTRAM) 50 MG tablet Take 1 tablet (50 mg total) by mouth every 6 (six) hours as needed for severe pain. 08/17/16 08/17/17  Nance Pear, MD  traZODone (DESYREL) 50 MG tablet TAKE ONE TO TWO TABLETS BY MOUTH ONCE DAILY 06/20/15   Crecencio Mc, MD  traZODone (DESYREL) 50 MG tablet  TAKE ONE TO TWO TABLETS BY MOUTH ONCE DAILY 08/25/16   Crecencio Mc, MD  Vitamin D, Ergocalciferol, (DRISDOL) 50000 UNITS CAPS Take 50,000 Units by mouth every 14 (fourteen) days.    Historical Provider, MD  vitamin E 400 UNIT capsule Take 400 Units by mouth daily. Reported on 04/07/2016    Historical Provider, MD  zoledronic acid (RECLAST) 5 MG/100ML SOLN Every year.    Historical Provider, MD   Physical Exam: Vitals:   08/29/16 1627 08/29/16 1919 08/29/16 1930 08/29/16 2000  BP: (!) 83/55 101/88 (!) 119/56 125/80  Pulse: 69 (!) 136 (!) 55 74  Resp: 18     Temp: 98.1 F (36.7 C)     TempSrc: Oral     SpO2: 98% 97% 100% 95%  Weight: 54.4 kg (120 lb)     Height: 5\' 1"  (1.549 m)        General:  No apparent distress, [place]/AT, cachectic  Eyes: PERRL, EOMI, no scleral icterus, conjunctiva clear  ENT: dry oropharynx without exudate, TM's benign, dentition fair  Neck: supple, no lymphadenopathy. No bruits or thyromegaly  Cardiovascular: regular rate without MRG; 2+ peripheral pulses, no JVD, no peripheral edema  Respiratory: CTA biL, good air movement without wheezing, rhonchi or crackled. Respiratory effort normal  Abdomen: soft,  tender to  palpation in the upper quadrants, positive bowel sounds, no guarding, no rebound  Skin: no rashes or lesions  Musculoskeletal: normal bulk and tone, no joint swelling  Psychiatric: normal mood and affect, A&OX3  Neurologic: CN 2-12 grossly intact, Motor strength 5/5 in all 4 groups with symmetric DTR's and non-focal sensory exam  Labs on Admission:  Basic Metabolic Panel:  Recent Labs Lab 08/26/16 1205 08/29/16 1629  NA 138 135  K 4.1 4.0  CL 104 101  CO2 26 25  GLUCOSE 109* 99  BUN 20 17  CREATININE 0.86 0.88  CALCIUM 10.1 9.5   Liver Function Tests:  Recent Labs Lab 08/26/16 1205 08/29/16 1629  AST 385* 386*  ALT 435* 493*  ALKPHOS 244* 337*  BILITOT 0.4 0.5  PROT 7.5 6.9  ALBUMIN 3.8 3.5    Recent Labs Lab 08/26/16 1205 08/29/16 1629  LIPASE 33 223*   No results for input(s): AMMONIA in the last 168 hours. CBC:  Recent Labs Lab 08/26/16 1205 08/29/16 1629  WBC 4.9 3.6  HGB 11.0* 10.2*  HCT 33.4* 30.3*  MCV 88.5 89.0  PLT 175 150   Cardiac Enzymes: No results for input(s): CKTOTAL, CKMB, CKMBINDEX, TROPONINI in the last 168 hours.  BNP (last 3 results) No results for input(s): BNP in the last 8760 hours.  ProBNP (last 3 results) No results for input(s): PROBNP in the last 8760 hours.  CBG:  Recent Labs Lab 08/25/16 1130  GLUCAP 81    Radiological Exams on Admission: No results found.  EKG: Independently reviewed.  Assessment/Plan Principal Problem:   Pancreatic cancer ([place]) Active Problems:   UGI bleed   Acute pancreatitis   Acute blood loss anemia   Will admit to floor with Protonix drip. Consult GI, follow hgb and guaiac stools. Consult Oncology. Clear liquid diet with IV fluids. Consult PT and CSW. Prognosis poor.  Diet: clear liquids Fluids: NS@100  DVT Prophylaxis: TED hose  Code Status: DNR  Family Communication: yes  Disposition Plan: SNF  Time spent: 55 min

## 2016-08-29 NOTE — ED Provider Notes (Addendum)
[facility] Emergency Department Provider Note  ____________________________________________   First MD Initiated Contact with Patient 08/29/16 1731     (approximate)  I have reviewed the triage vital signs and the nursing notes.   HISTORY  Chief Complaint Dehydration and Anorexia    HPI Meagan Zavala is a 70 y.o. female with a history of recently diagnosed (probable) pancreatic cancer as well as a history of bulimia and her recent visits for decreased by mouth intake who presents for evaluation essentially of failure to thrive.  She is concerned about possible dehydration.  She reports that she really has much of an appetite and recently, especially since her diagnosis with Dr. Rogue Bussing of probable pancreatic cancer, she has not wanted to eat or drink anything, this is not her first visit to the emergency department for IV fluids.  Her family is frustrated because there does not seem to be a plan of care and place as an outpatient.  The patient is stating clearly that it is not that she CANNOT eat or drink, it is that she does not have any interest in doing so.  Her family is quick to point out that she has had some nausea and vomiting that the patient states it is not been severe.  She is not in severe pain.  She denies fever/chills, chest pain, shortness of breath.  Family states she has had some waxing and waning mental status issues.  She was treated recently for a possible UTI.  She is in no acute distress at this time but the family is very concerned that the symptoms of an severe and that they keep having to bring her back for IV fluids.   Past Medical History:  Diagnosis Date  . Anemia   . Arthritis   . Basal cell carcinoma of face   . BULIMIA many decades of this   patient keeps alive by drinking ensure that she cannot purge  . GERD (gastroesophageal reflux disease)   . Hypercholesteremia   . KIDNEY DISEASE, CHRONIC, STAGE III 2010   Cr 1.3 ->  .84, probably from NSAID's and lasix  . LEAKAGE, CONTINUOUS URINE 07/30/2009  . LOW BACK PAIN, CHRONIC    takes tylenol  . Macular degeneration disease    [place], now [place]  . Major depressive disorder, recurrent episode, severe ([place])    well treated with SSRI's  . OSTEOPOROSIS    severe, treated with infusions, followed by endocrine in [place]  . Personal history of tobacco use, presenting hazards to health 08/11/2016  . Wears dentures    full upper and lower    Patient Active Problem List   Diagnosis Date Noted  . UGI bleed 08/29/2016  . Acute pancreatitis 08/29/2016  . Acute blood loss anemia 08/29/2016  . Pancreatic cancer ([place]) 08/29/2016  . Pancreas cancer ([place]) 08/29/2016  . Pancreatic mass 08/18/2016  . Personal history of tobacco use, presenting hazards to health 08/11/2016  . Lung nodule, solitary 08/10/2016  . IDA (iron deficiency anemia) 06/09/2016  . Vitamin D deficiency 04/09/2016  . Hypothyroidism 04/09/2016  . Intention tremor 01/10/2016  . Anemia of chronic disease 11/27/2015  . Fatty liver disease, nonalcoholic [id number]  . Other and unspecified hyperlipidemia 09/01/2013  . Medicare annual wellness visit, subsequent 09/16/2012  . Polyarthritis 04/16/2012  . Macular degeneration disease   . Neck pain 03/03/2011  . Insomnia 11/25/2010  . MCI (mild cognitive impairment) with memory loss 11/18/2010  . BULIMIA 07/30/2009  . Major depressive  disorder, recurrent episode, moderate ([place]) 07/30/2009  . GERD 07/30/2009  . KIDNEY DISEASE, CHRONIC, STAGE III 07/30/2009  . LOW BACK PAIN, CHRONIC 07/30/2009  . Osteoporosis 07/30/2009  . Urinary incontinence 07/30/2009    Past Surgical History:  Procedure Laterality Date  . CARPAL TUNNEL RELEASE    . CATARACT EXTRACTION    . COLON SURGERY  1980   partial colectomy  . COLONOSCOPY WITH PROPOFOL N/A 06/10/2016   Procedure: COLONOSCOPY WITH PROPOFOL;  Surgeon: Manya Silvas, MD;  Location: [facility]  ENDOSCOPY;  Service: Endoscopy;  Laterality: N/A;  . ECTROPION REPAIR Bilateral 08/13/2015   Procedure: REPAIR OF ECTROPION;  Surgeon: Karle Starch, MD;  Location: [place];  Service: Ophthalmology;  Laterality: Bilateral;  . ESOPHAGOGASTRODUODENOSCOPY (EGD) WITH PROPOFOL N/A 06/10/2016   Procedure: ESOPHAGOGASTRODUODENOSCOPY (EGD) WITH PROPOFOL;  Surgeon: Manya Silvas, MD;  Location: [facility] ENDOSCOPY;  Service: Endoscopy;  Laterality: N/A;  . 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 PTOSIS REPAIR Bilateral 08/13/2015   Procedure: PTOSIS REPAIR;  Surgeon: Karle Starch, MD;  Location: [place];  Service: Ophthalmology;  Laterality: Bilateral;  . SURGICAL EXCISION OF EXCESSIVE SKIN  1979  . TUBAL LIGATION      Prior to Admission medications   Medication Sig Start Date End Date Taking? Authorizing Provider  aspirin 81 MG tablet Take 1 tablet (81 mg total) by mouth daily. 12/05/13   Crecencio Mc, MD  BIOTIN PO Take by mouth.    Historical Provider, MD  calcium citrate-vitamin D 200-200 MG-UNIT TABS Take 3 tablets by mouth daily.     Historical Provider, MD  cephALEXin (KEFLEX) 500 MG capsule Take 1 capsule (500 mg total) by mouth 3 (three) times daily. 08/17/16   Nance Pear, MD  ciprofloxacin (CIPRO) 250 MG tablet  08/15/16   Historical Provider, MD  clonazePAM (KLONOPIN) 0.5 MG tablet 0.25 mg 2 (two) times daily as needed.  11/18/14   Historical Provider, MD  Cyanocobalamin (RA VITAMIN B-12 TR) 1000 MCG TBCR Take 1 tablet by mouth daily.     Historical Provider, MD  docusate sodium (COLACE) 100 MG capsule Take 100 mg by mouth 2 (two) times daily.    Historical Provider, MD  donepezil (ARICEPT) 10 MG tablet Take 10 mg by mouth. 05/12/16   Historical Provider, MD  fish oil-omega-3 fatty acids 1000 MG capsule Take 1 capsule by mouth daily.    Historical Provider, MD  FLUoxetine HCl 60 MG TABS TAKE ONE TABLET BY MOUTH ONCE DAILY 05/25/16   Crecencio Mc,  MD  gabapentin (NEURONTIN) 100 MG capsule Take 100 mg by mouth 4 (four) times daily.    Historical Provider, MD  HYDROcodone-acetaminophen (NORCO) 10-325 MG tablet Take 1 tablet by mouth every 6 (six) hours as needed. As needed for severe pain 08/25/16   Crecencio Mc, MD  HYDROcodone-acetaminophen (NORCO/VICODIN) 5-325 MG tablet Take 1 tablet by mouth every 6 (six) hours as needed for moderate pain. 08/26/16   Earleen Newport, MD  meloxicam [facility]) 7.5 MG tablet  08/06/16   Historical Provider, MD  metoCLOPramide (REGLAN) 10 MG tablet Take 1 tablet (10 mg total) by mouth every 8 (eight) hours as needed for nausea or vomiting. 08/26/16   Earleen Newport, MD  mirtazapine (REMERON) 15 MG tablet Take 1 tablet (15 mg total) by mouth at bedtime. 11/25/15   Crecencio Mc, MD  Multiple Vitamins-Minerals (ICAPS PO) Take by mouth.  Historical Provider, MD  omeprazole (PRILOSEC) 40 MG capsule TAKE ONE CAPSULE BY MOUTH ONCE DAILY 08/25/16   Crecencio Mc, MD  oxybutynin (DITROPAN XL) 15 MG 24 hr tablet Take 1 tablet (15 mg total) by mouth at bedtime. 04/07/16   Crecencio Mc, MD  pravastatin (PRAVACHOL) 20 MG tablet TAKE ONE TABLET BY MOUTH ONCE DAILY 11/19/15   Crecencio Mc, MD  predniSONE (DELTASONE) 10 MG tablet Take 2 tablets (20 mg total) by mouth daily. 07/30/16   Lorin Picket, PA-C  promethazine (PHENERGAN) 12.5 MG tablet Take 1 tablet (12.5 mg total) by mouth every 8 (eight) hours as needed for nausea or vomiting. 08/25/16   Crecencio Mc, MD  promethazine (PHENERGAN) 25 MG tablet Take 1 tablet (25 mg total) by mouth every 6 (six) hours as needed for nausea or vomiting. 08/26/16   Earleen Newport, MD  SYNTHROID 25 MCG tablet Take 25 mcg by mouth daily before breakfast.  03/30/16   Historical Provider, MD  traMADol (ULTRAM) 50 MG tablet Take 1 tablet (50 mg total) by mouth every 6 (six) hours as needed for severe pain. 08/17/16 08/17/17  Nance Pear, MD  traZODone (DESYREL) 50 MG  tablet TAKE ONE TO TWO TABLETS BY MOUTH ONCE DAILY 06/20/15   Crecencio Mc, MD  traZODone (DESYREL) 50 MG tablet TAKE ONE TO TWO TABLETS BY MOUTH ONCE DAILY 08/25/16   Crecencio Mc, MD  Vitamin D, Ergocalciferol, (DRISDOL) 50000 UNITS CAPS Take 50,000 Units by mouth every 14 (fourteen) days.    Historical Provider, MD  vitamin E 400 UNIT capsule Take 400 Units by mouth daily. Reported on 04/07/2016    Historical Provider, MD  zoledronic acid (RECLAST) 5 MG/100ML SOLN Every year.    Historical Provider, MD    Allergies Patient has no known allergies.  Family History  Problem Relation Age of Onset  . Cancer Mother     breast,   . Breast cancer Mother   . Cancer Father     lung  . Cancer Sister     lung, brain  . Cancer Brother     throat    Social History Social History  Substance Use Topics  . Smoking status: Former Smoker    Packs/day: 1.50    Years: 40.00    Quit date: 10/02/2010  . Smokeless tobacco: Never Used  . 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  General malaise. Eyes: No visual changes. ENT: No sore throat. Cardiovascular: Denies chest pain. Respiratory: Denies shortness of breath. Gastrointestinal: No abdominal pain.  Intermittent N/V.  No diarrhea.  No constipation. Anorexia, decreased PO intake Genitourinary: Negative for dysuria. Musculoskeletal: Negative for back pain. Skin: Negative for rash. Neurological: Negative for headaches, focal weakness or numbness.  Generalized weakness  10-point ROS otherwise negative.  ____________________________________________   PHYSICAL EXAM:  VITAL SIGNS: ED Triage Vitals [08/29/16 1627]  Enc Vitals Group     BP (!) 83/55     Pulse Rate 69     Resp 18     Temp 98.1 F (36.7 C)     Temp Source Oral     SpO2 98 %     Weight 120 lb (54.4 kg)     Height 5\' 1"  (1.549 m)     Head Circumference      Peak Flow      Pain Score  8     Pain Loc      Pain Edu?      Excl. in [place]?     Constitutional: Alert and oriented. NAD but cachetic. Eyes: Conjunctivae are normal. PERRL. EOMI. Head: Atraumatic. Nose: No congestion/rhinnorhea. Mouth/Throat: Mucous membranes are moist.  Oropharynx non-erythematous. Neck: No stridor.  No meningeal signs.   Cardiovascular: Normal rate, regular rhythm. Good peripheral circulation. Grossly normal heart sounds. Respiratory: Normal respiratory effort.  No retractions. Lungs CTAB. Gastrointestinal: Soft and nontender. No distention.  Musculoskeletal: No lower extremity tenderness nor edema. No gross deformities of extremities. Neurologic:  Normal speech and language. No gross focal neurologic deficits are appreciated.  Skin:  Skin is warm, dry and intact. No rash noted. Psychiatric: Mood and affect are normal. Speech and behavior are normal.  ____________________________________________   LABS (all labs ordered are listed, but only abnormal results are displayed)  Labs Reviewed  CBC - Abnormal; Notable for the following:       Result Value   RBC 3.41 (*)    Hemoglobin 10.2 (*)    HCT 30.3 (*)    RDW 22.3 (*)    All other components within normal limits  HEPATIC FUNCTION PANEL - Abnormal; Notable for the following:    AST 386 (*)    ALT 493 (*)    Alkaline Phosphatase 337 (*)    Indirect Bilirubin 0.2 (*)    All other components within normal limits  LIPASE, BLOOD - Abnormal; Notable for the following:    Lipase 223 (*)    All other components within normal limits  BASIC METABOLIC PANEL  URINALYSIS COMPLETEWITH MICROSCOPIC ([facility] ONLY)  CBG MONITORING, ED   ____________________________________________  EKG  ED ECG REPORT I, Halen Antenucci, the attending physician, personally viewed and interpreted this ECG.  Date: 08/29/2016 EKG Time: 16:40 Rate: 58 Rhythm: borderline sinus bradycardia QRS Axis: normal Intervals: normal ST/T Wave abnormalities: inverted T-wave  in lead III Conduction Disturbances: none Narrative Interpretation: Non-specific ST segment / T-wave changes, but no evidence of acute ischemia.  ____________________________________________  RADIOLOGY   No results found.  ____________________________________________   PROCEDURES  Procedure(s) performed:   Procedures   Critical Care performed: No ____________________________________________   INITIAL IMPRESSION / ASSESSMENT AND PLAN / ED COURSE  Pertinent labs & imaging results that were available during my care of the patient were reviewed by me and considered in my medical decision making (see chart for details).  The patient is in no acute distress and in no acute pain.  Her labs are essentially unchanged from prior with a persistent LFT elevation.  The only thing that is significantly different from before is that her lipase is elevated in the 200s at this point.  But she has no active abdominal pain nor tenderness.  I had an extensive conversation with the patient and her nephew who is her healthcare power of attorney.  I explained that I do not think that inpatient admission will be helpful for her because she received a liter of fluids in the emergency department and there is not seemingly an acute issue that can be solved by admission.  He is quite adamant that she cannot care for herself at home and that "you are just sending her home to die."  I explained that she has an oncologist and her primary care doctor and that they need to establish a plan of care but she does not seem to have an admittable diagnosis at this point but they are very  concerned about her ongoing status.   As a result I ask Dr. Doy Hutching the hospitalist to him talk to them and see if he shares my opinion or if he thinks that a brief inpatient stay would be beneficial.  After an extensive discussion with the family he agreed to bring her into the hospital for rehydration and presumably for further  evaluation by the oncologist.  She continues to be in no acute distress at this time.   ____________________________________________  FINAL CLINICAL IMPRESSION(S) / ED DIAGNOSES  Final diagnoses:  Failure to thrive in adult  Acute pancreatitis, unspecified complication status, unspecified pancreatitis type  Malignant neoplasm of pancreas, unspecified location of malignancy ([place])  Volume depletion     MEDICATIONS GIVEN DURING THIS VISIT:  Medications  0.9 %  sodium chloride infusion ( Intravenous New Bag/Given 08/29/16 2105)  morphine 4 MG/ML injection 2 mg (2 mg Intravenous Given 08/29/16 2113)  pantoprazole (PROTONIX) 80 mg in sodium chloride 0.9 % 100 mL IVPB (80 mg Intravenous New Bag/Given 08/29/16 2123)  pantoprazole (PROTONIX) 80 mg in sodium chloride 0.9 % 250 mL (0.32 mg/mL) infusion (not administered)  pantoprazole (PROTONIX) injection 40 mg (not administered)  sodium chloride 0.9 % bolus 1,000 mL (0 mLs Intravenous Stopped 08/29/16 1740)     NEW OUTPATIENT MEDICATIONS STARTED DURING THIS VISIT:  New Prescriptions   No medications on file    Modified Medications   No medications on file    Discontinued Medications   No medications on file     Note:  This document was prepared using Dragon voice recognition software and may include unintentional dictation errors.    Hinda Kehr, MD 08/29/16 2137    Hinda Kehr, MD 08/29/16 2137

## 2016-08-29 NOTE — ED Notes (Signed)
Pt reports to this RN that she has been having dark stools that she has noticed today. Nephew who is in the room reports pt has a hx of bulimia.

## 2016-08-30 DIAGNOSIS — D649 Anemia, unspecified: Secondary | ICD-10-CM

## 2016-08-30 DIAGNOSIS — E869 Volume depletion, unspecified: Secondary | ICD-10-CM

## 2016-08-30 DIAGNOSIS — R413 Other amnesia: Secondary | ICD-10-CM

## 2016-08-30 DIAGNOSIS — C787 Secondary malignant neoplasm of liver and intrahepatic bile duct: Secondary | ICD-10-CM

## 2016-08-30 DIAGNOSIS — Z9884 Bariatric surgery status: Secondary | ICD-10-CM

## 2016-08-30 DIAGNOSIS — C250 Malignant neoplasm of head of pancreas: Secondary | ICD-10-CM

## 2016-08-30 DIAGNOSIS — Z7952 Long term (current) use of systemic steroids: Secondary | ICD-10-CM

## 2016-08-30 DIAGNOSIS — R630 Anorexia: Secondary | ICD-10-CM

## 2016-08-30 DIAGNOSIS — Z79899 Other long term (current) drug therapy: Secondary | ICD-10-CM

## 2016-08-30 DIAGNOSIS — K858 Other acute pancreatitis without necrosis or infection: Principal | ICD-10-CM

## 2016-08-30 DIAGNOSIS — C786 Secondary malignant neoplasm of retroperitoneum and peritoneum: Secondary | ICD-10-CM

## 2016-08-30 DIAGNOSIS — R109 Unspecified abdominal pain: Secondary | ICD-10-CM

## 2016-08-30 DIAGNOSIS — K922 Gastrointestinal hemorrhage, unspecified: Secondary | ICD-10-CM

## 2016-08-30 DIAGNOSIS — R627 Adult failure to thrive: Secondary | ICD-10-CM

## 2016-08-30 DIAGNOSIS — Z66 Do not resuscitate: Secondary | ICD-10-CM

## 2016-08-30 DIAGNOSIS — R195 Other fecal abnormalities: Secondary | ICD-10-CM

## 2016-08-30 DIAGNOSIS — E039 Hypothyroidism, unspecified: Secondary | ICD-10-CM

## 2016-08-30 DIAGNOSIS — R531 Weakness: Secondary | ICD-10-CM

## 2016-08-30 LAB — COMPREHENSIVE METABOLIC PANEL
ALT: 453 U/L — ABNORMAL HIGH (ref 14–54)
AST: 418 U/L — ABNORMAL HIGH (ref 15–41)
Albumin: 3.1 g/dL — ABNORMAL LOW (ref 3.5–5.0)
Alkaline Phosphatase: 359 U/L — ABNORMAL HIGH (ref 38–126)
Anion gap: 5 (ref 5–15)
BUN: 12 mg/dL (ref 6–20)
CHLORIDE: 110 mmol/L (ref 101–111)
CO2: 24 mmol/L (ref 22–32)
Calcium: 8.9 mg/dL (ref 8.9–10.3)
Creatinine, Ser: 0.68 mg/dL (ref 0.44–1.00)
Glucose, Bld: 73 mg/dL (ref 65–99)
POTASSIUM: 3.7 mmol/L (ref 3.5–5.1)
SODIUM: 139 mmol/L (ref 135–145)
Total Bilirubin: 1.8 mg/dL — ABNORMAL HIGH (ref 0.3–1.2)
Total Protein: 6.3 g/dL — ABNORMAL LOW (ref 6.5–8.1)

## 2016-08-30 LAB — URINALYSIS COMPLETE WITH MICROSCOPIC (ARMC ONLY)
BACTERIA UA: NONE SEEN
Bilirubin Urine: NEGATIVE
Glucose, UA: NEGATIVE mg/dL
Hgb urine dipstick: NEGATIVE
Leukocytes, UA: NEGATIVE
Nitrite: NEGATIVE
PH: 6 (ref 5.0–8.0)
PROTEIN: NEGATIVE mg/dL
SQUAMOUS EPITHELIAL / LPF: NONE SEEN
Specific Gravity, Urine: 1.008 (ref 1.005–1.030)

## 2016-08-30 LAB — CBC
HEMATOCRIT: 28.5 % — AB (ref 35.0–47.0)
HEMOGLOBIN: 9.2 g/dL — AB (ref 12.0–16.0)
MCH: 29.1 pg (ref 26.0–34.0)
MCHC: 32.5 g/dL (ref 32.0–36.0)
MCV: 89.7 fL (ref 80.0–100.0)
Platelets: 128 10*3/uL — ABNORMAL LOW (ref 150–440)
RBC: 3.17 MIL/uL — AB (ref 3.80–5.20)
RDW: 22.6 % — ABNORMAL HIGH (ref 11.5–14.5)
WBC: 3 10*3/uL — AB (ref 3.6–11.0)

## 2016-08-30 LAB — LIPASE, BLOOD: Lipase: 536 U/L — ABNORMAL HIGH (ref 11–51)

## 2016-08-30 LAB — OCCULT BLOOD X 1 CARD TO LAB, STOOL: FECAL OCCULT BLD: POSITIVE — AB

## 2016-08-30 MED ORDER — PANTOPRAZOLE SODIUM 40 MG PO TBEC
40.0000 mg | DELAYED_RELEASE_TABLET | Freq: Two times a day (BID) | ORAL | Status: DC
  Administered 2016-08-30 – 2016-08-31 (×2): 40 mg via ORAL
  Filled 2016-08-30 (×2): qty 1

## 2016-08-30 MED ORDER — ENSURE ENLIVE PO LIQD
237.0000 mL | Freq: Two times a day (BID) | ORAL | Status: DC
  Administered 2016-08-30 – 2016-09-02 (×3): 237 mL via ORAL

## 2016-08-30 NOTE — Care Management Important Message (Signed)
Important Message  Patient Details  Name: SAKAYE TEVES MRN: [id number] Date of Birth: 1946/06/16   Medicare Important Message Given:  Yes    Aina Rossbach A, RN 08/30/2016, 4:10 PM

## 2016-08-30 NOTE — Consult Note (Signed)
[facility]  Date of admission:  08/29/2016  Inpatient day:  08/30/2016  Consulting physician:  Dr. Fulton Reek   Reason for Consultation:  Pancreatic cancer  Chief Complaint: Meagan Zavala is a 70 y.o. 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  HPI:  The patient has a history of a gastric bypass surgery 40 years ago. She has subsequently had iron deficiency anemia and has received IV iron in the [place]. She notes black stools on off since 05/2016.  Last colonoscopy and EGD were on 06/10/2016 with Dr. Vira Agar.  EGD revealed a normal esophagus and changes s/p gastric biopsy. Colonoscopy revealed only internal hemorrhoids.  She was seen by Dr. Rogue Bussing on 08/18/2016. At that time she noted abdominal discomfort over the last several months. Appetite  was poor.  She had been seen in the emergency room on 08/17/2016.   Abdominal and pelvic CT scan with contrast revealed a 4.9 x 3.2 x 3.2 cm heterogeneous mass at the pancreatic head compressing the second segment of the duodenum. There were scattered heterogeneous hypodense masses throughout the liver measuring up to 3.2 cm.  Findings were suspicious for pancreatic cancer. CA 19-9 was 180 (0-35).  She was scheduled for PET scan and planned biopsy.  Biopsy is planned for 09/01/2016  PET scan on 08/25/2016 revealed a intensely hypermetabolic mass centered in the pancreatic head and chronic adenocarcinoma. There was multifocal hepatic hypermetabolic metastasis. There was retroperitoneal hypermetabolic metastatic adenopathy adjacent to the celiac trunk and left of the aorta. There was probable postinfectious inflammatory findings in the right upper lobe.  The patient's nephew states that she has been in and out of the emergency room for the same problem (08/17/2016, 08/26/2016, 08/29/2016).  She has become weak and dehydrated.  She has pain. She states that she is given IV fluids and morphine in the emergency room but then goes home for a few days and comes back with the same symptoms.  She denies taking any home pain medications although her friend states that the pain medications were prescribed. She describes nausea, but not having any nausea medications. She states that eating causes her to have pain.  Patient's memory is poor. She lives alone. She is interested in IV fluids at home as she has transportation issues.  She is interested in a pain patch.   Past Medical History:  Diagnosis Date  . Anemia   . Arthritis   . Basal cell carcinoma of face   . BULIMIA many decades of this   patient keeps alive by drinking ensure that she cannot purge  . GERD (gastroesophageal reflux disease)   . Hypercholesteremia   . KIDNEY DISEASE, CHRONIC, STAGE III 2010   Cr 1.3 -> .84, probably from NSAID's and lasix  . LEAKAGE, CONTINUOUS URINE 07/30/2009  . LOW BACK PAIN, CHRONIC    takes tylenol  . Macular degeneration disease    [place], now [place]  . Major depressive disorder, recurrent episode, severe ([place])    well treated with SSRI's  . OSTEOPOROSIS    severe, treated with infusions, followed by endocrine in [place]  . Personal history of tobacco use, presenting hazards to health 08/11/2016  . Wears dentures    full upper and lower    Past Surgical History:  Procedure Laterality Date  . CARPAL TUNNEL RELEASE    . CATARACT EXTRACTION    . COLON SURGERY  1980   partial  colectomy  . COLONOSCOPY WITH PROPOFOL N/A 06/10/2016   Procedure: COLONOSCOPY WITH PROPOFOL;  Surgeon: Manya Silvas, MD;  Location: [facility] ENDOSCOPY;  Service: Endoscopy;  Laterality: N/A;  . ECTROPION REPAIR Bilateral 08/13/2015   Procedure: REPAIR OF ECTROPION;  Surgeon: Karle Starch, MD;  Location: [place];  Service: Ophthalmology;  Laterality: Bilateral;  . ESOPHAGOGASTRODUODENOSCOPY (EGD) WITH PROPOFOL N/A 06/10/2016    Procedure: ESOPHAGOGASTRODUODENOSCOPY (EGD) WITH PROPOFOL;  Surgeon: Manya Silvas, MD;  Location: [facility] ENDOSCOPY;  Service: Endoscopy;  Laterality: N/A;  . 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 PTOSIS REPAIR Bilateral 08/13/2015   Procedure: PTOSIS REPAIR;  Surgeon: Karle Starch, MD;  Location: [place];  Service: Ophthalmology;  Laterality: Bilateral;  . SURGICAL EXCISION OF EXCESSIVE SKIN  1979  . TUBAL LIGATION      Family History  Problem Relation Age of Onset  . Cancer Mother     breast,   . Breast cancer Mother   . Cancer Father     lung  . Cancer Sister     lung, brain  . Cancer Brother     throat    Social History:  reports that she quit smoking about 5 years ago. She has a 60.00 pack-year smoking history. She has never used smokeless tobacco. She reports that she does not drink alcohol or use drugs.  The patient is accompanied by her friend Arbie Cookey. Later, during our conversation, her nephew and medical power of attorney, Caitlyne Ingham, who lives in [place] arrives.  Allergies: No Known Allergies  Medications Prior to Admission  Medication Sig Dispense Refill  . aspirin 81 MG tablet Take 1 tablet (81 mg total) by mouth daily. 30 tablet 11  . BIOTIN PO Take by mouth.    . calcium citrate-vitamin D 200-200 MG-UNIT TABS Take 3 tablets by mouth daily.     . cephALEXin (KEFLEX) 500 MG capsule Take 1 capsule (500 mg total) by mouth 3 (three) times daily. 30 capsule 0  . ciprofloxacin (CIPRO) 250 MG tablet     . clonazePAM (KLONOPIN) 0.5 MG tablet 0.25 mg 2 (two) times daily as needed.   0  . Cyanocobalamin (RA VITAMIN B-12 TR) 1000 MCG TBCR Take 1 tablet by mouth daily.     Marland Kitchen docusate sodium (COLACE) 100 MG capsule Take 100 mg by mouth 2 (two) times daily.    Marland Kitchen donepezil (ARICEPT) 10 MG tablet Take 10 mg by mouth.    . fish oil-omega-3 fatty acids 1000 MG capsule Take 1 capsule by mouth daily.    Marland Kitchen FLUoxetine HCl 60 MG TABS TAKE ONE TABLET  BY MOUTH ONCE DAILY 90 tablet 1  . gabapentin (NEURONTIN) 100 MG capsule Take 100 mg by mouth 4 (four) times daily.    Marland Kitchen HYDROcodone-acetaminophen (NORCO) 10-325 MG tablet Take 1 tablet by mouth every 6 (six) hours as needed. As needed for severe pain 120 tablet 0  . HYDROcodone-acetaminophen (NORCO/VICODIN) 5-325 MG tablet Take 1 tablet by mouth every 6 (six) hours as needed for moderate pain. 20 tablet 0  . meloxicam (MOBIC) 7.5 MG tablet     . metoCLOPramide (REGLAN) 10 MG tablet Take 1 tablet (10 mg total) by mouth every 8 (eight) hours as needed for nausea or vomiting. 30 tablet 1  . mirtazapine (REMERON) 15 MG tablet Take 1 tablet (15 mg total) by mouth at bedtime. 90 tablet 1  . Multiple Vitamins-Minerals (ICAPS PO) Take by mouth.    Marland Kitchen  omeprazole (PRILOSEC) 40 MG capsule TAKE ONE CAPSULE BY MOUTH ONCE DAILY 90 capsule 1  . oxybutynin (DITROPAN XL) 15 MG 24 hr tablet Take 1 tablet (15 mg total) by mouth at bedtime. 90 tablet 2  . pravastatin (PRAVACHOL) 20 MG tablet TAKE ONE TABLET BY MOUTH ONCE DAILY 90 tablet 3  . predniSONE (DELTASONE) 10 MG tablet Take 2 tablets (20 mg total) by mouth daily. 6 tablet 0  . promethazine (PHENERGAN) 12.5 MG tablet Take 1 tablet (12.5 mg total) by mouth every 8 (eight) hours as needed for nausea or vomiting. 20 tablet 0  . promethazine (PHENERGAN) 25 MG tablet Take 1 tablet (25 mg total) by mouth every 6 (six) hours as needed for nausea or vomiting. 20 tablet 0  . SYNTHROID 25 MCG tablet Take 25 mcg by mouth daily before breakfast.     . traMADol (ULTRAM) 50 MG tablet Take 1 tablet (50 mg total) by mouth every 6 (six) hours as needed for severe pain. 15 tablet 0  . traZODone (DESYREL) 50 MG tablet TAKE ONE TO TWO TABLETS BY MOUTH ONCE DAILY 180 tablet 0  . traZODone (DESYREL) 50 MG tablet TAKE ONE TO TWO TABLETS BY MOUTH ONCE DAILY 180 tablet 0  . Vitamin D, Ergocalciferol, (DRISDOL) 50000 UNITS CAPS Take 50,000 Units by mouth every 14 (fourteen) days.    .  vitamin E 400 UNIT capsule Take 400 Units by mouth daily. Reported on 04/07/2016    . zoledronic acid (RECLAST) 5 MG/100ML SOLN Every year.      Review of Systems: GENERAL:  Fatigue.  No fevers or sweats.  No weight loss. PERFORMANCE STATUS (ECOG):  2 HEENT:  No visual changes, runny nose, sore throat, mouth sores or tenderness. Lungs: No shortness of breath or cough.  No hemoptysis. Cardiac:  No chest pain, palpitations, orthopnea, or PND. GI:  Abdominal discomfort.  Minimal appetite.  Nausea and intermittent vomiting.  No diarrhea, constipation, melena or hematochezia. GU:  No urgency, frequency, dysuria, or hematuria. Musculoskeletal:  No back pain.  No joint pain.  No muscle tenderness. Extremities:  No pain or swelling. Skin:  No rashes or skin changes. Neuro:  No headache, numbness or weakness, balance or coordination issues. Endocrine:  No diabetes.  Thyroid disease on Synthroid.  No hot flashes or night sweats. Psych:  No mood changes, depression or anxiety. Pain:  Abdominal discomfort. Review of systems:  All other systems reviewed and found to be negative.  Physical Exam:  Blood pressure (!) 96/44, pulse (!) 59, temperature 98.2 F (36.8 C), temperature source Oral, resp. rate 19, height _0  (1.549 m), weight 123 lb 14.4 oz (56.2 kg), SpO2 95 %.  GENERAL:  Thin woman sitting comfortably on the medical unit in no acute distress. MENTAL STATUS:  Alert and oriented to person, place and time. HEAD:  Short styled auburn/red hair.  Normocephalic, atraumatic, face symmetric, no Cushingoid features. EYES:  Light brown/hazel eyes.  Pupils equal round and reactive to light and accomodation.  No conjunctivitis or scleral icterus. ENT:  Oropharynx clear without lesion.  Tongue normal. Mucous membranes moist.  RESPIRATORY:  Clear to auscultation without rales, wheezes or rhonchi. CARDIOVASCULAR:  Regular rate and rhythm without murmur, rub or gallop. ABDOMEN:  Soft, non-tender, with  active bowel sounds, and no hepatosplenomegaly.  No masses. SKIN:  No rashes, ulcers or lesions. EXTREMITIES: No edema, no skin discoloration or tenderness.  No palpable cords. LYMPH NODES: No palpable cervical, supraclavicular, axillary or inguinal adenopathy  NEUROLOGICAL: Unremarkable. PSYCH:  Appropriate.   Results for orders placed or performed during the hospital encounter of 08/29/16 (from the past 48 hour(s))  Basic metabolic panel     Status: None   Collection Time: 08/29/16  4:29 PM  Result Value Ref Range   Sodium 135 135 - 145 mmol/L   Potassium 4.0 3.5 - 5.1 mmol/L   Chloride 101 101 - 111 mmol/L   CO2 25 22 - 32 mmol/L   Glucose, Bld 99 65 - 99 mg/dL   BUN 17 6 - 20 mg/dL   Creatinine, Ser 0.88 0.44 - 1.00 mg/dL   Calcium 9.5 8.9 - 10.3 mg/dL   GFR calc non Af Amer >60 >60 mL/min   GFR calc Af Amer >60 >60 mL/min    Comment: (NOTE) The eGFR has been calculated using the CKD EPI equation. This calculation has not been validated in all clinical situations. eGFR's persistently <60 mL/min signify possible Chronic Kidney Disease.    Anion gap 9 5 - 15  CBC     Status: Abnormal   Collection Time: 08/29/16  4:29 PM  Result Value Ref Range   WBC 3.6 3.6 - 11.0 K/uL   RBC 3.41 (L) 3.80 - 5.20 MIL/uL   Hemoglobin 10.2 (L) 12.0 - 16.0 g/dL   HCT 30.3 (L) 35.0 - 47.0 %   MCV 89.0 80.0 - 100.0 fL   MCH 30.0 26.0 - 34.0 pg   MCHC 33.7 32.0 - 36.0 g/dL   RDW 22.3 (H) 11.5 - 14.5 %   Platelets 150 150 - 440 K/uL  Hepatic function panel     Status: Abnormal   Collection Time: 08/29/16  4:29 PM  Result Value Ref Range   Total Protein 6.9 6.5 - 8.1 g/dL   Albumin 3.5 3.5 - 5.0 g/dL   AST 386 (H) 15 - 41 U/L   ALT 493 (H) 14 - 54 U/L   Alkaline Phosphatase 337 (H) 38 - 126 U/L   Total Bilirubin 0.5 0.3 - 1.2 mg/dL   Bilirubin, Direct 0.3 0.1 - 0.5 mg/dL   Indirect Bilirubin 0.2 (L) 0.3 - 0.9 mg/dL  Lipase, blood     Status: Abnormal   Collection Time: 08/29/16  4:29 PM   Result Value Ref Range   Lipase 223 (H) 11 - 51 U/L  Comprehensive metabolic panel     Status: Abnormal   Collection Time: 08/30/16  4:37 AM  Result Value Ref Range   Sodium 139 135 - 145 mmol/L   Potassium 3.7 3.5 - 5.1 mmol/L   Chloride 110 101 - 111 mmol/L   CO2 24 22 - 32 mmol/L   Glucose, Bld 73 65 - 99 mg/dL   BUN 12 6 - 20 mg/dL   Creatinine, Ser 0.68 0.44 - 1.00 mg/dL   Calcium 8.9 8.9 - 10.3 mg/dL   Total Protein 6.3 (L) 6.5 - 8.1 g/dL   Albumin 3.1 (L) 3.5 - 5.0 g/dL   AST 418 (H) 15 - 41 U/L   ALT 453 (H) 14 - 54 U/L   Alkaline Phosphatase 359 (H) 38 - 126 U/L   Total Bilirubin 1.8 (H) 0.3 - 1.2 mg/dL   GFR calc non Af Amer >60 >60 mL/min   GFR calc Af Amer >60 >60 mL/min    Comment: (NOTE) The eGFR has been calculated using the CKD EPI equation. This calculation has not been validated in all clinical situations. eGFR's persistently <60 mL/min signify possible Chronic Kidney Disease.  Anion gap 5 5 - 15  CBC     Status: Abnormal   Collection Time: 08/30/16  4:37 AM  Result Value Ref Range   WBC 3.0 (L) 3.6 - 11.0 K/uL   RBC 3.17 (L) 3.80 - 5.20 MIL/uL   Hemoglobin 9.2 (L) 12.0 - 16.0 g/dL   HCT 28.5 (L) 35.0 - 47.0 %   MCV 89.7 80.0 - 100.0 fL   MCH 29.1 26.0 - 34.0 pg   MCHC 32.5 32.0 - 36.0 g/dL   RDW 22.6 (H) 11.5 - 14.5 %   Platelets 128 (L) 150 - 440 K/uL  Lipase, blood     Status: Abnormal   Collection Time: 08/30/16  4:37 AM  Result Value Ref Range   Lipase 536 (H) 11 - 51 U/L    Comment: RESULT CONFIRMED BY MANUAL DILUTION.PMH  Urinalysis complete, with microscopic     Status: Abnormal   Collection Time: 08/30/16  8:19 AM  Result Value Ref Range   Color, Urine YELLOW (A) YELLOW   APPearance CLEAR (A) CLEAR   Glucose, UA NEGATIVE NEGATIVE mg/dL   Bilirubin Urine NEGATIVE NEGATIVE   Ketones, ur TRACE (A) NEGATIVE mg/dL   Specific Gravity, Urine 1.008 1.005 - 1.030   Hgb urine dipstick NEGATIVE NEGATIVE   pH 6.0 5.0 - 8.0   Protein, ur  NEGATIVE NEGATIVE mg/dL   Nitrite NEGATIVE NEGATIVE   Leukocytes, UA NEGATIVE NEGATIVE   RBC / HPF 0-5 0 - 5 RBC/hpf   WBC, UA 0-5 0 - 5 WBC/hpf   Bacteria, UA NONE SEEN NONE SEEN   Squamous Epithelial / LPF NONE SEEN NONE SEEN   Mucous PRESENT    No results found.  Assessment:  The patient is a 70 y.o. woman with probable metastatic pancreatic cancer.   She was admitted with general weakness, abdominal pain, and anemia.  Abdominal and pelvic CT scan with contrast revealed a 4.9 x 3.2 x 3.2 cm heterogeneous mass at the pancreatic head compressing the second segment of the duodenum. There were scattered heterogeneous hypodense masses throughout the liver measuring up to 3.2 cm.  CA 19-9 was 180 (0-35) on 08/18/2016.    PET scan on 08/25/2016 revealed a intensely hypermetabolic mass centered in the pancreatic head and chronic adenocarcinoma. There was multifocal hepatic hypermetabolic metastasis. There was retroperitoneal hypermetabolic metastatic adenopathy adjacent to the celiac trunk and left of the aorta. There was probable postinfectious inflammatory findings in the right upper lobe.  Plan:   1.  Oncology:  Discuss with patient and family likely diagnosis of metastatic pancreatic cancer.  Discuss patient's thoughts's about biopsy for confirmation.  Patient interested in biopsy (tentatively scheduled for 09/01/2016).    Discuss natural history of metastatic pancreatic cancer.  Discuss treatment is palliative. Discussed palliative care/Hopsice versus chemotherapy.  Discuss treatment is mutually exclusive from Hospice support.  She can receive chemotherapy with home health care support.  Discuss single agent gemcitabine versus gemcitabine and Abraxane.  Side effects reviewed in detail.  2.  Fluids/electrolytes/nutrition:  Patient has poor oral intake.  She states that eating causes pain.  She is interested in outpatient IVF.  She has transporatation issues.  We discussed consideration of port  placement for hydration +/- chemotherapy.  3.  Pain and Toxicology:  Patient's memory is poor.  She was unaware of pain medications and nausea medications available at home.  Family is asking about someone helping her with her medications.  We discussed keeping a pain dairy and switching to a  long acting pain medication (? Fentanyl patch).  We discuss discharge to a skilled nursing facility as she is unable to care for herself.  4.  Gastroenterology:  Lucie Leather has a history of gastric bypass surgery and has received IV iron in the past.  EGD and colonoscopy in 05/2016 were negative.  Patient notes dark stools.  Follow HCT daily.  Guaiac all stools.  LFTs and lipase increased.  No biliary obstruction noted on 08/17/2016 scans.  GI consult pending.  5.  Disposition:  Social work consult for placement in SNF.  Palliative care consult (patient would like to know her options).  Code status is DNR.    Thank you for allowing me to participate in SHARENA DIBENEDETTO 's care.  I will follow her closely with you until Dr. Darlin Priestly return on 08/31/2016.   Lequita Asal, MD  08/30/2016, 11:53 AM

## 2016-08-30 NOTE — Progress Notes (Signed)
Advanced Care Plan.  Purpose of Encounter:  CODE STATUS. Parties in Attendance: The patient, her nephew (POA) and me. Patient's Decisional Capacity: Yes. Subjective/Patient Story: Meagan Zavala is a 70 y.o. female has a past medical history significant for pancreatic cancer now with worsening upper abdominal pain associated with "dark stools" and anorexia, back pain, poor oral intake and weakness.  Objective/Medical Story:   The patient has UGI bleed with melena, acute blood loss anemia, acute pancreatitis and pancreatic cancer, likely diagnosis of metastatic pancreatic cancer. Poor oral intake and malnutrition. I discussed with the patient and her nephew about the patient condition and poor prognosis. I discussed about palliative care and hospice care, both the patient and her nephew agree to get palliative care consult and possible hospice care.  Goals of Care Determinations: Palliate care and hospice care  Plan:  Code Status: DO NOT RESUSCITATE  Time spent discussing advance care planning: 17 minutes.

## 2016-08-30 NOTE — Progress Notes (Signed)
Initial Nutrition Assessment  DOCUMENTATION CODES:   Severe malnutrition in context of chronic illness  INTERVENTION:  1. Ensure Enlive po BID, each supplement provides 350 kcal and 20 grams of protein 2. Monitor PO intake, weight, IOs, patient has hx of bulimia, anorexia per Nephew.  NUTRITION DIAGNOSIS:   Malnutrition related to chronic illness as evidenced by severe depletion of muscle mass, severe depletion of body fat.  GOAL:   Patient will meet greater than or equal to 90% of their needs  MONITOR:   PO intake, Supplement acceptance, I & O's, Labs, Weight trends  REASON FOR ASSESSMENT:   Malnutrition Screening Tool    ASSESSMENT:   Meagan Zavala is a 70 y.o. female has a past medical history significant for pancreatic cancer now with worsening upper abdominal pain associated with "dark stools" and anorexia. Very weak. Unable to care for self  Spoke with pt and her nephew at bedside.  Pt appeared to have flat effect as I entered. Per nephew pt has a history of bulimia, vomits solid food within an hour of ingesting it. She will consume Ensure x2 per day per nephew "her only source of nutrition." Patient reports she has "no idea" of weight loss. Could not recall PO intake at home. Denies any issues chewing or swallowing, nausea or vomiting recently.  Nutrition-Focused physical exam completed. Findings are severe fat depletion, severe muscle depletion, and no edema.   Labs and medications reviewed: Tot Bili 1.8 Colace, Remeron, Prednisone, B12 NS @ 165mL/hr  Diet Order:  Diet clear liquid Room service appropriate? Yes; Fluid consistency: Thin  Skin:  Reviewed, no issues  Last BM:  12/2  Height:   Ht Readings from Last 1 Encounters:  08/29/16 5\' 1"  (1.549 m)    Weight:   Wt Readings from Last 1 Encounters:  08/30/16 123 lb 14.4 oz (56.2 kg)    Ideal Body Weight:  47.72 kg  BMI:  Body mass index is 23.41 kg/m.  Estimated Nutritional Needs:    Kcal:  1400-1700 calories  Protein:  55-67 gm  Fluid:  >/= 1.4L  EDUCATION NEEDS:   No education needs identified at this time  Satira Anis. Chevis Weisensel, MS, RD LDN Inpatient Clinical Dietitian Pager [phone]

## 2016-08-30 NOTE — Progress Notes (Signed)
[facility] [place] NAME: Meagan Zavala    MR#:  [id number]  DATE OF BIRTH:  [birth year]  SUBJECTIVE:  CHIEF COMPLAINT:   Chief Complaint  Patient presents with  . Dehydration  . Anorexia   Abdominal pain and back pain, poor oral intake and weakness REVIEW OF SYSTEMS:  Review of Systems  Constitutional: Positive for malaise/fatigue and weight loss. Negative for chills and fever.  HENT: Negative for congestion.   Eyes: Negative for blurred vision and double vision.  Respiratory: Negative for cough, shortness of breath and stridor.   Cardiovascular: Negative for chest pain and leg swelling.  Gastrointestinal: Positive for abdominal pain, melena and nausea. Negative for blood in stool, diarrhea and vomiting.  Genitourinary: Negative for dysuria and hematuria.  Musculoskeletal: Positive for back pain.  Skin: Negative for itching and rash.  Neurological: Positive for weakness. Negative for dizziness, focal weakness and loss of consciousness.  Psychiatric/Behavioral: Negative for depression. The patient is not nervous/anxious.     DRUG ALLERGIES:  No Known Allergies VITALS:  Blood pressure (!) 121/52, pulse 67, temperature 97.5 F (36.4 C), temperature source Oral, resp. rate 20, height 5\' 1"  (1.549 m), weight 123 lb 14.4 oz (56.2 kg), SpO2 99 %. PHYSICAL EXAMINATION:  Physical Exam  Constitutional: She is oriented to person, place, and time and well-developed, well-nourished, and in no distress.  HENT:  Head: Normocephalic.  Eyes: Conjunctivae and EOM are normal. No scleral icterus.  Neck: Normal range of motion. Neck supple. No JVD present. No tracheal deviation present.  Cardiovascular: Normal rate, regular rhythm and normal heart sounds.  Exam reveals no gallop.   No murmur heard. Pulmonary/Chest: Effort normal and breath sounds normal. No respiratory distress. She has no wheezes. She has no rales.  Abdominal: Soft. Bowel sounds  are normal. She exhibits no distension. There is tenderness. There is no rebound and no guarding.  Musculoskeletal: Normal range of motion. She exhibits no edema or tenderness.  Neurological: She is alert and oriented to person, place, and time. No cranial nerve deficit.  Skin: No rash noted. No erythema.  Psychiatric: Affect normal.   LABORATORY PANEL:   CBC  Recent Labs Lab 08/30/16 0437  WBC 3.0*  HGB 9.2*  HCT 28.5*  PLT 128*   ------------------------------------------------------------------------------------------------------------------ Chemistries   Recent Labs Lab 08/30/16 0437  NA 139  K 3.7  CL 110  CO2 24  GLUCOSE 73  BUN 12  CREATININE 0.68  CALCIUM 8.9  AST 418*  ALT 453*  ALKPHOS 359*  BILITOT 1.8*   RADIOLOGY:  No results found. ASSESSMENT AND PLAN:     UGI bleed with melena. Continue protonix, clear liquid diet. IV fluid support, Follow-up hemoglobin and GI.   Acute blood loss anemia. Hemoglobin decreased to 9.2, possible due to GI bleeding and IV fluid dilution. Follow-up hemoglobin.  Acute pancreatitis: Lipase worsening. Follow-up lipase, pain control and IV fluid support.  Pancreatic cancer,  likely diagnosis of metastatic pancreatic cancer. Follow-up biopsy procedure on 09/01/2016. Palliative care consult.  Poor oral intake and malnutrition. Supportive care.  All the records are reviewed and case discussed with ED provider. Management plans discussed with the patient, her nephew (POA) and they are in agreement.  CODE STATUS: DO NOT RESUSCITATE TOTAL TIME TAKING CARE OF THIS PATIENT: 35  minutes.   More than 50% of the time was spent in counseling/coordination of care: YES  POSSIBLE D/C IN 2 DAYS, DEPENDING ON CLINICAL CONDITION.  Demetrios Loll M.D on 08/30/2016 at 1:33 PM  Between 7am to 6pm - Pager - [phone]  After 6pm go to [email or web address] - Proofreader  [facility] [place]  Office   [phone]  CC: Primary care physician; Meagan Mc, MD  Note: This dictation was prepared with Dragon dictation along with smaller phrase technology. Any transcriptional errors that result from this process are unintentional.

## 2016-08-30 NOTE — Clinical Social Work Note (Signed)
CSW received consult for nursing home placement. CSW following pending PT/OT/ALF needs.   Santiago Bumpers, MSW, LCSW-A [phone]

## 2016-08-30 NOTE — Evaluation (Signed)
Physical Therapy Evaluation Patient Details Name: Meagan Zavala MRN: [id number] DOB: 08/06/1946 Today's Date: 08/30/2016   History of Present Illness  Pt is a 70 y.o. female presenting to hospital with abdominal pain with anorexia and dark stool.  Pt admitted with UGI bleed with melena, acute blood loss anemia, and acute pancreatitis.  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  PMH includes bulimia and CTR.  Clinical Impression  Prior to hospital admission, pt was independent with functional mobility without AD.  Pt lives alone in 1 level home with steps to enter.  Currently pt is modified independent with bed mobility, SBA to stand, and CGA with ambulating around nursing loop with RW.  Pt initially mildly unsteady ambulating without AD but gait mechanics and balance improved with education and use of RW.  Pt would benefit from skilled PT to address noted impairments and functional limitations.  Recommend pt discharge to home with support of friends, use of youth sized RW, and HHPT when medically appropriate.    Follow Up Recommendations Home health PT    Equipment Recommendations   (youth sized RW)    Recommendations for Other Services       Precautions / Restrictions Precautions Precautions:  (moderate fall risk) Restrictions Weight Bearing Restrictions: No      Mobility  Bed Mobility Overal bed mobility: Modified Independent             General bed mobility comments: Supine to/from sit with HOB elevated  Transfers Overall transfer level: Needs assistance Equipment used: None Transfers: Sit to/from [employer] Transfers Sit to Stand: Supervision Stand pivot transfers: Min guard;Min assist (transfer to toilet)       General transfer comment: pt using single UE support for balance once standing  Ambulation/Gait Ambulation/Gait assistance: Min guard;Min assist Ambulation Distance (Feet):  (20 feet no AD; 180 feet with  RW) Assistive device: Rolling walker (2 wheeled);None   Gait velocity: decreased no AD; near normal speed with use of RW   General Gait Details: pt with narrow BOS and decreased B step length and mildly unsteady without UE support (pt appearing cautious holding onto items intermittently); steady smooth gait with use of RW  Stairs            Wheelchair Mobility    Modified Rankin (Stroke Patients Only)       Balance Overall balance assessment: Needs assistance Sitting-balance support: No upper extremity supported;Feet supported Sitting balance-Leahy Scale: Normal     Standing balance support: Single extremity supported Standing balance-Leahy Scale: Good                               Pertinent Vitals/Pain Pain Assessment: 0-10 Pain Score: 6  Pain Location: abdominal pain Pain Descriptors / Indicators: Aching Pain Intervention(s): Limited activity within patient's tolerance;Monitored during session;Premedicated before session;Repositioned  Vitals stable and WFL throughout treatment session.     Home Living Family/patient expects to be discharged to:: Private residence Living Arrangements: Alone Available Help at Discharge: Friend(s);Available PRN/intermittently Type of Home: House Home Access: Stairs to enter Entrance Stairs-Rails: Right Entrance Stairs-Number of Steps: 4 Home Layout: One level Home Equipment: None      Prior Function Level of Independence: Independent         Comments: Pt denies any falls in past 6 months.     Hand Dominance        Extremity/Trunk Assessment   Upper Extremity Assessment:  Generalized weakness           Lower Extremity Assessment: Generalized weakness         Communication   Communication: No difficulties  Cognition Arousal/Alertness: Awake/alert Behavior During Therapy: WFL for tasks assessed/performed Overall Cognitive Status: Within Functional Limits for tasks assessed                       General Comments General comments (skin integrity, edema, etc.): Pt's friend present for session.  Nursing cleared pt for participation in physical therapy.  Pt agreeable to PT session.    Exercises  Gait training with RW   Assessment/Plan    PT Assessment Patient needs continued PT services  PT Problem List Decreased balance;Decreased knowledge of use of DME          PT Treatment Interventions DME instruction;Gait training;Stair training;Functional mobility training;Therapeutic activities;Therapeutic exercise;Balance training;Patient/family education    PT Goals (Current goals can be found in the Care Plan section)  Acute Rehab PT Goals Patient Stated Goal: to go home PT Goal Formulation: With patient Time For Goal Achievement: 09/13/16 Potential to Achieve Goals: Good    Frequency Min 2X/week   Barriers to discharge        Co-evaluation               End of Session Equipment Utilized During Treatment: Gait belt Activity Tolerance: Patient tolerated treatment well Patient left: in bed;with call bell/phone within reach;with family/visitor present (pt moderate fall risk score and nursing reports no need for bed alarm) Nurse Communication: Mobility status;Precautions         Time: [id number] PT Time Calculation (min) (ACUTE ONLY): 22 min   Charges:   PT Evaluation $PT Eval Low Complexity: 1 Procedure PT Treatments $Gait Training: 8-22 mins   PT G CodesLeitha Bleak 09/17/16, 3:03 PM Leitha Bleak, [place]

## 2016-08-31 DIAGNOSIS — F419 Anxiety disorder, unspecified: Secondary | ICD-10-CM

## 2016-08-31 DIAGNOSIS — R231 Pallor: Secondary | ICD-10-CM

## 2016-08-31 DIAGNOSIS — R110 Nausea: Secondary | ICD-10-CM

## 2016-08-31 DIAGNOSIS — R64 Cachexia: Secondary | ICD-10-CM

## 2016-08-31 DIAGNOSIS — G893 Neoplasm related pain (acute) (chronic): Secondary | ICD-10-CM

## 2016-08-31 LAB — CBC
HCT: 25.9 % — ABNORMAL LOW (ref 35.0–47.0)
Hemoglobin: 8.5 g/dL — ABNORMAL LOW (ref 12.0–16.0)
MCH: 29.4 pg (ref 26.0–34.0)
MCHC: 33 g/dL (ref 32.0–36.0)
MCV: 89.2 fL (ref 80.0–100.0)
PLATELETS: 123 10*3/uL — AB (ref 150–440)
RBC: 2.91 MIL/uL — ABNORMAL LOW (ref 3.80–5.20)
RDW: 22.3 % — AB (ref 11.5–14.5)
WBC: 4.9 10*3/uL (ref 3.6–11.0)

## 2016-08-31 LAB — LIPASE, BLOOD: LIPASE: 261 U/L — AB (ref 11–51)

## 2016-08-31 MED ORDER — PANTOPRAZOLE SODIUM 40 MG IV SOLR
40.0000 mg | Freq: Two times a day (BID) | INTRAVENOUS | Status: DC
  Administered 2016-08-31 – 2016-09-02 (×5): 40 mg via INTRAVENOUS
  Filled 2016-08-31 (×5): qty 40

## 2016-08-31 NOTE — Progress Notes (Signed)
PT Cancellation Note  Patient Details Name: SAORI YOUN MRN: [id number] DOB: 26-Jun-1946   Cancelled Treatment:    Reason Eval/Treat Not Completed: Pain limiting ability to participate.  Pt reporting 8/10 abdominal pain and declining to participate in PT.  Nursing notified regarding pt's pain levels and request for pain medication.  Will re-attempt PT treatment at a later date/time.   Raquel Sarna Anice Wilshire 08/31/2016, 4:19 PM Leitha Bleak, [place]

## 2016-08-31 NOTE — Care Management (Signed)
I have checked on Meagan Zavala on multiple occassions. Early this morning she did not want to talk. Having received the news she showed no affect and seemed overwhelmed. This afternoon with her friend Hoyle Sauer) bedside she smiled some and thanked me for coming by. I will continue to check up on her and see if her disposition changes and if she desires any services.

## 2016-08-31 NOTE — Progress Notes (Signed)
PT Cancellation Note  Patient Details Name: DAZIA HERMOSO MRN: [id number] DOB: Mar 31, 1946   Cancelled Treatment:    Reason Eval/Treat Not Completed: Patient declined, no reason specified.  Pt initially staring and not responding upon PT entering room but eventually pt started talking a little bit and stated "I can't believe I'm sick" and started to cry.  Pt's friend arrived beginning of session and comforting pt.  Pt refusing all PT activities including walking.  Nursing notified of above and reported she would come check on pt.   Raquel Sarna Damisha Wolff 08/31/2016, 11:58 AM Leitha Bleak, [place]

## 2016-08-31 NOTE — Progress Notes (Signed)
Meagan Zavala   DOB:1946/06/02   [id number]    Subjective: Abdominal pain improved. She is currently getting pain medications. Positive for nausea no vomiting. Patient is anxious.  Objective:  Vitals:   08/31/16 0801 08/31/16 1402  BP: (!) 120/56 119/64  Pulse: (!) 56 61  Resp: 18 16  Temp: 98.4 F (36.9 C) 98 F (36.7 C)     Intake/Output Summary (Last 24 hours) at 08/31/16 1725 Last data filed at 08/31/16 [id number]  Gross per 24 hour  Intake          2231.66 ml  Output                0 ml  Net          2231.66 ml    GENERAL Alert, no distress and comfortable. Cachectic appearing accompanied by a friend. EYES: Positive for pallor or icterus. OROPHARYNX: no thrush or ulceration. NECK: supple, no masses felt LYMPH:  no palpable lymphadenopathy in the cervical, axillary or inguinal regions LUNGS: decreased breath sounds to auscultation at bases and  No wheeze or crackles HEART/CVS: regular rate & rhythm and no murmurs; No lower extremity edema ABDOMEN: abdomen soft, tender  on deep palpation. and normal bowel sounds Musculoskeletal:no cyanosis of digits and no clubbing  PSYCH: alert & oriented x 3 with fluent speech NEURO: no focal motor/sensory deficits SKIN:  no rashes or significant lesions   Labs:  Lab Results  Component Value Date   WBC 4.9 08/31/2016   HGB 8.5 (L) 08/31/2016   HCT 25.9 (L) 08/31/2016   MCV 89.2 08/31/2016   PLT 123 (L) 08/31/2016   NEUTROABS 8.4 (H) 07/30/2016    Lab Results  Component Value Date   NA 139 08/30/2016   K 3.7 08/30/2016   CL 110 08/30/2016   CO2 24 08/30/2016    Studies:  No results found.  Assessment & Plan:   # 70 year-old female patient with suspected pancreatic malignancy- with metastases to the liver is currently admitted to hospital for progressive abdominal pain/elevated LFTs.  # Elevated LFTs- with no significant increase in bilirubin- question biliary obstruction versus progressive liver metastases [less  likely]. Await ultrasound abdomen ordered for tomorrow morning. Discussed with Dr. Vicente Males; it significant dilatation noted-ERCP and stenting would be considered.  # Suspected pancreatic malignancy with metastases to the liver- await biopsy; after above workup is done.  # Discussed with the patient/friend- overall prognosis appears poor; however she is interested in having a biopsy to confirm the malignancy/type of malignancy.   # Abdominal pain- secondary to pancreatic malignancy improving.  Cammie Sickle, MD 08/31/2016  5:25 PM

## 2016-08-31 NOTE — Consult Note (Signed)
Jonathon Bellows MD  [address] [place], [place] [address] Phone: [phone] Fax : [phone]  Consultation  Referring Provider:     No ref. provider found Primary Care Physician:  Crecencio Mc, MD Primary Gastroenterologist:           Reason for Consultation:   GI bleed  Date of Admission:  08/29/2016 Date of Consultation:  08/31/2016         HPI:   Meagan Zavala is a 70 y.o. female with probable pancreatic cancer. Admitted on 08/29/2016 with worsening abdominal pain ,passage of dark stool.She has a history of gastric bypass 40 years back and has been on IV iron by the cancer center. Last EGD in 05/2016 with Dr Tiffany Kocher was normal and colonoscopy too revealed only hemorrhoids. Recent developed abdominal pain and a CT scan revelaed a mass in the pancreatic head and masses in the liver . She is scheduled for a biopsy tomorrow.   On admission she had an elevated lipase of 223 . Hb dropped from 11 to 8.5 today. LFT abnormal with T bilirubin of 1.8. At her bedside washer friend who says the patient has been on and off unresponsive. The patient states that she has had abdominal pain for a few days which she states has resolved presently. She did say she has had black stool for a few days. Denied use of NSAID.   She said she did not want any form of endoscopy now.   Past Medical History:  Diagnosis Date  . Anemia   . Arthritis   . Basal cell carcinoma of face   . BULIMIA many decades of this   patient keeps alive by drinking ensure that she cannot purge  . GERD (gastroesophageal reflux disease)   . Hypercholesteremia   . KIDNEY DISEASE, CHRONIC, STAGE III 2010   Cr 1.3 -> .84, probably from NSAID's and lasix  . LEAKAGE, CONTINUOUS URINE 07/30/2009  . LOW BACK PAIN, CHRONIC    takes tylenol  . Macular degeneration disease    [place], now [place]  . Major depressive disorder, recurrent episode, severe ([place])    well treated with SSRI's  . OSTEOPOROSIS    severe, treated with  infusions, followed by endocrine in [place]  . Personal history of tobacco use, presenting hazards to health 08/11/2016  . Wears dentures    full upper and lower    Past Surgical History:  Procedure Laterality Date  . CARPAL TUNNEL RELEASE    . CATARACT EXTRACTION    . COLON SURGERY  1980   partial colectomy  . COLONOSCOPY WITH PROPOFOL N/A 06/10/2016   Procedure: COLONOSCOPY WITH PROPOFOL;  Surgeon: Manya Silvas, MD;  Location: [facility] ENDOSCOPY;  Service: Endoscopy;  Laterality: N/A;  . ECTROPION REPAIR Bilateral 08/13/2015   Procedure: REPAIR OF ECTROPION;  Surgeon: Karle Starch, MD;  Location: [facility];  Service: Ophthalmology;  Laterality: Bilateral;  . ESOPHAGOGASTRODUODENOSCOPY (EGD) WITH PROPOFOL N/A 06/10/2016   Procedure: ESOPHAGOGASTRODUODENOSCOPY (EGD) WITH PROPOFOL;  Surgeon: Manya Silvas, MD;  Location: [facility] ENDOSCOPY;  Service: Endoscopy;  Laterality: N/A;  . 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 PTOSIS REPAIR Bilateral 08/13/2015   Procedure: PTOSIS REPAIR;  Surgeon: Karle Starch, MD;  Location: [place] [facility];  Service: Ophthalmology;  Laterality: Bilateral;  . SURGICAL EXCISION OF EXCESSIVE SKIN  1979  . TUBAL LIGATION      Prior to Admission medications   Medication Sig Start Date End  Date Taking? Authorizing Provider  aspirin 81 MG tablet Take 1 tablet (81 mg total) by mouth daily. 12/05/13   Crecencio Mc, MD  BIOTIN PO Take by mouth.    Historical Provider, MD  calcium citrate-vitamin D 200-200 MG-UNIT TABS Take 3 tablets by mouth daily.     Historical Provider, MD  cephALEXin (KEFLEX) 500 MG capsule Take 1 capsule (500 mg total) by mouth 3 (three) times daily. 08/17/16   Nance Pear, MD  ciprofloxacin (CIPRO) 250 MG tablet  08/15/16   Historical Provider, MD  clonazePAM (KLONOPIN) 0.5 MG tablet 0.25 mg 2 (two) times daily as needed.  11/18/14   Historical Provider, MD  Cyanocobalamin (RA VITAMIN B-12 TR) 1000 MCG  TBCR Take 1 tablet by mouth daily.     Historical Provider, MD  docusate sodium (COLACE) 100 MG capsule Take 100 mg by mouth 2 (two) times daily.    Historical Provider, MD  donepezil (ARICEPT) 10 MG tablet Take 10 mg by mouth. 05/12/16   Historical Provider, MD  fish oil-omega-3 fatty acids 1000 MG capsule Take 1 capsule by mouth daily.    Historical Provider, MD  FLUoxetine HCl 60 MG TABS TAKE ONE TABLET BY MOUTH ONCE DAILY 05/25/16   Crecencio Mc, MD  gabapentin (NEURONTIN) 100 MG capsule Take 100 mg by mouth 4 (four) times daily.    Historical Provider, MD  HYDROcodone-acetaminophen (NORCO) 10-325 MG tablet Take 1 tablet by mouth every 6 (six) hours as needed. As needed for severe pain 08/25/16   Crecencio Mc, MD  HYDROcodone-acetaminophen (NORCO/VICODIN) 5-325 MG tablet Take 1 tablet by mouth every 6 (six) hours as needed for moderate pain. 08/26/16   Earleen Newport, MD  meloxicam [facility]) 7.5 MG tablet  08/06/16   Historical Provider, MD  metoCLOPramide (REGLAN) 10 MG tablet Take 1 tablet (10 mg total) by mouth every 8 (eight) hours as needed for nausea or vomiting. 08/26/16   Earleen Newport, MD  mirtazapine (REMERON) 15 MG tablet Take 1 tablet (15 mg total) by mouth at bedtime. 11/25/15   Crecencio Mc, MD  Multiple Vitamins-Minerals (ICAPS PO) Take by mouth.    Historical Provider, MD  omeprazole (PRILOSEC) 40 MG capsule TAKE ONE CAPSULE BY MOUTH ONCE DAILY 08/25/16   Crecencio Mc, MD  oxybutynin (DITROPAN XL) 15 MG 24 hr tablet Take 1 tablet (15 mg total) by mouth at bedtime. 04/07/16   Crecencio Mc, MD  pravastatin (PRAVACHOL) 20 MG tablet TAKE ONE TABLET BY MOUTH ONCE DAILY 11/19/15   Crecencio Mc, MD  predniSONE (DELTASONE) 10 MG tablet Take 2 tablets (20 mg total) by mouth daily. 07/30/16   Lorin Picket, PA-C  promethazine (PHENERGAN) 12.5 MG tablet Take 1 tablet (12.5 mg total) by mouth every 8 (eight) hours as needed for nausea or vomiting. 08/25/16   Crecencio Mc,  MD  promethazine (PHENERGAN) 25 MG tablet Take 1 tablet (25 mg total) by mouth every 6 (six) hours as needed for nausea or vomiting. 08/26/16   Earleen Newport, MD  SYNTHROID 25 MCG tablet Take 25 mcg by mouth daily before breakfast.  03/30/16   Historical Provider, MD  traMADol (ULTRAM) 50 MG tablet Take 1 tablet (50 mg total) by mouth every 6 (six) hours as needed for severe pain. 08/17/16 08/17/17  Nance Pear, MD  traZODone (DESYREL) 50 MG tablet TAKE ONE TO TWO TABLETS BY MOUTH ONCE DAILY 06/20/15   Crecencio Mc, MD  traZODone ([place])  50 MG tablet TAKE ONE TO TWO TABLETS BY MOUTH ONCE DAILY 08/25/16   Crecencio Mc, MD  Vitamin D, Ergocalciferol, (DRISDOL) 50000 UNITS CAPS Take 50,000 Units by mouth every 14 (fourteen) days.    Historical Provider, MD  vitamin E 400 UNIT capsule Take 400 Units by mouth daily. Reported on 04/07/2016    Historical Provider, MD  zoledronic acid (RECLAST) 5 MG/100ML SOLN Every year.    Historical Provider, MD    Family History  Problem Relation Age of Onset  . Cancer Mother     breast,   . Breast cancer Mother   . Cancer Father     lung  . Cancer Sister     lung, brain  . Cancer Brother     throat     Social History  Substance Use Topics  . Smoking status: Former Smoker    Packs/day: 1.50    Years: 40.00    Quit date: 10/02/2010  . Smokeless tobacco: Never Used  . 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 (No Known Allergies)    Review of Systems:    All systems reviewed and negative except where noted in HPI.   Physical Exam:  Vital signs in last 24 hours: Temp:  [97.5 F (36.4 C)-98.4 F (36.9 C)] 98.4 F (36.9 C) (12/04 0801) Pulse Rate:  [56-67] 56 (12/04 0801) Resp:  [18-20] 18 (12/04 0801) BP: (90-121)/(52-56) 120/56 (12/04 0801) SpO2:  [96 %-99 %] 96 % (12/04 0801) Weight:  [129 lb 8 oz (58.7 kg)] 129 lb 8 oz (58.7 kg) (12/04 0500) Last BM Date:  08/30/16 General:   Drowsy, looks very lethargic, pale , exhausted , mumbling , very thin  Head:  Normocephalic and atraumatic. Eyes:   No icterus.   Conjunctiva pale . PERRLA. Ears:  Normal auditory acuity. Neck:  Supple; no masses or thyroidomegaly Lungs: Respirations even and unlabored. Lungs clear to auscultation bilaterally.   No wheezes, crackles, or rhonchi.  Heart:  Regular rate and rhythm;  Without murmur, clicks, rubs or gallops Abdomen:  Soft, nondistended, nontender. Normal bowel sounds. No appreciable masses or hepatomegaly.  No rebound or guarding.  Rectal:  Not performed. Psych:  Drowsy , arousable, answers a few questions  LAB RESULTS:  Recent Labs  08/29/16 1629 08/30/16 0437 08/31/16 0429  WBC 3.6 3.0* 4.9  HGB 10.2* 9.2* 8.5*  HCT 30.3* 28.5* 25.9*  PLT 150 128* 123*   BMET  Recent Labs  08/29/16 1629 08/30/16 0437  NA 135 139  K 4.0 3.7  CL 101 110  CO2 25 24  GLUCOSE 99 73  BUN 17 12  CREATININE 0.88 0.68  CALCIUM 9.5 8.9   LFT  Recent Labs  08/29/16 1629 08/30/16 0437  PROT 6.9 6.3*  ALBUMIN 3.5 3.1*  AST 386* 418*  ALT 493* 453*  ALKPHOS 337* 359*  BILITOT 0.5 1.8*  BILIDIR 0.3  --   IBILI 0.2*  --    PT/INR No results for input(s): LABPROT, INR in the last 72 hours.  STUDIES: No results found.    Impression / Plan:   Meagan Zavala is a 70 y.o. y/o female with probable pancreatic cancer with liver metastasis admitted with a possible UGI bleed, melena,She also has acute pancreatitis on admission likely precipitated by the pancreatic mass. She does have elevated LFT's and likely either related to the lesions in  the liver or from biliary obstruction from the pancreatic mass or a combination. She was clear she did not want any endoscopy.   Plan 1. IV PPI 2. Monitor CBC and transfuse, EGD to be discussed again with her if Hb drops further 3. USG RUQ to evaluate size of CBD and need for stenting by ERCP.   Thank you for  involving me in the care of this patient.      LOS: 2 days   Jonathon Bellows, MD  08/31/2016, 10:30 AM

## 2016-08-31 NOTE — Progress Notes (Signed)
[place] [facility] [place] NAME: Meagan Zavala    MR#:  [id number]  DATE OF BIRTH:  [birth year]  SUBJECTIVE:  CHIEF COMPLAINT:   Chief Complaint  Patient presents with  . Dehydration  . Anorexia   Better abdominal pain and back pain, poor oral intake and weakness REVIEW OF SYSTEMS:  Review of Systems  Constitutional: Positive for malaise/fatigue and weight loss. Negative for chills and fever.  HENT: Negative for congestion.   Eyes: Negative for blurred vision and double vision.  Respiratory: Negative for cough, shortness of breath and stridor.   Cardiovascular: Negative for chest pain and leg swelling.  Gastrointestinal: Positive for abdominal pain, melena and nausea. Negative for blood in stool, diarrhea and vomiting.  Genitourinary: Negative for dysuria and hematuria.  Musculoskeletal: Positive for back pain.  Skin: Negative for itching and rash.  Neurological: Positive for weakness. Negative for dizziness, focal weakness and loss of consciousness.  Psychiatric/Behavioral: Negative for depression. The patient is not nervous/anxious.     DRUG ALLERGIES:  No Known Allergies VITALS:  Blood pressure (!) 120/56, pulse (!) 56, temperature 98.4 F (36.9 C), temperature source Oral, resp. rate 18, height 5\' 1"  (1.549 m), weight 129 lb 8 oz (58.7 kg), SpO2 96 %. PHYSICAL EXAMINATION:  Physical Exam  Constitutional: She is oriented to person, place, and time and well-developed, well-nourished, and in no distress.  HENT:  Head: Normocephalic.  Eyes: Conjunctivae and EOM are normal. No scleral icterus.  Neck: Normal range of motion. Neck supple. No JVD present. No tracheal deviation present.  Cardiovascular: Normal rate, regular rhythm and normal heart sounds.  Exam reveals no gallop.   No murmur heard. Pulmonary/Chest: Effort normal and breath sounds normal. No respiratory distress. She has no wheezes. She has no rales.  Abdominal: Soft. Bowel  sounds are normal. She exhibits no distension. There is no tenderness. There is no rebound and no guarding.  Musculoskeletal: Normal range of motion. She exhibits no edema or tenderness.  Neurological: She is alert and oriented to person, place, and time. No cranial nerve deficit.  Skin: No rash noted. No erythema.  Psychiatric: Affect normal.   LABORATORY PANEL:   CBC  Recent Labs Lab 08/31/16 0429  WBC 4.9  HGB 8.5*  HCT 25.9*  PLT 123*   ------------------------------------------------------------------------------------------------------------------ Chemistries   Recent Labs Lab 08/30/16 0437  NA 139  K 3.7  CL 110  CO2 24  GLUCOSE 73  BUN 12  CREATININE 0.68  CALCIUM 8.9  AST 418*  ALT 453*  ALKPHOS 359*  BILITOT 1.8*   RADIOLOGY:  No results found. ASSESSMENT AND PLAN:     UGI bleed with melena. Continue protonix, clear liquid diet. IV fluid support, Follow-up hemoglobin Per Dr. Vicente Males,  1. IV PPI 2. Monitor CBC and transfuse, EGD to be discussed again with her if Hb drops further 3. USG RUQ to evaluate size of CBD and need for stenting by ERCP.     Acute blood loss anemia. Hemoglobin decreased to 8.5,  due to GI bleeding and IV fluid dilution. Follow-up hemoglobin. PRBC transfusions and when necessary.  Acute pancreatitis: Lipase is better, pain control and IV fluid support.   Elevated bilirubin. Possible due to pancreatic cancer. Follow-up in RUQ ultrasound and possible ERCP.  Pancreatic cancer,  likely diagnosis of metastatic pancreatic cancer. Follow-up biopsy procedure on 09/01/2016. Palliative care consult.  Poor oral intake and malnutrition. Supportive care.  All the records are reviewed and case discussed  with ED provider. Management plans discussed with the patient, her nephew (POA) and they are in agreement.  CODE STATUS: DO NOT RESUSCITATE TOTAL TIME TAKING CARE OF THIS PATIENT: 36  minutes.   More than 50% of the time was spent in  counseling/coordination of care: YES  POSSIBLE D/C IN 2 DAYS, DEPENDING ON CLINICAL CONDITION.   Demetrios Loll M.D on 08/31/2016 at 1:58 PM  Between 7am to 6pm - Pager - [phone]  After 6pm go to [email or web address] - Proofreader  [facility]  Office  [phone]  CC: Primary care physician; Crecencio Mc, MD  Note: This dictation was prepared with Dragon dictation along with smaller phrase technology. Any transcriptional errors that result from this process are unintentional.

## 2016-08-31 NOTE — Care Management (Signed)
Admitted to this facility with the diagnosis of pancreatic cancer. Friend is Tomasa Rand [phone]). Larene Beach is [place]. [phone]). Lives alone  Last seen Dr. Derrel Nip a week ago. Prescriptions are filled at [facility] in [place]. No home health. No skilled facility. No home oxygen.  [facility] is in the home. Takes care of all basic activities of daily living herself, drives. No falls. Decreased appetite for a while. Unsure about weight loss. Friend will transport. Shelbie Ammons RN MSN CCM Care Management

## 2016-08-31 NOTE — Progress Notes (Signed)
Dr Bridgett Larsson made aware that pts abd [place] rescheduled for tomorrow morning per [place] dept, related to pt needs to be NPO after midnight

## 2016-09-01 ENCOUNTER — Ambulatory Visit: Payer: PPO

## 2016-09-01 ENCOUNTER — Inpatient Hospital Stay: Payer: PPO

## 2016-09-01 ENCOUNTER — Ambulatory Visit: Payer: PPO | Admitting: Internal Medicine

## 2016-09-01 DIAGNOSIS — K838 Other specified diseases of biliary tract: Secondary | ICD-10-CM

## 2016-09-01 DIAGNOSIS — R7989 Other specified abnormal findings of blood chemistry: Secondary | ICD-10-CM

## 2016-09-01 NOTE — Progress Notes (Signed)
Chaplain visited with pt in room 109. Provided the ministry of prayer and a pastoral presence.    08/31/16 1640  Clinical Encounter Type  Visited With Patient  Visit Type Initial;Spiritual support  Referral From Nurse  Spiritual Encounters  Spiritual Needs Prayer

## 2016-09-01 NOTE — Progress Notes (Signed)
PT Cancellation Note  Patient Details Name: Meagan Zavala MRN: [id number] DOB: 1945/11/18   Cancelled Treatment:    Reason Eval/Treat Not Completed: Fatigue/lethargy limiting ability to participate;Patient declined, no reason specified   Attempted session x 3 this am.  In procedure, too tired, then in with MD.  Will continue as appropriate.   Chesley Noon 09/01/2016, 12:29 PM

## 2016-09-01 NOTE — Progress Notes (Signed)
Thank you for involving the PMT in the care of Ms. Meagan Zavala. This NP attempted to speak with the patient about [place] and she was very drowsy and politely told me she was not interested in talking but ok with me calling her nephew, Meagan Zavala. Larene Beach responded to my voicemail on team phone but I was unable to reach him again this afternoon. Will attempt to contact family again in AM.   [place], [facility] Team  Phone: [phone] Fax: [phone]

## 2016-09-01 NOTE — Progress Notes (Signed)
Physical Therapy Treatment Patient Details Name: Meagan Zavala MRN: [id number] DOB: 1945/12/21 Today's Date: 09/01/2016    History of Present Illness Pt is a 69 y.o. female presenting to hospital with abdominal pain with anorexia and dark stool.  Pt admitted with UGI bleed with melena, acute blood loss anemia, and acute pancreatitis.  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  PMH includes bulimia and CTR.    PT Comments    Pt agreeable to PT. Reports constant abdominal pain that is tolerable. Pt manages mobility in/out of bed with Mod I. Requires safety cues for hand placement with transfer, but can manage to sit/stand without use of upper extremities. Pt progressing ambulation distance; quality subjectively feels to baseline other than using rolling walker. No loss of balance; pt does feel she needs rolling walker currently to feel steady. Pt takes 1 seated rest break during >400 ft walk before stair climbing. Pt manages steps with 1 handrail with more control/safety with step to technique rather than reciprocal and advised to use step to technique. Pt understands and notes that she is often in a hurry. Education provided for habit change for improved safety/fall prevention. Pt manages toiletting tasks and hygiene with supervision and set up only. Pt wishes back to bed post session, as she finds recliner uncomfortable. Pt progressing nicely; continue to progress quality and overall safety of all functional mobility and progress strength for improved balance and ambulation quality.   Follow Up Recommendations  Home health PT     Equipment Recommendations       Recommendations for Other Services       Precautions / Restrictions Restrictions Weight Bearing Restrictions: No    Mobility  Bed Mobility Overal bed mobility: Modified Independent             General bed mobility comments: Able to sit/stand without use of hands at times from multiple  surfaces  Transfers Overall transfer level: Modified independent Equipment used: Rolling walker (2 wheeled) Transfers: Sit to/from Stand Sit to Stand: Supervision         General transfer comment: Able to transfer without use of hands, but encouraged use of hands for improved safety  Ambulation/Gait Ambulation/Gait assistance: Supervision Ambulation Distance (Feet): 425 Feet Assistive device: Rolling walker (2 wheeled);None Gait Pattern/deviations: Step-through pattern (at baseline except using rw (usually does not need))   Gait velocity interpretation: Below normal speed for age/gender General Gait Details: Pt feels at baseline except feels need for rw currently. Pt with good pace/fluidity. Does require some cues intermittently for safety issues with chair approach. 1 seated rest during ambulation before stairs   Stairs Stairs: Yes Stairs assistance: Min guard Stair Management: One rail Left Number of Stairs: 4 General stair comments: Tendency to attempt reciprocal with increased need for upper extremity pull to ascend and less LE stability descending. Encouraged step to pattern with improved safety/stability  Wheelchair Mobility    Modified Rankin (Stroke Patients Only)       Balance Overall balance assessment: Needs assistance   Sitting balance-Leahy Scale: Good     Standing balance support: Bilateral upper extremity supported;No upper extremity supported Standing balance-Leahy Scale: Good (fair without UE support) Standing balance comment: manages hygiene post toiletting in stand                    Cognition Arousal/Alertness: Awake/alert Behavior During Therapy: WFL for tasks assessed/performed Overall Cognitive Status: Within Functional Limits for tasks assessed  Exercises      General Comments        Pertinent Vitals/Pain Pain Assessment: Faces Faces Pain Scale: Hurts little more Pain Location: Abdomen Pain  Descriptors / Indicators: Constant Pain Intervention(s): Monitored during session    Home Living                      Prior Function            PT Goals (current goals can now be found in the care plan section) Progress towards PT goals: Progressing toward goals    Frequency    Min 2X/week      PT Plan Current plan remains appropriate    Co-evaluation             End of Session Equipment Utilized During Treatment: Gait belt Activity Tolerance: Patient tolerated treatment well Patient left: in bed;with call bell/phone within reach;with bed alarm set;with family/visitor present     Time: 1351-1436 PT Time Calculation (min) (ACUTE ONLY): 45 min  Charges:  $Gait Training: 23-37 mins $Therapeutic Activity: 8-22 mins                    G CodesLarae Grooms, PTA 09/01/2016, 2:49 PM

## 2016-09-01 NOTE — Progress Notes (Signed)
[place] made an initial visit with Pt. (Referred by [facility])  Pt was resting with the lights off in her room. Pt indicated that she did not need spiritual care at this time. [place] prayed a silent prayer at the door. [facility] is available for follow up as needed.    09/01/16 1400  Clinical Encounter Type  Visited With Patient  Visit Type Initial;Spiritual support  Referral From [place]

## 2016-09-01 NOTE — Progress Notes (Signed)
Jonathon Bellows MD [address], [place], [place] [address] Phone: [phone] Fax : [phone]  Meagan Zavala is being followed for GI bleed, pancreatitis  Day 2 of follow up   Subjective: Feels hubgry, wants to eat , denies any melena.   Objective: Vital signs in last 24 hours: Vitals:   08/31/16 0801 08/31/16 1402 08/31/16 2024 09/01/16 0459  BP: (!) 120/56 119/64 (!) 123/58 (!) 124/58  Pulse: (!) 56 61 (!) 57 66  Resp: 18 16 16    Temp: 98.4 F (36.9 C) 98 F (36.7 C) 98.1 F (36.7 C) 98.1 F (36.7 C)  TempSrc: Oral Oral Oral Oral  SpO2: 96% 96% 98% 96%  Weight:      Height:       Weight change:   Intake/Output Summary (Last 24 hours) at 09/01/16 1229 Last data filed at 09/01/16 1130  Gross per 24 hour  Intake             2600 ml  Output              200 ml  Net             2400 ml     Exam: Heart:: Regular rate and rhythm Lungs: normal Abdomen: soft, nontender, normal bowel sounds   Lab Results: CBC Latest Ref Rng & Units 08/31/2016 08/30/2016 08/29/2016  WBC 3.6 - 11.0 K/uL 4.9 3.0(L) 3.6  Hemoglobin 12.0 - 16.0 g/dL 8.5(L) 9.2(L) 10.2(L)  Hematocrit 35.0 - 47.0 % 25.9(L) 28.5(L) 30.3(L)  Platelets 150 - 440 K/uL 123(L) 128(L) 150   Micro Results: No results found for this or any previous visit (from the past 240 hour(s)). Studies/Results: US Abdomen Limited Ruq  Result Date: 09/01/2016 CLINICAL DATA:  Abnormal LFTs. EXAM: US ABDOMEN LIMITED - RIGHT UPPER QUADRANT COMPARISON:  PET-CT 08/25/2016. FINDINGS: Gallbladder: Cholecystectomy. Common bile duct: Diameter: 12.9 mm. Common bile duct is dilated. Mild intrahepatic biliary ductal dilatation . Liver: Multiple hepatic lesions again noted. Largest in the right lobe is 2.8 cm in maximum diameter. Findings are consistent with metastatic disease . IMPRESSION: 1. Dilated common bile duct at 12.9 mm. Mild intrahepatic biliary ductal dilatation. Prior cholecystectomy. 2. Multiple hepatic lesions  consistent with metastatic disease again noted. Largest lesion is in the right lobe measures 2.8 cm in maximum diameter. Electronically Signed   By: Marcello Moores  Register   On: 09/01/2016 09:03   Medications: I have reviewed the patient's current medications. Scheduled Meds: . docusate sodium  100 mg Oral BID  . donepezil  10 mg Oral QHS  . feeding supplement (ENSURE ENLIVE)  237 mL Oral BID BM  . FLUoxetine  60 mg Oral Daily  . gabapentin  100 mg Oral QID  . levothyroxine  25 mcg Oral QAC breakfast  . mirtazapine  15 mg Oral QHS  . pantoprazole (PROTONIX) IV  40 mg Intravenous Q12H  . predniSONE  20 mg Oral Daily  . traZODone  50 mg Oral QHS  . vitamin B-12  1,000 mcg Oral Daily   Continuous Infusions: . sodium chloride 100 mL/hr at 09/01/16 0750   PRN Meds:.acetaminophen **OR** acetaminophen, bisacodyl, clonazePAM, HYDROcodone-acetaminophen, metoCLOPramide, morphine injection, ondansetron **OR** ondansetron (ZOFRAN) IV   Assessment: Principal Problem:   Pancreatic cancer ([place]) Active Problems:   UGI bleed   Acute pancreatitis   Acute blood loss anemia   Malignant neoplasm of pancreas ([place])   Failure to thrive in adult   Volume depletion Meagan Zavala is a 70 y.o.  y/o female with probable pancreatic cancer with liver metastasis admitted with a possible UGI bleed, melena,She also has acute pancreatitis on admission likely precipitated by the pancreatic mass. She does have elevated LFT's and likely either related to the lesions in the liver or from biliary obstruction from the pancreatic mass or a combination. She was clear she did not want any endoscopy yesterday but today she says she is willing to undergo it tomorrow. We obtained an USG ofher abdomen and it showed a dilated CBD 12.6 mm , since she has a gastric bypass, we wouldn't be able to place a CBD stent here. She will need to be sent to [facility] for single balloon enteroscopy with ERCP   Plan: 1. EGD tomorrow morning  2. ERCP  with single balloon enteroscopy at [facility] after EGD   LOS: 3 days   Jonathon Bellows 09/01/2016, 12:29 PM

## 2016-09-01 NOTE — Progress Notes (Addendum)
[facility] [place] NAME: Shareeka Cernosek    MR#:  [id number]  DATE OF BIRTH:  1946/01/24  SUBJECTIVE:  CHIEF COMPLAINT:   Chief Complaint  Patient presents with  . Dehydration  . Anorexia   Better abdominal pain and back pain, poor oral intake and weakness REVIEW OF SYSTEMS:  Review of Systems  Constitutional: Positive for malaise/fatigue and weight loss. Negative for chills and fever.  HENT: Negative for congestion.   Eyes: Negative for blurred vision and double vision.  Respiratory: Negative for cough, shortness of breath and stridor.   Cardiovascular: Negative for chest pain and leg swelling.  Gastrointestinal: Positive for abdominal pain, melena and nausea. Negative for blood in stool, diarrhea and vomiting.  Genitourinary: Negative for dysuria and hematuria.  Musculoskeletal: Positive for back pain.  Skin: Negative for itching and rash.  Neurological: Positive for weakness. Negative for dizziness, focal weakness and loss of consciousness.  Psychiatric/Behavioral: Negative for depression. The patient is not nervous/anxious.     DRUG ALLERGIES:  No Known Allergies VITALS:  Blood pressure 122/65, pulse 66, temperature 98.9 F (37.2 C), temperature source Oral, resp. rate 20, height 5\' 1"  (1.549 m), weight 129 lb 8 oz (58.7 kg), SpO2 97 %. PHYSICAL EXAMINATION:  Physical Exam  Constitutional: She is oriented to person, place, and time and well-developed, well-nourished, and in no distress.  HENT:  Head: Normocephalic.  Eyes: Conjunctivae and EOM are normal. No scleral icterus.  Neck: Normal range of motion. Neck supple. No JVD present. No tracheal deviation present.  Cardiovascular: Normal rate, regular rhythm and normal heart sounds.  Exam reveals no gallop.   No murmur heard. Pulmonary/Chest: Effort normal and breath sounds normal. No respiratory distress. She has no wheezes. She has no rales.  Abdominal: Soft. Bowel sounds  are normal. She exhibits no distension. There is no tenderness. There is no rebound and no guarding.  Musculoskeletal: Normal range of motion. She exhibits no edema or tenderness.  Neurological: She is alert and oriented to person, place, and time. No cranial nerve deficit.  Skin: No rash noted. No erythema.  Psychiatric: Affect normal.   LABORATORY PANEL:   CBC  Recent Labs Lab 08/31/16 0429  WBC 4.9  HGB 8.5*  HCT 25.9*  PLT 123*   ------------------------------------------------------------------------------------------------------------------ Chemistries   Recent Labs Lab 08/30/16 0437  NA 139  K 3.7  CL 110  CO2 24  GLUCOSE 73  BUN 12  CREATININE 0.68  CALCIUM 8.9  AST 418*  ALT 453*  ALKPHOS 359*  BILITOT 1.8*   RADIOLOGY:  US Abdomen Limited Ruq  Result Date: 09/01/2016 CLINICAL DATA:  Abnormal LFTs. EXAM: US ABDOMEN LIMITED - RIGHT UPPER QUADRANT COMPARISON:  PET-CT 08/25/2016. FINDINGS: Gallbladder: Cholecystectomy. Common bile duct: Diameter: 12.9 mm. Common bile duct is dilated. Mild intrahepatic biliary ductal dilatation . Liver: Multiple hepatic lesions again noted. Largest in the right lobe is 2.8 cm in maximum diameter. Findings are consistent with metastatic disease . IMPRESSION: 1. Dilated common bile duct at 12.9 mm. Mild intrahepatic biliary ductal dilatation. Prior cholecystectomy. 2. Multiple hepatic lesions consistent with metastatic disease again noted. Largest lesion is in the right lobe measures 2.8 cm in maximum diameter. Electronically Signed   By: Marcello Moores  Register   On: 09/01/2016 09:03   ASSESSMENT AND PLAN:     UGI bleed with melena. Stool occult is positive. Continue protonix, clear liquid diet. IV fluid support, EGD tomorrow Per Dr. Vicente Males,   USG  RUQ Dilated common bile duct at 12.9 mm. Multiple hepatic lesions consistent with metastatic disease again noted. Largest lesion is in the right lobe measures 2.8 cm in maximum diameter. The  Patient wants ERCP per Dr. Vicente Males.    Acute blood loss anemia. Hemoglobin decreased to 8.5,  due to GI bleeding and IV fluid dilution. Follow-up hemoglobin. PRBC transfusions and when necessary.  Acute pancreatitis: Lipase is better, pain control and IV fluid support.   Elevated bilirubin. Possible due to pancreatic cancer. Follow-up Dr. Vicente Males.  Pancreatic cancer,  likely diagnosis of metastatic pancreatic cancer. Follow-up biopsy procedure on 09/01/2016. Palliative care consult.  Poor oral intake and malnutrition. Supportive care.  Discussed with Dr. Vicente Males. All the records are reviewed and case discussed with ED provider. Management plans discussed with the patient, her nephew (POA) and they are in agreement.  CODE STATUS: DO NOT RESUSCITATE TOTAL TIME TAKING CARE OF THIS PATIENT: 38  minutes.   More than 50% of the time was spent in counseling/coordination of care: YES  POSSIBLE D/C IN 2 DAYS, DEPENDING ON CLINICAL CONDITION.   Demetrios Loll M.D on 09/01/2016 at 6:20 PM  Between 7am to 6pm - Pager - [phone]  After 6pm go to [email or web address] - Proofreader  [facility]  Office  [phone]  CC: Primary care physician; Crecencio Mc, MD  Note: This dictation was prepared with Dragon dictation along with smaller phrase technology. Any transcriptional errors that result from this process are unintentional.

## 2016-09-01 NOTE — Progress Notes (Signed)
Dr. Vicente Males with GI notified Abdominal US completed this morning. Patient with continued c/o of abdominal pain relieved by Morphine PRN. No sign of bleeding noted this morning. Dr. Vicente Males will be rounding on patient shortly to discuss plan of care. Verbal order for clear liquids received.

## 2016-09-02 ENCOUNTER — Inpatient Hospital Stay: Payer: PPO | Admitting: Anesthesiology

## 2016-09-02 ENCOUNTER — Encounter: Payer: Self-pay | Admitting: Anesthesiology

## 2016-09-02 ENCOUNTER — Encounter
Admission: EM | Disposition: A | Discharge status: Home Health | Payer: Self-pay | Source: Home / Self Care | Attending: Internal Medicine

## 2016-09-02 DIAGNOSIS — D62 Acute posthemorrhagic anemia: Secondary | ICD-10-CM

## 2016-09-02 HISTORY — PX: ESOPHAGOGASTRODUODENOSCOPY: SHX5428

## 2016-09-02 LAB — HEMOGLOBIN: HEMOGLOBIN: 10 g/dL — AB (ref 12.0–16.0)

## 2016-09-02 SURGERY — EGD (ESOPHAGOGASTRODUODENOSCOPY)
Anesthesia: General

## 2016-09-02 MED ORDER — PROPOFOL 10 MG/ML IV BOLUS
INTRAVENOUS | Status: DC | PRN
  Administered 2016-09-02: 50 mg via INTRAVENOUS

## 2016-09-02 MED ORDER — PROPOFOL 500 MG/50ML IV EMUL
INTRAVENOUS | Status: DC | PRN
  Administered 2016-09-02: 100 ug/kg/min via INTRAVENOUS

## 2016-09-02 MED ORDER — HYDROCODONE-ACETAMINOPHEN 10-325 MG PO TABS: 1.0000 | ORAL_TABLET | Freq: Four times a day (QID) | ORAL | 0 refills | Status: DC | PRN

## 2016-09-02 MED ORDER — LIDOCAINE HCL (CARDIAC) 20 MG/ML IV SOLN
INTRAVENOUS | Status: DC | PRN
  Administered 2016-09-02: 40 mg via INTRAVENOUS

## 2016-09-02 MED ORDER — MIDAZOLAM HCL 2 MG/2ML IJ SOLN: INTRAMUSCULAR | Status: DC | PRN

## 2016-09-02 NOTE — Care Management (Signed)
Physical therapy recommending home with home health/ physical therapy. Discussed agencies with Ms. Cashion at the bedside. [place]. Floydene Flock, [facility] representative updated. Discharge to home today per Dr. Haydee Monica RN MSN CCM care Management

## 2016-09-02 NOTE — OR Nursing (Signed)
Report called to Charlynn Court, RN.  Exam normal.  Transferred back to 109 via orderly.

## 2016-09-02 NOTE — H&P (Signed)
Jonathon Bellows MD [address] [place], [place] [address] Phone: [phone] Fax : [phone]  Primary Care Physician:  Crecencio Mc, MD Primary Gastroenterologist:  Dr. Jonathon Bellows   Pre-Procedure History & Physical: HPI:  Meagan Zavala is a 70 y.o. female is here for an endoscopy.   Past Medical History:  Diagnosis Date  . Anemia   . Arthritis   . Basal cell carcinoma of face   . BULIMIA many decades of this   patient keeps alive by drinking ensure that she cannot purge  . GERD (gastroesophageal reflux disease)   . Hypercholesteremia   . KIDNEY DISEASE, CHRONIC, STAGE III 2010   Cr 1.3 -> .84, probably from NSAID's and lasix  . LEAKAGE, CONTINUOUS URINE 07/30/2009  . LOW BACK PAIN, CHRONIC    takes tylenol  . Macular degeneration disease    Dingledein, now Appenzeller  . Major depressive disorder, recurrent episode, severe ([place])    well treated with SSRI's  . OSTEOPOROSIS    severe, treated with infusions, followed by endocrine in [place]  . Personal history of tobacco use, presenting hazards to health 08/11/2016  . Wears dentures    full upper and lower    Past Surgical History:  Procedure Laterality Date  . CARPAL TUNNEL RELEASE    . CATARACT EXTRACTION    . COLON SURGERY  1980   partial colectomy  . COLONOSCOPY WITH PROPOFOL N/A 06/10/2016   Procedure: COLONOSCOPY WITH PROPOFOL;  Surgeon: Manya Silvas, MD;  Location: [facility] ENDOSCOPY;  Service: Endoscopy;  Laterality: N/A;  . ECTROPION REPAIR Bilateral 08/13/2015   Procedure: REPAIR OF ECTROPION;  Surgeon: Karle Starch, MD;  Location: [facility];  Service: Ophthalmology;  Laterality: Bilateral;  . ESOPHAGOGASTRODUODENOSCOPY (EGD) WITH PROPOFOL N/A 06/10/2016   Procedure: ESOPHAGOGASTRODUODENOSCOPY (EGD) WITH PROPOFOL;  Surgeon: Manya Silvas, MD;  Location: [facility] ENDOSCOPY;  Service: Endoscopy;  Laterality: N/A;  . 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 PTOSIS  REPAIR Bilateral 08/13/2015   Procedure: PTOSIS REPAIR;  Surgeon: Karle Starch, MD;  Location: [place];  Service: Ophthalmology;  Laterality: Bilateral;  . SURGICAL EXCISION OF EXCESSIVE SKIN  1979  . TUBAL LIGATION      Prior to Admission medications   Medication Sig Start Date End Date Taking? Authorizing Provider  aspirin 81 MG tablet Take 1 tablet (81 mg total) by mouth daily. 12/05/13   Crecencio Mc, MD  BIOTIN PO Take by mouth.    Historical Provider, MD  calcium citrate-vitamin D 200-200 MG-UNIT TABS Take 3 tablets by mouth daily.     Historical Provider, MD  cephALEXin (KEFLEX) 500 MG capsule Take 1 capsule (500 mg total) by mouth 3 (three) times daily. 08/17/16   Nance Pear, MD  ciprofloxacin (CIPRO) 250 MG tablet  08/15/16   Historical Provider, MD  clonazePAM (KLONOPIN) 0.5 MG tablet 0.25 mg 2 (two) times daily as needed.  11/18/14   Historical Provider, MD  Cyanocobalamin (RA VITAMIN B-12 TR) 1000 MCG TBCR Take 1 tablet by mouth daily.     Historical Provider, MD  docusate sodium (COLACE) 100 MG capsule Take 100 mg by mouth 2 (two) times daily.    Historical Provider, MD  donepezil (ARICEPT) 10 MG tablet Take 10 mg by mouth. 05/12/16   Historical Provider, MD  fish oil-omega-3 fatty acids 1000 MG capsule Take 1 capsule by mouth daily.    Historical Provider, MD  FLUoxetine HCl  60 MG TABS TAKE ONE TABLET BY MOUTH ONCE DAILY 05/25/16   Crecencio Mc, MD  gabapentin (NEURONTIN) 100 MG capsule Take 100 mg by mouth 4 (four) times daily.    Historical Provider, MD  HYDROcodone-acetaminophen (NORCO) 10-325 MG tablet Take 1 tablet by mouth every 6 (six) hours as needed. As needed for severe pain 08/25/16   Crecencio Mc, MD  HYDROcodone-acetaminophen (NORCO/VICODIN) 5-325 MG tablet Take 1 tablet by mouth every 6 (six) hours as needed for moderate pain. 08/26/16   Earleen Newport, MD  meloxicam [facility]) 7.5 MG tablet  08/06/16   Historical Provider, MD  metoCLOPramide  (REGLAN) 10 MG tablet Take 1 tablet (10 mg total) by mouth every 8 (eight) hours as needed for nausea or vomiting. 08/26/16   Earleen Newport, MD  mirtazapine (REMERON) 15 MG tablet Take 1 tablet (15 mg total) by mouth at bedtime. 11/25/15   Crecencio Mc, MD  Multiple Vitamins-Minerals (ICAPS PO) Take by mouth.    Historical Provider, MD  omeprazole (PRILOSEC) 40 MG capsule TAKE ONE CAPSULE BY MOUTH ONCE DAILY 08/25/16   Crecencio Mc, MD  oxybutynin (DITROPAN XL) 15 MG 24 hr tablet Take 1 tablet (15 mg total) by mouth at bedtime. 04/07/16   Crecencio Mc, MD  pravastatin (PRAVACHOL) 20 MG tablet TAKE ONE TABLET BY MOUTH ONCE DAILY 11/19/15   Crecencio Mc, MD  predniSONE (DELTASONE) 10 MG tablet Take 2 tablets (20 mg total) by mouth daily. 07/30/16   Lorin Picket, PA-C  promethazine (PHENERGAN) 12.5 MG tablet Take 1 tablet (12.5 mg total) by mouth every 8 (eight) hours as needed for nausea or vomiting. 08/25/16   Crecencio Mc, MD  promethazine (PHENERGAN) 25 MG tablet Take 1 tablet (25 mg total) by mouth every 6 (six) hours as needed for nausea or vomiting. 08/26/16   Earleen Newport, MD  SYNTHROID 25 MCG tablet Take 25 mcg by mouth daily before breakfast.  03/30/16   Historical Provider, MD  traMADol (ULTRAM) 50 MG tablet Take 1 tablet (50 mg total) by mouth every 6 (six) hours as needed for severe pain. 08/17/16 08/17/17  Nance Pear, MD  traZODone (DESYREL) 50 MG tablet TAKE ONE TO TWO TABLETS BY MOUTH ONCE DAILY 06/20/15   Crecencio Mc, MD  traZODone (DESYREL) 50 MG tablet TAKE ONE TO TWO TABLETS BY MOUTH ONCE DAILY 08/25/16   Crecencio Mc, MD  Vitamin D, Ergocalciferol, (DRISDOL) 50000 UNITS CAPS Take 50,000 Units by mouth every 14 (fourteen) days.    Historical Provider, MD  vitamin E 400 UNIT capsule Take 400 Units by mouth daily. Reported on 04/07/2016    Historical Provider, MD  zoledronic acid (RECLAST) 5 MG/100ML SOLN Every year.    Historical Provider, MD     Allergies as of 08/29/2016  . (No Known Allergies)    Family History  Problem Relation Age of Onset  . Cancer Mother     breast,   . Breast cancer Mother   . Cancer Father     lung  . Cancer Sister     lung, brain  . Cancer Brother     throat    Social History   Social History  . Marital status: Single    Spouse name: N/A  . Number of children: N/A  . Years of education: N/A   Occupational History  . Not on file.   Social History Main Topics  . Smoking status: Former Smoker  Packs/day: 1.50    Years: 40.00    Quit date: 10/02/2010  . Smokeless tobacco: Never Used  . 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 Drug use: No  . Sexual activity: Not on file   Other Topics Concern  . Not on file   Social History Narrative   Patient has been living alone. She is on disability. She goes to church and has support there. She has a very supportive cousin, Hulda Gausman: [phone].    Review of Systems: See HPI, otherwise negative ROS  Physical Exam: BP (!) 133/59   Pulse 65   Temp 99 F (37.2 C) (Tympanic)   Resp 17   Ht 5\' 1"  (1.549 m)   Wt 129 lb 8 oz (58.7 kg)   SpO2 99%   BMI 24.47 kg/m  General:   Alert,  pleasant and cooperative in NAD Head:  Normocephalic and atraumatic. Neck:  Supple; no masses or thyromegaly. Lungs:  Clear throughout to auscultation.    Heart:  Regular rate and rhythm. Abdomen:  Soft, nontender and nondistended. Normal bowel sounds, without guarding, and without rebound.   Neurologic:  Alert and  oriented x4;  grossly normal neurologically.  Impression/Plan: Meagan Zavala is here for an endoscopy to be performed for gi bleed  Risks, benefits, limitations, and alternatives regarding  endoscopy have been reviewed with the patient.  Questions have been answered.  All parties agreeable.   Jonathon Bellows, MD  09/02/2016, 11:20 AM

## 2016-09-02 NOTE — Progress Notes (Signed)
Spoke to gastroenterologist Dr.Anna; post endoscopy; patient declined evaluation at [facility] for stent placement/ERCP. Patient was discharged. Discussed the need for ERCP/biopsy. Eager to go home/in the car. Marland Kitchen Unfortunately patient is a poor prognosis. She'll follow-up with me as outpatient. Also discussed with Dr.Anna.

## 2016-09-02 NOTE — Plan of Care (Addendum)
Patient going down for Endoscopy. Morning meds given with sip of water, but NPO since midnight.  Consent signed. Report given to Norlene Campbell, RN.  RN assessment and VS revealed stability for transfer. Handoff completed.

## 2016-09-02 NOTE — Discharge Summary (Signed)
[facility] [place] NAME: Meagan Zavala    MR#:  [id number]  DATE OF BIRTH:  07-07-1946  DATE OF ADMISSION:  08/29/2016   ADMITTING PHYSICIAN: Idelle Crouch, MD  DATE OF DISCHARGE: 09/02/2016  4:35 PM  PRIMARY CARE PHYSICIAN: Crecencio Mc, MD   ADMISSION DIAGNOSIS:  Failure to thrive in adult [R62.7] Volume depletion [E86.9] Malignant neoplasm of pancreas, unspecified location of malignancy ([place]) [C25.9] Acute pancreatitis, unspecified complication status, unspecified pancreatitis type [K85.90] DISCHARGE DIAGNOSIS:  Principal Problem:   Pancreatic cancer ([place]) Active Problems:   UGI bleed   Acute pancreatitis   Acute blood loss anemia   Malignant neoplasm of pancreas ([place])   Failure to thrive in adult   Volume depletion  SECONDARY DIAGNOSIS:   Past Medical History:  Diagnosis Date  . Anemia   . Arthritis   . Basal cell carcinoma of face   . BULIMIA many decades of this   patient keeps alive by drinking ensure that she cannot purge  . GERD (gastroesophageal reflux disease)   . Hypercholesteremia   . KIDNEY DISEASE, CHRONIC, STAGE III 2010   Cr 1.3 -> .84, probably from NSAID's and lasix  . LEAKAGE, CONTINUOUS URINE 07/30/2009  . LOW BACK PAIN, CHRONIC    takes tylenol  . Macular degeneration disease    Dingledein, now [place]  . Major depressive disorder, recurrent episode, severe ([place])    well treated with SSRI's  . OSTEOPOROSIS    severe, treated with infusions, followed by endocrine in [place]  . Personal history of tobacco use, presenting hazards to health 08/11/2016  . Wears dentures    full upper and lower   HOSPITAL COURSE:   UGI bleed with melena. Stool occult is positive. She is treated with protonix and IV fluid support, EGD today is normal Per Dr. Vicente Males. No melena or bloody stool today.  USG RUQ Dilated common bile duct at 12.9 mm. Multiple hepatic lesions consistent with metastatic disease  again noted. Largest lesion is in the right lobe measures 2.8 cm in maximum diameter.  Acute blood loss anemia. Hemoglobin decreased to 8.5,  due to GI bleeding and IV fluid dilution. Follow-up hemoglobin today is 10.   Acute pancreatitis: Lipase is better, pain control and IV fluid support.   Elevated bilirubin. Possible due to pancreatic cancer. The Patient changed her mind and does not want ERCP.    Pancreatic cancer, likely diagnosis of metastatic pancreatic cancer. Follow-up follow-up oncologist as outpatient.  Poor oral intake and malnutrition. Supportive care.  Discussed with Dr. Vicente Males.  The patient refused PT evaluation and home health.  DISCHARGE CONDITIONS:  Stable, discharge to home today. CONSULTS OBTAINED:  Treatment Team:  Lucilla Lame, MD DRUG ALLERGIES:  No Known Allergies DISCHARGE MEDICATIONS:     Medication List    STOP taking these medications   aspirin 81 MG tablet   cephALEXin 500 MG capsule Commonly known as:  KEFLEX   ciprofloxacin 250 MG tablet Commonly known as:  CIPRO   meloxicam 7.5 MG tablet Commonly known as:  MOBIC     TAKE these medications   BIOTIN PO Take by mouth.   calcium citrate-vitamin D 200-200 MG-UNIT Tabs Take 3 tablets by mouth daily.   clonazePAM 0.5 MG tablet Commonly known as:  KLONOPIN 0.25 mg 2 (two) times daily as needed.   docusate sodium 100 MG capsule Commonly known as:  COLACE Take 100 mg by mouth 2 (two) times daily.  donepezil 10 MG tablet Commonly known as:  ARICEPT Take 10 mg by mouth.   fish oil-omega-3 fatty acids 1000 MG capsule Take 1 capsule by mouth daily.   FLUoxetine HCl 60 MG Tabs TAKE ONE TABLET BY MOUTH ONCE DAILY   gabapentin 100 MG capsule Commonly known as:  NEURONTIN Take 100 mg by mouth 4 (four) times daily.   HYDROcodone-acetaminophen 10-325 MG tablet Commonly known as:  NORCO Take 1 tablet by mouth every 6 (six) hours as needed. As needed for severe pain What  changed:  Another medication with the same name was removed. Continue taking this medication, and follow the directions you see here.   ICAPS PO Take by mouth.   metoCLOPramide 10 MG tablet Commonly known as:  REGLAN Take 1 tablet (10 mg total) by mouth every 8 (eight) hours as needed for nausea or vomiting.   mirtazapine 15 MG tablet Commonly known as:  REMERON Take 1 tablet (15 mg total) by mouth at bedtime.   omeprazole 40 MG capsule Commonly known as:  PRILOSEC TAKE ONE CAPSULE BY MOUTH ONCE DAILY   oxybutynin 15 MG 24 hr tablet Commonly known as:  DITROPAN XL Take 1 tablet (15 mg total) by mouth at bedtime.   pravastatin 20 MG tablet Commonly known as:  PRAVACHOL TAKE ONE TABLET BY MOUTH ONCE DAILY   predniSONE 10 MG tablet Commonly known as:  DELTASONE Take 2 tablets (20 mg total) by mouth daily.   promethazine 12.5 MG tablet Commonly known as:  PHENERGAN Take 1 tablet (12.5 mg total) by mouth every 8 (eight) hours as needed for nausea or vomiting.   promethazine 25 MG tablet Commonly known as:  PHENERGAN Take 1 tablet (25 mg total) by mouth every 6 (six) hours as needed for nausea or vomiting.   RA VITAMIN B-12 TR 1000 MCG Tbcr Generic drug:  Cyanocobalamin Take 1 tablet by mouth daily.   RECLAST 5 MG/100ML Soln injection Generic drug:  zoledronic acid Every year.   SYNTHROID 25 MCG tablet Generic drug:  levothyroxine Take 25 mcg by mouth daily before breakfast.   traMADol 50 MG tablet Commonly known as:  ULTRAM Take 1 tablet (50 mg total) by mouth every 6 (six) hours as needed for severe pain.   traZODone 50 MG tablet Commonly known as:  DESYREL TAKE ONE TO TWO TABLETS BY MOUTH ONCE DAILY   traZODone 50 MG tablet Commonly known as:  DESYREL TAKE ONE TO TWO TABLETS BY MOUTH ONCE DAILY   Vitamin D (Ergocalciferol) 50000 units Caps capsule Commonly known as:  DRISDOL Take 50,000 Units by mouth every 14 (fourteen) days.   vitamin E 400 UNIT  capsule Take 400 Units by mouth daily. Reported on 04/07/2016        DISCHARGE INSTRUCTIONS:  See AVS.  If you experience worsening of your admission symptoms, develop shortness of breath, life threatening emergency, suicidal or homicidal thoughts you must seek medical attention immediately by calling 911 or calling your MD immediately  if symptoms less severe.  You Must read complete instructions/literature along with all the possible adverse reactions/side effects for all the Medicines you take and that have been prescribed to you. Take any new Medicines after you have completely understood and accpet all the possible adverse reactions/side effects.   Please note  You were cared for by a hospitalist during your hospital stay. If you have any questions about your discharge medications or the care you received while you were in the hospital after you  are discharged, you can call the unit and asked to speak with the hospitalist on call if the hospitalist that took care of you is not available. Once you are discharged, your primary care physician will handle any further medical issues. Please note that NO REFILLS for any discharge medications will be authorized once you are discharged, as it is imperative that you return to your primary care physician (or establish a relationship with a primary care physician if you do not have one) for your aftercare needs so that they can reassess your need for medications and monitor your lab values.    On the day of Discharge:  VITAL SIGNS:  Blood pressure (!) 118/104, pulse 65, temperature 98.2 F (36.8 C), temperature source Oral, resp. rate 18, height 5\' 1"  (1.549 m), weight 129 lb 8 oz (58.7 kg), SpO2 98 %. PHYSICAL EXAMINATION:  GENERAL:  70 y.o.-year-old patient lying in the bed with no acute distress. Malnutrition. EYES: Pupils equal, round, reactive to light and accommodation. No scleral icterus. Extraocular muscles intact.  HEENT: Head  atraumatic, normocephalic. Oropharynx and nasopharynx clear.  NECK:  Supple, no jugular venous distention. No thyroid enlargement, no tenderness.  LUNGS: Normal breath sounds bilaterally, no wheezing, rales,rhonchi or crepitation. No use of accessory muscles of respiration.  CARDIOVASCULAR: S1, S2 normal. No murmurs, rubs, or gallops.  ABDOMEN: Soft, non-tender, non-distended. Bowel sounds present. No organomegaly or mass.  EXTREMITIES: No pedal edema, cyanosis, or clubbing.  NEUROLOGIC: Cranial nerves II through XII are intact. Muscle strength 5/5 in all extremities. Sensation intact. Gait not checked.  PSYCHIATRIC: The patient is alert and oriented x 3.  SKIN: No obvious rash, lesion, or ulcer.  DATA REVIEW:   CBC  Recent Labs Lab 08/31/16 0429 09/02/16 0433  WBC 4.9  --   HGB 8.5* 10.0*  HCT 25.9*  --   PLT 123*  --     Chemistries   Recent Labs Lab 08/30/16 0437  NA 139  K 3.7  CL 110  CO2 24  GLUCOSE 73  BUN 12  CREATININE 0.68  CALCIUM 8.9  AST 418*  ALT 453*  ALKPHOS 359*  BILITOT 1.8*     Microbiology Results  Results for orders placed or performed during the hospital encounter of 08/17/16  Urine culture     Status: Abnormal   Collection Time: 08/17/16  7:19 PM  Result Value Ref Range Status   Specimen Description URINE, RANDOM  Final   Special Requests NONE  Final   Culture (A)  Final    >=100,000 COLONIES/mL ESCHERICHIA COLI >=100,000 COLONIES/mL KLEBSIELLA PNEUMONIAE    Report Status 08/20/2016 FINAL  Final   Organism ID, Bacteria ESCHERICHIA COLI (A)  Final   Organism ID, Bacteria KLEBSIELLA PNEUMONIAE (A)  Final      Susceptibility   Escherichia coli - MIC*    AMPICILLIN 4 SENSITIVE Sensitive     CEFAZOLIN <=4 SENSITIVE Sensitive     CEFTRIAXONE <=1 SENSITIVE Sensitive     CIPROFLOXACIN <=0.25 SENSITIVE Sensitive     GENTAMICIN <=1 SENSITIVE Sensitive     IMIPENEM <=0.25 SENSITIVE Sensitive     NITROFURANTOIN <=16 SENSITIVE Sensitive      TRIMETH/SULFA <=20 SENSITIVE Sensitive     AMPICILLIN/SULBACTAM <=2 SENSITIVE Sensitive     PIP/TAZO <=4 SENSITIVE Sensitive     Extended ESBL NEGATIVE Sensitive     * >=100,000 COLONIES/mL ESCHERICHIA COLI   Klebsiella pneumoniae - MIC*    AMPICILLIN >=32 RESISTANT Resistant     CEFAZOLIN <=  4 SENSITIVE Sensitive     CEFTRIAXONE <=1 SENSITIVE Sensitive     CIPROFLOXACIN <=0.25 SENSITIVE Sensitive     GENTAMICIN <=1 SENSITIVE Sensitive     IMIPENEM <=0.25 SENSITIVE Sensitive     NITROFURANTOIN 128 RESISTANT Resistant     TRIMETH/SULFA <=20 SENSITIVE Sensitive     AMPICILLIN/SULBACTAM 8 SENSITIVE Sensitive     PIP/TAZO <=4 SENSITIVE Sensitive     Extended ESBL NEGATIVE Sensitive     * >=100,000 COLONIES/mL KLEBSIELLA PNEUMONIAE    RADIOLOGY:  No results found.   Management plans discussed with the patient, family and they are in agreement.  CODE STATUS:     Code Status Orders        Start     Ordered   08/29/16 2241  Do not attempt resuscitation (DNR)  Continuous    Question Answer Comment  In the event of cardiac or respiratory ARREST Do not call a "code blue"   In the event of cardiac or respiratory ARREST Do not perform Intubation, CPR, defibrillation or ACLS   In the event of cardiac or respiratory ARREST Use medication by any route, position, wound care, and other measures to relive pain and suffering. May use oxygen, suction and manual treatment of airway obstruction as needed for comfort.      08/29/16 2240    Code Status History    Date Active Date Inactive Code Status Order ID Comments User Context   This patient has a current code status but no historical code status.    Advance Directive Documentation   Flowsheet Row Most Recent Value  Type of Advance Directive  Healthcare Power of Attorney  Pre-existing out of facility DNR order (yellow form or pink MOST form)  No data  "MOST" Form in Place?  No data      TOTAL TIME TAKING CARE OF THIS PATIENT: 36  minutes.    Demetrios Loll M.D on 09/02/2016 at 5:05 PM  Between 7am to 6pm - Pager - [phone]  After 6pm go to [email or web address] - Proofreader  [facility]  Office  [phone]  CC: Primary care physician; Crecencio Mc, MD   Note: This dictation was prepared with Dragon dictation along with smaller phrase technology. Any transcriptional errors that result from this process are unintentional.

## 2016-09-02 NOTE — Transfer of Care (Signed)
Immediate Anesthesia Transfer of Care Note  Patient: Meagan Zavala  Procedure(s) Performed: Procedure(s): ESOPHAGOGASTRODUODENOSCOPY (EGD) (N/A)  Patient Location: PACU  Anesthesia Type:General  Level of Consciousness: awake and patient cooperative  Airway & Oxygen Therapy: Patient Spontanous Breathing and Patient connected to face mask oxygen  Post-op Assessment: Report given to RN and Post -op Vital signs reviewed and stable  Post vital signs: Reviewed and stable  Last Vitals:  Vitals:   09/02/16 1100 09/02/16 1223  BP: (!) 133/59 (!) 119/58  Pulse: 65 61  Resp: 17 18  Temp: 37.2 C 36.2 C    Last Pain:  Vitals:   09/02/16 1223  TempSrc: Tympanic  PainSc:          Complications: No apparent anesthesia complications

## 2016-09-02 NOTE — Op Note (Addendum)
[facility] Gastroenterology Patient Name: Meagan Zavala Procedure Date: 09/02/2016 12:00 PM MRN: [id number] Account #: [id number] Date of Birth: 1946-04-09 Admit Type: Outpatient Age: 70 Room: [facility] ENDO ROOM 1 Gender: Female Note Status: Finalized Procedure:            Upper GI endoscopy Indications:          Suspected gastrointestinal bleeding Providers:            Jonathon Bellows MD, MD Referring MD:         Deborra Medina, MD (Referring MD) Medicines:            Monitored Anesthesia Care Complications:        No immediate complications. Procedure:            Pre-Anesthesia Assessment:                       - Prior to the procedure, a History and Physical was                        performed, and patient medications, allergies and                        sensitivities were reviewed. The patient's tolerance of                        previous anesthesia was reviewed.                       - The risks and benefits of the procedure and the                        sedation options and risks were discussed with the                        patient. All questions were answered and informed                        consent was obtained.                       - The risks and benefits of the procedure and the                        sedation options and risks were discussed with the                        patient. All questions were answered and informed                        consent was obtained.                       - ASA Grade Assessment: III - A patient with severe                        systemic disease.                       After obtaining informed consent, the endoscope was  passed under direct vision. Throughout the procedure,                        the patient's blood pressure, pulse, and oxygen                        saturations were monitored continuously. The Endoscope                        was introduced through the mouth, and advanced to the                        third part of duodenum. The upper GI endoscopy was                        accomplished with ease. The patient tolerated the                        procedure well. Findings:      The esophagus was normal.      The examined jejunum was normal.      No gross lesions were noted in the stomach.      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 [Description]. Impression:           - Normal esophagus.                       - Normal examined jejunum.                       - No gross lesions in the stomach.                       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                       - No specimens collected. Recommendation:       - Written discharge instructions were provided to the                        patient.                       - Resume previous diet.                       - Continue present medications.                       - Return patient to hospital ward for ongoing care. Procedure Code(s):    --- Professional ---                       [phone], Esophagogastroduodenoscopy, flexible, transoral;                        diagnostic, including collection of specimen(s) by                        brushing or washing, when performed (separate procedure) Diagnosis Code(s):    ---  Professional ---                       Z98.0, Intestinal bypass and anastomosis status CPT copyright 2016 [employer]. All rights reserved. The codes documented in this report are preliminary and upon coder review may  be revised to meet current compliance requirements. Jonathon Bellows, MD Jonathon Bellows MD, MD 09/02/2016 12:19:11 PM This report has been signed electronically. Number of Addenda: 0 Note Initiated On: 09/02/2016 12:00 PM      [facility]

## 2016-09-02 NOTE — Progress Notes (Signed)
EGD was normal . HB stable . No further evalluation at this time.  She says that she does not wish to go for a stent placement at [facility]  I will discuss with her oncologist   Dr Jonathon Bellows  Gastroenterology/Hepatology Pager: [phone]

## 2016-09-02 NOTE — Progress Notes (Signed)
PT Cancellation Note  Patient Details Name: Meagan Zavala MRN: [id number] DOB: September 17, 1946   Cancelled Treatment:    Reason Eval/Treat Not Completed: Patient declined, no reason specified. Treatment attempted, pt refuses noting she does not feel up to it, stomach is bothering her and she is having a test done this morning.    Larae Grooms, PTA 09/02/2016, 10:26 AM

## 2016-09-02 NOTE — Anesthesia Preprocedure Evaluation (Signed)
Anesthesia Evaluation  Patient identified by MRN, date of birth, ID band Patient awake    Reviewed: Allergy & Precautions, H&P , NPO status , Patient's Chart, lab work & pertinent test results, reviewed documented beta blocker date and time   History of Anesthesia Complications Negative for: history of anesthetic complications  Airway Mallampati: I  TM Distance: >3 FB Neck ROM: full    Dental  (+) Edentulous Upper, Edentulous Lower, Upper Dentures, Lower Dentures   Pulmonary shortness of breath, neg sleep apnea, neg COPD, neg recent URI, former smoker,    Pulmonary exam normal breath sounds clear to auscultation       Cardiovascular Exercise Tolerance: Good negative cardio ROS Normal cardiovascular exam Rhythm:regular Rate:Normal     Neuro/Psych PSYCHIATRIC DISORDERS (Depression) negative neurological ROS     GI/Hepatic Neg liver ROS, GERD  ,  Endo/Other  neg diabetesHypothyroidism   Renal/GU CRFRenal disease  negative genitourinary   Musculoskeletal   Abdominal   Peds  Hematology  (+) Blood dyscrasia, anemia ,   Anesthesia Other Findings Past Medical History: No date: Anemia No date: Arthritis No date: Basal cell carcinoma of face many decades of this: BULIMIA     Comment: patient keeps alive by drinking ensure that               she cannot purge No date: GERD (gastroesophageal reflux disease) No date: Hypercholesteremia 2010: KIDNEY DISEASE, CHRONIC, STAGE III     Comment: Cr 1.3 -> .84, probably from NSAID's and lasix 07/30/2009: LEAKAGE, CONTINUOUS URINE No date: LOW BACK PAIN, CHRONIC     Comment: takes tylenol No date: Macular degeneration disease     Comment: [place], now [place] No date: Major depressive disorder, recurrent episode, *     Comment: well treated with SSRI's No date: OSTEOPOROSIS     Comment: severe, treated with infusions, followed by               endocrine in  [facility] 08/11/2016: Personal history of tobacco use, presenting ha* No date: Wears dentures     Comment: full upper and lower   Reproductive/Obstetrics negative OB ROS                             Anesthesia Physical Anesthesia Plan  ASA: III  Anesthesia Plan: General   Post-op Pain Management:    Induction:   Airway Management Planned:   Additional Equipment:   Intra-op Plan:   Post-operative Plan:   Informed Consent: I have reviewed the patients History and Physical, chart, labs and discussed the procedure including the risks, benefits and alternatives for the proposed anesthesia with the patient or authorized representative who has indicated his/her understanding and acceptance.   Dental Advisory Given  Plan Discussed with: Anesthesiologist, CRNA and Surgeon  Anesthesia Plan Comments:         Anesthesia Quick Evaluation

## 2016-09-02 NOTE — Discharge Instructions (Signed)
Regular diet. Fall precaution.

## 2016-09-02 NOTE — Plan of Care (Addendum)
Patient IV removed.  Discharge papers given, explained and educated.  Informed of suggested FU appts and appts made. Script for pain given.  RN assessment and VS revealed stability for DC to home.  Patient refusing [place] at this time. When ready, patient will be wheeled to front and family transporting home via car.

## 2016-09-02 NOTE — Progress Notes (Signed)
[facility]  Patient Status: refused [place] services due to copays, alerted Jeannie Fend, [place] and nurse Rodman Key.    Meagan Zavala 09/02/2016, 4:50 PM

## 2016-09-03 ENCOUNTER — Encounter: Payer: Self-pay | Admitting: Gastroenterology

## 2016-09-03 ENCOUNTER — Telehealth: Payer: Self-pay | Admitting: Internal Medicine

## 2016-09-03 ENCOUNTER — Emergency Department
Admission: EM | Admit: 2016-09-03 | Discharge: 2016-09-03 | Disposition: A | Discharge status: Other Acute Inpatient Hospital | Payer: PPO | Attending: Emergency Medicine | Admitting: Emergency Medicine

## 2016-09-03 ENCOUNTER — Emergency Department: Payer: PPO

## 2016-09-03 DIAGNOSIS — Z87891 Personal history of nicotine dependence: Secondary | ICD-10-CM | POA: Diagnosis not present

## 2016-09-03 DIAGNOSIS — C7889 Secondary malignant neoplasm of other digestive organs: Secondary | ICD-10-CM | POA: Diagnosis not present

## 2016-09-03 DIAGNOSIS — Z79899 Other long term (current) drug therapy: Secondary | ICD-10-CM | POA: Insufficient documentation

## 2016-09-03 DIAGNOSIS — E039 Hypothyroidism, unspecified: Secondary | ICD-10-CM | POA: Insufficient documentation

## 2016-09-03 DIAGNOSIS — N182 Chronic kidney disease, stage 2 (mild): Secondary | ICD-10-CM | POA: Diagnosis not present

## 2016-09-03 DIAGNOSIS — K5900 Constipation, unspecified: Secondary | ICD-10-CM | POA: Diagnosis not present

## 2016-09-03 DIAGNOSIS — R7989 Other specified abnormal findings of blood chemistry: Secondary | ICD-10-CM

## 2016-09-03 DIAGNOSIS — R109 Unspecified abdominal pain: Secondary | ICD-10-CM | POA: Diagnosis present

## 2016-09-03 DIAGNOSIS — E43 Unspecified severe protein-calorie malnutrition: Secondary | ICD-10-CM | POA: Diagnosis not present

## 2016-09-03 DIAGNOSIS — K831 Obstruction of bile duct: Secondary | ICD-10-CM | POA: Insufficient documentation

## 2016-09-03 DIAGNOSIS — C259 Malignant neoplasm of pancreas, unspecified: Secondary | ICD-10-CM | POA: Diagnosis not present

## 2016-09-03 DIAGNOSIS — C787 Secondary malignant neoplasm of liver and intrahepatic bile duct: Secondary | ICD-10-CM | POA: Diagnosis not present

## 2016-09-03 DIAGNOSIS — R64 Cachexia: Secondary | ICD-10-CM | POA: Diagnosis not present

## 2016-09-03 DIAGNOSIS — N183 Chronic kidney disease, stage 3 (moderate): Secondary | ICD-10-CM | POA: Insufficient documentation

## 2016-09-03 DIAGNOSIS — R945 Abnormal results of liver function studies: Secondary | ICD-10-CM | POA: Diagnosis not present

## 2016-09-03 DIAGNOSIS — D696 Thrombocytopenia, unspecified: Secondary | ICD-10-CM | POA: Diagnosis not present

## 2016-09-03 DIAGNOSIS — Z9884 Bariatric surgery status: Secondary | ICD-10-CM | POA: Diagnosis not present

## 2016-09-03 DIAGNOSIS — R627 Adult failure to thrive: Secondary | ICD-10-CM | POA: Diagnosis not present

## 2016-09-03 DIAGNOSIS — C250 Malignant neoplasm of head of pancreas: Secondary | ICD-10-CM | POA: Diagnosis not present

## 2016-09-03 DIAGNOSIS — F419 Anxiety disorder, unspecified: Secondary | ICD-10-CM | POA: Diagnosis not present

## 2016-09-03 DIAGNOSIS — F329 Major depressive disorder, single episode, unspecified: Secondary | ICD-10-CM | POA: Diagnosis not present

## 2016-09-03 DIAGNOSIS — I214 Non-ST elevation (NSTEMI) myocardial infarction: Secondary | ICD-10-CM | POA: Diagnosis not present

## 2016-09-03 DIAGNOSIS — D649 Anemia, unspecified: Secondary | ICD-10-CM | POA: Diagnosis not present

## 2016-09-03 DIAGNOSIS — Z66 Do not resuscitate: Secondary | ICD-10-CM | POA: Diagnosis not present

## 2016-09-03 DIAGNOSIS — R1010 Upper abdominal pain, unspecified: Secondary | ICD-10-CM

## 2016-09-03 LAB — URINALYSIS, COMPLETE (UACMP) WITH MICROSCOPIC
Bacteria, UA: NONE SEEN
Bilirubin Urine: NEGATIVE
GLUCOSE, UA: NEGATIVE mg/dL
Hgb urine dipstick: NEGATIVE
Ketones, ur: 5 mg/dL — AB
Leukocytes, UA: NEGATIVE
Nitrite: NEGATIVE
PH: 6 (ref 5.0–8.0)
Protein, ur: NEGATIVE mg/dL
SPECIFIC GRAVITY, URINE: 1.012 (ref 1.005–1.030)

## 2016-09-03 LAB — CBC WITH DIFFERENTIAL/PLATELET
Basophils Absolute: 0 10*3/uL (ref 0–0.1)
Basophils Relative: 1 %
EOS ABS: 0.1 10*3/uL (ref 0–0.7)
EOS PCT: 1 %
HCT: 30.7 % — ABNORMAL LOW (ref 35.0–47.0)
Hemoglobin: 10.4 g/dL — ABNORMAL LOW (ref 12.0–16.0)
LYMPHS ABS: 0.5 10*3/uL — AB (ref 1.0–3.6)
Lymphocytes Relative: 8 %
MCH: 30.1 pg (ref 26.0–34.0)
MCHC: 34 g/dL (ref 32.0–36.0)
MCV: 88.5 fL (ref 80.0–100.0)
MONOS PCT: 7 %
Monocytes Absolute: 0.4 10*3/uL (ref 0.2–0.9)
Neutro Abs: 4.7 10*3/uL (ref 1.4–6.5)
Neutrophils Relative %: 83 %
PLATELETS: 141 10*3/uL — AB (ref 150–440)
RBC: 3.47 MIL/uL — ABNORMAL LOW (ref 3.80–5.20)
RDW: 21.8 % — ABNORMAL HIGH (ref 11.5–14.5)
WBC: 5.6 10*3/uL (ref 3.6–11.0)

## 2016-09-03 LAB — COMPREHENSIVE METABOLIC PANEL
ALK PHOS: 352 U/L — AB (ref 38–126)
ALT: 241 U/L — AB (ref 14–54)
AST: 168 U/L — ABNORMAL HIGH (ref 15–41)
Albumin: 2.8 g/dL — ABNORMAL LOW (ref 3.5–5.0)
Anion gap: 9 (ref 5–15)
BILIRUBIN TOTAL: 3.9 mg/dL — AB (ref 0.3–1.2)
BUN: 9 mg/dL (ref 6–20)
CALCIUM: 8.8 mg/dL — AB (ref 8.9–10.3)
CO2: 23 mmol/L (ref 22–32)
CREATININE: 0.55 mg/dL (ref 0.44–1.00)
Chloride: 104 mmol/L (ref 101–111)
GFR calc non Af Amer: >60 mL/min (ref >60)
Glucose, Bld: 95 mg/dL (ref 65–99)
Potassium: 3.2 mmol/L — ABNORMAL LOW (ref 3.5–5.1)
SODIUM: 136 mmol/L (ref 135–145)
TOTAL PROTEIN: 6.1 g/dL — AB (ref 6.5–8.1)

## 2016-09-03 LAB — TROPONIN I: Troponin I: <0.03 ng/mL (ref <0.03)

## 2016-09-03 LAB — LIPASE, BLOOD: Lipase: 114 U/L — ABNORMAL HIGH (ref 11–51)

## 2016-09-03 MED ORDER — HYDROCODONE-ACETAMINOPHEN 5-325 MG PO TABS
2.0000 | ORAL_TABLET | Freq: Once | ORAL | Status: AC
  Administered 2016-09-03: 2 via ORAL
  Filled 2016-09-03: qty 2

## 2016-09-03 MED ORDER — ENSURE ENLIVE PO LIQD: 237.0000 mL | Freq: Two times a day (BID) | ORAL | Status: DC

## 2016-09-03 MED ORDER — MORPHINE SULFATE (PF) 4 MG/ML IV SOLN
INTRAVENOUS | Status: AC
  Administered 2016-09-03: 4 mg via INTRAVENOUS
  Filled 2016-09-03: qty 1

## 2016-09-03 MED ORDER — SODIUM CHLORIDE 0.9 % IV BOLUS (SEPSIS)
500.0000 mL | Freq: Once | INTRAVENOUS | Status: AC
  Administered 2016-09-03: 500 mL via INTRAVENOUS

## 2016-09-03 MED ORDER — ONDANSETRON HCL 4 MG/2ML IJ SOLN
4.0000 mg | Freq: Once | INTRAMUSCULAR | Status: AC
Start: 2016-09-03 — End: 2016-09-03
  Administered 2016-09-03: 4 mg via INTRAVENOUS

## 2016-09-03 MED ORDER — MORPHINE SULFATE (PF) 4 MG/ML IV SOLN
4.0000 mg | Freq: Once | INTRAVENOUS | Status: AC
  Administered 2016-09-03: 4 mg via INTRAVENOUS

## 2016-09-03 MED ORDER — ONDANSETRON HCL 4 MG/2ML IJ SOLN
INTRAMUSCULAR | Status: AC
  Administered 2016-09-03: 4 mg via INTRAVENOUS
  Filled 2016-09-03: qty 2

## 2016-09-03 NOTE — ED Provider Notes (Signed)
[facility] Emergency Department Provider Note ____________________________________________   I have reviewed the triage vital signs and the triage nursing note.  HISTORY  Chief Complaint Abdominal Pain   Historian Patient 2 friends at the bedside provide additional social history  HPI Meagan Zavala is a 70 y.o. female was brought in by EMS complaining of abdominal pain since last night since she was discharged from the hospital. This morning the pain was 10 out of 10 and she took her oxycodone tablet. Upon arrival pain had resolved.  Patient has been preliminarily diagnosed with metastatic pancreatic cancer, followed by Dr. Rogue Bussing at the cancer center. She was admitted the last 2 days with acute pancreatitis and abdominal pain suspected to be due to the pancreatic cancer.  Patient states that she wants to stay at home, but family states that she is not taking her pain medicine appropriately and there for hurting a lot. They think that she should be in the nursing home rehabilitation facility. They state that her cancer doctor had recommended that she go to acute care rehabilitation.  Patient states that she think she is okay at home. Friends state that she can't afford the home health care $25 co-pay per visit.  Reportedly there is a nephew who the friends say is the healthcare power of attorney.    Past Medical History:  Diagnosis Date  . Anemia   . Arthritis   . Basal cell carcinoma of face   . BULIMIA many decades of this   patient keeps alive by drinking ensure that she cannot purge  . GERD (gastroesophageal reflux disease)   . Hypercholesteremia   . KIDNEY DISEASE, CHRONIC, STAGE III 2010   Cr 1.3 -> .84, probably from NSAID's and lasix  . LEAKAGE, CONTINUOUS URINE 07/30/2009  . LOW BACK PAIN, CHRONIC    takes tylenol  . Macular degeneration disease    [place], now [place]  . Major depressive disorder, recurrent episode, severe  ([place])    well treated with SSRI's  . OSTEOPOROSIS    severe, treated with infusions, followed by endocrine in [place]  . Personal history of tobacco use, presenting hazards to health 08/11/2016  . Wears dentures    full upper and lower    Patient Active Problem List   Diagnosis Date Noted  . Failure to thrive in adult   . Volume depletion   . UGI bleed 08/29/2016  . Acute pancreatitis 08/29/2016  . Acute blood loss anemia 08/29/2016  . Pancreatic cancer ([place]) 08/29/2016  . Malignant neoplasm of pancreas ([place]) 08/29/2016  . Pancreatic mass 08/18/2016  . Personal history of tobacco use, presenting hazards to health 08/11/2016  . Lung nodule, solitary 08/10/2016  . IDA (iron deficiency anemia) 06/09/2016  . Vitamin D deficiency 04/09/2016  . Hypothyroidism 04/09/2016  . Intention tremor 01/10/2016  . Anemia of chronic disease 11/27/2015  . Fatty liver disease, nonalcoholic [id number]  . Other and unspecified hyperlipidemia 09/01/2013  . Medicare annual wellness visit, subsequent 09/16/2012  . Polyarthritis 04/16/2012  . Macular degeneration disease   . Neck pain 03/03/2011  . Insomnia 11/25/2010  . MCI (mild cognitive impairment) with memory loss 11/18/2010  . BULIMIA 07/30/2009  . Major depressive disorder, recurrent episode, moderate ([place]) 07/30/2009  . GERD 07/30/2009  . KIDNEY DISEASE, CHRONIC, STAGE III 07/30/2009  . LOW BACK PAIN, CHRONIC 07/30/2009  . Osteoporosis 07/30/2009  . Urinary incontinence 07/30/2009    Past Surgical History:  Procedure Laterality Date  . CARPAL TUNNEL RELEASE    .  CATARACT EXTRACTION    . COLON SURGERY  1980   partial colectomy  . COLONOSCOPY WITH PROPOFOL N/A 06/10/2016   Procedure: COLONOSCOPY WITH PROPOFOL;  Surgeon: Manya Silvas, MD;  Location: [facility] ENDOSCOPY;  Service: Endoscopy;  Laterality: N/A;  . ECTROPION REPAIR Bilateral 08/13/2015   Procedure: REPAIR OF ECTROPION;  Surgeon: Karle Starch, MD;  Location: [place];  Service: Ophthalmology;  Laterality: Bilateral;  . ESOPHAGOGASTRODUODENOSCOPY N/A 09/02/2016   Procedure: ESOPHAGOGASTRODUODENOSCOPY (EGD);  Surgeon: Jonathon Bellows, MD;  Location: [facility] ENDOSCOPY;  Service: Endoscopy;  Laterality: N/A;  . ESOPHAGOGASTRODUODENOSCOPY (EGD) WITH PROPOFOL N/A 06/10/2016   Procedure: ESOPHAGOGASTRODUODENOSCOPY (EGD) WITH PROPOFOL;  Surgeon: Manya Silvas, MD;  Location[facility] ENDOSCOPY;  Service: Endoscopy;  Laterality: N/A;  . 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 PTOSIS REPAIR Bilateral 08/13/2015   Procedure: PTOSIS REPAIR;  Surgeon: Karle Starch, MD;  Location: [place];  Service: Ophthalmology;  Laterality: Bilateral;  . SURGICAL EXCISION OF EXCESSIVE SKIN  1979  . TUBAL LIGATION      Prior to Admission medications   Medication Sig Start Date End Date Taking? Authorizing Provider  BIOTIN PO Take by mouth.   Yes Historical Provider, MD  calcium citrate-vitamin D 200-200 MG-UNIT TABS Take 3 tablets by mouth daily.    Yes Historical Provider, MD  clonazePAM (KLONOPIN) 0.5 MG tablet 0.25 mg 2 (two) times daily as needed.  11/18/14  Yes Historical Provider, MD  Cyanocobalamin (RA VITAMIN B-12 TR) 1000 MCG TBCR Take 1 tablet by mouth daily.    Yes Historical Provider, MD  docusate sodium (COLACE) 100 MG capsule Take 100 mg by mouth 2 (two) times daily.   Yes Historical Provider, MD  donepezil (ARICEPT) 10 MG tablet Take 10 mg by mouth. 05/12/16  Yes Historical Provider, MD  fish oil-omega-3 fatty acids 1000 MG capsule Take 1 capsule by mouth daily.   Yes Historical Provider, MD  FLUoxetine HCl 60 MG TABS Take 1 tablet by mouth daily.   Yes Historical Provider, MD  gabapentin (NEURONTIN) 100 MG capsule Take 100 mg by mouth 4 (four) times daily.   Yes Historical Provider, MD  HYDROcodone-acetaminophen (NORCO) 10-325 MG tablet Take 1 tablet by mouth every 6 (six) hours as needed. As needed for severe pain 09/02/16  Yes Demetrios Loll, MD   meloxicam (MOBIC) 7.5 MG tablet Take 7.5 mg by mouth daily as needed for pain.   Yes Historical Provider, MD  metoCLOPramide (REGLAN) 10 MG tablet Take 1 tablet (10 mg total) by mouth every 8 (eight) hours as needed for nausea or vomiting. 08/26/16  Yes Earleen [place], MD  mirtazapine (REMERON) 15 MG tablet Take 1 tablet (15 mg total) by mouth at bedtime. 11/25/15  Yes Crecencio Mc, MD  Multiple Vitamins-Minerals (ICAPS PO) Take by mouth.   Yes Historical Provider, MD  omeprazole (PRILOSEC) 40 MG capsule TAKE ONE CAPSULE BY MOUTH ONCE DAILY 08/25/16  Yes Crecencio Mc, MD  oxybutynin (DITROPAN XL) 15 MG 24 hr tablet Take 1 tablet (15 mg total) by mouth at bedtime. 04/07/16  Yes Crecencio Mc, MD  pravastatin (PRAVACHOL) 20 MG tablet TAKE ONE TABLET BY MOUTH ONCE DAILY 11/19/15  Yes Crecencio Mc, MD  promethazine (PHENERGAN) 25 MG tablet Take 1 tablet (25 mg total) by mouth every 6 (six) hours as needed for nausea or vomiting. 08/26/16  Yes Earleen [place], MD  SYNTHROID 25 MCG tablet Take 25  mcg by mouth daily before breakfast.  03/30/16  Yes Historical Provider, MD  traMADol (ULTRAM) 50 MG tablet Take 1 tablet (50 mg total) by mouth every 6 (six) hours as needed for severe pain. 08/17/16 08/17/17 Yes Nance Pear, MD  traZODone (DESYREL) 50 MG tablet Take 50-100 mg by mouth at bedtime as needed for sleep.   Yes Historical Provider, MD  Vitamin D, Ergocalciferol, (DRISDOL) 50000 UNITS CAPS Take 50,000 Units by mouth every 14 (fourteen) days.   Yes Historical Provider, MD  vitamin E 400 UNIT capsule Take 400 Units by mouth daily. Reported on 04/07/2016   Yes Historical Provider, MD  predniSONE (DELTASONE) 10 MG tablet Take 2 tablets (20 mg total) by mouth daily. Patient not taking: Reported on 09/03/2016 07/30/16   Lorin Picket, PA-C  zoledronic acid (RECLAST) 5 MG/100ML SOLN Every year.    Historical Provider, MD    No Known Allergies  Family History  Problem Relation Age of  Onset  . Cancer Mother     breast,   . Breast cancer Mother   . Cancer Father     lung  . Cancer Sister     lung, brain  . Cancer Brother     throat    Social History Social History  Substance Use Topics  . Smoking status: Former Smoker    Packs/day: 1.50    Years: 40.00    Quit date: 10/02/2010  . Smokeless tobacco: Never Used  . 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 Eyes: Negative for visual changes. ENT: Negative for sore throat. Cardiovascular: Negative for chest pain. Respiratory: Negative for shortness of breath. Gastrointestinal: Mid abdominal pain, sharp and cramping earlier today, now resolved. Genitourinary: Negative for dysuria. Musculoskeletal: Negative for back pain. Skin: Negative for rash. Neurological: Negative for headache. 10 point Review of Systems otherwise negative ____________________________________________   PHYSICAL EXAM:  VITAL SIGNS: ED Triage Vitals  Enc Vitals Group     BP 09/03/16 0751 120/63     Pulse Rate 09/03/16 0751 (!) 56     Resp 09/03/16 0751 16     Temp 09/03/16 0751 97.6 F (36.4 C)     Temp Source 09/03/16 0751 Oral     SpO2 09/03/16 0751 96 %     Weight 09/03/16 0751 126 lb 14.4 oz (57.6 kg)     Height 09/03/16 0751 5\' 1"  (1.549 m)     Head Circumference --      Peak Flow --      Pain Score 09/03/16 0756 0     Pain Loc --      Pain Edu? --      Excl. in [place]? --      Constitutional: Alert and cooperative. Pale, but in no distress. HEENT   Head: Normocephalic and atraumatic.      Eyes: Conjunctivae are normal. PERRL. Normal extraocular movements.      Ears:         Nose: No congestion/rhinnorhea.   Mouth/Throat: Mucous membranes are moist.   Neck: No stridor. Cardiovascular/Chest: Normal rate, regular rhythm.  No murmurs, rubs, or gallops. Respiratory: Normal respiratory effort without tachypnea nor retractions.  Breath sounds are clear and equal bilaterally. No wheezes/rales/rhonchi. Gastrointestinal: Soft. No distention, no guarding, no rebound. Nontender.    Genitourinary/rectal:Deferred Musculoskeletal: Nontender with normal range of motion in all extremities. No joint effusions.  No  lower extremity tenderness.  No edema. Neurologic:  Normal speech and language. No gross or focal neurologic deficits are appreciated. Skin:  Skin is warm, dry and intact. No rash noted. Psychiatric: Mood and affect are normal. Speech and behavior are normal. Patient exhibits appropriate insight and judgment.   ____________________________________________  LABS (pertinent positives/negatives)  Labs Reviewed  COMPREHENSIVE METABOLIC PANEL - Abnormal; Notable for the following:       Result Value   Potassium 3.2 (*)    Calcium 8.8 (*)    Total Protein 6.1 (*)    Albumin 2.8 (*)    AST 168 (*)    ALT 241 (*)    Alkaline Phosphatase 352 (*)    Total Bilirubin 3.9 (*)    All other components within normal limits  LIPASE, BLOOD - Abnormal; Notable for the following:    Lipase 114 (*)    All other components within normal limits  CBC WITH DIFFERENTIAL/PLATELET - Abnormal; Notable for the following:    RBC 3.47 (*)    Hemoglobin 10.4 (*)    HCT 30.7 (*)    RDW 21.8 (*)    Platelets 141 (*)    Lymphs Abs 0.5 (*)    All other components within normal limits  URINALYSIS, COMPLETE (UACMP) WITH MICROSCOPIC - Abnormal; Notable for the following:    Color, Urine AMBER (*)    APPearance CLEAR (*)    Ketones, ur 5 (*)    Squamous Epithelial / LPF 0-5 (*)    All other components within normal limits  TROPONIN I    ____________________________________________    EKG I, Lisa Roca, MD, the attending physician have personally viewed and interpreted all ECGs.  None ____________________________________________  RADIOLOGY All Xrays were viewed by me. Imaging interpreted by  Radiologist.  None __________________________________________  PROCEDURES  Procedure(s) performed: None  Critical Care performed: None  ____________________________________________   ED COURSE / ASSESSMENT AND PLAN  Pertinent labs & imaging results that were available during my care of the patient were reviewed by me and considered in my medical decision making (see chart for details).   Meagan Zavala unfortunately has a presumptive diagnosis of pancreatic cancer, and a recent episode with acute pancreatitis and GI bleed, discharged home yesterday and had intermittent pain last night and this morning which ultimately did resolve with by mouth oxycodone that she took an hour and half before arrival.  Family is most concerned that she is unable to take care of herself, and manage taking pain medications, and provide adequate intake as she has no appetite.  Patient herself initially stated that she wanted to be at home, but then did agree with her friends that she didn't seem to be taking adequate care of herself.  From an acute standpoint, the abdominal pain is resolved, it does not sound like it was cardiac/chest in location, and I will check some screening laboratory studies with respect to the recent anemia/GI bleed, and pancreatitis, but I'm suspicious this is related to the underlying pancreatic cancer. Given that her pain is controlled now, no additional symptomatic medications are needed at this point time.  I am going to consult social work and physical therapy for evaluation for the possibility of nursing home placement.  LFTs and bili are increased. Lipase is decreased. Pain started coming back, and I'm concerned that her symptoms and bili are indicative of progression of biliary blockage from pancreatic head mass. In review of Dr. Vicente Males as GI note, recommends neck step [facility] for  single balloon enteroscopy/ ERCP, CBD stent,  due to history of prior gastric bypass.  I spoke with  both the patient as well as her health care power of attorney her nephew, Meagan Zavala, who is in agreement of the plan to transfer to [facility].  I spoke with [facility] transfer center who spoke with Dr. Okey Dupre medicine/GI at [facility], who stated he would see in consult, but recommends ED ED transfer due to diversion. However due to full diversion, transverse canceled.  I spoke with GI at [facility] to see if they had the capability for this procedure that this patient needs - spoke with Dr. Jeraldine Loots.  The could do the procedure she needs, but 10 patient waiting list for transfer- unlikely to happen today. Alternatively, could consider as day case.  However, this patient is still having pain control issues here in the emergency department, and she also does not have adequate transportation. I did pursue other transfer options, and consulted with GI at [facility] and spoke with Dr. Lynita Lombard, who did state he would be able to consult and help with this procedure moving forward. Transfer center and GI spoke with their hospitalist who accepted this patient in transfer, Dr. Madalyn Rob, medicine.  I again called the health care power of attorney Meagan Zavala, who was happy with this plan.  Awaiting bed assignment for transfer to [facility].    CONSULTATIONS:   Social work and PT - PT from few days ago indicated ok for home.  After review of labs and prior GI recs, it appears GI at [facility] not have the capability for this patients procedure -- attempted transfer to [facility] -- but they were on full diversion.  Spoke with [place] and biliary attending who are willing to try to make the patient a day case given the fact that there was a long delay, possibly days for ER or inpatient transfer to [facility].  However urinalysis is adequate for this patient, and I spoke with GI at [facility] and patient was able to be accepted in transfer to go there tonight. I spoke again with Dr. Jeraldine Loots to let them know that we appreciated their  attempts to make a good plan for this patient, but that patient will be transferred to [facility].   Patient / Family / Caregiver informed of clinical course, medical decision-making process, and agree with plan.   ___________________________________________   FINAL CLINICAL IMPRESSION(S) / ED DIAGNOSES   Final diagnoses:  Pain of upper abdomen  Failure to thrive in adult  Biliary obstruction  Abnormal LFTs (liver function tests)              Note: This dictation was prepared with Dragon dictation. Any transcriptional errors that result from this process are unintentional    Lisa Roca, MD 09/03/16 1540

## 2016-09-03 NOTE — ED Triage Notes (Signed)
Pt arrives from home with reports of lower abdominal pain  Pt took 2 oxycodone approx 1 1/2 hours pta due to 10/10 pain at that time  Pt no reports no pain  Recent dx with liver and pancreatic ca

## 2016-09-03 NOTE — ED Notes (Signed)
Report given to [place]

## 2016-09-03 NOTE — ED Notes (Signed)
Pt's HCPOA is her nephew Torrie Ulch. Phone number: [phone]. Larene Beach lives in [place] and states he will be at the hospital later today to see pt.  Georga Kaufmann, MSW, [place]

## 2016-09-03 NOTE — Clinical Social Work Note (Signed)
Clinical Social Work Assessment  Patient Details  Name: Meagan Zavala MRN: [id number] Date of Birth: 30-Mar-1946  Date of referral:  09/03/16               Reason for consult:  Facility Placement                Permission sought to share information with:  Family Supports Permission granted to share information::  Yes, Verbal Permission Granted  Name::     Selva Nooner (nephew and HCPOA) [phone]  Agency::     Relationship::     Contact Information:     Housing/Transportation Living arrangements for the past 2 months:  [place] of Information:  Patient, Other (Comment Required) (Nephew (via phone)) Patient Interpreter Needed:  None Criminal Activity/Legal Involvement Pertinent to Current Situation/Hospitalization:  No - Comment as needed Significant Relationships:  Other Family Members, Friend Lives with:  Self Do you feel safe going back to the place where you live?  Yes Need for family participation in patient care:  Yes (Comment)  Care giving concerns: Pt is having trouble living independently at this time.    Social Worker assessment / plan: Pt is a 70 yo white female with pancreatitic cancer. Pt currently lives alone and has support from her friend who lives locally and her nephew who lives in [place]. Pt's nephew Larene Beach) is her HCPOA but also cares for his elderly mother (pt's sister). Pt has been having complaints of extreme stomach pain which has resulted in 2 hospital visits this week alone. CSW received consult for possible facility placement. CSW engaged with pt at her bedside. No one else was present during the time of the assessment. CSW introduced herself and her role as a Education officer, museum. CSW inquired about pt's wishes for discharge and pt states that she does not want to go to a facility but instead would like to return home. Pt reports that she currently receives home health services but is not able to afford them as frequently as she would like.  CSW provided pt with information about local facilities however, pt made it very clear that she would like to return home. After meeting with pt, CSW called Larene Beach (nephew). Larene Beach confirms that he is the [facility] and he does not think it is safe for pt to return home alone. CSW briefly reviewed some options with pt's nephew via telephone. If pt discharges to a facility it will need to be out of pocket. PT recommended home health on 12/5 so insurance will not cover a SNF stay. Hospice or palliative care could also be an option for the pt. Otherwise, she will d/c home once again with the home health services she already has in place. Larene Beach states that he will be at the hospital later today and would like to make a decision then. CSW expressed her understanding.  Employment status:  Retired Nurse, adult PT Recommendations:  Home with [place] / Referral to community resources:  [place], Other (Comment Required) ([place])  Patient/Family's Response to care: D/C plan is pending HCPOA's decision.  Patient/Family's Understanding of and Emotional Response to Diagnosis, Current Treatment, and Prognosis: Pt and pt's family are appreciative of the care provided by CSW at this time.  Emotional Assessment Appearance:  Appears stated age Attitude/Demeanor/Rapport:  Other (Cooperative) Affect (typically observed):  Sad, Appropriate Orientation:  Oriented to Self, Oriented to Place, Oriented to  Time, Oriented to Situation Alcohol / Substance  use:  Not Applicable Psych involvement (Current and /or in the community):  No (Comment)  Discharge Needs  Concerns to be addressed:  Care Coordination, Discharge Planning Concerns Readmission within the last 30 days:    Current discharge risk:  Chronically ill, Terminally ill, Lives alone Barriers to Discharge:  Continued Medical Work up   [employer], [place] 09/03/2016, 10:40 AM

## 2016-09-03 NOTE — Telephone Encounter (Signed)
She was discharged yesterday but has a lot of pain in stomach again and so her friend, Tomasa Rand has her back at the ER again now. Arbie Cookey just called to let you know. Thanks.

## 2016-09-03 NOTE — Telephone Encounter (Signed)
Arbie Cookey called again to inform you that they are transporting Ms. Meagan Zavala to [facility]. She thinks they are going to put a stent in but wasn't sure exactly what they are doing.

## 2016-09-03 NOTE — ED Notes (Signed)
Pt denies pain on arrival. Pt states she thinks she just did not give her pain medication time to work. Pt denies NVD. Pt was seen here yesterday and discharged.

## 2016-09-03 NOTE — Progress Notes (Signed)
PT Cancellation Note  Patient Details Name: Meagan Zavala MRN: [id number] DOB: 04-Mar-1946   Cancelled Treatment:    Reason Eval/Treat Not Completed: Other (comment).  PT called ED MD Lord to discuss pt's PT needs/PT order around noon today.  At that time, ED MD reported plan to admit pt and no need to see pt in ED.  ED MD also reported plan to cancel PT order to see pt in ED (order still in system 1630 09/03/16; will re-check on pt's status tomorrow if still in ED with PT order).  Also spoke with SW who reported no specific SW needs for PT to see pt in ED (d/t pt recently in hospital with recent hospital PT notes).   Raquel Sarna Theodosia Bahena 09/03/2016, 4:37 PM Leitha Bleak, [place]

## 2016-09-03 NOTE — ED Notes (Signed)
Patient transported to Ultrasound 

## 2016-09-04 ENCOUNTER — Other Ambulatory Visit: Payer: Self-pay

## 2016-09-04 ENCOUNTER — Inpatient Hospital Stay: Payer: PPO | Admitting: Internal Medicine

## 2016-09-04 NOTE — Anesthesia Postprocedure Evaluation (Signed)
Anesthesia Post Note  Patient: Meagan Zavala  Procedure(s) Performed: Procedure(s) (LRB): ESOPHAGOGASTRODUODENOSCOPY (EGD) (N/A)  Patient location during evaluation: Endoscopy Anesthesia Type: General Level of consciousness: awake and alert Pain management: pain level controlled Vital Signs Assessment: post-procedure vital signs reviewed and stable Respiratory status: spontaneous breathing, nonlabored ventilation, respiratory function stable and patient connected to nasal cannula oxygen Cardiovascular status: blood pressure returned to baseline and stable Postop Assessment: no signs of nausea or vomiting Anesthetic complications: no    Last Vitals:  Vitals:   09/02/16 1243 09/02/16 1415  BP: (!) 140/100 (!) 118/104  Pulse: 64 65  Resp: 17 18  Temp:  36.8 C    Last Pain:  Vitals:   09/02/16 1415  TempSrc: Oral  PainSc:                  Martha Clan

## 2016-09-04 NOTE — Patient Outreach (Signed)
[facility]) Care Management  09/04/2016  MARRIANNA LIDE Nov 28, 1945 [id number]    Transition of Care Referral  Referral Date:  09/04/16 Referral Source: [place] Discharge Report Date of Discharge: 09/02/16 Facility: [place]: HTA   Upon review of chart  RN CM noted that patient presented back to the ED on yesterday. She was then transferred to [facility] for inpatient admission.  Plan: RN CM will notify [facility] administrative assistant of patient status and defer [facility] referral at this time.   Enzo Montgomery, RN,BSN,CCM [place] Management Telephonic Care Management Coordinator Direct Phone: [phone] Toll Free: [phone] Fax: [phone]

## 2016-09-05 DIAGNOSIS — K869 Disease of pancreas, unspecified: Secondary | ICD-10-CM | POA: Diagnosis not present

## 2016-09-05 DIAGNOSIS — E039 Hypothyroidism, unspecified: Secondary | ICD-10-CM | POA: Diagnosis not present

## 2016-09-05 DIAGNOSIS — D696 Thrombocytopenia, unspecified: Secondary | ICD-10-CM | POA: Diagnosis not present

## 2016-09-05 DIAGNOSIS — F418 Other specified anxiety disorders: Secondary | ICD-10-CM | POA: Diagnosis not present

## 2016-09-06 DIAGNOSIS — F418 Other specified anxiety disorders: Secondary | ICD-10-CM | POA: Diagnosis not present

## 2016-09-06 DIAGNOSIS — D696 Thrombocytopenia, unspecified: Secondary | ICD-10-CM | POA: Diagnosis not present

## 2016-09-06 DIAGNOSIS — E039 Hypothyroidism, unspecified: Secondary | ICD-10-CM | POA: Diagnosis not present

## 2016-09-06 DIAGNOSIS — K869 Disease of pancreas, unspecified: Secondary | ICD-10-CM | POA: Diagnosis not present

## 2016-09-07 ENCOUNTER — Ambulatory Visit: Payer: PPO | Admitting: Internal Medicine

## 2016-09-07 DIAGNOSIS — K831 Obstruction of bile duct: Secondary | ICD-10-CM | POA: Diagnosis not present

## 2016-09-07 DIAGNOSIS — Z4589 Encounter for adjustment and management of other implanted devices: Secondary | ICD-10-CM | POA: Diagnosis not present

## 2016-09-07 DIAGNOSIS — D696 Thrombocytopenia, unspecified: Secondary | ICD-10-CM | POA: Diagnosis not present

## 2016-09-07 DIAGNOSIS — K869 Disease of pancreas, unspecified: Secondary | ICD-10-CM | POA: Diagnosis not present

## 2016-09-07 DIAGNOSIS — F418 Other specified anxiety disorders: Secondary | ICD-10-CM | POA: Diagnosis not present

## 2016-09-07 DIAGNOSIS — E039 Hypothyroidism, unspecified: Secondary | ICD-10-CM | POA: Diagnosis not present

## 2016-09-08 ENCOUNTER — Ambulatory Visit: Payer: PPO | Admitting: Internal Medicine

## 2016-09-08 DIAGNOSIS — K869 Disease of pancreas, unspecified: Secondary | ICD-10-CM | POA: Diagnosis not present

## 2016-09-08 DIAGNOSIS — R140 Abdominal distension (gaseous): Secondary | ICD-10-CM | POA: Diagnosis not present

## 2016-09-08 DIAGNOSIS — Z4803 Encounter for change or removal of drains: Secondary | ICD-10-CM | POA: Diagnosis not present

## 2016-09-09 ENCOUNTER — Inpatient Hospital Stay: Payer: PPO | Admitting: Internal Medicine

## 2016-09-10 ENCOUNTER — Telehealth: Payer: Self-pay | Admitting: *Deleted

## 2016-09-10 DIAGNOSIS — K869 Disease of pancreas, unspecified: Secondary | ICD-10-CM | POA: Diagnosis not present

## 2016-09-10 DIAGNOSIS — H532 Diplopia: Secondary | ICD-10-CM | POA: Diagnosis not present

## 2016-09-10 DIAGNOSIS — K831 Obstruction of bile duct: Secondary | ICD-10-CM | POA: Diagnosis not present

## 2016-09-10 DIAGNOSIS — F418 Other specified anxiety disorders: Secondary | ICD-10-CM | POA: Diagnosis not present

## 2016-09-10 DIAGNOSIS — D696 Thrombocytopenia, unspecified: Secondary | ICD-10-CM | POA: Diagnosis not present

## 2016-09-10 DIAGNOSIS — R079 Chest pain, unspecified: Secondary | ICD-10-CM | POA: Diagnosis not present

## 2016-09-10 DIAGNOSIS — D649 Anemia, unspecified: Secondary | ICD-10-CM | POA: Diagnosis not present

## 2016-09-10 DIAGNOSIS — R072 Precordial pain: Secondary | ICD-10-CM | POA: Diagnosis not present

## 2016-09-10 NOTE — Telephone Encounter (Signed)
She is in hospital at [facility] and they are not helping her, he wants to have her back home in [place] or at hospice. States no one there seems to be able to help him with this and he is reaching out to Dr B for assistance. Please call him [phone] or advise.

## 2016-09-11 DIAGNOSIS — K831 Obstruction of bile duct: Secondary | ICD-10-CM | POA: Diagnosis not present

## 2016-09-11 DIAGNOSIS — Z515 Encounter for palliative care: Secondary | ICD-10-CM | POA: Diagnosis not present

## 2016-09-11 DIAGNOSIS — D649 Anemia, unspecified: Secondary | ICD-10-CM | POA: Diagnosis not present

## 2016-09-11 DIAGNOSIS — H532 Diplopia: Secondary | ICD-10-CM | POA: Diagnosis not present

## 2016-09-11 DIAGNOSIS — Z7189 Other specified counseling: Secondary | ICD-10-CM | POA: Diagnosis not present

## 2016-09-11 DIAGNOSIS — D696 Thrombocytopenia, unspecified: Secondary | ICD-10-CM | POA: Diagnosis not present

## 2016-09-11 DIAGNOSIS — K869 Disease of pancreas, unspecified: Secondary | ICD-10-CM | POA: Diagnosis not present

## 2016-09-11 DIAGNOSIS — R64 Cachexia: Secondary | ICD-10-CM | POA: Diagnosis not present

## 2016-09-11 DIAGNOSIS — E039 Hypothyroidism, unspecified: Secondary | ICD-10-CM | POA: Diagnosis not present

## 2016-09-11 DIAGNOSIS — E43 Unspecified severe protein-calorie malnutrition: Secondary | ICD-10-CM | POA: Diagnosis not present

## 2016-09-11 DIAGNOSIS — C250 Malignant neoplasm of head of pancreas: Secondary | ICD-10-CM | POA: Diagnosis not present

## 2016-09-11 DIAGNOSIS — F418 Other specified anxiety disorders: Secondary | ICD-10-CM | POA: Diagnosis not present

## 2016-09-11 DIAGNOSIS — I214 Non-ST elevation (NSTEMI) myocardial infarction: Secondary | ICD-10-CM | POA: Diagnosis not present

## 2016-09-11 NOTE — Telephone Encounter (Signed)
Per v/o Dr. Rogue Bussing- RN contacted [facility] - Dr. Vedia Coffer. [phone].  Spoke with Dr. Lorette Ang admin. Environmental consultant. Dr. Maylon Peppers will call Dr. Rogue Bussing as soon as possible.   Dr. Rogue Bussing would like to wait to speak to POA until he has spoken with Dr. Maylon Peppers.

## 2016-09-11 NOTE — Telephone Encounter (Signed)
As per my discussion with the oncology fellow- patient had a drain placed; and she is awaiting a celiac plexus block for pain control. I did recommend palliative care/hospice evaluation. He agrees. He will inform the primary team.   Spoke to pt's nephew/ Amaryllis Dyke; reviewed my discussion with Oncology fellow at [facility]. He has been contacted by the palliative care team/social worker at [place] at hospice options close at the [place].  FYI-

## 2016-09-12 DIAGNOSIS — D696 Thrombocytopenia, unspecified: Secondary | ICD-10-CM | POA: Diagnosis not present

## 2016-09-12 DIAGNOSIS — F418 Other specified anxiety disorders: Secondary | ICD-10-CM | POA: Diagnosis not present

## 2016-09-12 DIAGNOSIS — D649 Anemia, unspecified: Secondary | ICD-10-CM | POA: Diagnosis not present

## 2016-09-12 DIAGNOSIS — K869 Disease of pancreas, unspecified: Secondary | ICD-10-CM | POA: Diagnosis not present

## 2016-09-12 DIAGNOSIS — R079 Chest pain, unspecified: Secondary | ICD-10-CM | POA: Diagnosis not present

## 2016-09-13 DIAGNOSIS — K869 Disease of pancreas, unspecified: Secondary | ICD-10-CM | POA: Diagnosis not present

## 2016-09-13 DIAGNOSIS — F418 Other specified anxiety disorders: Secondary | ICD-10-CM | POA: Diagnosis not present

## 2016-09-13 DIAGNOSIS — D649 Anemia, unspecified: Secondary | ICD-10-CM | POA: Diagnosis not present

## 2016-09-13 DIAGNOSIS — D696 Thrombocytopenia, unspecified: Secondary | ICD-10-CM | POA: Diagnosis not present

## 2016-09-13 DIAGNOSIS — R079 Chest pain, unspecified: Secondary | ICD-10-CM | POA: Diagnosis not present

## 2016-09-14 ENCOUNTER — Ambulatory Visit: Payer: PPO | Admitting: Internal Medicine

## 2016-09-14 DIAGNOSIS — F418 Other specified anxiety disorders: Secondary | ICD-10-CM | POA: Diagnosis not present

## 2016-09-14 DIAGNOSIS — H532 Diplopia: Secondary | ICD-10-CM | POA: Diagnosis not present

## 2016-09-14 DIAGNOSIS — D696 Thrombocytopenia, unspecified: Secondary | ICD-10-CM | POA: Diagnosis not present

## 2016-09-14 DIAGNOSIS — D649 Anemia, unspecified: Secondary | ICD-10-CM | POA: Diagnosis not present

## 2016-09-14 DIAGNOSIS — K869 Disease of pancreas, unspecified: Secondary | ICD-10-CM | POA: Diagnosis not present

## 2016-09-14 DIAGNOSIS — Z515 Encounter for palliative care: Secondary | ICD-10-CM | POA: Diagnosis not present

## 2016-09-14 DIAGNOSIS — R079 Chest pain, unspecified: Secondary | ICD-10-CM | POA: Diagnosis not present

## 2016-09-14 DIAGNOSIS — I214 Non-ST elevation (NSTEMI) myocardial infarction: Secondary | ICD-10-CM | POA: Diagnosis not present

## 2016-09-14 DIAGNOSIS — Z7189 Other specified counseling: Secondary | ICD-10-CM | POA: Diagnosis not present

## 2016-09-15 ENCOUNTER — Ambulatory Visit: Payer: PPO

## 2016-09-15 ENCOUNTER — Ambulatory Visit: Payer: PPO | Admitting: Internal Medicine

## 2016-09-15 DIAGNOSIS — K869 Disease of pancreas, unspecified: Secondary | ICD-10-CM | POA: Diagnosis not present

## 2016-09-15 DIAGNOSIS — H532 Diplopia: Secondary | ICD-10-CM | POA: Diagnosis not present

## 2016-09-15 DIAGNOSIS — I214 Non-ST elevation (NSTEMI) myocardial infarction: Secondary | ICD-10-CM | POA: Diagnosis not present

## 2016-09-15 DIAGNOSIS — D649 Anemia, unspecified: Secondary | ICD-10-CM | POA: Diagnosis not present

## 2016-09-16 DIAGNOSIS — I214 Non-ST elevation (NSTEMI) myocardial infarction: Secondary | ICD-10-CM | POA: Diagnosis not present

## 2016-09-16 DIAGNOSIS — H532 Diplopia: Secondary | ICD-10-CM | POA: Diagnosis not present

## 2016-09-16 DIAGNOSIS — K869 Disease of pancreas, unspecified: Secondary | ICD-10-CM | POA: Diagnosis not present

## 2016-09-16 DIAGNOSIS — D649 Anemia, unspecified: Secondary | ICD-10-CM | POA: Diagnosis not present

## 2016-09-17 DIAGNOSIS — I214 Non-ST elevation (NSTEMI) myocardial infarction: Secondary | ICD-10-CM | POA: Diagnosis not present

## 2016-09-17 DIAGNOSIS — D649 Anemia, unspecified: Secondary | ICD-10-CM | POA: Diagnosis not present

## 2016-09-17 DIAGNOSIS — H532 Diplopia: Secondary | ICD-10-CM | POA: Diagnosis not present

## 2016-09-17 DIAGNOSIS — K869 Disease of pancreas, unspecified: Secondary | ICD-10-CM | POA: Diagnosis not present

## 2016-09-18 DIAGNOSIS — F3289 Other specified depressive episodes: Secondary | ICD-10-CM | POA: Diagnosis not present

## 2016-09-18 DIAGNOSIS — K8689 Other specified diseases of pancreas: Secondary | ICD-10-CM | POA: Diagnosis not present

## 2016-09-18 DIAGNOSIS — E640 Sequelae of protein-calorie malnutrition: Secondary | ICD-10-CM | POA: Diagnosis not present

## 2016-09-18 DIAGNOSIS — D649 Anemia, unspecified: Secondary | ICD-10-CM | POA: Diagnosis not present

## 2016-09-18 DIAGNOSIS — Z5189 Encounter for other specified aftercare: Secondary | ICD-10-CM | POA: Diagnosis not present

## 2016-09-18 DIAGNOSIS — M6281 Muscle weakness (generalized): Secondary | ICD-10-CM | POA: Diagnosis not present

## 2016-09-18 DIAGNOSIS — F419 Anxiety disorder, unspecified: Secondary | ICD-10-CM | POA: Diagnosis not present

## 2016-09-18 DIAGNOSIS — F5101 Primary insomnia: Secondary | ICD-10-CM | POA: Diagnosis not present

## 2016-09-18 DIAGNOSIS — E039 Hypothyroidism, unspecified: Secondary | ICD-10-CM | POA: Diagnosis not present

## 2016-09-18 DIAGNOSIS — H532 Diplopia: Secondary | ICD-10-CM | POA: Diagnosis not present

## 2016-09-18 DIAGNOSIS — R627 Adult failure to thrive: Secondary | ICD-10-CM | POA: Diagnosis not present

## 2016-09-18 DIAGNOSIS — I509 Heart failure, unspecified: Secondary | ICD-10-CM | POA: Diagnosis not present

## 2016-09-18 DIAGNOSIS — K219 Gastro-esophageal reflux disease without esophagitis: Secondary | ICD-10-CM | POA: Diagnosis not present

## 2016-09-18 DIAGNOSIS — I214 Non-ST elevation (NSTEMI) myocardial infarction: Secondary | ICD-10-CM | POA: Diagnosis not present

## 2016-09-18 DIAGNOSIS — F509 Eating disorder, unspecified: Secondary | ICD-10-CM | POA: Diagnosis not present

## 2016-09-18 DIAGNOSIS — I2510 Atherosclerotic heart disease of native coronary artery without angina pectoris: Secondary | ICD-10-CM | POA: Diagnosis not present

## 2016-09-18 DIAGNOSIS — K869 Disease of pancreas, unspecified: Secondary | ICD-10-CM | POA: Diagnosis not present

## 2016-09-22 ENCOUNTER — Telehealth: Payer: Self-pay | Admitting: *Deleted

## 2016-09-22 DIAGNOSIS — I509 Heart failure, unspecified: Secondary | ICD-10-CM | POA: Diagnosis not present

## 2016-09-22 DIAGNOSIS — I2510 Atherosclerotic heart disease of native coronary artery without angina pectoris: Secondary | ICD-10-CM | POA: Diagnosis not present

## 2016-09-22 DIAGNOSIS — E039 Hypothyroidism, unspecified: Secondary | ICD-10-CM | POA: Diagnosis not present

## 2016-09-22 DIAGNOSIS — R627 Adult failure to thrive: Secondary | ICD-10-CM | POA: Diagnosis not present

## 2016-09-22 NOTE — Telephone Encounter (Signed)
-----   Message from [place] sent at 09/22/2016 10:38 AM EST ----- Regarding: Should I change lab only to lab/md next Tuesday Patient had to cx a few appts due to surgery and being inpatient. She is on for lab 09/29/16 with md/poss venofer 10/06/16. Do you want me to change lab only on 1/2 to lab/md since she had to cx several previous appts? Thanks.

## 2016-09-22 NOTE — Telephone Encounter (Signed)
Caregiver inquiring whether to keep apts next week as scheduled in cancer center. Family Asking if Dr. B needs to see pt next week per Mickel Baas in cancer ctr scheduling.   Pt down for lab only for 1/2. Pt currently resides at [facility].  Clinical decision made by RN to have pt see md next week @ [facility]. Lab apt since pt's has had prolonged admissions @ [facility] since last visit.  Chart reviewed in care everywhere. Pt was d/c from [facility] on 12/22. On note-pt had a Palliative care consult while in [facility].  At the time of [facility] d/c, pt was undecided on hospice care at time of hp discharge. Per note from [facility], pt was "amenable to palliative measures, she was not ready to go full comfort care." d/c summary mentioned that the patient was interested in transferring care to [facility] and that the "pt's son was attempting to do this."  Per d/c summary, pt was scheduled for apt @ [facility] on 10/23/15 in IR to f/u for her biliary drain.  Given the nature of pt's recent [facility] admission @ [facility], I felt it was reasonable to have pt be evaluated by Dr. B next Tuesday 09/30/15 in [facility] clinic for lab/md only.  Keep venofer as scheduled on 1/9 for now - pending lab results.  This was communicated back to Mickel Baas in our scheduling dept to arrange for these apts.

## 2016-09-28 DIAGNOSIS — E039 Hypothyroidism, unspecified: Secondary | ICD-10-CM | POA: Diagnosis not present

## 2016-09-28 DIAGNOSIS — E640 Sequelae of protein-calorie malnutrition: Secondary | ICD-10-CM | POA: Diagnosis not present

## 2016-09-28 DIAGNOSIS — F5101 Primary insomnia: Secondary | ICD-10-CM | POA: Diagnosis not present

## 2016-09-28 DIAGNOSIS — F419 Anxiety disorder, unspecified: Secondary | ICD-10-CM | POA: Diagnosis not present

## 2016-09-28 DIAGNOSIS — F3289 Other specified depressive episodes: Secondary | ICD-10-CM | POA: Diagnosis not present

## 2016-09-28 DIAGNOSIS — M6281 Muscle weakness (generalized): Secondary | ICD-10-CM | POA: Diagnosis not present

## 2016-09-28 DIAGNOSIS — K8689 Other specified diseases of pancreas: Secondary | ICD-10-CM | POA: Diagnosis not present

## 2016-09-28 DIAGNOSIS — Z5189 Encounter for other specified aftercare: Secondary | ICD-10-CM | POA: Diagnosis not present

## 2016-09-28 DIAGNOSIS — K219 Gastro-esophageal reflux disease without esophagitis: Secondary | ICD-10-CM | POA: Diagnosis not present

## 2016-09-28 DIAGNOSIS — F509 Eating disorder, unspecified: Secondary | ICD-10-CM | POA: Diagnosis not present

## 2016-09-28 DIAGNOSIS — I509 Heart failure, unspecified: Secondary | ICD-10-CM | POA: Diagnosis not present

## 2016-09-29 ENCOUNTER — Inpatient Hospital Stay: Payer: PPO

## 2016-09-29 ENCOUNTER — Inpatient Hospital Stay: Payer: PPO | Attending: Internal Medicine | Admitting: Internal Medicine

## 2016-09-29 VITALS — BP 95/61 | HR 105 | Temp 99.2°F | Wt 104.8 lb

## 2016-09-29 DIAGNOSIS — G8929 Other chronic pain: Secondary | ICD-10-CM | POA: Insufficient documentation

## 2016-09-29 DIAGNOSIS — Z800 Family history of malignant neoplasm of digestive organs: Secondary | ICD-10-CM | POA: Diagnosis not present

## 2016-09-29 DIAGNOSIS — R5383 Other fatigue: Secondary | ICD-10-CM | POA: Insufficient documentation

## 2016-09-29 DIAGNOSIS — Z7952 Long term (current) use of systemic steroids: Secondary | ICD-10-CM | POA: Insufficient documentation

## 2016-09-29 DIAGNOSIS — C787 Secondary malignant neoplasm of liver and intrahepatic bile duct: Secondary | ICD-10-CM | POA: Insufficient documentation

## 2016-09-29 DIAGNOSIS — Z87891 Personal history of nicotine dependence: Secondary | ICD-10-CM | POA: Diagnosis not present

## 2016-09-29 DIAGNOSIS — D509 Iron deficiency anemia, unspecified: Secondary | ICD-10-CM | POA: Diagnosis not present

## 2016-09-29 DIAGNOSIS — Z9884 Bariatric surgery status: Secondary | ICD-10-CM | POA: Insufficient documentation

## 2016-09-29 DIAGNOSIS — R978 Other abnormal tumor markers: Secondary | ICD-10-CM | POA: Diagnosis not present

## 2016-09-29 DIAGNOSIS — D500 Iron deficiency anemia secondary to blood loss (chronic): Secondary | ICD-10-CM

## 2016-09-29 DIAGNOSIS — R110 Nausea: Secondary | ICD-10-CM | POA: Insufficient documentation

## 2016-09-29 DIAGNOSIS — K831 Obstruction of bile duct: Secondary | ICD-10-CM | POA: Insufficient documentation

## 2016-09-29 DIAGNOSIS — Z7189 Other specified counseling: Secondary | ICD-10-CM

## 2016-09-29 DIAGNOSIS — N183 Chronic kidney disease, stage 3 (moderate): Secondary | ICD-10-CM | POA: Diagnosis not present

## 2016-09-29 DIAGNOSIS — Z801 Family history of malignant neoplasm of trachea, bronchus and lung: Secondary | ICD-10-CM | POA: Insufficient documentation

## 2016-09-29 DIAGNOSIS — R634 Abnormal weight loss: Secondary | ICD-10-CM | POA: Diagnosis not present

## 2016-09-29 DIAGNOSIS — K219 Gastro-esophageal reflux disease without esophagitis: Secondary | ICD-10-CM | POA: Insufficient documentation

## 2016-09-29 DIAGNOSIS — I129 Hypertensive chronic kidney disease with stage 1 through stage 4 chronic kidney disease, or unspecified chronic kidney disease: Secondary | ICD-10-CM | POA: Insufficient documentation

## 2016-09-29 DIAGNOSIS — C250 Malignant neoplasm of head of pancreas: Secondary | ICD-10-CM | POA: Insufficient documentation

## 2016-09-29 DIAGNOSIS — Z803 Family history of malignant neoplasm of breast: Secondary | ICD-10-CM | POA: Insufficient documentation

## 2016-09-29 DIAGNOSIS — M545 Low back pain: Secondary | ICD-10-CM | POA: Diagnosis not present

## 2016-09-29 DIAGNOSIS — M810 Age-related osteoporosis without current pathological fracture: Secondary | ICD-10-CM | POA: Diagnosis not present

## 2016-09-29 DIAGNOSIS — Z79899 Other long term (current) drug therapy: Secondary | ICD-10-CM | POA: Insufficient documentation

## 2016-09-29 DIAGNOSIS — K8689 Other specified diseases of pancreas: Secondary | ICD-10-CM

## 2016-09-29 DIAGNOSIS — R630 Anorexia: Secondary | ICD-10-CM | POA: Insufficient documentation

## 2016-09-29 LAB — CBC WITH DIFFERENTIAL/PLATELET
BASOS ABS: 0.1 10*3/uL (ref 0–0.1)
BASOS PCT: 1 %
EOS ABS: 0.2 10*3/uL (ref 0–0.7)
Eosinophils Relative: 3 %
HCT: 33.7 % — ABNORMAL LOW (ref 35.0–47.0)
HEMOGLOBIN: 11 g/dL — AB (ref 12.0–16.0)
Lymphocytes Relative: 14 %
Lymphs Abs: 1 10*3/uL (ref 1.0–3.6)
MCH: 29.3 pg (ref 26.0–34.0)
MCHC: 32.7 g/dL (ref 32.0–36.0)
MCV: 89.7 fL (ref 80.0–100.0)
MONOS PCT: 7 %
Monocytes Absolute: 0.5 10*3/uL (ref 0.2–0.9)
NEUTROS PCT: 75 %
Neutro Abs: 5.6 10*3/uL (ref 1.4–6.5)
Platelets: 374 10*3/uL (ref 150–440)
RBC: 3.76 MIL/uL — ABNORMAL LOW (ref 3.80–5.20)
RDW: 17 % — ABNORMAL HIGH (ref 11.5–14.5)
WBC: 7.4 10*3/uL (ref 3.6–11.0)

## 2016-09-29 LAB — IRON AND TIBC
IRON: 27 ug/dL — AB (ref 28–170)
Saturation Ratios: 13 % (ref 10.4–31.8)
TIBC: 203 ug/dL — ABNORMAL LOW (ref 250–450)
UIBC: 176 ug/dL

## 2016-09-29 LAB — FERRITIN: FERRITIN: 642 ng/mL — AB (ref 11–307)

## 2016-09-29 NOTE — Assessment & Plan Note (Addendum)
Approximately 4 cm pancreatic head mass; with multiple lesions in the liver highly suspicious for pancreatic malignancy. Elevated Ca 19-9. Unable to get a biopsy because of location.  # Most certainly this is pancreatic adenocarcinoma- given the patient's poor performance status; reluctance with chemotherapy- I agree that a biopsy will not change the management. Discussed at length the natural history of metastatic pancreatic cancer. I recommend hospice. Discussed the hospice philosophy.   # Obstructive jaundice- unable to do ERCP with stenting because of previous gastric surgery. Status post  external drain.  # Poor appetite-recommend increasing to Marinol 5 mg twice a day.  # Discussed with Dr.Bronstein; recommend re: transitioning to hospice.  # 40 minutes face-to-face with the patient discussing the above plan of care; more than 50% of time spent on prognosis/ natural history; counseling and coordination.

## 2016-09-29 NOTE — Progress Notes (Signed)
[place] NOTE  Patient Care Team: Crecencio Mc, MD as PCP - General (Internal Medicine)  CHIEF COMPLAINTS/PURPOSE OF CONSULTATION:   # NOV 2017- PANCREATIC HEAD MASS [~4cm]; multiple liver lesions  #  IRON DEFICIENCY ANEMIA [SEP 2017-Dr.Elliot-EGD/Colo-Negative; Stool occult Positive]  # "Gastric Bypass"   No history exists.     HISTORY OF PRESENTING ILLNESS:  Meagan Zavala 71 y.o.  female most likely pancreatic adenocarcinoma with metastasis to the liver is here for follow-up.  Patient had been evaluated at [facility]- unable to have ERCP with stent because of previous gastric bypass surgery. She went on to have an external drain; with improvement of the jaundice. She did not have a biopsy. She is currently in a rehabilitation.   She feels poorly. Complains of significant fatigue. Positive for nausea no vomiting. Positive for weight loss. Poor appetite. Back pain significant improved since having the celiac plexus block.  No unusual swelling legs or arms. No unusual chest pain or shortness of breath or cough  ROS: A complete 10 point review of system is done which is negative except mentioned above in history of present illness  MEDICAL HISTORY:  Past Medical History:  Diagnosis Date  . Anemia   . Arthritis   . Basal cell carcinoma of face   . BULIMIA many decades of this   patient keeps alive by drinking ensure that she cannot purge  . GERD (gastroesophageal reflux disease)   . Hypercholesteremia   . KIDNEY DISEASE, CHRONIC, STAGE III 2010   Cr 1.3 -> .84, probably from NSAID's and lasix  . LEAKAGE, CONTINUOUS URINE 07/30/2009  . LOW BACK PAIN, CHRONIC    takes tylenol  . Macular degeneration disease    [place], now [place]  . Major depressive disorder, recurrent episode, severe ([place])    well treated with SSRI's  . OSTEOPOROSIS    severe, treated with infusions, followed by endocrine in [place]  . Personal history of  tobacco use, presenting hazards to health 08/11/2016  . Wears dentures    full upper and lower    SURGICAL HISTORY: Past Surgical History:  Procedure Laterality Date  . CARPAL TUNNEL RELEASE    . CATARACT EXTRACTION    . COLON SURGERY  1980   partial colectomy  . COLONOSCOPY WITH PROPOFOL N/A 06/10/2016   Procedure: COLONOSCOPY WITH PROPOFOL;  Surgeon: Manya Silvas, MD;  Location[facility] ENDOSCOPY;  Service: Endoscopy;  Laterality: N/A;  . ECTROPION REPAIR Bilateral 08/13/2015   Procedure: REPAIR OF ECTROPION;  Surgeon: Karle Starch, MD;  Location: [place];  Service: Ophthalmology;  Laterality: Bilateral;  . ESOPHAGOGASTRODUODENOSCOPY N/A 09/02/2016   Procedure: ESOPHAGOGASTRODUODENOSCOPY (EGD);  Surgeon: Jonathon Bellows, MD;  Location: [facility] ENDOSCOPY;  Service: Endoscopy;  Laterality: N/A;  . ESOPHAGOGASTRODUODENOSCOPY (EGD) WITH PROPOFOL N/A 06/10/2016   Procedure: ESOPHAGOGASTRODUODENOSCOPY (EGD) WITH PROPOFOL;  Surgeon: Manya Silvas, MD;  Location: [facility] ENDOSCOPY;  Service: Endoscopy;  Laterality: N/A;  . 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 PTOSIS REPAIR Bilateral 08/13/2015   Procedure: PTOSIS REPAIR;  Surgeon: Karle Starch, MD;  Location: [place];  Service: Ophthalmology;  Laterality: Bilateral;  . SURGICAL EXCISION OF EXCESSIVE SKIN  1979  . TUBAL LIGATION      SOCIAL HISTORY: Social History   Social History  . Marital status: Single    Spouse name: N/A  . Number of children: N/A  . Years of education: N/A  Occupational History  . Not on file.   Social History Main Topics  . Smoking status: Former Smoker    Packs/day: 1.50    Years: 40.00    Quit date: 10/02/2010  . Smokeless tobacco: Never Used  . 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 Drug use: No  . Sexual activity: Not on file   Other Topics Concern  . Not on file   Social History Narrative   Patient has been living alone.  She is on disability. She goes to church and has support there. She has a very supportive cousin, Nena Maxson: [phone].    FAMILY HISTORY: Family History  Problem Relation Age of Onset  . Cancer Mother     breast,   . Breast cancer Mother   . Cancer Father     lung  . Cancer Sister     lung, brain  . Cancer Brother     throat    ALLERGIES:  has No Known Allergies.  MEDICATIONS:  Current Outpatient Prescriptions  Medication Sig Dispense Refill  . BIOTIN PO Take by mouth.    . calcium citrate-vitamin D 200-200 MG-UNIT TABS Take 3 tablets by mouth daily.     . clonazePAM (KLONOPIN) 0.5 MG tablet 0.25 mg 2 (two) times daily as needed.   0  . Cyanocobalamin (RA VITAMIN B-12 TR) 1000 MCG TBCR Take 1 tablet by mouth daily.     Marland Kitchen docusate sodium (COLACE) 100 MG capsule Take 100 mg by mouth 2 (two) times daily.    Marland Kitchen donepezil (ARICEPT) 10 MG tablet Take 10 mg by mouth.    . fish oil-omega-3 fatty acids 1000 MG capsule Take 1 capsule by mouth daily.    Marland Kitchen FLUoxetine HCl 60 MG TABS Take 1 tablet by mouth daily.    Marland Kitchen gabapentin (NEURONTIN) 100 MG capsule Take 100 mg by mouth 4 (four) times daily.    Marland Kitchen HYDROcodone-acetaminophen (NORCO) 10-325 MG tablet Take 1 tablet by mouth every 6 (six) hours as needed. As needed for severe pain 12 tablet 0  . meloxicam (MOBIC) 7.5 MG tablet Take 7.5 mg by mouth daily as needed for pain.    Marland Kitchen metoCLOPramide (REGLAN) 10 MG tablet Take 1 tablet (10 mg total) by mouth every 8 (eight) hours as needed for nausea or vomiting. 30 tablet 1  . mirtazapine (REMERON) 15 MG tablet Take 1 tablet (15 mg total) by mouth at bedtime. 90 tablet 1  . Multiple Vitamins-Minerals (ICAPS PO) Take by mouth.    Marland Kitchen omeprazole (PRILOSEC) 40 MG capsule TAKE ONE CAPSULE BY MOUTH ONCE DAILY 90 capsule 1  . oxybutynin (DITROPAN XL) 15 MG 24 hr tablet Take 1 tablet (15 mg total) by mouth at bedtime. 90 tablet 2  . pravastatin (PRAVACHOL) 20 MG tablet TAKE ONE TABLET BY MOUTH ONCE  DAILY 90 tablet 3  . predniSONE (DELTASONE) 10 MG tablet Take 2 tablets (20 mg total) by mouth daily. 6 tablet 0  . promethazine (PHENERGAN) 25 MG tablet Take 1 tablet (25 mg total) by mouth every 6 (six) hours as needed for nausea or vomiting. 20 tablet 0  . SYNTHROID 25 MCG tablet Take 25 mcg by mouth daily before breakfast.     . traMADol (ULTRAM) 50 MG tablet Take 1 tablet (50 mg total) by mouth every 6 (six) hours as needed for severe pain. 15 tablet 0  . traZODone (DESYREL) 50 MG tablet Take  50-100 mg by mouth at bedtime as needed for sleep.    . Vitamin D, Ergocalciferol, (DRISDOL) 50000 UNITS CAPS Take 50,000 Units by mouth every 14 (fourteen) days.    . vitamin E 400 UNIT capsule Take 400 Units by mouth daily. Reported on 04/07/2016    . zoledronic acid (RECLAST) 5 MG/100ML SOLN Every year.     No current facility-administered medications for this visit.       Marland Kitchen  PHYSICAL EXAMINATION: ECOG PERFORMANCE STATUS: 3 - Symptomatic, >50% confined to bed  Vitals:   09/29/16 1403  BP: 95/61  Pulse: (!) 105  Temp: 99.2 F (37.3 C)   Filed Weights   09/29/16 1403  Weight: 104 lb 13.3 oz (47.6 kg)    GENERAL: Thin built moderately nourished Caucasian female patient Alert, no distress and comfortable. Accompanied by a friend; nephew Ivin Booty. EYES: no pallor or icterus OROPHARYNX: no thrush or ulceration; good dentition  NECK: supple, no masses felt LYMPH:  no palpable lymphadenopathy in the cervical, axillary or inguinal regions LUNGS: clear to auscultation and  No wheeze or crackles HEART/CVS: regular rate & rhythm and no murmurs; No lower extremity edema ABDOMEN: abdomen soft, non-tender and normal bowel sounds Musculoskeletal:no cyanosis of digits and no clubbing  PSYCH: alert & oriented x 3 with fluent speech NEURO: no focal motor/sensory deficits SKIN:  no rashes or significant lesions  LABORATORY DATA:  I have reviewed the data as listed Lab Results  Component Value  Date   WBC 7.4 09/29/2016   HGB 11.0 (L) 09/29/2016   HCT 33.7 (L) 09/29/2016   MCV 89.7 09/29/2016   PLT 374 09/29/2016    Recent Labs  08/29/16 1629 08/30/16 0437 09/03/16 0812  NA 135 139 136  K 4.0 3.7 3.2*  CL 101 110 104  CO2 25 24 23   GLUCOSE 99 73 95  BUN 17 12 9   CREATININE 0.88 0.68 0.55  CALCIUM 9.5 8.9 8.8*  GFRNONAA >60 >60 >60  GFRAA >60 >60 >60  PROT 6.9 6.3* 6.1*  ALBUMIN 3.5 3.1* 2.8*  AST 386* 418* 168*  ALT 493* 453* 241*  ALKPHOS 337* 359* 352*  BILITOT 0.5 1.8* 3.9*  BILIDIR 0.3  --   --   IBILI 0.2*  --   --     RADIOGRAPHIC STUDIES: I have personally reviewed the radiological images as listed and agreed with the findings in the report. US Abdomen Limited Ruq  Result Date: 09/03/2016 CLINICAL DATA:  Abnormal LFTs with abdominal pain. EXAM: US ABDOMEN LIMITED - RIGHT UPPER QUADRANT COMPARISON:  Ultrasound exam from 2 days ago. PET-CT 08/17/2016. Abdominal CT 08/17/2016. FINDINGS: Gallbladder: Surgically absent. Common bile duct: Diameter: Intrahepatic biliary duct dilatation associated with 1.5 cm diameter common duct. Liver: Echogenic liver parenchyma with focal liver lesions again noted. IMPRESSION: No substantial change. Intra and extrahepatic biliary duct dilatation with multiple liver masses evident. Electronically Signed   By: Misty Stanley M.D.   On: 09/03/2016 12:39   US Abdomen Limited Ruq  Result Date: 09/01/2016 CLINICAL DATA:  Abnormal LFTs. EXAM: US ABDOMEN LIMITED - RIGHT UPPER QUADRANT COMPARISON:  PET-CT 08/25/2016. FINDINGS: Gallbladder: Cholecystectomy. Common bile duct: Diameter: 12.9 mm. Common bile duct is dilated. Mild intrahepatic biliary ductal dilatation . Liver: Multiple hepatic lesions again noted. Largest in the right lobe is 2.8 cm in maximum diameter. Findings are consistent with metastatic disease . IMPRESSION: 1. Dilated common bile duct at 12.9 mm. Mild intrahepatic biliary ductal dilatation. Prior cholecystectomy. 2.  Multiple hepatic  lesions consistent with metastatic disease again noted. Largest lesion is in the right lobe measures 2.8 cm in maximum diameter. Electronically Signed   By: Marcello Moores  Register   On: 09/01/2016 09:03    ASSESSMENT & PLAN:   Pancreatic mass Approximately 4 cm pancreatic head mass; with multiple lesions in the liver highly suspicious for pancreatic malignancy. Elevated Ca 19-9. Unable to get a biopsy because of location.  # Most certainly this is pancreatic adenocarcinoma- given the patient's poor performance status; reluctance with chemotherapy- I agree that a biopsy will not change the management. Discussed at length the natural history of metastatic pancreatic cancer. I recommend hospice. Discussed the hospice philosophy.   # Obstructive jaundice- unable to do ERCP with stenting because of previous gastric surgery. Status post  external drain.  # Poor appetite-recommend increasing to Marinol 5 mg twice a day.  # Discussed with Dr.Bronstein; recommend re: transitioning to hospice.  # 40 minutes face-to-face with the patient discussing the above plan of care; more than 50% of time spent on prognosis/ natural history; counseling and coordination.   All questions were answered. The patient knows to call the clinic with any problems, questions or concerns.   Cammie Sickle, MD 09/29/2016 5:17 PM

## 2016-09-29 NOTE — Progress Notes (Signed)
Patient here today for follow up.  Patient has dark/orange urine, requesting fluids today

## 2016-10-01 DIAGNOSIS — E039 Hypothyroidism, unspecified: Secondary | ICD-10-CM | POA: Diagnosis not present

## 2016-10-01 DIAGNOSIS — I509 Heart failure, unspecified: Secondary | ICD-10-CM | POA: Diagnosis not present

## 2016-10-01 DIAGNOSIS — D378 Neoplasm of uncertain behavior of other specified digestive organs: Secondary | ICD-10-CM | POA: Diagnosis not present

## 2016-10-01 DIAGNOSIS — R627 Adult failure to thrive: Secondary | ICD-10-CM | POA: Diagnosis not present

## 2016-10-01 DIAGNOSIS — I2510 Atherosclerotic heart disease of native coronary artery without angina pectoris: Secondary | ICD-10-CM | POA: Diagnosis not present

## 2016-10-01 DIAGNOSIS — K219 Gastro-esophageal reflux disease without esophagitis: Secondary | ICD-10-CM | POA: Diagnosis not present

## 2016-10-01 DIAGNOSIS — K5900 Constipation, unspecified: Secondary | ICD-10-CM | POA: Diagnosis not present

## 2016-10-01 DIAGNOSIS — D376 Neoplasm of uncertain behavior of liver, gallbladder and bile ducts: Secondary | ICD-10-CM | POA: Diagnosis not present

## 2016-10-02 ENCOUNTER — Other Ambulatory Visit: Payer: Self-pay | Admitting: *Deleted

## 2016-10-02 ENCOUNTER — Telehealth: Payer: Self-pay | Admitting: Internal Medicine

## 2016-10-02 NOTE — Patient Outreach (Signed)
[facility]) Care Management  10/02/2016  Meagan Zavala [birth year] [id number]   Patient screened for [facility] program services as she is at [facility] resources for short term rehab. Spoke with Donnalee Curry, SW at facility.  She states that patient will be discharged to hospice.  She may remain at facility under Hospice care or go home with Hospice, no decision has been made yet.  No [facility] care management needs assessed at this time.  Plan to sign off. Royetta Crochet. Laymond Purser, RN, BSN, CCM  [facility] coordinator [phone]

## 2016-10-02 NOTE — Telephone Encounter (Signed)
Shawn from [facility] called and needs a follow up in 1 -2 weeks post rehab. Pt has a new dx of cancer. She is being released on 10/07/16. Please advise, thank you!  Call Shawn @ [phone] (may leave vm)  Call pt @ [phone]

## 2016-10-05 NOTE — Telephone Encounter (Signed)
Can reach patient at [phone]. Patient scheduled for 10/16/16 at 11.30.

## 2016-10-06 ENCOUNTER — Telehealth: Payer: Self-pay | Admitting: Internal Medicine

## 2016-10-06 ENCOUNTER — Ambulatory Visit: Payer: PPO

## 2016-10-06 ENCOUNTER — Telehealth: Payer: Self-pay | Admitting: *Deleted

## 2016-10-06 ENCOUNTER — Ambulatory Visit: Payer: PPO | Admitting: Internal Medicine

## 2016-10-06 ENCOUNTER — Other Ambulatory Visit: Payer: PPO

## 2016-10-06 NOTE — Telephone Encounter (Signed)
Pt nephew called with a concern/request about her pain medication. Please call him: [phone]

## 2016-10-06 NOTE — Telephone Encounter (Signed)
Patient is being discharged form [facility] tomorrow and family is requesting services from Hospice and palliative Care, Needs order, ON, Demographics faxed to [phone] asap

## 2016-10-06 NOTE — Telephone Encounter (Signed)
As per other phone note: Order, OV note and demographics faxed to hospice as requested

## 2016-10-06 NOTE — Telephone Encounter (Signed)
Order, OV note and demographics faxed to Hospice as requested

## 2016-10-08 ENCOUNTER — Telehealth: Payer: Self-pay | Admitting: *Deleted

## 2016-10-08 DIAGNOSIS — C250 Malignant neoplasm of head of pancreas: Secondary | ICD-10-CM

## 2016-10-08 NOTE — Telephone Encounter (Signed)
Attempted TCM call today patient discharge from [facility] on 10/07/16, no answer or voicemail.

## 2016-10-08 NOTE — Telephone Encounter (Signed)
Patient decline home health due to insurance. Patient has cancer and has decided to  contact hospice

## 2016-10-08 NOTE — Telephone Encounter (Signed)
Hospice referral made. 

## 2016-10-08 NOTE — Telephone Encounter (Signed)
Merry Proud from [facility] reported that he went to patient home to start home health services,however the pt declined the services and requested to have hospice.  Please Contact Merry Proud [phone]

## 2016-10-09 ENCOUNTER — Telehealth: Payer: Self-pay | Admitting: *Deleted

## 2016-10-09 ENCOUNTER — Telehealth: Payer: Self-pay | Admitting: Internal Medicine

## 2016-10-09 MED ORDER — MORPHINE SULFATE (CONCENTRATE) 10 MG /0.5 ML PO SOLN: ORAL | 0 refills | Status: AC

## 2016-10-09 NOTE — Telephone Encounter (Signed)
Lattie Haw informed that per VO Dr Rogue Bussing to flush bili drain and change dressing once a week. Message left on VM

## 2016-10-09 NOTE — Telephone Encounter (Signed)
Asking for Roxanol Rx having difficulty swallowing and her current meds are not controlling her pain (Hydrocodone, Meloxicam, Gabapentin)

## 2016-10-09 NOTE — Telephone Encounter (Signed)
FYI

## 2016-10-09 NOTE — Telephone Encounter (Signed)
Patient is being moved to Hospice will need to cancel from Rehab follow up.

## 2016-10-09 NOTE — Telephone Encounter (Signed)
FYI - Pt is already receiving hospice, the cancer center had sent in a referral.  They wanted to call to let you know and thank you for the referral.

## 2016-10-09 NOTE — Telephone Encounter (Signed)
Patient was discharged from [facility] with a biliary tube. She has no supplies and no directions of what to do with it. Please advise of any orders regarding the biliary tube. ?flush, dressing changes, leave it alone?

## 2016-10-16 ENCOUNTER — Ambulatory Visit: Payer: PPO | Admitting: Internal Medicine

## 2016-10-30 ENCOUNTER — Other Ambulatory Visit: Payer: Self-pay | Admitting: *Deleted

## 2016-10-30 MED ORDER — HYDROCODONE-ACETAMINOPHEN 10-325 MG PO TABS: 1.0000 | ORAL_TABLET | Freq: Four times a day (QID) | ORAL | 0 refills | Status: AC | PRN

## 2016-11-12 DIAGNOSIS — Z7401 Bed confinement status: Secondary | ICD-10-CM | POA: Diagnosis not present

## 2016-11-12 DIAGNOSIS — C801 Malignant (primary) neoplasm, unspecified: Secondary | ICD-10-CM | POA: Diagnosis not present

## 2016-11-25 ENCOUNTER — Telehealth: Payer: Self-pay | Admitting: *Deleted

## 2016-11-26 NOTE — Telephone Encounter (Signed)
Called to report that patient expired at 5:16 AM today

## 2016-11-26 DEATH — deceased

## 2017-01-08 ENCOUNTER — Other Ambulatory Visit: Payer: Self-pay | Admitting: Nurse Practitioner

## 2017-05-29 IMAGING — CT CT ABD-PELV W/ CM
2 of 5 series · 14 of 46 positions shown, 16 images · IV contrast (APPLIED)
Comparison: None.

CLINICAL DATA: Acute onset of left lower quadrant abdominal pain
and vomiting. Initial encounter.

EXAM:
CT ABDOMEN AND PELVIS WITH CONTRAST
TECHNIQUE: Multidetector CT imaging of the abdomen and pelvis was performed
using the standard protocol following bolus administration of
intravenous contrast.
CONTRAST:  75mL MBVGEP-HTT IOPAMIDOL (MBVGEP-HTT) INJECTION 61%

[Series 2: axial st · axial · 0.76mm/px · z∈[-218,+172]mm · 11 of 88 slices shown, 13 images]
[im 5/88  soft-tissue]
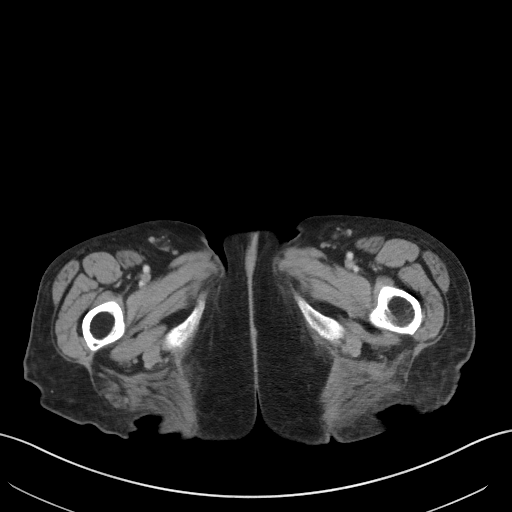
[im 5/88  bone]
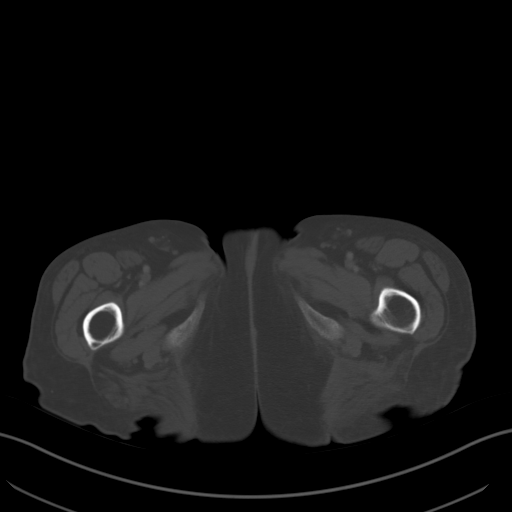
[im 14/88  soft-tissue]
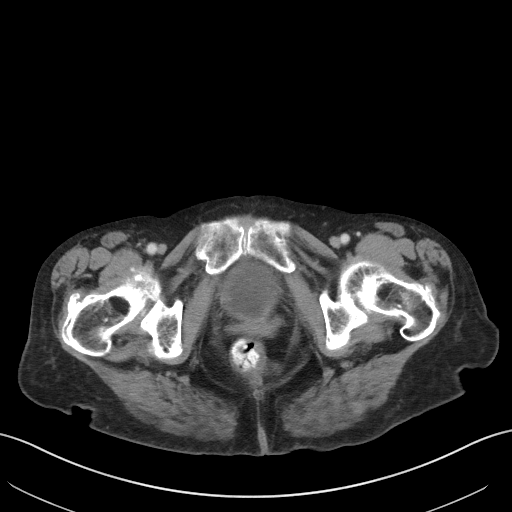
[im 23/88  soft-tissue]
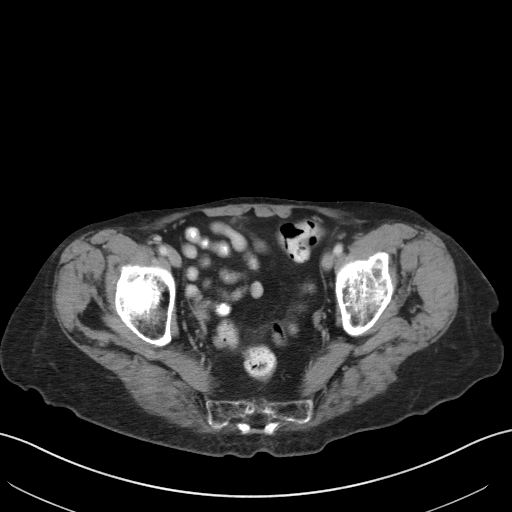
[im 28/88  soft-tissue]
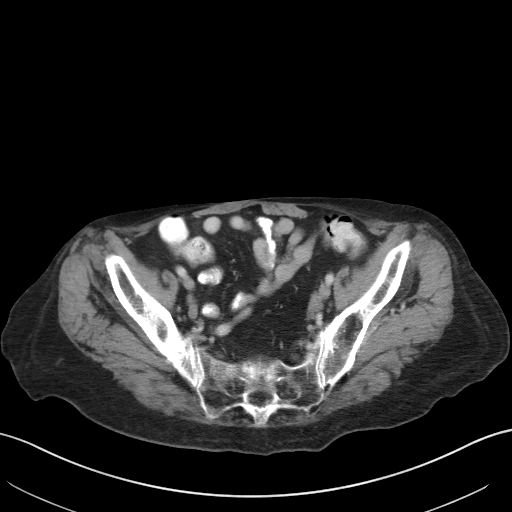
[im 37/88  soft-tissue]
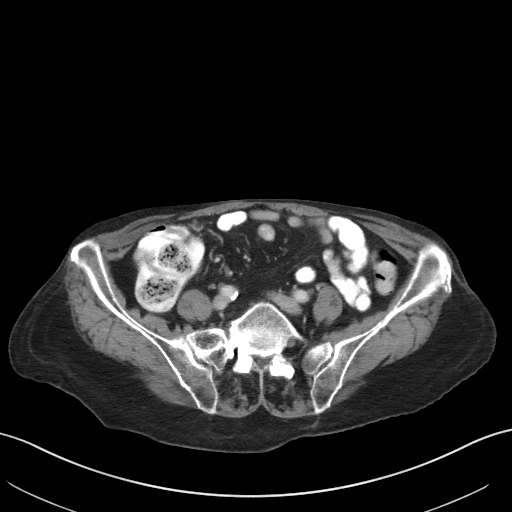
[im 46/88  soft-tissue]
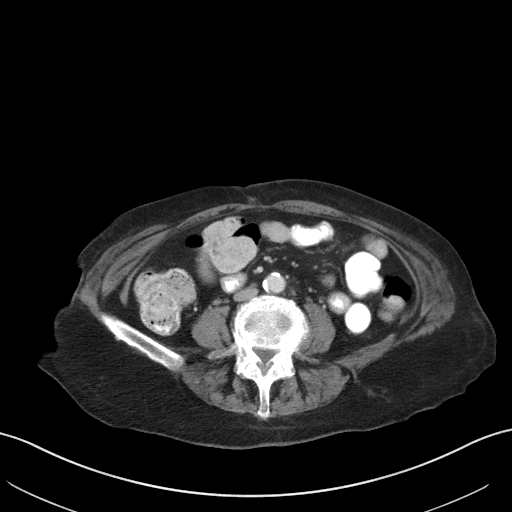
[im 51/88  soft-tissue]
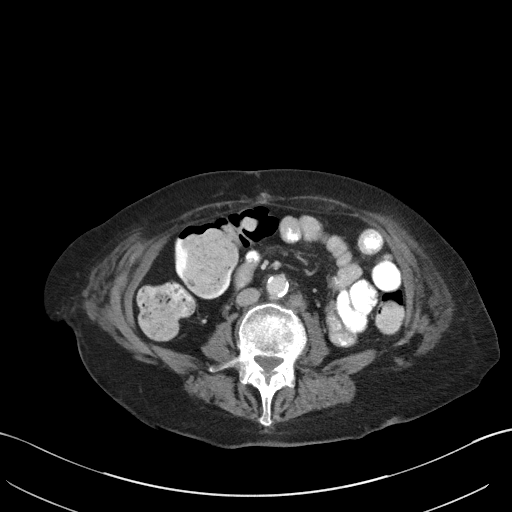
[im 60/88  soft-tissue]
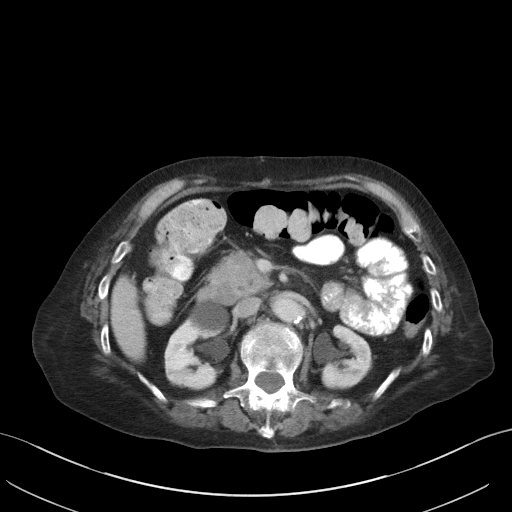
[im 65/88  soft-tissue]
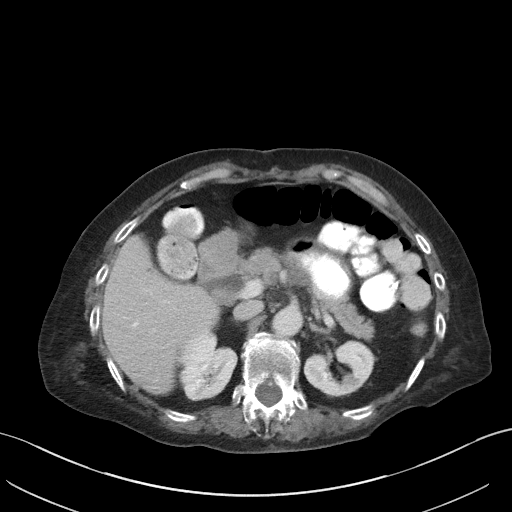
[im 65/88  bone]
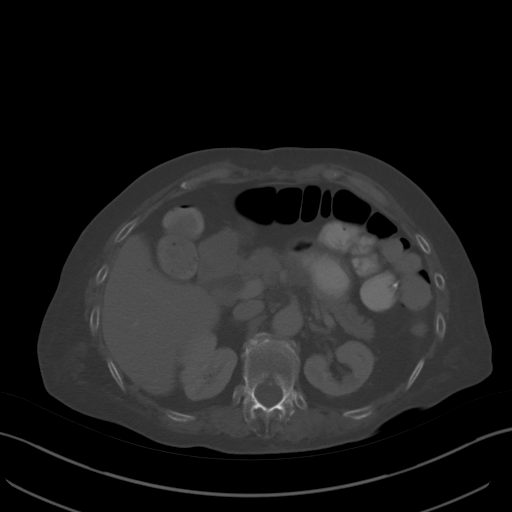
[im 74/88  soft-tissue]
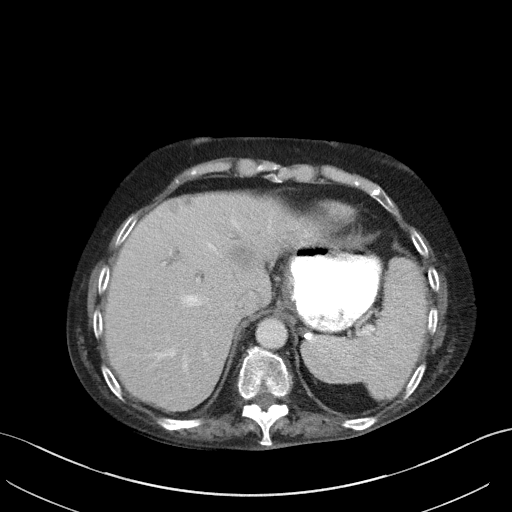
[im 83/88  soft-tissue]
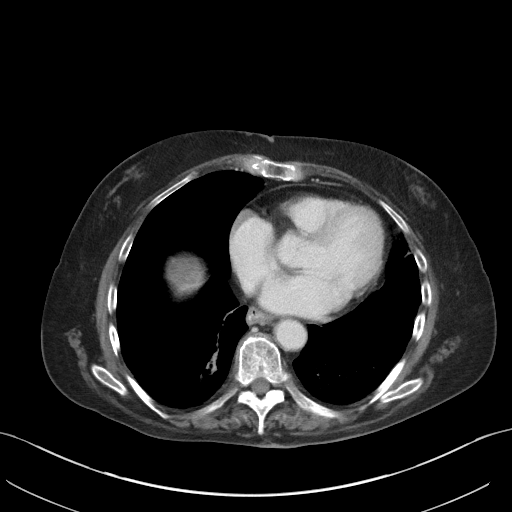

[Series 5: coronal st · coronal · 0.62mm/px · 3 of 76 slices shown]
[im 26/76  soft-tissue]
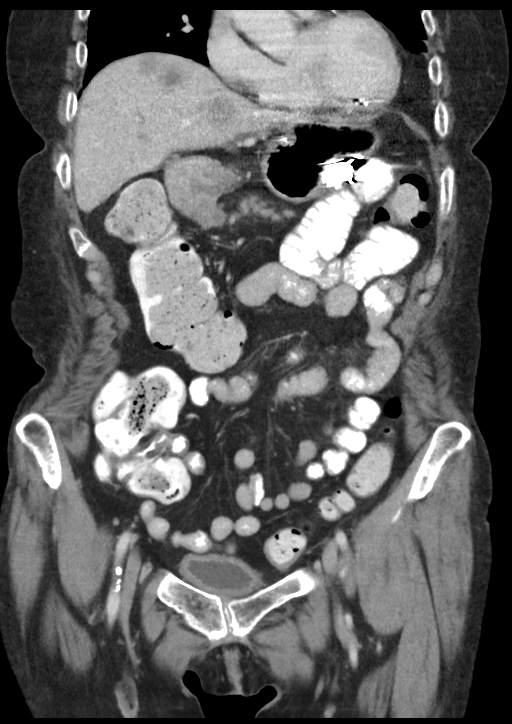
[im 34/76  soft-tissue]
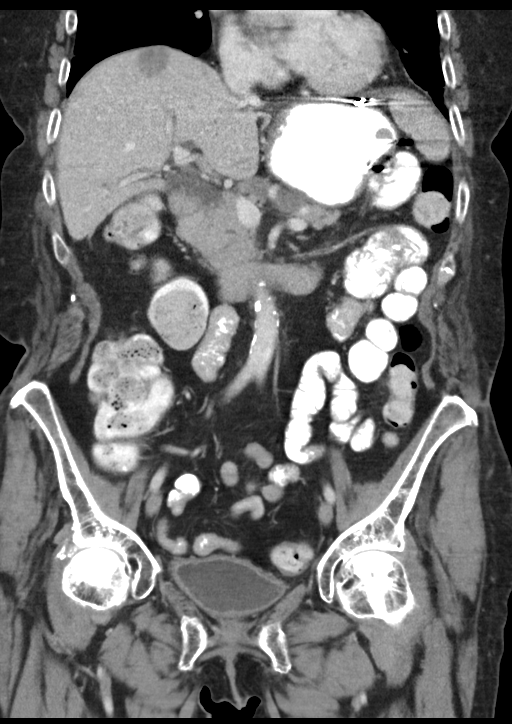
[im 42/76  soft-tissue]
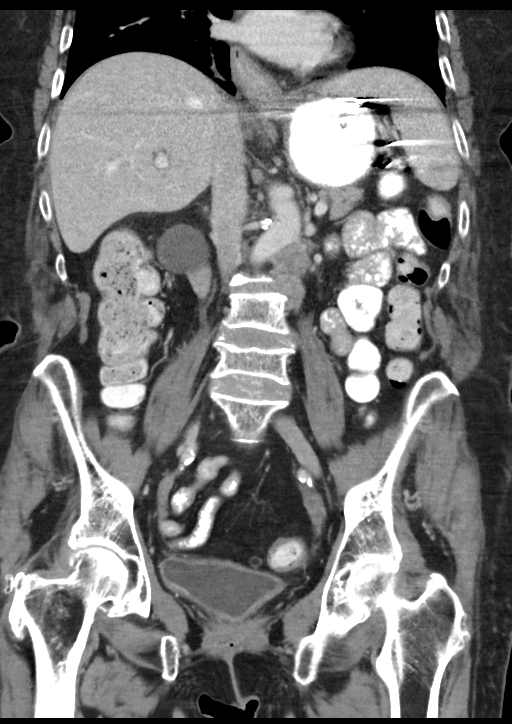

[14 of 46 positions shown; findings below may reference images not displayed]

FINDINGS: Lower chest: Mild coronary artery calcifications are seen. Bibasilar
atelectasis or scarring is noted.

Hepatobiliary: Scattered heterogeneous hypodense masses are noted
throughout the liver, measuring up to 3.2 cm in size. These are
compatible with metastatic disease. The patient is status post
cholecystectomy. The common bile duct remains normal in caliber.

Pancreas: There appears to be a large heterogeneous mass at the
pancreatic head, compressing the second segment of the duodenum,
measuring perhaps 4.9 x 3.2 x 3.2 cm. This is concerning for primary
pancreatic malignancy. MRCP is recommended for further evaluation.

Spleen: The spleen is unremarkable in appearance.

Adrenals/Urinary Tract: The adrenal glands are unremarkable in
appearance.

Mild left renal scarring is noted. Small bilateral renal cysts are
noted. There is no evidence of hydronephrosis. No renal or ureteral
stones are identified.

Stomach/Bowel: The stomach is unremarkable in appearance. The small
bowel is within normal limits. The appendix is normal in caliber,
without evidence of appendicitis. The colon is unremarkable in
appearance.

Vascular/Lymphatic: Scattered calcification is seen along the
abdominal aorta and its branches. The abdominal aorta is otherwise
grossly unremarkable. The inferior vena cava is grossly
unremarkable. No retroperitoneal lymphadenopathy is seen. No pelvic
sidewall lymphadenopathy is identified.

Reproductive: Diffuse irregular wall thickening is noted along the
bladder, raising concern for cystitis. Underlying mass cannot be
entirely excluded. The remaining uterus is grossly unremarkable. The
ovaries are grossly symmetric. No suspicious adnexal masses are
seen.

Other: No additional soft tissue abnormalities are seen.

Musculoskeletal: No acute osseous abnormalities are identified.
Multilevel chronic compression deformities are noted along the lower
thoracic and lumbar spine, from T10-L4. The visualized musculature
is unremarkable in appearance.
IMPRESSION: 1. Apparent large heterogeneous mass at the pancreatic head,
compressing the second segment of the duodenum, measuring perhaps
4.9 x 3.2 x 3.2 cm. This is concerning for primary pancreatic
malignancy. MRCP is recommended for further evaluation.
2. Scattered heterogeneous hypodense masses throughout the liver,
measuring up to 3.2 cm in size, compatible with metastatic disease.
3. Diffuse irregular wall thickening along the bladder, raising
concern for cystitis. Underlying mass cannot be entirely excluded.
Cystoscopy would be helpful for further evaluation, when and as
deemed clinically appropriate.
4. Mild left renal scarring noted. Small bilateral renal cysts seen.
5. Scattered aortic atherosclerosis.
6. Multilevel compression deformities along the lower thoracic and
lumbar spine, from T10-L4.
7. Scattered coronary artery calcifications seen.
8. Atelectasis or scarring at the lung bases.

## 2017-06-28 NOTE — Telephone Encounter (Signed)
See care note  

## 2017-08-12 ENCOUNTER — Ambulatory Visit: Payer: PPO

## 2017-09-14 IMAGING — US US ABDOMEN LIMITED
1 series · 14 of 25 positions shown · non-contrast
Comparison: Ultrasound exam from 2 days ago. PET-CT 08/17/2016.
Abdominal CT 08/17/2016.

CLINICAL DATA: Abnormal LFTs with abdominal pain.

EXAM:
US ABDOMEN LIMITED - RIGHT UPPER QUADRANT

[Series 1: us abdomen limited · 0.20mm/px · 14 of 39 slices shown]
[im 1/39]
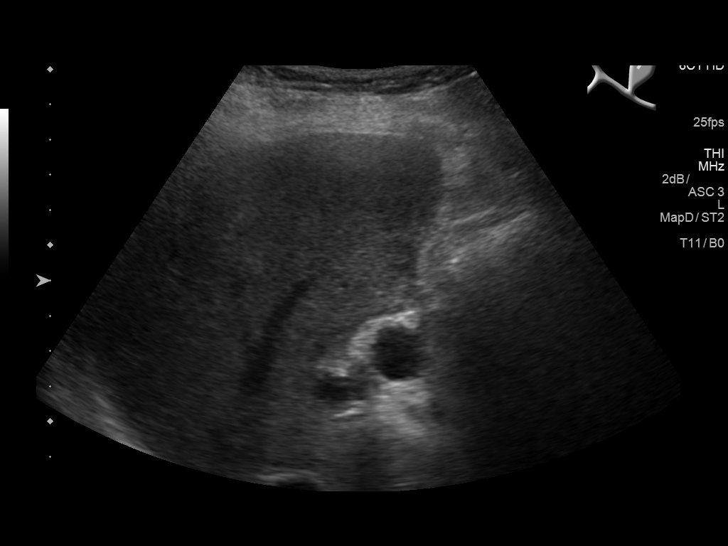
[im 4/39]
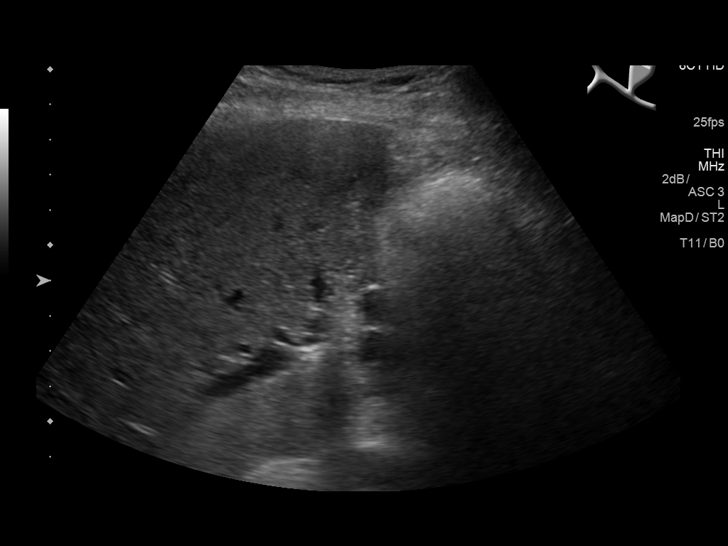
[im 7/39]
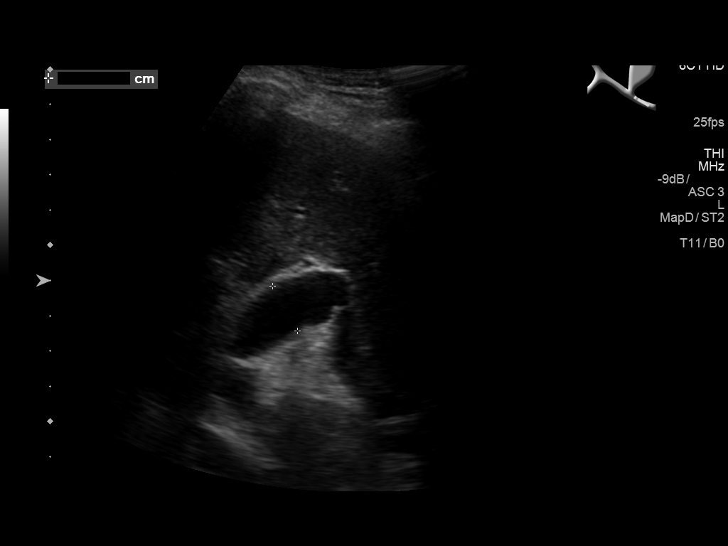
[im 10/39]
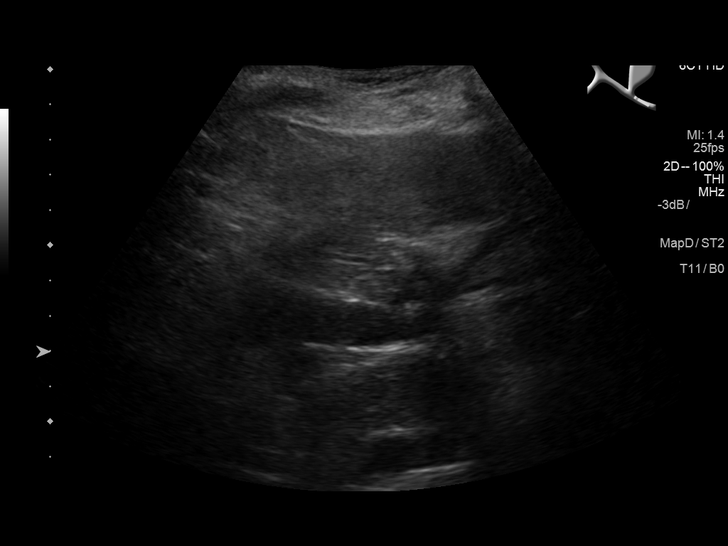
[im 13/39]
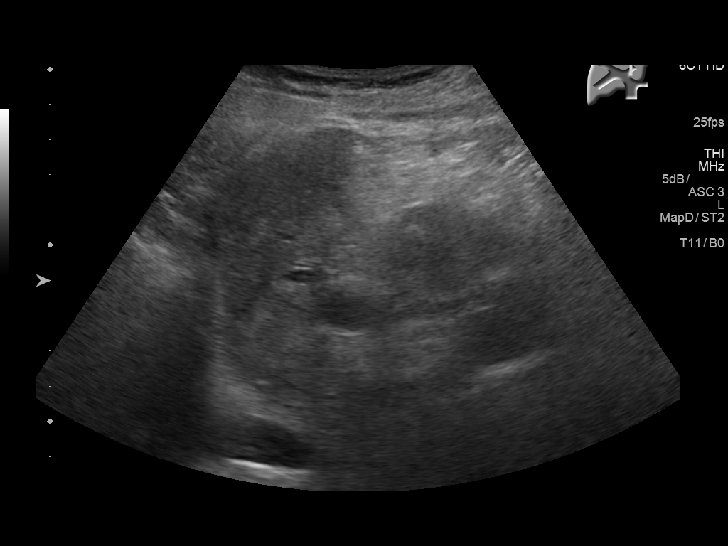
[im 15/39]
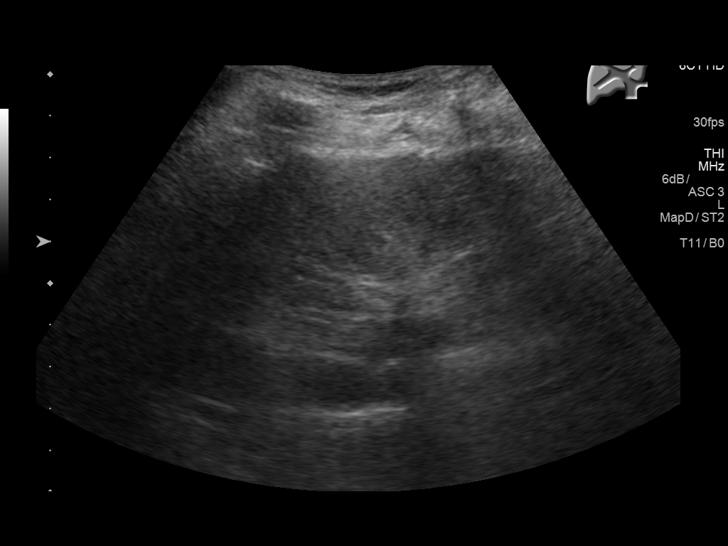
[im 18/39]
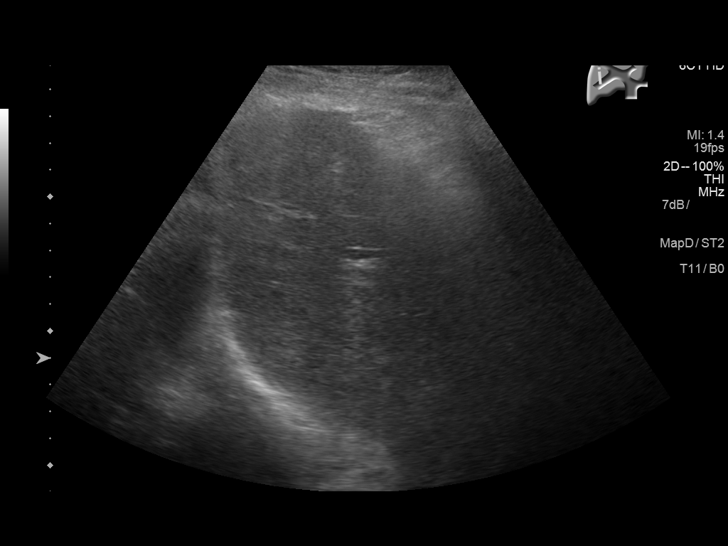
[im 21/39]
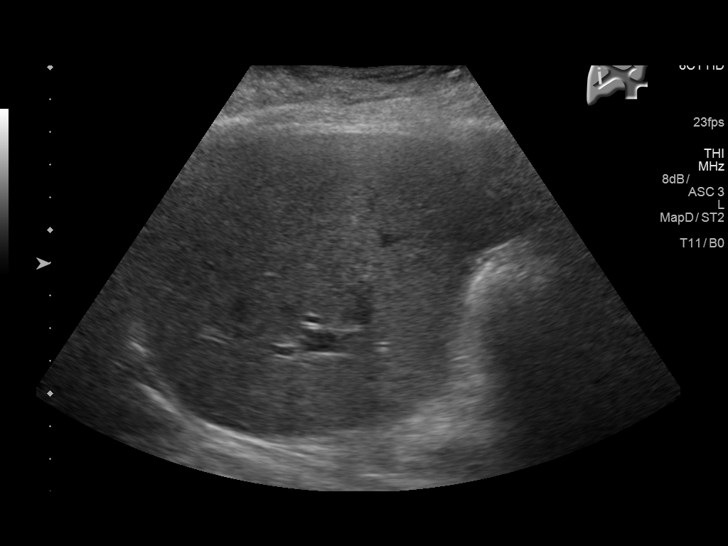
[im 24/39]
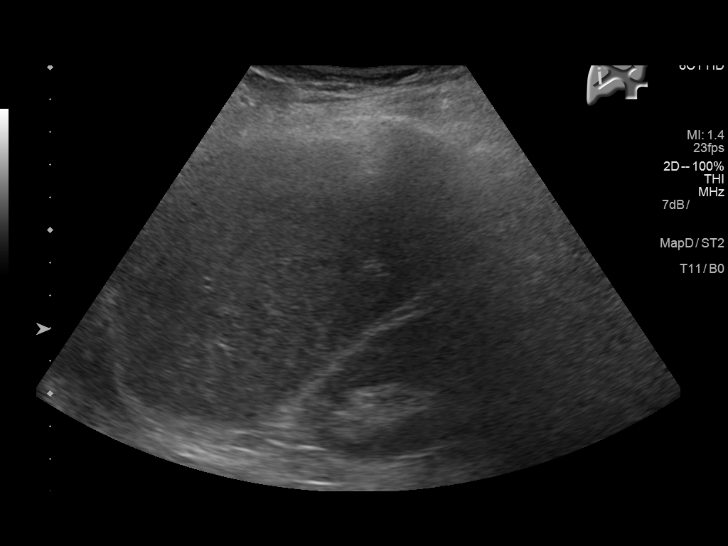
[im 26/39]
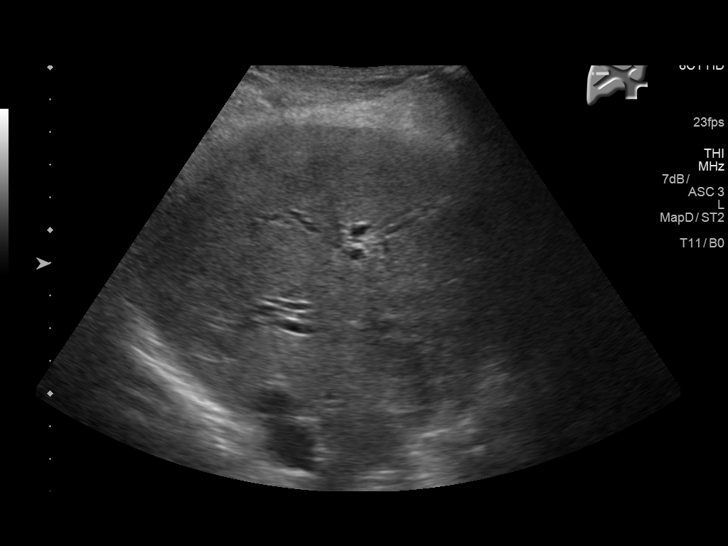
[im 29/39]
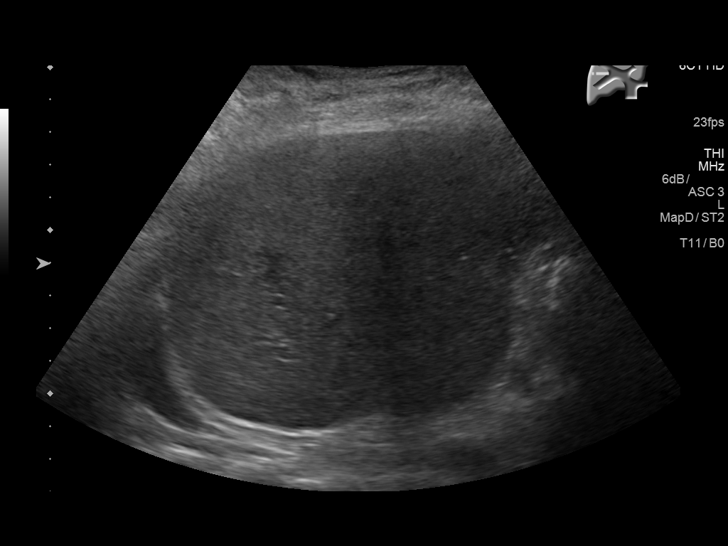
[im 32/39]
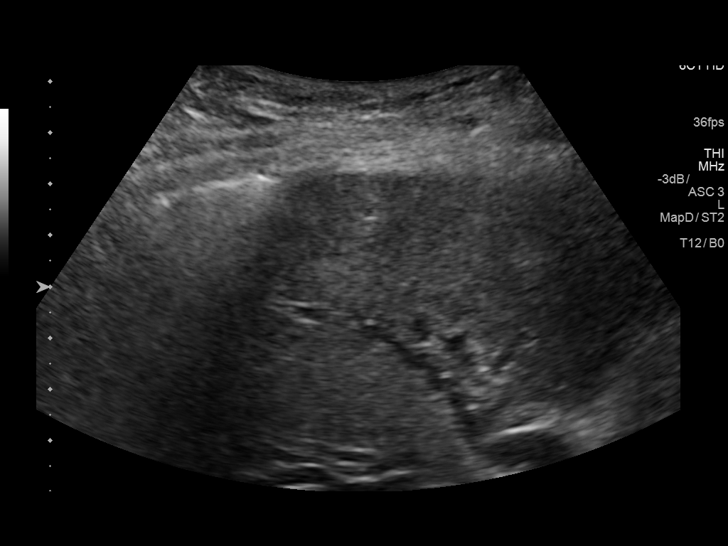
[im 35/39]
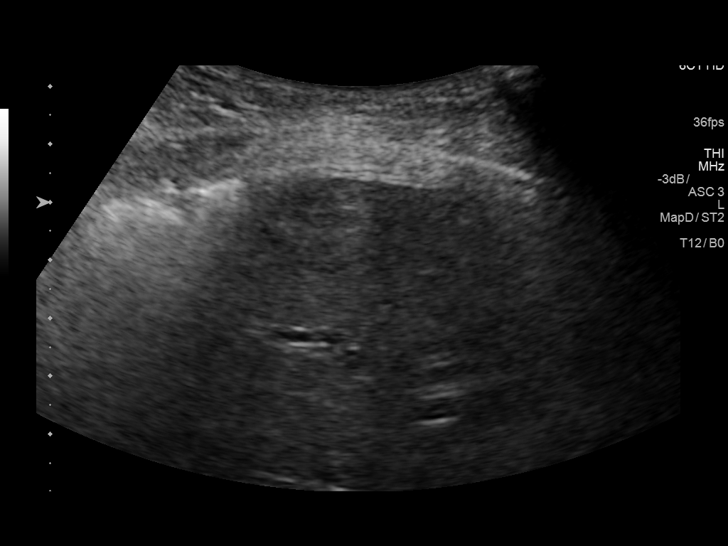
[im 39/39]
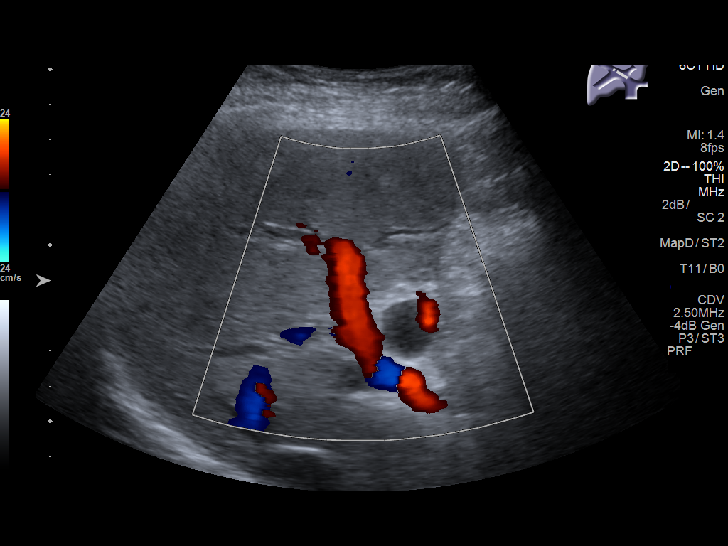

[14 of 25 positions shown; findings below may reference images not displayed]

FINDINGS: Gallbladder:

Surgically absent.

Common bile duct:

Diameter: Intrahepatic biliary duct dilatation associated with
cm diameter common duct.

Liver:

Echogenic liver parenchyma with focal liver lesions again noted.
IMPRESSION: No substantial change. Intra and extrahepatic biliary duct
dilatation with multiple liver masses evident.
